# Patient Record
Sex: Female | Born: 1935 | Race: White | Hispanic: No | Marital: Married | Smoking: Never smoker
Health system: Southern US, Community
[De-identification: ages and names within clinical notes are randomized; demographics above are authoritative.]

## PROBLEM LIST (undated history)

## (undated) DIAGNOSIS — G459 Transient cerebral ischemic attack, unspecified: Secondary | ICD-10-CM

## (undated) DIAGNOSIS — J310 Chronic rhinitis: Secondary | ICD-10-CM

## (undated) DIAGNOSIS — D801 Nonfamilial hypogammaglobulinemia: Principal | ICD-10-CM

## (undated) DIAGNOSIS — K589 Irritable bowel syndrome without diarrhea: Secondary | ICD-10-CM

## (undated) DIAGNOSIS — D696 Thrombocytopenia, unspecified: Secondary | ICD-10-CM

## (undated) DIAGNOSIS — E785 Hyperlipidemia, unspecified: Secondary | ICD-10-CM

## (undated) DIAGNOSIS — K219 Gastro-esophageal reflux disease without esophagitis: Secondary | ICD-10-CM

## (undated) DIAGNOSIS — R410 Disorientation, unspecified: Principal | ICD-10-CM

## (undated) DIAGNOSIS — C9590 Leukemia, unspecified not having achieved remission: Secondary | ICD-10-CM

## (undated) DIAGNOSIS — D72819 Decreased white blood cell count, unspecified: Secondary | ICD-10-CM

## (undated) DIAGNOSIS — C9110 Chronic lymphocytic leukemia of B-cell type not having achieved remission: Principal | ICD-10-CM

## (undated) DIAGNOSIS — I10 Essential (primary) hypertension: Secondary | ICD-10-CM

## (undated) HISTORY — DX: Nonfamilial hypogammaglobulinemia: D80.1

## (undated) HISTORY — DX: Transient cerebral ischemic attack, unspecified: G45.9

## (undated) HISTORY — PX: VAGINAL HYSTERECTOMY: SUR661

## (undated) HISTORY — DX: Chronic lymphocytic leukemia of B-cell type not having achieved remission: C91.10

## (undated) HISTORY — DX: Thrombocytopenia, unspecified: D69.6

## (undated) HISTORY — PX: OTHER SURGICAL HISTORY: SHX169

## (undated) HISTORY — DX: Gastro-esophageal reflux disease without esophagitis: K21.9

## (undated) HISTORY — PX: ANTERIOR AND POSTERIOR VAGINAL REPAIR: SUR5

## (undated) HISTORY — PX: TONSILLECTOMY AND ADENOIDECTOMY: SUR1326

## (undated) HISTORY — DX: Hyperlipidemia, unspecified: E78.5

## (undated) HISTORY — PX: HEMORRHOID SURGERY: SHX153

## (undated) HISTORY — DX: Essential (primary) hypertension: I10

## (undated) HISTORY — PX: LAPAROSCOPIC CHOLECYSTECTOMY: SUR755

## (undated) HISTORY — DX: Leukemia, unspecified not having achieved remission: C95.90

## (undated) HISTORY — DX: Chronic rhinitis: J31.0

## (undated) HISTORY — DX: Disorientation, unspecified: R41.0

## (undated) HISTORY — DX: Irritable bowel syndrome, unspecified: K58.9

## (undated) HISTORY — DX: Decreased white blood cell count, unspecified: D72.819

---

## 2003-04-11 ENCOUNTER — Encounter: Payer: Self-pay | Admitting: Internal Medicine

## 2003-04-11 ENCOUNTER — Encounter: Payer: Self-pay | Admitting: Gastroenterology

## 2004-06-18 ENCOUNTER — Encounter: Payer: Self-pay | Admitting: Internal Medicine

## 2004-06-18 ENCOUNTER — Encounter: Payer: Self-pay | Admitting: Gastroenterology

## 2006-01-02 ENCOUNTER — Ambulatory Visit (HOSPITAL_COMMUNITY): Admission: RE | Admit: 2006-01-02 | Discharge: 2006-01-02 | Payer: Self-pay | Admitting: Internal Medicine

## 2007-01-05 ENCOUNTER — Ambulatory Visit (HOSPITAL_COMMUNITY): Admission: RE | Admit: 2007-01-05 | Discharge: 2007-01-05 | Payer: Self-pay | Admitting: Internal Medicine

## 2008-01-07 ENCOUNTER — Ambulatory Visit (HOSPITAL_COMMUNITY): Admission: RE | Admit: 2008-01-07 | Discharge: 2008-01-07 | Payer: Self-pay | Admitting: Internal Medicine

## 2009-01-10 ENCOUNTER — Ambulatory Visit (HOSPITAL_COMMUNITY): Admission: RE | Admit: 2009-01-10 | Discharge: 2009-01-10 | Payer: Self-pay | Admitting: Internal Medicine

## 2009-08-01 ENCOUNTER — Ambulatory Visit: Payer: Self-pay | Admitting: Pulmonary Disease

## 2009-08-01 ENCOUNTER — Inpatient Hospital Stay (HOSPITAL_COMMUNITY): Admission: AD | Admit: 2009-08-01 | Discharge: 2009-08-07 | Payer: Self-pay | Admitting: Internal Medicine

## 2009-08-03 ENCOUNTER — Encounter: Payer: Self-pay | Admitting: Pulmonary Disease

## 2009-08-06 ENCOUNTER — Encounter: Payer: Self-pay | Admitting: Pulmonary Disease

## 2009-08-06 ENCOUNTER — Ambulatory Visit: Payer: Self-pay | Admitting: Oncology

## 2009-08-06 ENCOUNTER — Encounter (HOSPITAL_BASED_OUTPATIENT_CLINIC_OR_DEPARTMENT_OTHER): Payer: Self-pay | Admitting: Internal Medicine

## 2009-08-06 DIAGNOSIS — C9590 Leukemia, unspecified not having achieved remission: Secondary | ICD-10-CM

## 2009-08-06 HISTORY — DX: Leukemia, unspecified not having achieved remission: C95.90

## 2009-08-08 ENCOUNTER — Ambulatory Visit: Payer: Self-pay | Admitting: Oncology

## 2009-08-13 ENCOUNTER — Encounter: Payer: Self-pay | Admitting: Pulmonary Disease

## 2009-08-13 LAB — COMPREHENSIVE METABOLIC PANEL
Albumin: 4.2 g/dL (ref 3.5–5.2)
BUN: 18 mg/dL (ref 6–23)
CO2: 26 mEq/L (ref 19–32)
Calcium: 10.4 mg/dL (ref 8.4–10.5)
Chloride: 98 mEq/L (ref 96–112)
Creatinine, Ser: 1.06 mg/dL (ref 0.40–1.20)
Potassium: 4 mEq/L (ref 3.5–5.3)

## 2009-08-13 LAB — LACTATE DEHYDROGENASE: LDH: 170 U/L (ref 94–250)

## 2009-08-13 LAB — CBC WITH DIFFERENTIAL/PLATELET
Basophils Absolute: 1 10*3/uL — ABNORMAL HIGH (ref 0.0–0.1)
Eosinophils Absolute: 0.1 10*3/uL (ref 0.0–0.5)
HCT: 39 % (ref 34.8–46.6)
HGB: 12.9 g/dL (ref 11.6–15.9)
LYMPH%: 68.3 % — ABNORMAL HIGH (ref 14.0–49.7)
MCHC: 32.9 g/dL (ref 31.5–36.0)
MONO#: 0.8 10*3/uL (ref 0.1–0.9)
NEUT#: 25.2 10*3/uL — ABNORMAL HIGH (ref 1.5–6.5)
NEUT%: 29.4 % — ABNORMAL LOW (ref 38.4–76.8)
Platelets: 329 10*3/uL (ref 145–400)
WBC: 85.6 10*3/uL (ref 3.9–10.3)
lymph#: 58.5 10*3/uL — ABNORMAL HIGH (ref 0.9–3.3)

## 2009-08-20 LAB — CBC WITH DIFFERENTIAL/PLATELET
Basophils Absolute: 0.1 10*3/uL (ref 0.0–0.1)
EOS%: 1.3 % (ref 0.0–7.0)
Eosinophils Absolute: 0.5 10*3/uL (ref 0.0–0.5)
HGB: 11.9 g/dL (ref 11.6–15.9)
MCH: 27.1 pg (ref 25.1–34.0)
MONO%: 4.1 % (ref 0.0–14.0)
NEUT#: 9 10*3/uL — ABNORMAL HIGH (ref 1.5–6.5)
RBC: 4.38 10*6/uL (ref 3.70–5.45)
RDW: 17 % — ABNORMAL HIGH (ref 11.2–14.5)
lymph#: 26.1 10*3/uL — ABNORMAL HIGH (ref 0.9–3.3)

## 2009-08-27 LAB — CBC WITH DIFFERENTIAL/PLATELET
Basophils Absolute: 0.1 10*3/uL (ref 0.0–0.1)
Eosinophils Absolute: 0.7 10*3/uL — ABNORMAL HIGH (ref 0.0–0.5)
HGB: 12.4 g/dL (ref 11.6–15.9)
MCV: 83.6 fL (ref 79.5–101.0)
MONO#: 1.2 10*3/uL — ABNORMAL HIGH (ref 0.1–0.9)
MONO%: 4.1 % (ref 0.0–14.0)
NEUT#: 7.2 10*3/uL — ABNORMAL HIGH (ref 1.5–6.5)
RBC: 4.53 10*6/uL (ref 3.70–5.45)
RDW: 18.7 % — ABNORMAL HIGH (ref 11.2–14.5)
WBC: 28.3 10*3/uL — ABNORMAL HIGH (ref 3.9–10.3)

## 2009-08-27 LAB — TECHNOLOGIST REVIEW

## 2009-09-03 ENCOUNTER — Encounter: Payer: Self-pay | Admitting: Pulmonary Disease

## 2009-09-03 LAB — COMPREHENSIVE METABOLIC PANEL
ALT: 21 U/L (ref 0–35)
AST: 23 U/L (ref 0–37)
CO2: 27 mEq/L (ref 19–32)
Creatinine, Ser: 1.07 mg/dL (ref 0.40–1.20)
Total Bilirubin: 0.6 mg/dL (ref 0.3–1.2)

## 2009-09-03 LAB — IGG, IGA, IGM
IgG (Immunoglobin G), Serum: 364 mg/dL — ABNORMAL LOW (ref 694–1618)
IgM, Serum: 20 mg/dL — ABNORMAL LOW (ref 60–263)

## 2009-09-03 LAB — CBC WITH DIFFERENTIAL/PLATELET
BASO%: 0.4 % (ref 0.0–2.0)
LYMPH%: 66.8 % — ABNORMAL HIGH (ref 14.0–49.7)
MCHC: 32.8 g/dL (ref 31.5–36.0)
MCV: 83.9 fL (ref 79.5–101.0)
MONO%: 5.8 % (ref 0.0–14.0)
Platelets: 186 10*3/uL (ref 145–400)
RBC: 4.33 10*6/uL (ref 3.70–5.45)

## 2009-09-03 LAB — LACTATE DEHYDROGENASE: LDH: 146 U/L (ref 94–250)

## 2009-09-19 ENCOUNTER — Encounter: Admission: RE | Admit: 2009-09-19 | Discharge: 2009-09-19 | Payer: Self-pay | Admitting: Internal Medicine

## 2009-09-25 ENCOUNTER — Ambulatory Visit: Payer: Self-pay | Admitting: Oncology

## 2009-10-01 LAB — CBC WITH DIFFERENTIAL/PLATELET
Eosinophils Absolute: 0.5 10*3/uL (ref 0.0–0.5)
LYMPH%: 55.2 % — ABNORMAL HIGH (ref 14.0–49.7)
MONO#: 1.1 10*3/uL — ABNORMAL HIGH (ref 0.1–0.9)
NEUT#: 6.5 10*3/uL (ref 1.5–6.5)
Platelets: 213 10*3/uL (ref 145–400)
RBC: 4.46 10*6/uL (ref 3.70–5.45)
WBC: 18.2 10*3/uL — ABNORMAL HIGH (ref 3.9–10.3)
lymph#: 10.1 10*3/uL — ABNORMAL HIGH (ref 0.9–3.3)

## 2009-10-10 ENCOUNTER — Emergency Department (HOSPITAL_COMMUNITY): Admission: EM | Admit: 2009-10-10 | Discharge: 2009-10-10 | Payer: Self-pay | Admitting: Emergency Medicine

## 2009-11-05 ENCOUNTER — Ambulatory Visit: Payer: Self-pay | Admitting: Oncology

## 2009-11-08 ENCOUNTER — Encounter: Payer: Self-pay | Admitting: Pulmonary Disease

## 2009-11-08 ENCOUNTER — Ambulatory Visit: Payer: Self-pay | Admitting: Oncology

## 2009-11-08 LAB — COMPREHENSIVE METABOLIC PANEL
Alkaline Phosphatase: 67 U/L (ref 39–117)
BUN: 15 mg/dL (ref 6–23)
Calcium: 9.4 mg/dL (ref 8.4–10.5)
Chloride: 104 mEq/L (ref 96–112)
Glucose, Bld: 92 mg/dL (ref 70–99)
Potassium: 4.4 mEq/L (ref 3.5–5.3)
Sodium: 141 mEq/L (ref 135–145)
Total Bilirubin: 0.4 mg/dL (ref 0.3–1.2)

## 2009-11-08 LAB — LACTATE DEHYDROGENASE: LDH: 159 U/L (ref 94–250)

## 2009-11-08 LAB — CBC WITH DIFFERENTIAL/PLATELET
EOS%: 2.6 % (ref 0.0–7.0)
Eosinophils Absolute: 0.4 10*3/uL (ref 0.0–0.5)
LYMPH%: 52.8 % — ABNORMAL HIGH (ref 14.0–49.7)
MCHC: 33.2 g/dL (ref 31.5–36.0)
MONO#: 1.1 10*3/uL — ABNORMAL HIGH (ref 0.1–0.9)
MONO%: 7.1 % (ref 0.0–14.0)
NEUT#: 6 10*3/uL (ref 1.5–6.5)
WBC: 16.1 10*3/uL — ABNORMAL HIGH (ref 3.9–10.3)
lymph#: 8.5 10*3/uL — ABNORMAL HIGH (ref 0.9–3.3)

## 2009-12-17 ENCOUNTER — Ambulatory Visit: Payer: Self-pay | Admitting: Oncology

## 2009-12-20 LAB — CBC WITH DIFFERENTIAL/PLATELET
Basophils Absolute: 0.1 10*3/uL (ref 0.0–0.1)
Eosinophils Absolute: 0.3 10*3/uL (ref 0.0–0.5)
HCT: 40 % (ref 34.8–46.6)
HGB: 13.4 g/dL (ref 11.6–15.9)
LYMPH%: 58.8 % — ABNORMAL HIGH (ref 14.0–49.7)
MCHC: 33.4 g/dL (ref 31.5–36.0)
MONO#: 1.1 10*3/uL — ABNORMAL HIGH (ref 0.1–0.9)
NEUT#: 6.7 10*3/uL — ABNORMAL HIGH (ref 1.5–6.5)
NEUT%: 33.8 % — ABNORMAL LOW (ref 38.4–76.8)
Platelets: 146 10*3/uL (ref 145–400)
WBC: 19.9 10*3/uL — ABNORMAL HIGH (ref 3.9–10.3)

## 2010-01-11 ENCOUNTER — Ambulatory Visit (HOSPITAL_COMMUNITY): Admission: RE | Admit: 2010-01-11 | Discharge: 2010-01-11 | Payer: Self-pay | Admitting: Internal Medicine

## 2010-02-05 ENCOUNTER — Ambulatory Visit: Payer: Self-pay | Admitting: Oncology

## 2010-02-07 ENCOUNTER — Encounter (INDEPENDENT_AMBULATORY_CARE_PROVIDER_SITE_OTHER): Payer: Self-pay | Admitting: *Deleted

## 2010-02-07 ENCOUNTER — Encounter: Payer: Self-pay | Admitting: Pulmonary Disease

## 2010-02-07 LAB — CBC WITH DIFFERENTIAL/PLATELET
Basophils Absolute: 0.2 10*3/uL — ABNORMAL HIGH (ref 0.0–0.1)
Eosinophils Absolute: 0.2 10*3/uL (ref 0.0–0.5)
HCT: 37.9 % (ref 34.8–46.6)
HGB: 12.7 g/dL (ref 11.6–15.9)
MCH: 28.2 pg (ref 25.1–34.0)
MCV: 84.1 fL (ref 79.5–101.0)
MONO#: 1.1 10*3/uL — ABNORMAL HIGH (ref 0.1–0.9)
Platelets: 131 10*3/uL — ABNORMAL LOW (ref 145–400)
RBC: 4.51 10*6/uL (ref 3.70–5.45)
RDW: 17.1 % — ABNORMAL HIGH (ref 11.2–14.5)

## 2010-02-07 LAB — COMPREHENSIVE METABOLIC PANEL
Albumin: 4.5 g/dL (ref 3.5–5.2)
Alkaline Phosphatase: 45 U/L (ref 39–117)
CO2: 25 mEq/L (ref 19–32)
Calcium: 9.2 mg/dL (ref 8.4–10.5)
Chloride: 96 mEq/L (ref 96–112)
Sodium: 131 mEq/L — ABNORMAL LOW (ref 135–145)

## 2010-02-11 ENCOUNTER — Ambulatory Visit: Payer: Self-pay | Admitting: Internal Medicine

## 2010-02-12 ENCOUNTER — Ambulatory Visit: Payer: Self-pay | Admitting: Internal Medicine

## 2010-02-12 DIAGNOSIS — R195 Other fecal abnormalities: Secondary | ICD-10-CM | POA: Insufficient documentation

## 2010-02-12 DIAGNOSIS — K219 Gastro-esophageal reflux disease without esophagitis: Secondary | ICD-10-CM

## 2010-02-12 DIAGNOSIS — K589 Irritable bowel syndrome without diarrhea: Secondary | ICD-10-CM | POA: Insufficient documentation

## 2010-03-01 ENCOUNTER — Ambulatory Visit: Payer: Self-pay | Admitting: Internal Medicine

## 2010-03-20 ENCOUNTER — Ambulatory Visit: Payer: Self-pay | Admitting: Oncology

## 2010-03-21 ENCOUNTER — Encounter (INDEPENDENT_AMBULATORY_CARE_PROVIDER_SITE_OTHER): Payer: Self-pay | Admitting: *Deleted

## 2010-03-21 LAB — CBC WITH DIFFERENTIAL/PLATELET
BASO%: 0.9 % (ref 0.0–2.0)
Basophils Absolute: 0.2 10*3/uL — ABNORMAL HIGH (ref 0.0–0.1)
Eosinophils Absolute: 0.2 10*3/uL (ref 0.0–0.5)
HGB: 13.4 g/dL (ref 11.6–15.9)
LYMPH%: 72.6 % — ABNORMAL HIGH (ref 14.0–49.7)
MCHC: 34.2 g/dL (ref 31.5–36.0)
MONO#: 0.7 10*3/uL (ref 0.1–0.9)
NEUT#: 5.4 10*3/uL (ref 1.5–6.5)
NEUT%: 22.7 % — ABNORMAL LOW (ref 38.4–76.8)
Platelets: 137 10*3/uL — ABNORMAL LOW (ref 145–400)
RDW: 15.9 % — ABNORMAL HIGH (ref 11.2–14.5)
WBC: 23.6 10*3/uL — ABNORMAL HIGH (ref 3.9–10.3)
lymph#: 17.1 10*3/uL — ABNORMAL HIGH (ref 0.9–3.3)

## 2010-03-25 ENCOUNTER — Ambulatory Visit: Payer: Self-pay | Admitting: Internal Medicine

## 2010-04-04 ENCOUNTER — Ambulatory Visit: Payer: Self-pay | Admitting: Internal Medicine

## 2010-04-06 ENCOUNTER — Encounter: Payer: Self-pay | Admitting: Internal Medicine

## 2010-04-09 ENCOUNTER — Telehealth (INDEPENDENT_AMBULATORY_CARE_PROVIDER_SITE_OTHER): Payer: Self-pay | Admitting: *Deleted

## 2010-04-09 ENCOUNTER — Encounter (INDEPENDENT_AMBULATORY_CARE_PROVIDER_SITE_OTHER): Payer: Self-pay | Admitting: *Deleted

## 2010-04-09 DIAGNOSIS — R933 Abnormal findings on diagnostic imaging of other parts of digestive tract: Secondary | ICD-10-CM

## 2010-04-29 ENCOUNTER — Ambulatory Visit: Payer: Self-pay | Admitting: Oncology

## 2010-04-30 ENCOUNTER — Encounter: Payer: Self-pay | Admitting: Pulmonary Disease

## 2010-04-30 LAB — TECHNOLOGIST REVIEW

## 2010-04-30 LAB — COMPREHENSIVE METABOLIC PANEL
Albumin: 4.5 g/dL (ref 3.5–5.2)
Alkaline Phosphatase: 47 U/L (ref 39–117)
BUN: 16 mg/dL (ref 6–23)
CO2: 26 mEq/L (ref 19–32)
Calcium: 10 mg/dL (ref 8.4–10.5)
Potassium: 4.5 mEq/L (ref 3.5–5.3)
Total Bilirubin: 0.7 mg/dL (ref 0.3–1.2)

## 2010-04-30 LAB — CBC WITH DIFFERENTIAL/PLATELET
HGB: 13.8 g/dL (ref 11.6–15.9)
LYMPH%: 77 % — ABNORMAL HIGH (ref 14.0–49.7)
MCHC: 34.3 g/dL (ref 31.5–36.0)
MCV: 86.1 fL (ref 79.5–101.0)
MONO%: 4.3 % (ref 0.0–14.0)
NEUT%: 17.4 % — ABNORMAL LOW (ref 38.4–76.8)
Platelets: 123 10*3/uL — ABNORMAL LOW (ref 145–400)
RDW: 14.3 % (ref 11.2–14.5)

## 2010-04-30 LAB — LACTATE DEHYDROGENASE: LDH: 143 U/L (ref 94–250)

## 2010-06-07 ENCOUNTER — Ambulatory Visit: Payer: Self-pay | Admitting: Oncology

## 2010-06-11 LAB — CBC WITH DIFFERENTIAL/PLATELET
BASO%: 0.3 % (ref 0.0–2.0)
Basophils Absolute: 0.1 10*3/uL (ref 0.0–0.1)
EOS%: 0.8 % (ref 0.0–7.0)
HCT: 39.3 % (ref 34.8–46.6)
LYMPH%: 76.7 % — ABNORMAL HIGH (ref 14.0–49.7)
MCH: 30.1 pg (ref 25.1–34.0)
MCHC: 34.7 g/dL (ref 31.5–36.0)
MCV: 86.9 fL (ref 79.5–101.0)
MONO#: 1.4 10*3/uL — ABNORMAL HIGH (ref 0.1–0.9)
MONO%: 4.3 % (ref 0.0–14.0)
NEUT#: 5.7 10*3/uL (ref 1.5–6.5)
NEUT%: 17.9 % — ABNORMAL LOW (ref 38.4–76.8)
Platelets: 120 10*3/uL — ABNORMAL LOW (ref 145–400)
RBC: 4.53 10*6/uL (ref 3.70–5.45)
lymph#: 24.6 10*3/uL — ABNORMAL HIGH (ref 0.9–3.3)

## 2010-06-11 LAB — TECHNOLOGIST REVIEW

## 2010-08-06 ENCOUNTER — Ambulatory Visit: Payer: Self-pay | Admitting: Oncology

## 2010-08-08 ENCOUNTER — Encounter: Payer: Self-pay | Admitting: Pulmonary Disease

## 2010-08-08 LAB — COMPREHENSIVE METABOLIC PANEL
ALT: 18 U/L (ref 0–35)
AST: 24 U/L (ref 0–37)
Albumin: 4.3 g/dL (ref 3.5–5.2)
Alkaline Phosphatase: 43 U/L (ref 39–117)
BUN: 21 mg/dL (ref 6–23)
Calcium: 9.8 mg/dL (ref 8.4–10.5)
Total Bilirubin: 0.6 mg/dL (ref 0.3–1.2)
Total Protein: 5.5 g/dL — ABNORMAL LOW (ref 6.0–8.3)

## 2010-08-08 LAB — CBC WITH DIFFERENTIAL/PLATELET
BASO%: 0.4 % (ref 0.0–2.0)
EOS%: 0.8 % (ref 0.0–7.0)
HGB: 13.1 g/dL (ref 11.6–15.9)
MCHC: 33.8 g/dL (ref 31.5–36.0)
MONO#: 1.3 10*3/uL — ABNORMAL HIGH (ref 0.1–0.9)
lymph#: 23.3 10*3/uL — ABNORMAL HIGH (ref 0.9–3.3)

## 2010-11-05 ENCOUNTER — Encounter
Admission: RE | Admit: 2010-11-05 | Discharge: 2010-11-05 | Payer: Self-pay | Source: Home / Self Care | Attending: Obstetrics and Gynecology | Admitting: Obstetrics and Gynecology

## 2010-11-06 ENCOUNTER — Ambulatory Visit: Payer: Self-pay | Admitting: Oncology

## 2010-11-07 ENCOUNTER — Encounter: Payer: Self-pay | Admitting: Pulmonary Disease

## 2010-11-07 LAB — MANUAL DIFFERENTIAL
ALC: 32 10*3/uL — ABNORMAL HIGH (ref 0.9–3.3)
ANC (CHCC manual diff): 2.8 10*3/uL (ref 1.5–6.5)
Band Neutrophils: 0 % (ref 0–10)
Basophil: 0 % (ref 0–2)
Blasts: 0 % (ref 0–0)
EOS: 2 % (ref 0–7)
LYMPH: 90 % — ABNORMAL HIGH (ref 14–49)
MONO: 0 % (ref 0–14)
Metamyelocytes: 0 % (ref 0–0)
Myelocytes: 0 % (ref 0–0)
Other Cell: 0 % (ref 0–0)
PLT EST: DECREASED
PROMYELO: 0 % (ref 0–0)
SEG: 8 % — ABNORMAL LOW (ref 38–77)
Variant Lymph: 0 % (ref 0–0)
nRBC: 0 % (ref 0–0)

## 2010-11-07 LAB — CBC WITH DIFFERENTIAL/PLATELET
HCT: 39.3 % (ref 34.8–46.6)
HGB: 13.4 g/dL (ref 11.6–15.9)
MCH: 29.3 pg (ref 25.1–34.0)
MCHC: 34.1 g/dL (ref 31.5–36.0)
MCV: 86 fL (ref 79.5–101.0)
Platelets: 112 10*3/uL — ABNORMAL LOW (ref 145–400)
RBC: 4.57 10*6/uL (ref 3.70–5.45)
RDW: 13.9 % (ref 11.2–14.5)
WBC: 35.5 10*3/uL — ABNORMAL HIGH (ref 3.9–10.3)

## 2010-11-07 LAB — COMPREHENSIVE METABOLIC PANEL
ALT: 22 U/L (ref 0–35)
AST: 24 U/L (ref 0–37)
Albumin: 4.8 g/dL (ref 3.5–5.2)
Alkaline Phosphatase: 58 U/L (ref 39–117)
BUN: 20 mg/dL (ref 6–23)
CO2: 27 mEq/L (ref 19–32)
Calcium: 10.2 mg/dL (ref 8.4–10.5)
Chloride: 100 mEq/L (ref 96–112)
Creatinine, Ser: 1.01 mg/dL (ref 0.40–1.20)
Glucose, Bld: 84 mg/dL (ref 70–99)
Potassium: 4.1 mEq/L (ref 3.5–5.3)
Sodium: 138 mEq/L (ref 135–145)
Total Bilirubin: 0.7 mg/dL (ref 0.3–1.2)
Total Protein: 6.5 g/dL (ref 6.0–8.3)

## 2010-11-07 LAB — IGG, IGA, IGM
IgA: 8 mg/dL — ABNORMAL LOW (ref 68–378)
IgG (Immunoglobin G), Serum: 344 mg/dL — ABNORMAL LOW (ref 694–1618)
IgM, Serum: 11 mg/dL — ABNORMAL LOW (ref 60–263)

## 2010-11-07 LAB — LACTATE DEHYDROGENASE: LDH: 137 U/L (ref 94–250)

## 2010-12-03 NOTE — Letter (Signed)
Summary: EGD Instructions  [facility]  [address]   [place], [place] [address]   Phone: [phone]  Fax: [phone]       Angelica Sullivan    03/25/1933    MRN: [id number]       Procedure Day Dorna Bloom: Thursday, 04-04-10     Arrival Time:  10:00 a.m.     Procedure Time: 11:00 a.m.     Location of Procedure:                    x   [facility] (4th Floor)   PREPARATION FOR ENDOSCOPY   On 04-04-10  THE DAY OF THE PROCEDURE:  1.   No solid foods, milk or milk products are allowed after midnight the night before your procedure.  2.   Do not drink anything colored red or purple.  Avoid juices with pulp.  No orange juice.  3.  You may drink clear liquids until 9:00 a.m., which is 2 hours before your procedure.                                                                                                CLEAR LIQUIDS INCLUDE: Water Jello Ice Popsicles Tea (sugar ok, no milk/cream) Powdered fruit flavored drinks Coffee (sugar ok, no milk/cream) Gatorade Juice: apple, white grape, white cranberry  Lemonade Clear bullion, consomm, broth Carbonated beverages (any kind) Strained chicken noodle soup Hard Candy   MEDICATION INSTRUCTIONS  Unless otherwise instructed, you should take regular prescription medications with a small sip of water as early as possible the morning of your procedure.   Additional medication instructions: hold maxide morning of procedure             OTHER INSTRUCTIONS  You will need a responsible adult at least 75 years of age to accompany you and drive you home.   This person must remain in the waiting room during your procedure.  Wear loose fitting clothing that is easily removed.  Leave jewelry and other valuables at home.  However, you may wish to bring a book to read or an iPod/MP3 player to listen to music as you wait for your procedure to start.  Remove all body piercing jewelry and leave at home.  Total time  from sign-in until discharge is approximately 2-3 hours.  You should go home directly after your procedure and rest.  You can resume normal activities the day after your procedure.  The day of your procedure you should not:   Drive   Make legal decisions   Operate machinery   Drink alcohol   Return to work  You will receive specific instructions about eating, activities and medications before you leave.    The above instructions have been reviewed and explained to me by   Sherren Kerns RN  Mar 25, 2010 1:49 PM     I fully understand and can verbalize these instructions _____________________________ Date _________

## 2010-12-03 NOTE — Assessment & Plan Note (Signed)
Summary: heme positive stool   History of Present Illness Visit Type: Initial Visit Primary GI MD: Yancey Flemings MD Primary Provider: Creola Corn, MD Chief Complaint: heme positive stool History of Present Illness:   77 year old female with chronic lymphocytic leukemia, fibromyalgia, hypertension, hyperlipidemia, hypogammaglobulinemia, GERD, and irritable bowel. She said today regarding Hemoccult-positive stool identified as part of a routine physical examination January 23, 2000. Review of outside blood work from March 14 reveals a hemoglobin of 14.0. White blood cell count 14.5. Blood counts obtained February 07, 2010 revealed a hemoglobin of 12.7, white blood cell count 18.3, and platelets 131,000. The patient reports having had prior colonoscopies but none more recently than 2004. These were performed elsewhere. She denies any abnormalities. No records available to review. No prior upper endoscopy. Prior to submit her stool studies she did have some transient diarrhea which has resolved. Her IBS is been under good control. She does have regurgitation and pyrosis but is on no PPI therapy. She denies aspirin or significant NSAID use. She denies melena or hematochezia. No weight loss or abdominal pain. Her multiple chronic medical problems are stable.   GI Review of Systems    Reports acid reflux, bloating, loss of appetite, and  weight loss.      Denies abdominal pain, belching, chest pain, dysphagia with liquids, dysphagia with solids, heartburn, nausea, vomiting, vomiting blood, and  weight gain.      Reports diarrhea, heme positive stool, hemorrhoids, and  irritable bowel syndrome.     Denies anal fissure, black tarry stools, change in bowel habit, constipation, diverticulosis, fecal incontinence, jaundice, light color stool, liver problems, rectal bleeding, and  rectal pain. Preventive Screening-Counseling & Management  Alcohol-Tobacco     Smoking Status: never      Drug Use:  no.      Current  Medications (verified): 1)  Zolpidem Tartrate 10 Mg Tabs (Zolpidem Tartrate) .Marland Kitchen.. 1 Tablet By Mouth Once Daily 2)  Claritin 10 Mg Tabs (Loratadine) .Marland Kitchen.. 1 By Mouth As Needed 3)  Glucosamine-Chondroitin 1500-1200 Mg/75ml Liqd (Glucosamine-Chondroitin) .Marland Kitchen.. 1 By Mouth Once Daily 4)  Lipitor 20 Mg Tabs (Atorvastatin Calcium) .Marland Kitchen.. 1 Tablet By Mouth Once Daily 5)  Maxzide-25 37.5-25 Mg Tabs (Triamterene-Hctz) .Marland Kitchen.. 1 Tablet By Mouth Once Daily 6)  Multivitamins  Tabs (Multiple Vitamin) .Marland Kitchen.. 1 Tablet By Mouth Once Daily 7)  Fish Oil 1000 Mg Caps (Omega-3 Fatty Acids) .... 2 By Mouth Once Daily 8)  Singulair 10 Mg Tabs (Montelukast Sodium) .Marland Kitchen.. 1 Tablet By Mouth Once Daily 9)  Toprol Xl 25 Mg Xr24h-Tab (Metoprolol Succinate) .... 1/2 By Mouth Once Daily 10)  Vitamin E 400 Unit Caps (Vitamin E) .Marland Kitchen.. 1 By Mouth Once Daily 11)  Zanaflex 4 Mg Tabs (Tizanidine Hcl) .Marland Kitchen.. 1 By Mouth 3 Times Per Week As Needed  Allergies (verified): No Known Drug Allergies  Past History:  Past Medical History: CLL Hypogammaglobulinemia Fibromyalgia Hyperlipidemia Hypertension Irritable Bowel Syndrome Pneumonia Urinary Tract Infection  Past Surgical History: Cholecystectomy Hysterectomy  Family History: No FH of Colon Cancer: Family History of Liver Disease/Cirrhosis:Father Pancreatits: Mother  Social History: Occupation: Retired Engineer, civil (consulting) Patient has never smoked.  Alcohol Use - no Daily Caffeine Use Illicit Drug Use - no Smoking Status:  never Drug Use:  no  Review of Systems       The patient complains of allergy/sinus, cough, headaches-new, and sleeping problems.  The patient denies anemia, anxiety-new, arthritis/joint pain, back pain, blood in urine, breast changes/lumps, change in vision, confusion, coughing up blood,  depression-new, fainting, fatigue, fever, hearing problems, heart murmur, heart rhythm changes, itching, menstrual pain, muscle pains/cramps, night sweats, nosebleeds, pregnancy  symptoms, shortness of breath, skin rash, sore throat, swelling of feet/legs, swollen lymph glands, thirst - excessive , urination - excessive , urination changes/pain, urine leakage, vision changes, and voice change.    Vital Signs:  Patient profile:   77 year old female Height:      60 inches Weight:      136.25 pounds BMI:     26.71 Pulse rate:   68 / minute Pulse rhythm:   regular BP sitting:   152 / 58  (left arm) Cuff size:   regular  Vitals Entered By: June McMurray CMA Duncan Dull) (February 12, 2010 2:34 PM)  Physical Exam  General:  Well developed, well nourished, no acute distress. Head:  Normocephalic and atraumatic. Eyes:  PERRLA, no icterus. Nose:  No deformity, discharge,  or lesions. Mouth:  No deformity or lesions Neck:  Supple; no masses or thyromegaly. Lungs:  Clear throughout to auscultation. Heart:  Regular rate and rhythm; no murmurs, rubs,  or bruits. Abdomen:  Soft, nontender and nondistended. No masses, hepatosplenomegaly or hernias noted. Normal bowel sounds. Rectal:  deferred until colonoscopy Msk:  Symmetrical with no gross deformities. Normal posture. Pulses:  Normal pulses noted. Extremities:  No clubbing, cyanosis, edema or deformities noted. Neurologic:  Alert and  oriented x4;  grossly normal neurologically. Skin:  Intact without significant lesions or rashes. Cervical Nodes:  No significant cervical adenopathy.no supraclavicular adenopathy Psych:  Alert and cooperative. Normal mood and affect.   Impression & Recommendations:  Problem # 1:  FECAL OCCULT BLOOD (ICD-89.22) 77 year old with multiple medical problems who is sent for Hemoccult positive stool. GI review of systems remarkable for GERD and IBS. Reports negative colonoscopy around 2004.  Plan: #1. Colonoscopy to evaluate Hemoccult-positive stool. The nature of the procedure as well as the risks, benefits, and alternatives have been reviewed. She understood and agreed to proceed #2. Movi prep  prescribed. The patient instructed on its use #3. Obtain outside records from previous GI Dr. The patient has signed a release form. If these records arrive, I will review them. #4. Consider upper endoscopy if colonoscopy negative  Problem # 2:  GERD (ICD-530.81) intermittent GERD symptoms. On no regular acid suppressive therapy.  Plan: #1. Reflux precautions #2. Consider OTC H2 receptor antagonist or PPI on demand p.r.n. #3. Consider upper endoscopy if colonoscopy negative  Problem # 3:  IRRITABLE BOWEL SYNDROME (ICD-564.1) really no significant IBS-type symptoms of recent. Observation without additional recommendations at this time  Patient Instructions: 1)  Colonoscopy was already scheduled for 03/01/10 at 3:00 pm 2)  Movi prep Rx. was sent today to pt.'s pharmacy for pickup. 3)  The medication list was reviewed and reconciled.  All changed / newly prescribed medications were explained.  A complete medication list was provided to the patient / caregiver. 4)  copy: Dr. Creola Corn, Dr. Kimberlee Nearing Prescriptions: MOVIPREP 100 GM  SOLR (PEG-KCL-NACL-NASULF-NA ASC-C) As per prep instructions.  #1 x 0   Entered by:   Milford Cage NCMA   Authorized by:   Hilarie Fredrickson MD   Signed by:   Milford Cage NCMA on 02/12/2010   Method used:   Electronically to        [facility]  [address] [facility] [address] (retail)       [address]       [place], [place]  [address]  Ph: [phone] or [phone]       Fax: [phone]   RxID:   [id number]

## 2010-12-03 NOTE — Procedures (Signed)
Summary: [facility]   Imported By: Sherian Rein 04/26/2010 11:11:15  _____________________________________________________________________  External Attachment:    Type:   Image     Comment:   External Document

## 2010-12-03 NOTE — Procedures (Signed)
Summary: [facility]   Imported By: Lester Gentry 04/15/2010 07:44:58  _____________________________________________________________________  External Attachment:    Type:   Image     Comment:   External Document

## 2010-12-03 NOTE — Miscellaneous (Signed)
Summary: HEM + STOOLS/normal colon/previsit done/rm  Clinical Lists Changes  Observations: Added new observation of ALLERGY REV: Done (03/25/2010 12:58) Added new observation of NKA: T (03/25/2010 12:58)

## 2010-12-03 NOTE — Letter (Signed)
Summary: [facility]  [facility]   Imported By: Sherian Rein 09/06/2010 10:11:42  _____________________________________________________________________  External Attachment:    Type:   Image     Comment:   External Document

## 2010-12-03 NOTE — Letter (Signed)
Summary: [facility] Instructions  [facility]  [address] [place], [place] [address]   Phone: [phone]  Fax: [phone]       Angelica Sullivan    75/23/1934    MRN: [id number]        Procedure Day Dorna Bloom:  Farrell Ours  03/01/10     Arrival Time:  2:00PM     Procedure Time:  3:00PM     Location of Procedure:                    _X _  [facility] (4th Floor)                        PREPARATION FOR COLONOSCOPY WITH MOVIPREP   Starting 5 days prior to your procedure 02/24/10 do not eat nuts, seeds, popcorn, corn, beans, peas,  salads, or any raw vegetables.  Do not take any fiber supplements (e.g. Metamucil, Citrucel, and Benefiber).  THE DAY BEFORE YOUR PROCEDURE         DATE: 02/28/10  DAY: THURSDAY  1.  Drink clear liquids the entire day-NO SOLID FOOD  2.  Do not drink anything colored red or purple.  Avoid juices with pulp.  No orange juice.  3.  Drink at least 64 oz. (8 glasses) of fluid/clear liquids during the day to prevent dehydration and help the prep work efficiently.  CLEAR LIQUIDS INCLUDE: Water Jello Ice Popsicles Tea (sugar ok, no milk/cream) Powdered fruit flavored drinks Coffee (sugar ok, no milk/cream) Gatorade Juice: apple, white grape, white cranberry  Lemonade Clear bullion, consomm, broth Carbonated beverages (any kind) Strained chicken noodle soup Hard Candy                             4.  In the morning, mix first dose of MoviPrep solution:    Empty 1 Pouch A and 1 Pouch B into the disposable container    Add lukewarm drinking water to the top line of the container. Mix to dissolve    Refrigerate (mixed solution should be used within 24 hrs)  5.  Begin drinking the prep at 5:00 p.m. The MoviPrep container is divided by 4 marks.   Every 15 minutes drink the solution down to the next mark (approximately 8 oz) until the full liter is complete.   6.  Follow completed prep with 16 oz of clear liquid of your choice (Nothing  red or purple).  Continue to drink clear liquids until bedtime.  7.  Before going to bed, mix second dose of MoviPrep solution:    Empty 1 Pouch A and 1 Pouch B into the disposable container    Add lukewarm drinking water to the top line of the container. Mix to dissolve    Refrigerate  THE DAY OF YOUR PROCEDURE      DATE: 03/01/10  DAY: FRIDAY  Beginning at 10:00AM (5 hours before procedure):         1. Every 15 minutes, drink the solution down to the next mark (approx 8 oz) until the full liter is complete.  2. Follow completed prep with 16 oz. of clear liquid of your choice.    3. You may drink clear liquids until 1:00PM (2 HOURS BEFORE PROCEDURE).   MEDICATION INSTRUCTIONS  Unless otherwise instructed, you should take regular prescription medications with a small sip of water   as early as possible the morning  of your procedure.           OTHER INSTRUCTIONS  You will need a responsible adult at least 75 years of age to accompany you and drive you home.   This person must remain in the waiting room during your procedure.  Wear loose fitting clothing that is easily removed.  Leave jewelry and other valuables at home.  However, you may wish to bring a book to read or  an iPod/MP3 player to listen to music as you wait for your procedure to start.  Remove all body piercing jewelry and leave at home.  Total time from sign-in until discharge is approximately 2-3 hours.  You should go home directly after your procedure and rest.  You can resume normal activities the  day after your procedure.  The day of your procedure you should not:   Drive   Make legal decisions   Operate machinery   Drink alcohol   Return to work  You will receive specific instructions about eating, activities and medications before you leave.    The above instructions have been reviewed and explained to me by   Clide Cliff, RN______________________    I fully understand and can  verbalize these instructions _____________________________ Date _________

## 2010-12-03 NOTE — Letter (Signed)
Summary: EGD Instructions  [facility]  [address] [place], [place] [address]   Phone: [phone]  Fax: [phone]       Angelica Sullivan    [birth year]    MRN: [id number]       Procedure Day /Date:05/02/10  [name]     Arrival Time: 815 am     Procedure Time:1015 am     Location of Procedure:                     [facility] ( Outpatient Registration)    PREPARATION FOR ENDOSCOPY   On 05/02/10  THE DAY OF THE PROCEDURE:  1.   Nothing to eat or drink after midnight.                                                                                                  MEDICATION INSTRUCTIONS  Unless otherwise instructed, you should take regular prescription medications with a small sip of water as early as possible the morning of your procedure.             OTHER INSTRUCTIONS  You will need a responsible adult at least 75 years of age to accompany you and drive you home.   This person must remain in the waiting room during your procedure.  Wear loose fitting clothing that is easily removed.  Leave jewelry and other valuables at home.  However, you may wish to bring a book to read or an iPod/MP3 player to listen to music as you wait for your procedure to start.  Remove all body piercing jewelry and leave at home.  Total time from sign-in until discharge is approximately 2-3 hours.  You should go home directly after your procedure and rest.  You can resume normal activities the day after your procedure.  The day of your procedure you should not:   Drive   Make legal decisions   Operate machinery   Drink alcohol   Return to work  You will receive specific instructions about eating, activities and medications before you leave.    The above instructions have been reviewed and explained to me by   Chales Abrahams CMA Duncan Dull)  April 09, 2010 12:58 PM     I fully understand and can verbalize these instructions over the phone mailed to home Date  04/09/10

## 2010-12-03 NOTE — Procedures (Signed)
Summary: Upper Endoscopy  Patient: Angelica Sullivan Note: All result statuses are Final unless otherwise noted.  Tests: (1) Upper Endoscopy (EGD)   EGD Upper Endoscopy       DONE     [facility]     [address]     [place], [place]  [address]           ENDOSCOPY PROCEDURE REPORT           PATIENT:  Angelica Sullivan, Angelica Sullivan  MR#:  [id number]     BIRTHDATE:  1933/08/25, 77 yrs. old  GENDER:  female           ENDOSCOPIST:  Wilhemina Bonito. Eda Keys, MD     Referred by:  Creola Corn, M.D.           PROCEDURE DATE:  04/04/2010     PROCEDURE:  EGD with biopsy     ASA CLASS:  Class II     INDICATIONS:  GERD, hemeoccult positive stool (negative     colonoscopy  02-2010)           MEDICATIONS:   Fentanyl 50 mcg IV, Versed 7 mg IV     TOPICAL ANESTHETIC:  Exactacain Spray           DESCRIPTION OF PROCEDURE:   After the risks benefits and     alternatives of the procedure were thoroughly explained, informed     consent was obtained.  The LB GIF-H180 [id number] endoscope was     introduced through the mouth and advanced to the third portion of     the duodenum, without limitations.  The instrument was slowly     withdrawn as the mucosa was fully examined.     <<PROCEDUREIMAGES>>           1.5 cm submucosal nodule/mass in the proximal esophagus at 21cm     from the teeth.  Diffusely atrophic gastric mucosa, except for     erythematous patches of mucosa in the proximal stomach (biopsied).     The duodenal bulb was normal in appearance, as was the postbulbar     duodenum.    Retroflexed views revealed no abnormalities.    The     scope was then withdrawn from the patient and the procedure     completed.           COMPLICATIONS:  None           ENDOSCOPIC IMPRESSION:     1) Submucosal nodule/mass in the proximal esophagus     2) Atrophic gastric mucosa     3) Normal duodenum     4) Gerd           RECOMMENDATIONS:     1) Follow up biopsy results     2) Anti-reflux regimen to be followed     3)  EUS with Dr Christella Hartigan to evaluate submucosal esophageal lesion           ______________________________     Wilhemina Bonito. Eda Keys, MD           CC:  Creola Corn, MD, The Patient           n.     eSIGNED:   Wilhemina Bonito. Eda Keys at 04/04/2010 12:50 PM           Angelica Sullivan, [id number]  Note: An exclamation mark (!) indicates a result that was not dispersed into the flowsheet. Document Creation Date: 04/04/2010 12:50 PM _______________________________________________________________________  (1)  Order result status: Final Collection or observation date-time: 04/04/2010 12:41 Requested date-time:  Receipt date-time:  Reported date-time:  Referring Physician:   Ordering Physician: Fransico Setters [phone]) Specimen Source:  Source: Launa Grill Order Number: [id number] Lab site:   Appended Document: Upper Endoscopy patty, can you schedule him for upper EUS 60 min, radial +/- linear, next avail EUS thursday for proximal esophagus lesion.  Would like this with propofol (proximal esophagus is a technically difficult spot to FNA if that this needed).  Appended Document: Upper Endoscopy eus scheduled

## 2010-12-03 NOTE — Procedures (Signed)
Summary: Colonoscopy  Patient: [name] Note: All result statuses are Final unless otherwise noted.  Tests: (1) Colonoscopy (COL)   COL Colonoscopy           DONE     [facility]     [address]     [place], [place]  [address]           COLONOSCOPY PROCEDURE REPORT           PATIENT:  Angelica Sullivan, Angelica Sullivan  MR#:  [id number]     BIRTHDATE:  [birth year], 77 yrs. old  GENDER:  female     ENDOSCOPIST:  Wilhemina Bonito. Eda Keys, MD     REF. BY:  Creola Corn, M.D.     PROCEDURE DATE:  03/01/2010     PROCEDURE:  Diagnostic Colonoscopy     ASA CLASS:  Class II     INDICATIONS:  heme positive stool     MEDICATIONS:   Fentanyl 100 mcg IV, Versed 10 mg IV, Benadryl 25     mg IV           DESCRIPTION OF PROCEDURE:   After the risks benefits and     alternatives of the procedure were thoroughly explained, informed     consent was obtained.  Digital rectal exam was performed and     revealed no abnormalities.   The LB CF-H180AL [id number] endoscope     was introduced through the anus and advanced to the cecum, which     was identified by both the appendix and ileocecal valve, without     limitations.TIME TO CECUM = 3:18 MIN.The quality of the prep was     excellent, using MoviPrep.  The instrument was then slowly     withdrawn (T=5:58 MIN) as the colon was fully examined.     <<PROCEDUREIMAGES>>           FINDINGS:  A normal appearing cecum, ileocecal valve, and     appendiceal orifice were identified. The ascending, hepatic     flexure, transverse, splenic flexure, descending, sigmoid colon,     and rectum appeared unremarkable.  The terminal ileum appeared     normal.   Retroflexed views in the rectum revealed internal     hemorrhoids.    The scope was then withdrawn from the patient and     the procedure completed.           COMPLICATIONS:  None     ENDOSCOPIC IMPRESSION:     1) Normal colon     2) Normal terminal ileum     3) Internal hemorrhoids           RECOMMENDATIONS:     1)  [facility] "heme + stool and     GERD"           ______________________________     Wilhemina Bonito. Eda Keys, MD           CC:  Creola Corn, MD; The Patient           n.     eSIGNED:   Wilhemina Bonito. Eda Keys at 03/01/2010 03:47 PM           [name], [id number]  Note: An exclamation mark (!) indicates a result that was not dispersed into the flowsheet. Document Creation Date: 03/01/2010 3:48 PM _______________________________________________________________________  (1) Order result status: Final Collection or observation date-time: 03/01/2010 15:35 Requested date-time:  Receipt date-time:  Reported date-time:  Referring Physician:   Ordering Physician: Fransico Setters [phone]) Specimen Source:  Source: Launa Grill Order Number: [id number] Lab site:

## 2010-12-03 NOTE — Letter (Signed)
[facility]   Imported By: Sherian Rein 02/25/2010 10:50:20  _____________________________________________________________________  External Attachment:    Type:   Image     Comment:   External Document

## 2010-12-03 NOTE — Letter (Signed)
[facility]   Imported By: Sherian Rein 12/14/2009 15:34:27  _____________________________________________________________________  External Attachment:    Type:   Image     Comment:   External Document

## 2010-12-03 NOTE — Miscellaneous (Signed)
Summary: + hem stool..em  Clinical Lists Changes  Medications: Added new medication of MOVIPREP 100 GM  SOLR (PEG-KCL-NACL-NASULF-NA ASC-C) As directed - Signed Rx of MOVIPREP 100 GM  SOLR (PEG-KCL-NACL-NASULF-NA ASC-C) As directed;  #1 x 0;  Signed;  Entered by: Clide Cliff RN;  Authorized by: Hilarie Fredrickson MD;  Method used: [facility]  [place] [address], [place], [place]  [address], Ph: [phone] or [phone], Fax: [phone] Observations: Added new observation of ALLERGY REV: Done (02/11/2010 10:42)    Prescriptions: MOVIPREP 100 GM  SOLR (PEG-KCL-NACL-NASULF-NA ASC-C) As directed  #1 x 0   Entered by:   Clide Cliff RN   Authorized by:   Hilarie Fredrickson MD   Signed by:   Clide Cliff RN on 02/11/2010   Method used:   [facility]        [facility]  [place] [address] (retail)       [address]       [place], [place]  [address]       Ph: [phone] or [phone]       Fax: [phone]   RxID:   [id number]

## 2010-12-03 NOTE — Letter (Signed)
[facility]   Imported By: Lester Cearfoss 05/17/2010 08:41:12  _____________________________________________________________________  External Attachment:    Type:   Image     Comment:   External Document

## 2010-12-03 NOTE — Procedures (Signed)
Summary: EUS/[facility]   Imported By: Sherian Rein 04/26/2010 11:12:08  _____________________________________________________________________  External Attachment:    Type:   Image     Comment:   External Document

## 2010-12-03 NOTE — Letter (Signed)
Summary: Patient [facility] Biopsy Results  [facility]  [address] [place], [place] [address]   Phone: [phone]  Fax: [phone]        April 06, 2010 MRN: [id number]    [facility] [address] [place], [place]  [address]    Dear Ms. Jordahl,  I am pleased to inform you that the biopsies taken during your recent endoscopic examination did not show any significant abnormality, only mild gastritis as expected.  Additional information/recommendations:   __ Continue with the treatment plan as outlined on the day of your      exam. We will contact you regarding the scheduling of an endoscopic ultrasound with my partner, Dr. Rob Bunting.    Please call us if you are having persistent problems or have questions about your condition that have not been fully answered at this time.  Sincerely,  Hilarie Fredrickson MD  This letter has been electronically signed by your physician.  Appended Document: Patient Notice-Endo Biopsy Results letter mailed.

## 2010-12-03 NOTE — Procedures (Signed)
Summary: [facility]   Imported By: Lester [place] 04/15/2010 07:43:41  _____________________________________________________________________  External Attachment:    Type:   Image     Comment:   External Document

## 2010-12-03 NOTE — Progress Notes (Signed)
Summary: EUS  Phone Note Outgoing Call Call back at Home Phone [phone]   Call placed by: Chales Abrahams CMA Duncan Dull),  April 09, 2010 12:53 PM Summary of Call: pt scheduled for EUS review meds and insruct pt left message on machine to call back  Initial call taken by: Chales Abrahams CMA Duncan Dull),  April 09, 2010 12:55 PM  Follow-up for Phone Call        pt returned call and was given the appt date and time and she says she is unable to keep the appt.  She also says that she had an EUS in 2004 and 2005 at [facility] and does not think that she needs another one.  I asked her to bring those records for Dr Marina Goodell and Christella Hartigan to review and then a desicion would be made regarding a repeat EUS.  She agreed to to this, the appt has been left as is for now. Follow-up by: Chales Abrahams CMA Duncan Dull),  April 09, 2010 4:19 PM  Additional Follow-up for Phone Call Additional follow up Details #1::        if that's true, ok Additional Follow-up by: Hilarie Fredrickson MD,  April 10, 2010 8:52 AM  New Problems: NONSPECIFIC ABN FINDING RAD & OTH EXAM GI TRACT (ICD-793.4)   New Problems: NONSPECIFIC ABN FINDING RAD & OTH EXAM GI TRACT (ICD-793.4)  Appended Document: EUS The reports are on your desk, she had pathology but when she called [facility] they told her an employee removed alot of charts from the office and they are missing.  They are suppose to let her know in two weeks if hers was one of the charts stolen.  She is continuing to try and get those reports.  Also, if she dose need to have the repeat EUS on 05/02/10 will it be ok to travel immediately after the procedure?  Her husband would be driving.  Appended Document: EUS i think we should wait and see what happens with [facility] records.  If this lesion has been fully evaluated, deemed not to be a problem then we don't have to repeat.  Appended Document: EUS Dr Marina Goodell reviewed the records and does not feel it necessary to repeat the EUS.  I have cx her off the schedule  for 05/02/10 and called and made pt aware.  Appended Document: EUS ok

## 2010-12-05 NOTE — Letter (Signed)
Summary: [facility]  [facility]   Imported By: Sherian Rein 11/29/2010 07:54:30  _____________________________________________________________________  External Attachment:    Type:   Image     Comment:   External Document

## 2011-02-06 ENCOUNTER — Other Ambulatory Visit (HOSPITAL_COMMUNITY): Payer: Self-pay | Admitting: Oncology

## 2011-02-06 ENCOUNTER — Encounter (HOSPITAL_BASED_OUTPATIENT_CLINIC_OR_DEPARTMENT_OTHER): Payer: Medicare Other | Admitting: Oncology

## 2011-02-06 DIAGNOSIS — D696 Thrombocytopenia, unspecified: Secondary | ICD-10-CM

## 2011-02-06 DIAGNOSIS — C9110 Chronic lymphocytic leukemia of B-cell type not having achieved remission: Secondary | ICD-10-CM

## 2011-02-06 LAB — CBC WITH DIFFERENTIAL/PLATELET
BASO%: 0.4 % (ref 0.0–2.0)
Basophils Absolute: 0.1 10*3/uL (ref 0.0–0.1)
EOS%: 0.7 % (ref 0.0–7.0)
HCT: 38.9 % (ref 34.8–46.6)
LYMPH%: 84.2 % — ABNORMAL HIGH (ref 14.0–49.7)
MCH: 29.9 pg (ref 25.1–34.0)
MCHC: 33.5 g/dL (ref 31.5–36.0)
MCV: 89.3 fL (ref 79.5–101.0)
NEUT%: 12.8 % — ABNORMAL LOW (ref 38.4–76.8)
Platelets: 110 10*3/uL — ABNORMAL LOW (ref 145–400)

## 2011-02-06 LAB — IGG, IGA, IGM
IgA: <7 mg/dL — ABNORMAL LOW (ref 68–378)
IgA: 15 mg/dL — ABNORMAL LOW (ref 68–378)
IgG (Immunoglobin G), Serum: 260 mg/dL — ABNORMAL LOW (ref 694–1618)
IgG (Immunoglobin G), Serum: 371 mg/dL — ABNORMAL LOW (ref 694–1618)
IgM, Serum: 7 mg/dL — ABNORMAL LOW (ref 60–263)

## 2011-02-06 LAB — DIFFERENTIAL
Band Neutrophils: 0 % (ref 0–10)
Basophils Absolute: 0 10*3/uL (ref 0.0–0.1)
Basophils Relative: 0 % (ref 0–1)
Basophils Relative: 0 % (ref 0–1)
Basophils Relative: 1 % (ref 0–1)
Eosinophils Absolute: 0 10*3/uL (ref 0.0–0.7)
Eosinophils Absolute: 0.4 10*3/uL (ref 0.0–0.7)
Eosinophils Relative: 0 % (ref 0–5)
Eosinophils Relative: 1 % (ref 0–5)
Eosinophils Relative: 1 % (ref 0–5)
Lymphocytes Relative: 62 % — ABNORMAL HIGH (ref 12–46)
Lymphocytes Relative: 65 % — ABNORMAL HIGH (ref 12–46)
Lymphocytes Relative: 71 % — ABNORMAL HIGH (ref 12–46)
Lymphs Abs: 27 10*3/uL — ABNORMAL HIGH (ref 0.7–4.0)
Lymphs Abs: 32.1 10*3/uL — ABNORMAL HIGH (ref 0.7–4.0)
Monocytes Absolute: 0.6 10*3/uL (ref 0.1–1.0)
Monocytes Relative: 2 % — ABNORMAL LOW (ref 3–12)
Myelocytes: 0 %
Neutro Abs: 15.3 10*3/uL — ABNORMAL HIGH (ref 1.7–7.7)
Neutro Abs: 8.7 10*3/uL — ABNORMAL HIGH (ref 1.7–7.7)
Neutrophils Relative %: 23 % — ABNORMAL LOW (ref 43–77)
Neutrophils Relative %: 36 % — ABNORMAL LOW (ref 43–77)

## 2011-02-06 LAB — CBC
HCT: 30.6 % — ABNORMAL LOW (ref 36.0–46.0)
HCT: 31 % — ABNORMAL LOW (ref 36.0–46.0)
HCT: 35.1 % — ABNORMAL LOW (ref 36.0–46.0)
Hemoglobin: 10.1 g/dL — ABNORMAL LOW (ref 12.0–15.0)
Hemoglobin: 10.3 g/dL — ABNORMAL LOW (ref 12.0–15.0)
Hemoglobin: 10.3 g/dL — ABNORMAL LOW (ref 12.0–15.0)
Hemoglobin: 11.6 g/dL — ABNORMAL LOW (ref 12.0–15.0)
MCHC: 32.9 g/dL (ref 30.0–36.0)
MCHC: 33.9 g/dL (ref 30.0–36.0)
MCV: 82.4 fL (ref 78.0–100.0)
MCV: 82.9 fL (ref 78.0–100.0)
MCV: 83.1 fL (ref 78.0–100.0)
MCV: 83.3 fL (ref 78.0–100.0)
Platelets: 377 10*3/uL (ref 150–400)
RBC: 3.83 MIL/uL — ABNORMAL LOW (ref 3.87–5.11)
RDW: 15.1 % (ref 11.5–15.5)
RDW: 15.2 % (ref 11.5–15.5)
RDW: 15.5 % (ref 11.5–15.5)
WBC: 36.9 10*3/uL — ABNORMAL HIGH (ref 4.0–10.5)
WBC: 49.4 10*3/uL — ABNORMAL HIGH (ref 4.0–10.5)

## 2011-02-06 LAB — AFB CULTURE WITH SMEAR (NOT AT ARMC): Acid Fast Smear: NONE SEEN

## 2011-02-06 LAB — COMPREHENSIVE METABOLIC PANEL
ALT: 60 U/L — ABNORMAL HIGH (ref 0–35)
ALT: 64 U/L — ABNORMAL HIGH (ref 0–35)
AST: 24 U/L (ref 0–37)
AST: 30 U/L (ref 0–37)
AST: 57 U/L — ABNORMAL HIGH (ref 0–37)
Albumin: 2.2 g/dL — ABNORMAL LOW (ref 3.5–5.2)
Albumin: 2.3 g/dL — ABNORMAL LOW (ref 3.5–5.2)
Alkaline Phosphatase: 43 U/L (ref 39–117)
Alkaline Phosphatase: 87 U/L (ref 39–117)
Alkaline Phosphatase: 96 U/L (ref 39–117)
BUN: 26 mg/dL — ABNORMAL HIGH (ref 6–23)
BUN: 14 mg/dL (ref 6–23)
CO2: 24 mEq/L (ref 19–32)
Calcium: 9.7 mg/dL (ref 8.4–10.5)
Calcium: 9.3 mg/dL (ref 8.4–10.5)
Chloride: 102 mEq/L (ref 96–112)
Chloride: 108 mEq/L (ref 96–112)
Creatinine, Ser: 1.26 mg/dL — ABNORMAL HIGH (ref 0.40–1.20)
Creatinine, Ser: 0.8 mg/dL (ref 0.4–1.2)
Creatinine, Ser: 0.87 mg/dL (ref 0.4–1.2)
GFR calc Af Amer: >60 mL/min (ref >60)
GFR calc Af Amer: >60 mL/min (ref >60)
GFR calc non Af Amer: >60 mL/min (ref >60)
Glucose, Bld: 88 mg/dL (ref 70–99)
Glucose, Bld: 157 mg/dL — ABNORMAL HIGH (ref 70–99)
Potassium: 4.2 mEq/L (ref 3.5–5.1)
Sodium: 139 mEq/L (ref 135–145)
Sodium: 141 mEq/L (ref 135–145)
Total Bilirubin: 0.4 mg/dL (ref 0.3–1.2)
Total Bilirubin: 0.5 mg/dL (ref 0.3–1.2)
Total Protein: 4.8 g/dL — ABNORMAL LOW (ref 6.0–8.3)
Total Protein: 5.3 g/dL — ABNORMAL LOW (ref 6.0–8.3)

## 2011-02-06 LAB — VANCOMYCIN, TROUGH: Vancomycin Tr: 15.4 ug/mL (ref 10.0–20.0)

## 2011-02-06 LAB — FUNGUS CULTURE W SMEAR

## 2011-02-06 LAB — GLUCOSE, CAPILLARY
Glucose-Capillary: 115 mg/dL — ABNORMAL HIGH (ref 70–99)
Glucose-Capillary: 120 mg/dL — ABNORMAL HIGH (ref 70–99)
Glucose-Capillary: 125 mg/dL — ABNORMAL HIGH (ref 70–99)
Glucose-Capillary: 185 mg/dL — ABNORMAL HIGH (ref 70–99)
Glucose-Capillary: 82 mg/dL (ref 70–99)
Glucose-Capillary: 87 mg/dL (ref 70–99)

## 2011-02-06 LAB — FOLATE: Folate: >20 ng/mL

## 2011-02-06 LAB — CULTURE, RESPIRATORY W GRAM STAIN

## 2011-02-06 LAB — LEGIONELLA PROFILE(CULTURE+DFA/SMEAR)

## 2011-02-06 LAB — IRON AND TIBC
Saturation Ratios: 22 % (ref 20–55)
TIBC: 230 ug/dL — ABNORMAL LOW (ref 250–470)
UIBC: 180 ug/dL

## 2011-02-06 LAB — FERRITIN: Ferritin: 175 ng/mL (ref 10–291)

## 2011-02-06 LAB — LACTATE DEHYDROGENASE: LDH: 150 U/L (ref 94–250)

## 2011-02-06 LAB — RETICULOCYTES: Retic Ct Pct: 1.9 % (ref 0.4–3.1)

## 2011-02-07 LAB — CULTURE, BLOOD (ROUTINE X 2): Culture: NO GROWTH

## 2011-02-07 LAB — CBC
HCT: 30.9 % — ABNORMAL LOW (ref 36.0–46.0)
HCT: 31 % — ABNORMAL LOW (ref 36.0–46.0)
HCT: 32 % — ABNORMAL LOW (ref 36.0–46.0)
HCT: 33.3 % — ABNORMAL LOW (ref 36.0–46.0)
Hemoglobin: 10.2 g/dL — ABNORMAL LOW (ref 12.0–15.0)
MCHC: 32.9 g/dL (ref 30.0–36.0)
MCV: 82.2 fL (ref 78.0–100.0)
MCV: 82.6 fL (ref 78.0–100.0)
MCV: 82.6 fL (ref 78.0–100.0)
Platelets: 349 10*3/uL (ref 150–400)
Platelets: 399 10*3/uL (ref 150–400)
Platelets: 406 10*3/uL — ABNORMAL HIGH (ref 150–400)
RBC: 3.74 MIL/uL — ABNORMAL LOW (ref 3.87–5.11)
RDW: 14.5 % (ref 11.5–15.5)
RDW: 14.6 % (ref 11.5–15.5)
WBC: 31.4 10*3/uL — ABNORMAL HIGH (ref 4.0–10.5)

## 2011-02-07 LAB — COMPREHENSIVE METABOLIC PANEL
ALT: 40 U/L — ABNORMAL HIGH (ref 0–35)
ALT: 48 U/L — ABNORMAL HIGH (ref 0–35)
AST: 30 U/L (ref 0–37)
Albumin: 2.3 g/dL — ABNORMAL LOW (ref 3.5–5.2)
Albumin: 2.4 g/dL — ABNORMAL LOW (ref 3.5–5.2)
Alkaline Phosphatase: 118 U/L — ABNORMAL HIGH (ref 39–117)
BUN: 12 mg/dL (ref 6–23)
BUN: 13 mg/dL (ref 6–23)
CO2: 26 mEq/L (ref 19–32)
Calcium: 9.3 mg/dL (ref 8.4–10.5)
Calcium: 9.5 mg/dL (ref 8.4–10.5)
Chloride: 101 mEq/L (ref 96–112)
Creatinine, Ser: 0.82 mg/dL (ref 0.4–1.2)
Creatinine, Ser: 0.95 mg/dL (ref 0.4–1.2)
GFR calc non Af Amer: >60 mL/min (ref >60)
Glucose, Bld: 201 mg/dL — ABNORMAL HIGH (ref 70–99)
Potassium: 3.5 mEq/L (ref 3.5–5.1)
Sodium: 135 mEq/L (ref 135–145)
Total Bilirubin: 0.4 mg/dL (ref 0.3–1.2)
Total Protein: 5.7 g/dL — ABNORMAL LOW (ref 6.0–8.3)

## 2011-02-07 LAB — PATHOLOGIST SMEAR REVIEW

## 2011-02-07 LAB — DIFFERENTIAL
Basophils Absolute: 0 10*3/uL (ref 0.0–0.1)
Basophils Relative: 0 % (ref 0–1)
Eosinophils Absolute: 0 10*3/uL (ref 0.0–0.7)
Lymphs Abs: 16 10*3/uL — ABNORMAL HIGH (ref 0.7–4.0)
Neutro Abs: 16.6 10*3/uL — ABNORMAL HIGH (ref 1.7–7.7)

## 2011-02-07 LAB — LACTATE DEHYDROGENASE: LDH: 171 U/L (ref 94–250)

## 2011-02-07 LAB — EXPECTORATED SPUTUM ASSESSMENT W GRAM STAIN, RFLX TO RESP C

## 2011-02-07 LAB — RHEUMATOID FACTOR: Rhuematoid fact SerPl-aCnc: 20 IU/mL (ref 0–20)

## 2011-05-12 ENCOUNTER — Other Ambulatory Visit (HOSPITAL_COMMUNITY): Payer: Self-pay | Admitting: Oncology

## 2011-05-12 ENCOUNTER — Encounter (HOSPITAL_BASED_OUTPATIENT_CLINIC_OR_DEPARTMENT_OTHER): Payer: Medicare Other | Admitting: Oncology

## 2011-05-12 DIAGNOSIS — C9110 Chronic lymphocytic leukemia of B-cell type not having achieved remission: Secondary | ICD-10-CM

## 2011-05-12 DIAGNOSIS — D801 Nonfamilial hypogammaglobulinemia: Secondary | ICD-10-CM

## 2011-05-12 DIAGNOSIS — D696 Thrombocytopenia, unspecified: Secondary | ICD-10-CM

## 2011-05-12 DIAGNOSIS — J329 Chronic sinusitis, unspecified: Secondary | ICD-10-CM

## 2011-05-12 LAB — CBC WITH DIFFERENTIAL/PLATELET
EOS%: 0.4 % (ref 0.0–7.0)
Eosinophils Absolute: 0.2 10*3/uL (ref 0.0–0.5)
MCV: 90.7 fL (ref 79.5–101.0)
MONO%: 4.7 % (ref 0.0–14.0)
NEUT#: 6 10*3/uL (ref 1.5–6.5)
RBC: 4.26 10*6/uL (ref 3.70–5.45)
RDW: 13.5 % (ref 11.2–14.5)

## 2011-05-12 LAB — LACTATE DEHYDROGENASE: LDH: 147 U/L (ref 94–250)

## 2011-05-12 LAB — COMPREHENSIVE METABOLIC PANEL
ALT: 18 U/L (ref 0–35)
Alkaline Phosphatase: 59 U/L (ref 39–117)
Sodium: 132 mEq/L — ABNORMAL LOW (ref 135–145)
Total Bilirubin: 0.6 mg/dL (ref 0.3–1.2)
Total Protein: 6 g/dL (ref 6.0–8.3)

## 2011-06-05 ENCOUNTER — Other Ambulatory Visit: Payer: Self-pay | Admitting: Internal Medicine

## 2011-06-05 DIAGNOSIS — J329 Chronic sinusitis, unspecified: Secondary | ICD-10-CM

## 2011-06-06 ENCOUNTER — Ambulatory Visit
Admission: RE | Admit: 2011-06-06 | Discharge: 2011-06-06 | Disposition: A | Discharge status: Home / Self Care | Payer: Medicare Other | Source: Ambulatory Visit | Attending: Internal Medicine | Admitting: Internal Medicine

## 2011-06-06 DIAGNOSIS — J329 Chronic sinusitis, unspecified: Secondary | ICD-10-CM

## 2011-08-12 ENCOUNTER — Encounter (HOSPITAL_BASED_OUTPATIENT_CLINIC_OR_DEPARTMENT_OTHER): Payer: Medicare Other | Admitting: Oncology

## 2011-08-12 ENCOUNTER — Other Ambulatory Visit (HOSPITAL_COMMUNITY): Payer: Self-pay | Admitting: Oncology

## 2011-08-12 ENCOUNTER — Ambulatory Visit (HOSPITAL_COMMUNITY)
Admission: RE | Admit: 2011-08-12 | Discharge: 2011-08-12 | Disposition: A | Discharge status: Home / Self Care | Payer: Medicare Other | Source: Ambulatory Visit | Attending: Oncology | Admitting: Oncology

## 2011-08-12 DIAGNOSIS — Z859 Personal history of malignant neoplasm, unspecified: Secondary | ICD-10-CM | POA: Insufficient documentation

## 2011-08-12 DIAGNOSIS — D696 Thrombocytopenia, unspecified: Secondary | ICD-10-CM

## 2011-08-12 DIAGNOSIS — J329 Chronic sinusitis, unspecified: Secondary | ICD-10-CM

## 2011-08-12 DIAGNOSIS — Z8701 Personal history of pneumonia (recurrent): Secondary | ICD-10-CM | POA: Insufficient documentation

## 2011-08-12 DIAGNOSIS — C801 Malignant (primary) neoplasm, unspecified: Secondary | ICD-10-CM

## 2011-08-12 DIAGNOSIS — C9110 Chronic lymphocytic leukemia of B-cell type not having achieved remission: Secondary | ICD-10-CM

## 2011-08-12 DIAGNOSIS — D801 Nonfamilial hypogammaglobulinemia: Secondary | ICD-10-CM

## 2011-08-12 LAB — CBC WITH DIFFERENTIAL/PLATELET
BASO%: 0.3 % (ref 0.0–2.0)
Basophils Absolute: 0.2 10*3/uL — ABNORMAL HIGH (ref 0.0–0.1)
Eosinophils Absolute: 0.4 10*3/uL (ref 0.0–0.5)
HCT: 38.4 % (ref 34.8–46.6)
HGB: 12.8 g/dL (ref 11.6–15.9)
LYMPH%: 83.5 % — ABNORMAL HIGH (ref 14.0–49.7)
MONO#: 2.1 10*3/uL — ABNORMAL HIGH (ref 0.1–0.9)
NEUT#: 6.6 10*3/uL — ABNORMAL HIGH (ref 1.5–6.5)
NEUT%: 11.8 % — ABNORMAL LOW (ref 38.4–76.8)
Platelets: 143 10*3/uL — ABNORMAL LOW (ref 145–400)
WBC: 56.2 10*3/uL (ref 3.9–10.3)
lymph#: 46.9 10*3/uL — ABNORMAL HIGH (ref 0.9–3.3)

## 2011-08-13 LAB — IGG, IGA, IGM
IgA: 7 mg/dL — ABNORMAL LOW (ref 69–380)
IgM, Serum: 5 mg/dL — ABNORMAL LOW (ref 52–322)

## 2011-08-13 LAB — COMPREHENSIVE METABOLIC PANEL
ALT: 20 U/L (ref 0–35)
AST: 24 U/L (ref 0–37)
Alkaline Phosphatase: 56 U/L (ref 39–117)
BUN: 20 mg/dL (ref 6–23)
Creatinine, Ser: 1.02 mg/dL (ref 0.50–1.10)
Total Bilirubin: 0.5 mg/dL (ref 0.3–1.2)

## 2011-09-11 ENCOUNTER — Encounter: Payer: Self-pay | Admitting: Medical Oncology

## 2011-09-15 ENCOUNTER — Other Ambulatory Visit (HOSPITAL_BASED_OUTPATIENT_CLINIC_OR_DEPARTMENT_OTHER): Payer: Medicare Other | Admitting: Lab

## 2011-09-15 ENCOUNTER — Ambulatory Visit (HOSPITAL_BASED_OUTPATIENT_CLINIC_OR_DEPARTMENT_OTHER): Payer: Medicare Other | Admitting: Oncology

## 2011-09-15 ENCOUNTER — Encounter: Payer: Self-pay | Admitting: Oncology

## 2011-09-15 ENCOUNTER — Telehealth: Payer: Self-pay | Admitting: Oncology

## 2011-09-15 ENCOUNTER — Other Ambulatory Visit (HOSPITAL_COMMUNITY): Payer: Self-pay | Admitting: Oncology

## 2011-09-15 VITALS — BP 137/63 | HR 68 | Temp 97.8°F | Ht 60.0 in | Wt 133.6 lb

## 2011-09-15 DIAGNOSIS — D696 Thrombocytopenia, unspecified: Secondary | ICD-10-CM

## 2011-09-15 DIAGNOSIS — D801 Nonfamilial hypogammaglobulinemia: Secondary | ICD-10-CM

## 2011-09-15 DIAGNOSIS — J310 Chronic rhinitis: Secondary | ICD-10-CM

## 2011-09-15 DIAGNOSIS — C9110 Chronic lymphocytic leukemia of B-cell type not having achieved remission: Secondary | ICD-10-CM

## 2011-09-15 HISTORY — DX: Thrombocytopenia, unspecified: D69.6

## 2011-09-15 HISTORY — DX: Chronic lymphocytic leukemia of B-cell type not having achieved remission: C91.10

## 2011-09-15 HISTORY — DX: Chronic rhinitis: J31.0

## 2011-09-15 LAB — COMPREHENSIVE METABOLIC PANEL
ALT: 22 U/L (ref 0–35)
Albumin: 4.3 g/dL (ref 3.5–5.2)
CO2: 26 mEq/L (ref 19–32)
Calcium: 10 mg/dL (ref 8.4–10.5)
Chloride: 101 mEq/L (ref 96–112)
Glucose, Bld: 108 mg/dL — ABNORMAL HIGH (ref 70–99)
Sodium: 136 mEq/L (ref 135–145)
Total Bilirubin: 0.5 mg/dL (ref 0.3–1.2)
Total Protein: 5.6 g/dL — ABNORMAL LOW (ref 6.0–8.3)

## 2011-09-15 LAB — MANUAL DIFFERENTIAL
ALC: 52.5 10*3/uL — ABNORMAL HIGH (ref 0.9–3.3)
ANC (CHCC manual diff): 1.7 10*3/uL (ref 1.5–6.5)
Blasts: 0 % (ref 0–0)
LYMPH: 94 % — ABNORMAL HIGH (ref 14–49)
MONO: 1 % (ref 0–14)
Metamyelocytes: 0 % (ref 0–0)
Myelocytes: 0 % (ref 0–0)
Other Cell: 0 % (ref 0–0)
SEG: 3 % — ABNORMAL LOW (ref 38–77)
Variant Lymph: 0 % (ref 0–0)

## 2011-09-15 LAB — CBC WITH DIFFERENTIAL/PLATELET
HCT: 37.6 % (ref 34.8–46.6)
Platelets: 91 10*3/uL — ABNORMAL LOW (ref 145–400)
WBC: 55.8 10*3/uL (ref 3.9–10.3)

## 2011-09-15 LAB — LACTATE DEHYDROGENASE: LDH: 156 U/L (ref 94–250)

## 2011-09-15 NOTE — Progress Notes (Signed)
This office note has been dictated.  [id number]

## 2011-09-15 NOTE — Progress Notes (Signed)
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  Angelica Sullivan was accompanied today by her husband, [name].  She was last seen by me on 08/12/2011.  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  The patient had bilateral patchy infiltrates.  I believe the patient had an elevated white count, suspected CLL that predated the onset even extending 2 or 3 years prior to that, i.e., 4-5 years ago.  Aside from the hypergammaglobulinemia, the patient started to develop mild thrombocytopenia as of April 2011.  She bruises easily with trauma.  The patient's main problem has been apparently chronic rhinitis under the care of Dr. Jenne Pane.  The patient was having lots of problems as listed in my note of 08/12/2011.  I am happy to say that over the past few weeks the patient's condition has improved dramatically, approximately 90% by her estimate.  She tells me that she has been using copious nasal rinses, which she thinks have helped enormously.  She also uses Bactroban.  She used to have very thick nasal drainage, but this has improved, although she still describes it as thick,  but much, much improved.  She is able to breathe through her nose for the first time in about 6 or 7 months.  She denies any fever or night sweats.  She is up- to-date on her flu shot, which she had in September.  She apparently had Pneumovax in the spring of 2011.  Angelica Sullivan asked me about whooping cough vaccine.  I checked this with our pharmacy and although this can be given to individuals even over the age of 20, apparently this is a live vaccine.  I am a little concerned about possible risks of this vaccine and possible lack of benefit given the  patient's fairly severe hypogammaglobulinemia.  The patient has no changes in her condition.  I am not aware of any significant changes in medicines, which we have reviewed here.  PHYSICAL EXAMINATION:  General Appearance:  The patient looks generally well.  The patient denies any fever or night sweats.  She does bruise easily.  Vital Signs:  Her weight is stable at 133 pounds and 10 ounces. Height is 5 feet even.  Body surface area is 1.6 m2.  Blood pressure today 137/63.  Other vital signs are normal.  She is afebrile.  HEENT: There is no scleral icterus.  Mouth and pharynx are benign.  The patient sounds much clearer in terms of her voice tone and breathing.  She does not sound congested at this time.  There is a very flat, barely palpable lymph node in the right mid neck.  I did not appreciate any other significant adenopathy.  Lungs:  Initial squeak in the left lung on inspiration, otherwise clear.  Cardiac Exam:  Regular rhythm without murmur or rub.  In the past, I have heard a systolic ejection murmur. Abdomen:  Benign with no organomegaly or masses palpable.  Extremities: No peripheral edema.  As I had noted before, she has purpura over her hands, which she associates with trauma.  LABORATORY DATA TODAY:  White count 55.8, ANC is 1.7 (down from 2.8 previously).  White count is stable.  Hemoglobin 12.3, hematocrit 37.6, platelets are 91,000 (down from 143,000 on 08/12/2011  and 107,000 on 05/12/2011).  Absolute lymphocyte count today was 52.5, as compared with 32 back on 11/07/2010.  There are 3% polys, 94% lymphs.  Chemistries are still pending.  Chemistries from 08/12/2011 were normal.  Chest x-ray from 08/12/2011 showed no active cardiopulmonary disease.  Quantitative immunoglobulins continue to show marked decrease in the immunoglobulin, specifically IgG level was 210, IgA 7, and IgM 5.  The IgG level continues to decrease.  On 02/06/2011, the IgG level had been  260.  IMPRESSION AND PLAN:  Clinically, Angelica Sullivan is much improved.  We discussed the whooping cough vaccine, which I do not favor nor do I see a good indication for it.  The risks may outweigh the benefits.  The patient may have an inadequate response.  The patient has thrombocytopenia, which has been fluctuating, but the trend has been somewhat downward.  I do not think this needs to be treated at the present time.  The patient does have large platelets on peripheral smear, which suggests peripheral destruction, i.e., immune mediated thrombocytopenia.  That is fairly common in patients with chronic lymphocytic leukemia.  The patient is having some bruising.  I have talked to Angelica Sullivan about continuing her nasal rinses.  I think she is about to run out of that solution.  She tells me that she has an appointment to see Dr. Jenne Pane on or about 09/30/2011, but she will be running out of her nasal rinses by then.  I suggested that she check with him to be sure that is his intent to have her off the nasal rinses, which she is using twice a day.  As stated in my last note, if the patient continues to have ongoing problems with her rhinitis and sinusitis, especially if she seems to be having remissions and then exacerbations, we can consider a trial of intravenous immunoglobulin.  We are going to check CBC in 3 weeks, which should be around 09/29/2011 and I will plan to see Angelica Sullivan again around 10/23/2011 at which time we will check CBC and chemistries.    ______________________________ Samul Dada, M.D. DSM/MEDQ  D:  09/15/2011  T:  09/15/2011  Job:  [id number]

## 2011-09-15 NOTE — Telephone Encounter (Signed)
gv pt appts for 12/6 and 12/21

## 2011-10-09 ENCOUNTER — Other Ambulatory Visit (HOSPITAL_COMMUNITY): Payer: Self-pay | Admitting: Oncology

## 2011-10-09 ENCOUNTER — Other Ambulatory Visit (HOSPITAL_BASED_OUTPATIENT_CLINIC_OR_DEPARTMENT_OTHER): Payer: Medicare Other | Admitting: Lab

## 2011-10-09 DIAGNOSIS — C9110 Chronic lymphocytic leukemia of B-cell type not having achieved remission: Secondary | ICD-10-CM

## 2011-10-09 LAB — CBC WITH DIFFERENTIAL/PLATELET
HCT: 39.5 % (ref 34.8–46.6)
HGB: 12.8 g/dL (ref 11.6–15.9)
MCH: 29.3 pg (ref 25.1–34.0)
MCHC: 32.3 g/dL (ref 31.5–36.0)
MCV: 90.7 fL (ref 79.5–101.0)
Platelets: 88 10*3/uL — ABNORMAL LOW (ref 145–400)
RBC: 4.36 10*6/uL (ref 3.70–5.45)
RDW: 14.5 % (ref 11.2–14.5)
WBC: 66 10*3/uL (ref 3.9–10.3)

## 2011-10-09 LAB — MANUAL DIFFERENTIAL
ALC: 60.1 10*3/uL — ABNORMAL HIGH (ref 0.9–3.3)
ANC (CHCC manual diff): 5.3 10*3/uL (ref 1.5–6.5)
Band Neutrophils: 0 % (ref 0–10)
Basophil: 1 % (ref 0–2)
Blasts: 0 % (ref 0–0)
EOS: 0 % (ref 0–7)
LYMPH: 91 % — ABNORMAL HIGH (ref 14–49)
MONO: 0 % (ref 0–14)
Metamyelocytes: 0 % (ref 0–0)
Myelocytes: 0 % (ref 0–0)
Other Cell: 0 % (ref 0–0)
PLT EST: DECREASED
PROMYELO: 0 % (ref 0–0)
SEG: 8 % — ABNORMAL LOW (ref 38–77)
Variant Lymph: 0 % (ref 0–0)
nRBC: 0 % (ref 0–0)

## 2011-10-23 ENCOUNTER — Telehealth: Payer: Self-pay | Admitting: Oncology

## 2011-10-23 ENCOUNTER — Other Ambulatory Visit (HOSPITAL_BASED_OUTPATIENT_CLINIC_OR_DEPARTMENT_OTHER): Payer: Medicare Other | Admitting: Lab

## 2011-10-23 ENCOUNTER — Other Ambulatory Visit (HOSPITAL_COMMUNITY): Payer: Self-pay | Admitting: Oncology

## 2011-10-23 ENCOUNTER — Ambulatory Visit (HOSPITAL_BASED_OUTPATIENT_CLINIC_OR_DEPARTMENT_OTHER): Payer: Medicare Other | Admitting: Oncology

## 2011-10-23 VITALS — BP 129/64 | HR 62 | Temp 96.8°F | Ht 60.0 in | Wt 132.6 lb

## 2011-10-23 DIAGNOSIS — C9110 Chronic lymphocytic leukemia of B-cell type not having achieved remission: Secondary | ICD-10-CM

## 2011-10-23 DIAGNOSIS — D696 Thrombocytopenia, unspecified: Secondary | ICD-10-CM

## 2011-10-23 LAB — CBC WITH DIFFERENTIAL/PLATELET
Basophils Absolute: 1.5 10*3/uL — ABNORMAL HIGH (ref 0.0–0.1)
Eosinophils Absolute: 0.4 10*3/uL (ref 0.0–0.5)
HGB: 12.3 g/dL (ref 11.6–15.9)
LYMPH%: 89.5 % — ABNORMAL HIGH (ref 14.0–49.7)
MCV: 89.9 fL (ref 79.5–101.0)
MONO#: 1.1 10*3/uL — ABNORMAL HIGH (ref 0.1–0.9)
NEUT#: 4.6 10*3/uL (ref 1.5–6.5)
Platelets: 89 10*3/uL — ABNORMAL LOW (ref 145–400)
RBC: 4.1 10*6/uL (ref 3.70–5.45)
WBC: 71.5 10*3/uL (ref 3.9–10.3)

## 2011-10-23 LAB — COMPREHENSIVE METABOLIC PANEL
ALT: 22 U/L (ref 0–35)
CO2: 25 mEq/L (ref 19–32)
Calcium: 10.1 mg/dL (ref 8.4–10.5)
Chloride: 100 mEq/L (ref 96–112)
Creatinine, Ser: 1.05 mg/dL (ref 0.50–1.10)
Glucose, Bld: 79 mg/dL (ref 70–99)
Sodium: 134 mEq/L — ABNORMAL LOW (ref 135–145)
Total Protein: 5.8 g/dL — ABNORMAL LOW (ref 6.0–8.3)

## 2011-10-23 LAB — LACTATE DEHYDROGENASE: LDH: 175 U/L (ref 94–250)

## 2011-10-23 LAB — TECHNOLOGIST REVIEW

## 2011-10-23 NOTE — Progress Notes (Signed)
This office note has been dictated.  #[id number]

## 2011-10-23 NOTE — Telephone Encounter (Signed)
appts made and printed for 1/13 and 2/13     aom

## 2011-10-23 NOTE — Progress Notes (Signed)
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  Angelica Sullivan was accompanied by her husband, [name].  She was last seen by [place] on 09/15/2011.  At the present time, Angelica Sullivan seems to be doing well.  Her rhinitis seems to have improved dramatically.  The patient is without any complaints.  She no longer feels congested with nasal stuffiness.  She is using saline rinses twice a day after a course of antibiotics.  She has been without fever, chills, night sweats, or any signs of an active infection.  She denies any shortness of breath.  She does have bruising over her hands.  No abnormal bleeding.  PROBLEM LIST: 1. 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     bronchiolitis obliterans with organizing pneumonia.  There was     suggestion that the patient had an elevated white count extending     back even 2 or 3 years before that so the patient's diagnosis of     CLL may actually go back to 2007 or 2008.  She has not received any     treatments to date for her CLL. 2. Hypogammaglobulinemia. 3. Thrombocytopenia. 4. Chronic rhinitis. 5. Hypertension. 6. Dyslipidemia. 7. PPD positivity. 8. History of migraine headaches. 9. Osteoarthritis.  MEDICINES: 1. Lipitor. 2. Echinacea. 3. Fish oil, omega-3 fatty acids. 4. Flonase nasal spray. 5. Glucosamine chondroitin sulfate. 6. Claritin. 7. Toprol-XL. 8. Singulair. 9. Multivitamins. 10.Maxzide. 11.Ambien. 12.Saline nasal rinses.  The patient received Pneumovax in the spring of 2011.  She received a flu shot in September 2012.  PHYSICAL EXAM:  [name] looks well.  Weight is stable at 232 pounds 9.6 ounces, height 5 feet, body surface area 1.6 meters squared.  Blood pressure 129/64.  Other vital  signs are normal.  She is afebrile.  What is immediately noticeable is the absence of nasal congestion or hoarseness.  She has no scleral icterus.  Mouth and pharynx are benign. No obvious adenopathy in the neck although I did feel a flat lymph node in the right neck previously.  No axillary or inguinal adenopathy. Lungs:  Essentially clear to percussion and auscultation.  Cardiac: Regular rhythm without murmur, rub.  In the past I have heard a systolic ejection murmur.  Abdomen:  Benign with no organomegaly, masses palpable.  Extremities:  No peripheral edema.  She has purpura over the dorsum of her hands which she associates with trauma.  Neurologic:  Exam is grossly normal.  LABORATORY DATA:  Today, white count 71.5 with an ANC of 4.6.  The differential shows 6.4 neutrophils, 89.5% lymphocytes, hemoglobin 12.3, hematocrit 36.9 and platelets 89,000.  On 12/06, the white count was 66.0 and on 11/12 at the time of the patient's last visit, her white count was 55.8.  Chemistries today are pending.  Chemistries from 09/15/2011 were normal except for a glucose of 108 and a total protein that was low at 5.6 with an albumin of 4.3 and globulins of 1.3.  IMPRESSION AND PLAN:  Unfortunately the patient's rhinitis which in the past was given a diagnosis of allergic rhinitis, seems to be much improved with the current treatment program.  The patient remains with hypogammaglobulinemia and thrombocytopenia which seem to be stable.  Her white count has been increasing over  the past several months although back in October 2010, her white count was 85.6.  In looking over the patient's chart, I do not see where she has had CT scans of the abdomen and pelvis.  Her last chest x-ray was on 08/12/2011.  She did have a CT scan of the chest back on 08/01/2009.  If the patient's white count continues to rise, then we may want to consider CT scans of abdomen and pelvis.  For the moment she is asymptomatic.   We talked about possible interventions for her thrombocytopenia if it should worsen and be associated with more bruising and bleeding, although she is not having bleeding at the present time.  We will check CBC in approximately 1 month and plan to see Mrs. Sheeran again in 2 months at which time we will check CBC and chemistries.    ______________________________ Samul Dada, M.D. DSM/MEDQ  D:  10/23/2011  T:  10/23/2011  Job:  [id number]

## 2011-11-03 ENCOUNTER — Other Ambulatory Visit: Payer: Self-pay | Admitting: Internal Medicine

## 2011-11-03 DIAGNOSIS — Z1231 Encounter for screening mammogram for malignant neoplasm of breast: Secondary | ICD-10-CM

## 2011-11-18 ENCOUNTER — Ambulatory Visit
Admission: RE | Admit: 2011-11-18 | Discharge: 2011-11-18 | Disposition: A | Discharge status: Home / Self Care | Payer: Medicare Other | Source: Ambulatory Visit | Attending: Internal Medicine | Admitting: Internal Medicine

## 2011-11-18 DIAGNOSIS — Z1231 Encounter for screening mammogram for malignant neoplasm of breast: Secondary | ICD-10-CM

## 2011-11-20 ENCOUNTER — Other Ambulatory Visit (HOSPITAL_BASED_OUTPATIENT_CLINIC_OR_DEPARTMENT_OTHER): Payer: Medicare Other | Admitting: Lab

## 2011-11-20 DIAGNOSIS — C9110 Chronic lymphocytic leukemia of B-cell type not having achieved remission: Secondary | ICD-10-CM

## 2011-11-20 LAB — CBC WITH DIFFERENTIAL/PLATELET
BASO%: 0 % (ref 0.0–2.0)
HCT: 37.3 % (ref 34.8–46.6)
LYMPH%: 90 % — ABNORMAL HIGH (ref 14.0–49.7)
MCHC: 33.5 g/dL (ref 31.5–36.0)
MONO#: 0.3 10*3/uL (ref 0.1–0.9)
NEUT%: 9.1 % — ABNORMAL LOW (ref 38.4–76.8)
Platelets: 79 10*3/uL — ABNORMAL LOW (ref 145–400)
WBC: 62.4 10*3/uL (ref 3.9–10.3)

## 2011-12-24 ENCOUNTER — Other Ambulatory Visit: Payer: Medicare Other | Admitting: Lab

## 2011-12-24 ENCOUNTER — Ambulatory Visit: Payer: Medicare Other | Admitting: Oncology

## 2012-01-01 ENCOUNTER — Ambulatory Visit: Payer: Medicare Other | Admitting: Oncology

## 2012-01-01 ENCOUNTER — Other Ambulatory Visit: Payer: Medicare Other

## 2012-01-05 ENCOUNTER — Ambulatory Visit (HOSPITAL_BASED_OUTPATIENT_CLINIC_OR_DEPARTMENT_OTHER): Payer: Medicare Other | Admitting: Oncology

## 2012-01-05 ENCOUNTER — Encounter: Payer: Self-pay | Admitting: Oncology

## 2012-01-05 ENCOUNTER — Other Ambulatory Visit (HOSPITAL_BASED_OUTPATIENT_CLINIC_OR_DEPARTMENT_OTHER): Payer: Medicare Other

## 2012-01-05 VITALS — BP 145/59 | HR 66 | Temp 96.9°F | Ht 60.0 in | Wt 133.1 lb

## 2012-01-05 DIAGNOSIS — C9110 Chronic lymphocytic leukemia of B-cell type not having achieved remission: Secondary | ICD-10-CM

## 2012-01-05 LAB — CBC WITH DIFFERENTIAL/PLATELET
Basophils Absolute: 0.7 10*3/uL — ABNORMAL HIGH (ref 0.0–0.1)
HCT: 36.7 % (ref 34.8–46.6)
HGB: 11.8 g/dL (ref 11.6–15.9)
MONO#: 2.1 10*3/uL — ABNORMAL HIGH (ref 0.1–0.9)
NEUT#: 7 10*3/uL — ABNORMAL HIGH (ref 1.5–6.5)
NEUT%: 10.4 % — ABNORMAL LOW (ref 38.4–76.8)
WBC: 67.3 10*3/uL (ref 3.9–10.3)
lymph#: 57 10*3/uL — ABNORMAL HIGH (ref 0.9–3.3)

## 2012-01-05 LAB — COMPREHENSIVE METABOLIC PANEL
AST: 30 U/L (ref 0–37)
BUN: 17 mg/dL (ref 6–23)
CO2: 28 mEq/L (ref 19–32)
Calcium: 9.8 mg/dL (ref 8.4–10.5)
Chloride: 100 mEq/L (ref 96–112)
Creatinine, Ser: 1.18 mg/dL — ABNORMAL HIGH (ref 0.50–1.10)

## 2012-01-05 NOTE — Progress Notes (Signed)
This office note has been dictated.  #[id number]

## 2012-01-06 ENCOUNTER — Telehealth: Payer: Self-pay | Admitting: Oncology

## 2012-01-06 NOTE — Progress Notes (Signed)
CC:   Angelica Pounds, MD Angelica Milch, MD Angelica Contras, MD  PROBLEM LIST:  1. 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  bronchiolitis obliterans with organizing pneumonia. There was  suggestion that the patient had an elevated white count extending  back even 2 or 3 years before that so the patient's diagnosis of  CLL may actually go back to 2007 or 2008. She has not received any  treatments to date for her CLL.  2. Hypogammaglobulinemia.  3. Thrombocytopenia.  4. Chronic rhinitis.  5. Hypertension.  6. Dyslipidemia.  7. PPD positivity.  8. History of migraine headaches.  9. Osteoarthritis.   MEDICATIONS: 1. Lipitor 20 mg daily. 2. Echinacea 400 mg twice daily. 3. Fish oil omega-3 fatty acids 2 g daily. 4. Flonase nasal spray 2 sprays daily. 5. Glucosamine-chondroitin 500-400 one tablet daily. 6. Claritin 10 mg daily. 7. Toprol-XL 12.5 mg daily. 8. Singulair 10 mg daily. 9. Multivitamins 1 daily. 10.Maxzide 25 one daily. 11.Ambien 5 mg at bedtime.  HISTORY:  Angelica Sullivan, who recently turned 78 years old, is seen today for followup of her chronic lymphocytic leukemia in association with hypogammaglobulinemia and thrombocytopenia.  Angelica Sullivan was accompanied by her husband, Angelica Sullivan.  She was last seen by [place] on 10/23/2011.  Her condition has not changed, remains good.  She is using saline irrigations of her nose twice daily.  She does irrigate some thick material but continues to control her problem with these rinses.  She is really without any complaints today.  No fever, chills, night sweats,. She feels generally well.  PHYSICAL EXAMINATION:  She looks well, certainly younger than her stated age.  Weight is 133.1.  Weight is stable.  Height 5 feet even.  Body surface area 1.6 sq. m.  Blood pressure 145/59.  Other vital signs are normal.  No scleral icterus.  Mouth and pharynx are benign.  No  obvious adenopathy in the neck.  There may be a very subtle flat lymph node in the right mid neck.  No axillary or inguinal adenopathy.  Lungs:  Clear to percussion and auscultation.  Cardiac:  Regular rhythm with a possible soft systolic ejection murmur.  Breasts:  Not examined. Abdomen:  Notable for some resistance in the left upper quadrant raising the suggestion of splenomegaly.  No hepatomegaly, masses or tenderness. Extremities:  No peripheral edema, calf tenderness or clubbing.  The patient has a petechial-type rash over her anterior lower chest, upper abdomen.  Neurologic:  Grossly normal.  LABORATORY DATA:  Today, white count 67.3, ANC 7.0, hemoglobin 11.8, hematocrit 36.7, platelets 79,000.  Blood counts are stable.  Absolute lymphocyte count is 57.0.  Neutrophils at 10% lymphocytes 85%. Chemistries today were notable for a sodium of 134, BUN 17, creatinine 1.18.  Albumin was 3.9, total protein was 5.9.  Thus, globulins were 2.0.  LDH 221.  IMAGING STUDIES: 1. CT scan of the chest with IV contrast on 08/01/2009 showed a     calcified superior mesenteric mass closely related to the     esophagus, possibly related to calcified lymph nodes or a     submucosal esophageal mass.  There were patchy airspace filling     opacities.  Differential diagnosis includes a community-acquired     infectious process, atypical infectious process, bronchiolitis     obliterans with organizing pneumonia, pulmonary hemorrhage,     bronchoalveolar cell carcinoma.  CT scan of the  chest with IV     contrast on 09/19/2009 shows interval improvement in multifocal     airspace consolidation, most likely result of interval steroid     therapy.  There were borderline enlarged low internal jugular,     mediastinal, axillary and upper abdominal lymph nodes. 2. CT maxillofacial without contrast carried out on 06/06/2011 showed     the paranasal sinuses were clear with no radiographic evidence for      sinusitis.  There was stable mild generalized atrophy and white     matter disease. 3. Chest 2-view from 08/12/2011 showed no active cardiopulmonary     disease. 4. Screening bilateral mammogram from 11/18/2011 showed no     mammographic evidence for malignancy.  Next screening mammogram was     recommended to be in 1 year.  IMPRESSION/PLAN:  Angelica Sullivan seems to be doing well without any clinical or laboratory change from her last visit on 10/23/2011.  On today's exam I thought I could appreciate possible splenomegaly.  We will go ahead with an ultrasound of the abdomen in the next week or 2.  The patient has never had a CT scan of the abdomen, and we may want to consider this if her CLL seems to be progressing.  We will check a CBC in about 6 weeks and plan to see Angelica Sullivan again in approximately 3 months, at which time we will check CBC and chemistries.    ______________________________ Samul Dada, M.D. DSM/MEDQ  D:  01/05/2012  T:  01/06/2012  Job:  [id number]

## 2012-01-06 NOTE — Telephone Encounter (Signed)
l/m and mailed appts for 3/14,4/16 and6/4/13 aom

## 2012-01-15 ENCOUNTER — Other Ambulatory Visit: Payer: Self-pay | Admitting: Oncology

## 2012-01-15 ENCOUNTER — Ambulatory Visit (HOSPITAL_COMMUNITY)
Admission: RE | Admit: 2012-01-15 | Discharge: 2012-01-15 | Disposition: A | Discharge status: Home / Self Care | Payer: Medicare Other | Source: Ambulatory Visit | Attending: Oncology | Admitting: Oncology

## 2012-01-15 DIAGNOSIS — R161 Splenomegaly, not elsewhere classified: Secondary | ICD-10-CM | POA: Insufficient documentation

## 2012-01-15 DIAGNOSIS — C9110 Chronic lymphocytic leukemia of B-cell type not having achieved remission: Secondary | ICD-10-CM

## 2012-02-06 ENCOUNTER — Telehealth: Payer: Self-pay | Admitting: Oncology

## 2012-02-06 NOTE — Telephone Encounter (Signed)
l/m with appt change from 6/4 to 6/10   aom

## 2012-02-17 ENCOUNTER — Other Ambulatory Visit (HOSPITAL_BASED_OUTPATIENT_CLINIC_OR_DEPARTMENT_OTHER): Payer: Medicare Other | Admitting: Lab

## 2012-02-17 DIAGNOSIS — C9110 Chronic lymphocytic leukemia of B-cell type not having achieved remission: Secondary | ICD-10-CM

## 2012-02-17 LAB — MANUAL DIFFERENTIAL
ANC (CHCC manual diff): 2.5 10*3/uL (ref 1.5–6.5)
Band Neutrophils: 0 % (ref 0–10)
Basophil: 0 % (ref 0–2)
Blasts: 0 % (ref 0–0)
EOS: 2 % (ref 0–7)
LYMPH: 94 % — ABNORMAL HIGH (ref 14–49)
Other Cell: 0 % (ref 0–0)
PLT EST: DECREASED
SEG: 4 % — ABNORMAL LOW (ref 38–77)
nRBC: 0 % (ref 0–0)

## 2012-02-17 LAB — CBC WITH DIFFERENTIAL/PLATELET
HCT: 36.6 % (ref 34.8–46.6)
HGB: 11.9 g/dL (ref 11.6–15.9)
MCH: 29.7 pg (ref 25.1–34.0)
MCV: 91.3 fL (ref 79.5–101.0)

## 2012-04-06 ENCOUNTER — Ambulatory Visit: Payer: Medicare Other | Admitting: Oncology

## 2012-04-06 ENCOUNTER — Other Ambulatory Visit: Payer: Medicare Other

## 2012-04-12 ENCOUNTER — Other Ambulatory Visit (HOSPITAL_BASED_OUTPATIENT_CLINIC_OR_DEPARTMENT_OTHER): Payer: Medicare Other | Admitting: Lab

## 2012-04-12 ENCOUNTER — Telehealth: Payer: Self-pay | Admitting: Oncology

## 2012-04-12 ENCOUNTER — Encounter: Payer: Self-pay | Admitting: Oncology

## 2012-04-12 ENCOUNTER — Ambulatory Visit (HOSPITAL_BASED_OUTPATIENT_CLINIC_OR_DEPARTMENT_OTHER): Payer: Medicare Other | Admitting: Oncology

## 2012-04-12 VITALS — BP 122/59 | HR 66 | Temp 97.0°F | Ht 60.0 in | Wt 126.0 lb

## 2012-04-12 DIAGNOSIS — R110 Nausea: Secondary | ICD-10-CM

## 2012-04-12 DIAGNOSIS — C9110 Chronic lymphocytic leukemia of B-cell type not having achieved remission: Secondary | ICD-10-CM

## 2012-04-12 DIAGNOSIS — R634 Abnormal weight loss: Secondary | ICD-10-CM

## 2012-04-12 DIAGNOSIS — G4700 Insomnia, unspecified: Secondary | ICD-10-CM

## 2012-04-12 LAB — COMPREHENSIVE METABOLIC PANEL
ALT: 22 U/L (ref 0–35)
AST: 27 U/L (ref 0–37)
Alkaline Phosphatase: 43 U/L (ref 39–117)
BUN: 25 mg/dL — ABNORMAL HIGH (ref 6–23)
Chloride: 97 mEq/L (ref 96–112)
Creatinine, Ser: 1.08 mg/dL (ref 0.50–1.10)
Total Bilirubin: 0.5 mg/dL (ref 0.3–1.2)

## 2012-04-12 LAB — CBC WITH DIFFERENTIAL/PLATELET
BASO%: 0.1 % (ref 0.0–2.0)
Basophils Absolute: 0.1 10*3/uL (ref 0.0–0.1)
EOS%: 0.4 % (ref 0.0–7.0)
HCT: 34.9 % (ref 34.8–46.6)
LYMPH%: 89.9 % — ABNORMAL HIGH (ref 14.0–49.7)
MCH: 28.8 pg (ref 25.1–34.0)
MCHC: 31.6 g/dL (ref 31.5–36.0)
MCV: 91.2 fL (ref 79.5–101.0)
MONO%: 2 % (ref 0.0–14.0)
NEUT%: 7.6 % — ABNORMAL LOW (ref 38.4–76.8)
lymph#: 65 10*3/uL — ABNORMAL HIGH (ref 0.9–3.3)

## 2012-04-12 NOTE — Telephone Encounter (Signed)
Gave pt appt calendar for August 2013 lab and MD 

## 2012-04-12 NOTE — Progress Notes (Signed)
This office note has been dictated.  #[id number]

## 2012-04-12 NOTE — Progress Notes (Signed)
CC:   Angelica Pounds, MD Angelica Milch, MD Angelica Contras, MD  PROBLEM LIST:  1. 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  bronchiolitis obliterans with organizing pneumonia. There was  suggestion that the patient had an elevated white count extending  back even 2 or 3 years before that so the patient's diagnosis of  CLL may actually go back to 2007 or 2008. She has not received any  treatments to date for her CLL. Splenomegaly noted on ultrasound of  the abdomen on 01/15/2012. Spleen measured 664 mL. 2. Hypogammaglobulinemia.  3. Thrombocytopenia.  4. Chronic rhinitis.  5. Hypertension.  6. Dyslipidemia.  7. PPD positivity.  8. History of migraine headaches.  9. Osteoarthritis. 10. Soft systolic ejection murmur. 11. Weight loss. 12. Fibromyalgia. 13. Irritable bowel syndrome.  MEDICATIONS:  1. Lipitor 20 mg daily.  2. Echinacea 400 mg twice daily.  3. Fish oil omega-3 fatty acids 2 g daily.  4. Flonase nasal spray 4 times weekly.  5. Glucosamine-chondroitin 500-400 one tablet daily.  6. Claritin 10 mg daily.  7. Toprol-XL 12.5 mg daily.  8. Singulair 10 mg daily.  9. Multivitamins 1 daily.  10.Maxzide 25 one daily.  11.Ambien 5 mg at bedtime. 12.Saline nasal irrigations twice daily.  Pneumovax given in 2004 and again in spring of 2011. Influenza vaccine given every fall.  HISTORY:  I saw Angelica Sullivan today for followup of her chronic lymphocytic leukemia in association with hypogammaglobulinemia, thrombocytopenia and splenomegaly.  Angelica Sullivan was accompanied by her husband, Angelica Sullivan.  She was last seen by [place] on 01/05/2012.  Approximately a month ago she and her husband had what sounds like a viral gastroenteritis.  The patient has lost some weight, which she attributes to that illness.  She does not have much of an appetite.  She occasionally has some nausea which has been ongoing for some  time.  She denies any vomiting, abdominal pain, change of bowel habit, which is intermittent diarrhea.  She denies any obvious blood in her stools.  She denies any fever, chills, night sweats.  Energy is fairly good and she is active.  She does have some difficulty with sleeping and we talked about various sleeping remedies.  The patient does have Ambien which she uses sparingly.  The patient has not had any recurrence of the rhinitis and sinusitis that was bothering her several months ago.  PHYSICAL EXAMINATION:  The patient looks fairly well.  Weight today is 126 Sullivan as compared with 133.1 Sullivan on 01/05/2012.  Height 5 feet even, body surface area 1.56 meters squared.  Blood pressure 122/59. Other vital signs are normal.  She is afebrile.  There is no scleral icterus.  Mouth and pharynx are benign.  No peripheral adenopathy palpable.  Lungs are clear to percussion and auscultation.  Cardiac: Soft systolic ejection murmur was heard today.  Breast exam was not carried out.  Abdomen is notable for spleen edge which can be felt with inspiration.  No hepatomegaly, abdominal masses or tenderness.  Abdomen is generally soft.  No axillary or inguinal adenopathy.  Extremities: No peripheral edema, clubbing.  Neurologic exam was grossly normal.  I did not take note of any rashes today.   LABORATORY DATA:  Today, white count 72.4, ANC 5.5, hemoglobin 11.0, hematocrit 34.9, platelets 80,000.  Absolute lymphocyte count was 65.0. Neutrophils were 7.6%, lymphocytes 90%.  Variant lymphs and smudge cells were seen.  Chemistries  today are pending.  Chemistries from 01/05/2012 notable for sodium of 134, BUN 17, creatinine 1.18.  IMAGING STUDIES:  1. CT scan of the chest with IV contrast on 08/01/2009 showed a  calcified superior mesenteric mass closely related to the  esophagus, possibly related to calcified lymph nodes or a  submucosal esophageal mass. There were patchy airspace filling    opacities. Differential diagnosis includes a community-acquired  infectious process, atypical infectious process, bronchiolitis  obliterans with organizing pneumonia, pulmonary hemorrhage,  bronchoalveolar cell carcinoma. CT scan of the chest with IV  contrast on 09/19/2009 shows interval improvement in multifocal  airspace consolidation, most likely result of interval steroid  therapy. There were borderline enlarged low internal jugular,  mediastinal, axillary and upper abdominal lymph nodes.  2. CT maxillofacial without contrast carried out on 06/06/2011 showed  the paranasal sinuses were clear with no radiographic evidence for  sinusitis. There was stable mild generalized atrophy and white  matter disease.  3. Chest 2-view from 08/12/2011 showed no active cardiopulmonary  disease.  4. Screening bilateral mammogram from 11/18/2011 showed no  mammographic evidence for malignancy. Next screening mammogram was  recommended to be in 1 year. 5. Limited abdominal ultrasound on 01/15/2012 showed mild splenomegaly.  The spleen measured 14.6 x 15.3 x 5.6 cm.  Splenic volume was calculated to be 664 mL.  There were no focal splenic abnormalities.  There was no hydronephrosis.   IMPRESSION AND PLAN:  Aside from some weight loss of approximately 7 Sullivan Angelica Sullivan seems to be doing fairly well.  I do not detect any major changes in her blood counts or physical exam.  We did talk today about nutritional supplements.  I encouraged Angelica Sullivan to eat frequent small meals which may help with her nausea.  I also encouraged her not to lose any more weight.  We talked about nutritional supplements.  We talked about management of her insomnia.  I suggested if she continues to have nausea or weight loss she may want to see Dr. Yancey Flemings whom she has seen in the past.  The patient stated that she does have irritable bowel syndrome and has had this for some time.  We talked about her CLL.  I do not see  any indications for treatment at this time, but I do feel that we need to monitor Angelica Sullivan fairly closely.  I would like to see her again in 2 months at which time we will check CBC, chemistries and an LDH.    ______________________________ Samul Dada, M.D. DSM/MEDQ  D:  04/12/2012  T:  04/12/2012  Job:  [id number]

## 2012-05-04 ENCOUNTER — Telehealth: Payer: Self-pay | Admitting: Nutrition

## 2012-05-04 NOTE — Telephone Encounter (Signed)
Patient was identified on the nutrition risk screen secondary to 7 # wt loss and poor oral intake with poor appetite.  Patient has just recovered from a GI illness and reports she is eating a bit better.  She has not regained any weight.  I offered nutrition support/education if needed.  Patient agrees to contact me with questions or concerns.

## 2012-06-08 ENCOUNTER — Telehealth: Payer: Self-pay | Admitting: Oncology

## 2012-06-08 NOTE — Telephone Encounter (Signed)
aware of 8/8 appt being moved to 8/15

## 2012-06-10 ENCOUNTER — Other Ambulatory Visit: Payer: Medicare Other | Admitting: Lab

## 2012-06-10 ENCOUNTER — Ambulatory Visit: Payer: Medicare Other | Admitting: Family

## 2012-06-10 ENCOUNTER — Ambulatory Visit: Payer: Medicare Other | Admitting: Oncology

## 2012-06-14 ENCOUNTER — Ambulatory Visit (HOSPITAL_BASED_OUTPATIENT_CLINIC_OR_DEPARTMENT_OTHER): Payer: Medicare Other | Admitting: Family

## 2012-06-14 ENCOUNTER — Telehealth: Payer: Self-pay | Admitting: Oncology

## 2012-06-14 ENCOUNTER — Encounter: Payer: Self-pay | Admitting: Family

## 2012-06-14 ENCOUNTER — Other Ambulatory Visit (HOSPITAL_BASED_OUTPATIENT_CLINIC_OR_DEPARTMENT_OTHER): Payer: Medicare Other | Admitting: Lab

## 2012-06-14 VITALS — BP 137/60 | HR 69 | Temp 97.0°F | Resp 20 | Ht 60.0 in | Wt 125.1 lb

## 2012-06-14 DIAGNOSIS — D696 Thrombocytopenia, unspecified: Secondary | ICD-10-CM

## 2012-06-14 DIAGNOSIS — C9110 Chronic lymphocytic leukemia of B-cell type not having achieved remission: Secondary | ICD-10-CM

## 2012-06-14 DIAGNOSIS — R161 Splenomegaly, not elsewhere classified: Secondary | ICD-10-CM

## 2012-06-14 DIAGNOSIS — D801 Nonfamilial hypogammaglobulinemia: Secondary | ICD-10-CM

## 2012-06-14 LAB — TECHNOLOGIST REVIEW

## 2012-06-14 LAB — CBC WITH DIFFERENTIAL/PLATELET
Basophils Absolute: 0.3 10*3/uL — ABNORMAL HIGH (ref 0.0–0.1)
Eosinophils Absolute: 0.5 10*3/uL (ref 0.0–0.5)
HCT: 37.4 % (ref 34.8–46.6)
HGB: 11.7 g/dL (ref 11.6–15.9)
LYMPH%: 93.6 % — ABNORMAL HIGH (ref 14.0–49.7)
MCV: 92.5 fL (ref 79.5–101.0)
MONO#: 0.8 10*3/uL (ref 0.1–0.9)
MONO%: 0.8 % (ref 0.0–14.0)
NEUT#: 4.6 10*3/uL (ref 1.5–6.5)
Platelets: 76 10*3/uL — ABNORMAL LOW (ref 145–400)
WBC: 95.3 10*3/uL (ref 3.9–10.3)

## 2012-06-14 LAB — COMPREHENSIVE METABOLIC PANEL
Albumin: 4.5 g/dL (ref 3.5–5.2)
Alkaline Phosphatase: 46 U/L (ref 39–117)
BUN: 20 mg/dL (ref 6–23)
CO2: 27 mEq/L (ref 19–32)
Glucose, Bld: 94 mg/dL (ref 70–99)
Potassium: 4.5 mEq/L (ref 3.5–5.3)
Sodium: 135 mEq/L (ref 135–145)
Total Bilirubin: 0.6 mg/dL (ref 0.3–1.2)
Total Protein: 5.8 g/dL — ABNORMAL LOW (ref 6.0–8.3)

## 2012-06-14 LAB — LACTATE DEHYDROGENASE: LDH: 152 U/L (ref 94–250)

## 2012-06-14 NOTE — Patient Instructions (Addendum)
Patient ID: Angelica Sullivan,   DOB: May 07, 1933,  MRN: [id number]   [facility] Discharge Instructions  RECOMMENDATIONS MAD BY THE CONSULTANT AND ANY TEST RESULT(S) WILL BE FORWARDED TO YOU REFERRING DOCTOR   EXAM FINDINGS BY NURSE PRACTITIONER TODAY TO REPORT TO THE CLINIC OR PRIMARY PROVIDER: N/A   Your Current Medications Are: Current Outpatient Prescriptions  Medication Sig Dispense Refill  . atorvastatin (LIPITOR) 20 MG tablet Take 20 mg by mouth daily.        . calcium citrate-vitamin D (CITRACAL+D) 315-200 MG-UNIT per tablet Take 1 tablet by mouth 2 (two) times daily.      . Echinacea 400 MG CAPS Take 1 capsule by mouth 2 (two) times daily.        . fish oil-omega-3 fatty acids 1000 MG capsule Take 2 g by mouth daily.       . fluticasone (FLONASE) 50 MCG/ACT nasal spray Place 2 sprays into the nose daily. prn      . glucosamine-chondroitin 500-400 MG tablet Take 1 tablet by mouth daily.        Marland Kitchen loratadine (CLARITIN) 10 MG tablet Take 10 mg by mouth daily.        . metoprolol succinate (TOPROL-XL) 25 MG 24 hr tablet Take 25 mg by mouth daily. Take 1/2 tab po daily       . montelukast (SINGULAIR) 10 MG tablet Take 10 mg by mouth daily.       . Multiple Vitamin (MULTIVITAMIN) tablet Take 1 tablet by mouth daily.        Marland Kitchen tiZANidine (ZANAFLEX) 4 MG tablet Take 4 mg by mouth 3 (three) times a week.      . triamterene-hydrochlorothiazide (MAXZIDE-25) 37.5-25 MG per tablet Take 1 tablet by mouth daily.        Marland Kitchen zolpidem (AMBIEN) 5 MG tablet Take 5 mg by mouth at bedtime as needed.           INSTRUCTIONS GIVEN, DISCUSSED AND FOLLOW-UP: Laboratories in 1 month Laboratories and follow up visit in 2 months  I acknowledge that I have been informed and understand all the instructions given to me and have received a copy.  I do not have any further questions at this time, but I understand that I may call the [facility] at [phone] during business hours  should I have any further questions or need assistance in obtaining follow-up care.   06/14/2012, 10:05 AM

## 2012-06-14 NOTE — Progress Notes (Signed)
Patient ID: Angelica Sullivan, female   DOB: 1933/04/27, 78 y.o.   MRN: [id number] CSN: [id number]  CC: [name], MD  Oretha Milch, MD  Antony Contras, MD  Problem List: Angelica Sullivan is a 78 y.o. Caucasian female with a problem list consisting of:  1. 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  bronchiolitis obliterans with organizing pneumonia. There was suggestion that the patient had an elevated white count extending  back even 2 or 3 years before that so the patient's diagnosis of CLL may actually go back to 2007 or 2008. She has not received any  treatments to date for her CLL. Splenomegaly noted on ultrasound of the abdomen on 01/15/2012. Spleen measured 664 mL.  2. Hypogammaglobulinemia.  3. Thrombocytopenia.  4. Chronic rhinitis.  5. Hypertension.  6. Dyslipidemia.  7. PPD positivity.  8. History of migraine headaches.  9. Osteoarthritis.  10. Soft systolic ejection murmur.  11. Weight loss.  12. Fibromyalgia.  13. Irritable bowel syndrome.  Mrs. Angelica Sullivan was seen today for follow up of her chronic lymphocytic leukemia in association with hypogammaglobulinemia,  thrombocytopenia and splenomegaly. Angelica Sullivan was accompanied by her husband, Angelica Sullivan. She was last seen by [place] on 04/12/2012. The patient has lost some weight since her last visit, but it was only about 1 lb. The patient states that she is eating a healthy diet with supplemental nutritional drinks and occasional milkshakes.  The patient also consumes a protein shake weekly.  She is still stating that she does not have much of an appetite.  She denies any obvious blood in her stools or any unusual bleeding.  The patient has large hematomas on her bilateral UEs which she attributes to bumping her arms while cleaning out the garage recently and taking Aleve.  The patient was instructed by Dr. Arline Asp to take Tylenol instead of Aleve.  The patient  denies any fever, chills, night sweats. Her energy is fairly good and she is active.  The patient recently had conjunctivitis and has taken a 5-day course of Cipro opthalmic solution.  The conjunctivitis has resolved.  The patient is without complaint today and denies any other symptomatology.  Past Medical History: Past Medical History  Diagnosis Date  . Leukemia 08/06/09    CLL  . Hypertension   . Hyperlipidemia   . Irritable bowel   . Osteoporosis   . GERD (gastroesophageal reflux disease)   . 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 Rhinitis, chronic 09/15/2011  . Thrombocytopenia 09/15/2011    Surgical History: History reviewed. No pertinent past surgical history.  Current Medications: Current Outpatient Prescriptions  Medication Sig Dispense Refill  . atorvastatin (LIPITOR) 20 MG tablet Take 20 mg by mouth daily.        . calcium citrate-vitamin D (CITRACAL+D) 315-200 MG-UNIT per tablet Take 1 tablet by mouth 2 (two) times daily.      . Echinacea 400 MG CAPS Take 1 capsule by mouth 2 (two) times daily.        . fish oil-omega-3 fatty acids 1000 MG capsule Take 2 g by mouth daily.       . fluticasone (FLONASE) 50 MCG/ACT nasal spray Place 2 sprays into the nose daily. prn      . glucosamine-chondroitin 500-400 MG tablet Take 1 tablet by mouth daily.        Marland Kitchen loratadine (CLARITIN) 10 MG tablet Take 10 mg by mouth daily.        Marland Kitchen  metoprolol succinate (TOPROL-XL) 25 MG 24 hr tablet Take 25 mg by mouth daily. Take 1/2 tab po daily       . montelukast (SINGULAIR) 10 MG tablet Take 10 mg by mouth daily.       . Multiple Vitamin (MULTIVITAMIN) tablet Take 1 tablet by mouth daily.        Marland Kitchen tiZANidine (ZANAFLEX) 4 MG tablet Take 4 mg by mouth 3 (three) times a week.      . triamterene-hydrochlorothiazide (MAXZIDE-25) 37.5-25 MG per tablet Take 1 tablet by mouth daily.        Marland Kitchen zolpidem (AMBIEN) 5 MG tablet Take 5 mg by mouth at bedtime as needed.          Allergies: Allergies    Allergen Reactions  . Bactrim (Sulfamethoxazole W/Trimethoprim (Co-Trimoxazole)) Diarrhea and Other (See Comments)    Abdominal cramping     Family History: Family History  Problem Relation Age of Onset  . Pneumonia Father     Social History: History  Substance Use Topics  . Smoking status: Never Smoker   . Smokeless tobacco: Not on file  . Alcohol Use: No    Review of Systems: 10 Point review of systems was completed and is negative except as noted above.   Physical Exam:   Blood pressure 137/60, pulse 69, temperature 97 F (36.1 C), temperature source Oral, resp. rate 20, height 5' (1.524 m), weight 125 lb 1.6 oz (56.745 kg).  General appearance: Alert, cooperative, well developed, no distress Head: Normocephalic, without obvious abnormality, atraumatic Eyes: Conjunctivae/corneas clear, PERRLA, EOMI Nose: Nares, septum and mucosa are normal, no drainage or sinus tenderness Neck: No adenopathy, supple, symmetrical, trachea midline and thyroid not enlarged, no tenderness Back: Symmetric,  ROM normal, no CVA tenderness Resp: Clear to auscultation bilaterally Cardio: Regular rate and rhythm, S1, S2 normal, 1/6 murmur,  no click/rub/gallop GI: Soft, non-tender, hypoactive bowel sounds, no masses, slight splenomegaly Extremities: Extremities normal, atraumatic, no cyanosis or edema Lymph nodes: Cervical, supraclavicular, and axillary nodes normal Neurologic: Grossly normal   Laboratory Data: Results for orders placed in visit on 06/14/12 (from the past 48 hour(s))  CBC WITH DIFFERENTIAL     Status: Abnormal   Collection Time   06/14/12  8:51 AM      Component Value Range Comment   WBC 95.3 (*) 3.9 - 10.3 10e3/uL    NEUT# 4.6  1.5 - 6.5 10e3/uL    HGB 11.7  11.6 - 15.9 g/dL    HCT 30.8  65.7 - 84.6 %    Platelets 76 (*) 145 - 400 10e3/uL    MCV 92.5  79.5 - 101.0 fL    MCH 29.0  25.1 - 34.0 pg    MCHC 31.3 (*) 31.5 - 36.0 g/dL    RBC 9.62  9.52 - 8.41 10e6/uL     RDW 14.6 (*) 11.2 - 14.5 %    lymph# 89.2 (*) 0.9 - 3.3 10e3/uL    MONO# 0.8  0.1 - 0.9 10e3/uL    Eosinophils Absolute 0.5  0.0 - 0.5 10e3/uL    Basophils Absolute 0.3 (*) 0.0 - 0.1 10e3/uL    NEUT% 4.8 (*) 38.4 - 76.8 %    LYMPH% 93.6 (*) 14.0 - 49.7 %    MONO% 0.8  0.0 - 14.0 %    EOS% 0.5  0.0 - 7.0 %    BASO% 0.3  0.0 - 2.0 %   TECHNOLOGIST REVIEW     Status: Normal  Collection Time   06/14/12  8:51 AM      Component Value Range Comment   Technologist Review Variant lymphs present, moderate smudge cells        Imaging Studies: 1. CT scan of the chest with IV contrast on 08/01/2009 showed a calcified superior mesenteric mass closely related to the esophagus, possibly related to calcified lymph nodes or a submucosal esophageal mass. There were patchy airspace filling opacities. Differential diagnosis includes a community-acquired infectious process, atypical infectious process, bronchiolitis obliterans with organizing pneumonia, pulmonary hemorrhage, bronchoalveolar cell carcinoma. CT scan of the chest with IV contrast on 09/19/2009 shows interval improvement in multifocal airspace consolidation, most likely result of interval steroid therapy. There were borderline enlarged low internal jugular, mediastinal, axillary and upper abdominal lymph nodes.  2. CT maxillofacial without contrast carried out on 06/06/2011 showed the paranasal sinuses were clear with no radiographic evidence for  sinusitis. There was stable mild generalized atrophy and white matter disease.  3. Chest 2-view from 08/12/2011 showed no active cardiopulmonary disease.  4. Screening bilateral mammogram from 11/18/2011 showed no mammographic evidence for malignancy. Next screening mammogram was recommended to be in 1 year.  5. Limited abdominal ultrasound on 01/15/2012 showed mild splenomegaly. The spleen measured 14.6 x 15.3 x 5.6 cm. Splenic volume was calculated to be 664 mL. There were no focal splenic abnormalities.  There was no hydronephrosis.  Impression/Plan: Mrs. Sullivan seems to be doing fairly well. WBC's are elevated and platelets have decreased, but nothing focal on exam.  Angelica Sullivan was encouraged her not to lose any more weight.  Dr. Arline Asp discussed with her about the CLL and he does not see any  indication for treatment at this time.   Angelica Sullivan will be monitored fairly closely.   The patient will return in 1 month for a CBC and then follow up with an office visit with Dr. Arline Asp in 2 months' time.  In 2 months we will check CBC, chemistries, LDH and her annual CXR.  The patient and her husband agreed to contact us sooner if she has any problems or concerns.   Zuleima Haser  NP-C 06/14/2012, 11:51 AM

## 2012-06-14 NOTE — Telephone Encounter (Signed)
appts made and printed for pt aom °

## 2012-07-12 ENCOUNTER — Other Ambulatory Visit (HOSPITAL_BASED_OUTPATIENT_CLINIC_OR_DEPARTMENT_OTHER): Payer: Medicare Other

## 2012-07-12 DIAGNOSIS — C9110 Chronic lymphocytic leukemia of B-cell type not having achieved remission: Secondary | ICD-10-CM

## 2012-07-12 LAB — CBC WITH DIFFERENTIAL/PLATELET
BASO%: 0.2 % (ref 0.0–2.0)
EOS%: 0.6 % (ref 0.0–7.0)
HCT: 35.7 % (ref 34.8–46.6)
MCH: 29 pg (ref 25.1–34.0)
MCHC: 32.1 g/dL (ref 31.5–36.0)
MONO#: 2.3 10*3/uL — ABNORMAL HIGH (ref 0.1–0.9)
RDW: 14.4 % (ref 11.2–14.5)
WBC: 90.5 10*3/uL (ref 3.9–10.3)
lymph#: 82.1 10*3/uL — ABNORMAL HIGH (ref 0.9–3.3)

## 2012-07-12 LAB — TECHNOLOGIST REVIEW

## 2012-08-16 ENCOUNTER — Other Ambulatory Visit (HOSPITAL_BASED_OUTPATIENT_CLINIC_OR_DEPARTMENT_OTHER): Payer: Medicare Other | Admitting: Lab

## 2012-08-16 ENCOUNTER — Ambulatory Visit (HOSPITAL_BASED_OUTPATIENT_CLINIC_OR_DEPARTMENT_OTHER): Payer: Medicare Other | Admitting: Oncology

## 2012-08-16 ENCOUNTER — Encounter: Payer: Self-pay | Admitting: Oncology

## 2012-08-16 ENCOUNTER — Ambulatory Visit (HOSPITAL_COMMUNITY)
Admission: RE | Admit: 2012-08-16 | Discharge: 2012-08-16 | Disposition: A | Discharge status: Home / Self Care | Payer: Medicare Other | Source: Ambulatory Visit | Attending: Family | Admitting: Family

## 2012-08-16 ENCOUNTER — Telehealth: Payer: Self-pay | Admitting: Oncology

## 2012-08-16 ENCOUNTER — Telehealth: Payer: Self-pay | Admitting: *Deleted

## 2012-08-16 VITALS — BP 138/58 | HR 64 | Temp 97.5°F | Resp 18 | Ht 60.0 in | Wt 123.5 lb

## 2012-08-16 DIAGNOSIS — D696 Thrombocytopenia, unspecified: Secondary | ICD-10-CM

## 2012-08-16 DIAGNOSIS — C9110 Chronic lymphocytic leukemia of B-cell type not having achieved remission: Secondary | ICD-10-CM

## 2012-08-16 DIAGNOSIS — D801 Nonfamilial hypogammaglobulinemia: Secondary | ICD-10-CM

## 2012-08-16 LAB — CBC WITH DIFFERENTIAL/PLATELET
Basophils Absolute: 0.3 10*3/uL — ABNORMAL HIGH (ref 0.0–0.1)
Eosinophils Absolute: 0.7 10*3/uL — ABNORMAL HIGH (ref 0.0–0.5)
HGB: 12.1 g/dL (ref 11.6–15.9)
MONO#: 2.8 10*3/uL — ABNORMAL HIGH (ref 0.1–0.9)
MONO%: 2.6 % (ref 0.0–14.0)
NEUT#: 4.6 10*3/uL (ref 1.5–6.5)
RBC: 4.16 10*6/uL (ref 3.70–5.45)
RDW: 15.1 % — ABNORMAL HIGH (ref 11.2–14.5)
WBC: 108.7 10*3/uL (ref 3.9–10.3)
lymph#: 100.3 10*3/uL — ABNORMAL HIGH (ref 0.9–3.3)

## 2012-08-16 LAB — COMPREHENSIVE METABOLIC PANEL (CC13)
Albumin: 4.2 g/dL (ref 3.5–5.0)
Alkaline Phosphatase: 56 U/L (ref 40–150)
CO2: 24 mEq/L (ref 22–29)
Calcium: 10.3 mg/dL (ref 8.4–10.4)
Chloride: 103 mEq/L (ref 98–107)
Glucose: 71 mg/dl (ref 70–99)
Potassium: 4.4 mEq/L (ref 3.5–5.1)
Sodium: 136 mEq/L (ref 136–145)
Total Protein: 6 g/dL — ABNORMAL LOW (ref 6.4–8.3)

## 2012-08-16 LAB — TECHNOLOGIST REVIEW

## 2012-08-16 NOTE — Telephone Encounter (Signed)
THIS REPORT WAS CALLED AND FAXED. GIVEN TO DR.MURINSON'S NP JACKIE HUNTER.

## 2012-08-16 NOTE — Telephone Encounter (Signed)
gv pt appt for 12/17.

## 2012-08-16 NOTE — Progress Notes (Signed)
This office note has been dictated.  #[id number]

## 2012-08-16 NOTE — Patient Instructions (Signed)
I will review chest x-ray with a radiologist.  We will let you know whether you need to have a chest CT scan.

## 2012-08-17 ENCOUNTER — Other Ambulatory Visit: Payer: Self-pay | Admitting: Oncology

## 2012-08-17 DIAGNOSIS — R9389 Abnormal findings on diagnostic imaging of other specified body structures: Secondary | ICD-10-CM

## 2012-08-17 NOTE — Progress Notes (Signed)
CC:   [name], MD Oretha Milch, MD Antony Contras, MD  PROBLEM LIST:  1. 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  bronchiolitis obliterans with organizing pneumonia. There was  suggestion that the patient had an elevated white count extending  back even 2 or 3 years before that so the patient's diagnosis of  CLL may actually go back to 2007 or 2008. She has not received any  treatments to date for her CLL. Splenomegaly noted on ultrasound of  the abdomen on 01/15/2012. Spleen measured 664 mL.  2. Hypogammaglobulinemia.  3. Thrombocytopenia.  4. Chronic rhinitis.  5. Hypertension.  6. Dyslipidemia.  7. PPD positivity.  8. History of migraine headaches.  9. Osteoarthritis.  10. Soft systolic ejection murmur.  11. Weight loss.  12. Fibromyalgia.  13. Irritable bowel syndrome.    MEDICATIONS:  1. Lipitor 20 mg daily.  2. Echinacea 400 mg twice daily.  3. Fish oil omega-3 fatty acids 2 g daily.  4. Saline nasal irrigations twice daily.   5. Glucosamine-chondroitin 500-400 one tablet daily.  6. Claritin 10 mg daily.  7. Toprol-XL 12.5 mg daily.  8. Singulair 10 mg daily.  9. Multivitamins 1 daily.  10.Maxzide 25 one daily.  11.Ambien 5 mg at bedtime.    Pneumovax given in 2004 and again in spring of 2011.  Influenza vaccine was given most recently in September 2013.  The patient receives this every fall.  SMOKING HISTORY:  The patient has never smoked cigarettes.    HISTORY:  Angelica Sullivan was seen today for follow-up of her chronic lymphocytic leukemia in association with hypogammaglobulinemia, thrombocytopenia, and splenomegaly.  Angelica Sullivan was accompanied by her husband [name].  She was last seen by [place] on 06/14/2012 and prior to that on 04/12/2012.  She tells me that in late September she once again had nasal congestion and saw Dr. Jenne Pane.  She was treated with nasal irrigation and  symptoms have improved.  She denies any night sweats.  Energy is okay.  Her appetite remains fair to poor and, unfortunately, she continues to lose weight.  I do not think she is taking any nutritional supplements.  She denies any other major symptoms, seems to be getting along fairly well without any changes over the past couple of months.  PHYSICAL EXAMINATION:  General:  She looks well.  Weight is 123.5 pounds as compared with 125.1 pounds on 06/14/2012 and 126 pounds on 04/12/2012.  Height 5 feet even, body surface area 1.54 m sq.  Vital Signs:  Blood pressure 138/58.  Other vital signs are normal.  HEENT: There is no scleral icterus.  Mouth and pharynx are benign.  No peripheral adenopathy palpable.  Lungs:  Clear to percussion and auscultation.  Cardiac:  Very soft systolic ejection murmur.  Breasts: Not examined.  Abdomen:  Notable for spleen tip which descends with inspiration.  I do not think the spleen has gotten any larger.  No hepatomegaly, abdominal masses, ascites, or tenderness.  Abdomen is generally soft and doughy.  Lymph Nodes:  No axillary or inguinal adenopathy.  Extremities:  No peripheral edema or clubbing.  No petechiae or purpura.  Neurologic:  Normal.  LABORATORY DATA:  Today white count 108.7 compared with 90.5 on 07/12/2012 and 95.3 on 06/14/2012.  Absolute lymphocyte count was 100.3 as compared with 89.2 on 06/14/2012.  The patient has atypical lymphs and smudge cells on peripheral smear.  Hemoglobin  12.1, hematocrit 38.2, and platelets 79,000.  Platelet count has been generally stable.  There are 4% neutrophils, 92% lymphs.  Chemistries at today are pending. Chemistries from 06/14/2012 notable for a total protein of 5.8 and albumin of 4.5, the globulins were 1.3.  The patient does have hypogammaglobulinemia.  On 08/12/2011 her IgG level was 210 with normal being [phone].  IgA level was 7 with normal being 69-380.  The IgM level was 5 with normal being  52-322.  Quantitative immunoglobulins were last checked on 08/12/2011.  IMAGING STUDIES:  1. CT scan of the chest with IV contrast on 08/01/2009 showed a  calcified superior mesenteric mass closely related to the  esophagus, possibly related to calcified lymph nodes or a  submucosal esophageal mass. There were patchy airspace filling  opacities. Differential diagnosis includes a community-acquired  infectious process, atypical infectious process, bronchiolitis  obliterans with organizing pneumonia, pulmonary hemorrhage,  bronchoalveolar cell carcinoma. CT scan of the chest with IV  contrast on 09/19/2009 shows interval improvement in multifocal  airspace consolidation, most likely result of interval steroid  therapy. There were borderline enlarged low internal jugular,  mediastinal, axillary and upper abdominal lymph nodes.  2. CT maxillofacial without contrast carried out on 06/06/2011 showed  the paranasal sinuses were clear with no radiographic evidence for  sinusitis. There was stable mild generalized atrophy and white  matter disease.  3. Chest 2-view from 08/12/2011 showed no active cardiopulmonary  disease.  4. Screening bilateral mammogram from 11/18/2011 showed no  mammographic evidence for malignancy. Next screening mammogram was  recommended to be in 1 year.  5. Limited abdominal ultrasound on  01/15/2012 showed mild splenomegaly. The spleen measured 14.6 x 15.3 x  5.6 cm. Splenic volume was calculated to be 664 mL. There were no  focal splenic abnormalities. There was no hydronephrosis. 6. Chest x-ray, 2-view, from 08/16/2012 showed a new ill-defined left upper lobe nodular opacity overlying the anterior left 3rd rib compared with the chest x-ray of 08/12/2011.  A chest CT scan was recommended for further evaluation.   IMPRESSION AND PLAN:  In general, the patient seems to be doing well. She denied nausea today.  She has lost a couple more pounds.  White count is a  little higher; otherwise, no major changes.  Chest x-ray today will need to be reviewed.  I will need to decide whether we need to go ahead with a chest CT scan as recommended.  Once again, I have tried to recommend nutritional supplements to the patient.  She is really without any major complaints today.  We will plan to see her again in 2 months, at which time we will check CBC, chemistries, LDH, and uric acid.  At the present time we are holding off on any treatment.  In fact, the patient has never received any treatment for her chronic lymphocytic leukemia.    ______________________________ Samul Dada, M.D. DSM/MEDQ  D:  08/16/2012  T:  08/17/2012  Job:  [id number]

## 2012-08-18 ENCOUNTER — Telehealth: Payer: Self-pay | Admitting: Medical Oncology

## 2012-08-18 ENCOUNTER — Telehealth: Payer: Self-pay | Admitting: Oncology

## 2012-08-18 NOTE — Telephone Encounter (Signed)
S/w pt re ct for 10/21.

## 2012-08-18 NOTE — Telephone Encounter (Signed)
I called pt per Dr. Arline Asp to let her know that he review the chest x-ray and he feels we need to further evaluate the nodular opacity further. She agrees and is aware the schedulers will call her with an appointment.

## 2012-08-23 ENCOUNTER — Encounter (HOSPITAL_COMMUNITY): Payer: Self-pay

## 2012-08-23 ENCOUNTER — Ambulatory Visit (HOSPITAL_COMMUNITY)
Admission: RE | Admit: 2012-08-23 | Discharge: 2012-08-23 | Disposition: A | Discharge status: Home / Self Care | Payer: Medicare Other | Source: Ambulatory Visit | Attending: Oncology | Admitting: Oncology

## 2012-08-23 DIAGNOSIS — R599 Enlarged lymph nodes, unspecified: Secondary | ICD-10-CM | POA: Insufficient documentation

## 2012-08-23 DIAGNOSIS — R9389 Abnormal findings on diagnostic imaging of other specified body structures: Secondary | ICD-10-CM

## 2012-08-23 DIAGNOSIS — R918 Other nonspecific abnormal finding of lung field: Secondary | ICD-10-CM | POA: Insufficient documentation

## 2012-08-23 MED ORDER — IOHEXOL 300 MG/ML  SOLN
80.0000 mL | Freq: Once | INTRAMUSCULAR | Status: AC | PRN
  Administered 2012-08-23: 80 mL via INTRAVENOUS

## 2012-08-26 ENCOUNTER — Encounter: Payer: Self-pay | Admitting: Oncology

## 2012-08-26 ENCOUNTER — Telehealth: Payer: Self-pay | Admitting: Medical Oncology

## 2012-08-26 ENCOUNTER — Other Ambulatory Visit: Payer: Self-pay | Admitting: Medical Oncology

## 2012-08-26 NOTE — Progress Notes (Unsigned)
The CT scan of the chest with IV contrast from 08/23/2012 was reviewed. The patient has an area of pleural thickening/nodularity in the posterior left hemithorax. On the CT scan of 09/19/2009, this area was normal and no mass was present. Potential etiologies include a lymphomatous lesion, lung cancer or a benign fibrous lesion.  This lesion was noted on the chest x-ray from 08/16/2012 and thus we could follow this lesion by chest x-ray or repeat CT scan. A core needle biopsy probably could be easily obtained if the patient is uncomfortable with observation.  We will need to discuss this with her. We will arrange for an appointment in the next week or so.  In looking over her scans, it appears that the patient has never had a CT scan of abdomen and pelvis. We may wish to consider those scans as well.

## 2012-08-26 NOTE — Telephone Encounter (Signed)
I called pt and left her a message that Dr. Arline Asp would like for her to come in so he can discuss her CT results. I asked her to call me back.

## 2012-08-27 ENCOUNTER — Encounter: Payer: Self-pay | Admitting: Oncology

## 2012-08-27 ENCOUNTER — Telehealth: Payer: Self-pay | Admitting: Oncology

## 2012-08-27 ENCOUNTER — Ambulatory Visit (HOSPITAL_BASED_OUTPATIENT_CLINIC_OR_DEPARTMENT_OTHER): Payer: Medicare Other | Admitting: Oncology

## 2012-08-27 VITALS — BP 149/63 | HR 65 | Temp 96.9°F | Resp 20 | Ht 60.0 in | Wt 123.3 lb

## 2012-08-27 DIAGNOSIS — C9110 Chronic lymphocytic leukemia of B-cell type not having achieved remission: Secondary | ICD-10-CM

## 2012-08-27 DIAGNOSIS — D801 Nonfamilial hypogammaglobulinemia: Secondary | ICD-10-CM

## 2012-08-27 DIAGNOSIS — D696 Thrombocytopenia, unspecified: Secondary | ICD-10-CM

## 2012-08-27 DIAGNOSIS — R911 Solitary pulmonary nodule: Secondary | ICD-10-CM

## 2012-08-27 NOTE — Progress Notes (Signed)
CC:   Angelica Pounds, MD Oretha Milch, MD Antony Contras, MD  PROBLEM LIST:  1. 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  bronchiolitis obliterans with organizing pneumonia. There was  suggestion that the patient had an elevated white count extending  back even 2 or 3 years before that so the patient's diagnosis of  CLL may actually go back to 2007 or 2008. She has not received any  treatments to date for her CLL. Splenomegaly noted on ultrasound of  the abdomen on 01/15/2012. Spleen measured 664 mL.  2. Hypogammaglobulinemia.  3. Thrombocytopenia.  4. Chronic rhinitis.  5. Hypertension.  6. Dyslipidemia.  7. PPD positivity.  8. History of migraine headaches.  9. Osteoarthritis.  10. Soft systolic ejection murmur.  11. Weight loss.  12. Fibromyalgia.  13. Irritable bowel syndrome.  14. Pleural mass/thickening, measuring 1.2 x 1.1 cm, within the posterior left hemithorax on image 22 of chest CT scan from 08/23/2012 and chest x- ray from 08/16/2012.  MEDICATIONS:  1. Lipitor 20 mg daily.  2. Echinacea 400 mg twice daily.  3. Fish oil omega-3 fatty acids 2 g daily.  4. Saline nasal irrigations twice daily.  5. Glucosamine-chondroitin 500-400 one tablet daily.  6. Claritin 10 mg daily.  7. Toprol-XL 12.5 mg daily.  8. Singulair 10 mg daily.  9. Multivitamins 1 daily.  10.Maxzide 25 one daily.  11.Ambien 5 mg at bedtime.    Pneumovax given in 2004 and again in spring of 2011.  Influenza vaccine was given most recently in September 2013. The patient  receives this every fall.    SMOKING HISTORY: The patient has never smoked cigarettes.     HISTORY:  I am seeing Angelica Sullivan and her husband, [name], today for a work-in appointment following a CT scan of the chest carried out with IV contrast on 08/23/2012 that followed up on the abnormal chest x-ray from 08/16/2012.  The CT scan shows a  pleural-based mass, measuring 1.2 x 1.1 cm, within the posterior left hemithorax on image 22.  The patient was last seen by [place] on 08/16/2012.  She denies any changes in her condition. She denies any fever, pleuritic chest pain, or other respiratory symptoms.  Today's session was spent mostly in discussion and reviewing the images of her CT scan.  I had already reviewed the CT scan with the radiologist.  Today's visit lasted approximately 30 minutes.  PHYSICAL EXAMINATION:  There is no significant change.  Weight today is 123.3 Sullivan, height 5 feet even, body surface area 1.54 meters squared. Blood pressure 149/63.  Other vital signs are normal.  LABORATORY DATA:  None was carried out today.  IMAGING STUDIES:  1. CT scan of the chest with IV contrast on 08/01/2009 showed a  calcified superior mesenteric mass closely related to the  esophagus, possibly related to calcified lymph nodes or a  submucosal esophageal mass. There were patchy airspace filling  opacities. Differential diagnosis includes a community-acquired  infectious process, atypical infectious process, bronchiolitis  obliterans with organizing pneumonia, pulmonary hemorrhage,  bronchoalveolar cell carcinoma. CT scan of the chest with IV  contrast on 09/19/2009 shows interval improvement in multifocal  airspace consolidation, most likely result of interval steroid  therapy. There were borderline enlarged low internal jugular,  mediastinal, axillary and upper abdominal lymph nodes.  2. CT maxillofacial without contrast carried out on 06/06/2011 showed  the paranasal sinuses were clear with no  radiographic evidence for  sinusitis. There was stable mild generalized atrophy and white  matter disease.  3. Chest 2-view from 08/12/2011 showed no active cardiopulmonary  disease.  4. Screening bilateral mammogram from 11/18/2011 showed no  mammographic evidence for malignancy. Next screening mammogram was  recommended to be in 1  year.  5. Limited abdominal ultrasound on  01/15/2012 showed mild splenomegaly. The spleen measured 14.6 x 15.3 x  5.6 cm. Splenic volume was calculated to be 664 mL. There were no  focal splenic abnormalities. There was no hydronephrosis.  6. Chest x-ray, 2-view, from 08/16/2012 showed a new ill-defined left upper  lobe nodular opacity overlying the anterior left 3rd rib compared with  the chest x-ray of 08/12/2011. A chest CT scan was recommended for  further evaluation. 7. CT scan of the chest with IV contrast on 08/23/2012 shows pleural thickening or mass, measuring 1.2 x 1.1 cm, within the posterior left hemithorax on image 22.  There was no significant underlying rib abnormality.  There were calcified nodes within the middle mediastinum without hilar adenopathy.  There were bilateral lower jugular and supraclavicular lymph nodes.  A left supraclavicular lymph node appears slightly increased since the prior study of 09/19/2009.  Lung windows demonstrated minimal ground-glass opacity within the right apex, new since the prior CT scan.  There is axillary adenopathy which seems similar to the CT scan carried out on 09/19/2009.   IMPRESSION AND PLAN:  Today's session was spent in discussion and going over the CT scan with the patient and her husband.  The patient is asymptomatic.  Etiologies for this mass would include something benign like fibrosis, perhaps some inflammation, CLL or some lymphoid deposit, or something unexpected such as lung cancer or mesothelioma.  I am most suspicious that this may be part and parcel of the patient's lymphoproliferative disorder.  I discussed with the patient and her husband the plan for proceeding, whether the patient fell comfortable following this or whether she desired to have a core needle biopsy carried out sometime soon.  I think she is comfortable with careful observation.  In looking through the patient's records, I do not see where we  have carried out a CT scan of the abdomen and pelvis.  At this point we probably ought to obtain a baseline view of the abdomen and pelvis given my suspicion that the pleural-based mass in the left hemithorax could be related to the patient's lymphoproliferative disorder.  We will go ahead with CT scans of abdomen and pelvis.  The patient has an appointment to see me again on 10/19/2012 for a more thorough evaluation.  Our plan is to probably repeat the CT scan of the chest without IV contrast sometime in January, which would be approximately 3 months.    ______________________________ Samul Dada, M.D. DSM/MEDQ  D:  08/27/2012  T:  08/27/2012  Job:  [id number]

## 2012-08-27 NOTE — Telephone Encounter (Signed)
Printed a and [facility]v pt appt schedule for OCT

## 2012-08-27 NOTE — Progress Notes (Signed)
This office note has been dictated.  #[id number]

## 2012-08-30 ENCOUNTER — Other Ambulatory Visit: Payer: Self-pay | Admitting: Oncology

## 2012-08-30 ENCOUNTER — Ambulatory Visit (HOSPITAL_COMMUNITY)
Admission: RE | Admit: 2012-08-30 | Discharge: 2012-08-30 | Disposition: A | Discharge status: Home / Self Care | Payer: Medicare Other | Source: Ambulatory Visit | Attending: Oncology | Admitting: Oncology

## 2012-08-30 DIAGNOSIS — C9110 Chronic lymphocytic leukemia of B-cell type not having achieved remission: Secondary | ICD-10-CM | POA: Insufficient documentation

## 2012-08-30 MED ORDER — IOHEXOL 300 MG/ML  SOLN
100.0000 mL | Freq: Once | INTRAMUSCULAR | Status: AC | PRN
  Administered 2012-08-30: 100 mL via INTRAVENOUS

## 2012-10-19 ENCOUNTER — Other Ambulatory Visit (HOSPITAL_BASED_OUTPATIENT_CLINIC_OR_DEPARTMENT_OTHER): Payer: Medicare Other | Admitting: Lab

## 2012-10-19 ENCOUNTER — Ambulatory Visit (HOSPITAL_BASED_OUTPATIENT_CLINIC_OR_DEPARTMENT_OTHER): Payer: Medicare Other | Admitting: Oncology

## 2012-10-19 ENCOUNTER — Encounter: Payer: Self-pay | Admitting: Oncology

## 2012-10-19 ENCOUNTER — Telehealth: Payer: Self-pay | Admitting: Oncology

## 2012-10-19 VITALS — BP 142/53 | HR 70 | Temp 97.0°F | Resp 18 | Ht 60.0 in | Wt 120.6 lb

## 2012-10-19 DIAGNOSIS — C9110 Chronic lymphocytic leukemia of B-cell type not having achieved remission: Secondary | ICD-10-CM

## 2012-10-19 DIAGNOSIS — R9389 Abnormal findings on diagnostic imaging of other specified body structures: Secondary | ICD-10-CM

## 2012-10-19 LAB — CBC WITH DIFFERENTIAL/PLATELET
BASO%: 0.3 % (ref 0.0–2.0)
EOS%: 0.4 % (ref 0.0–7.0)
HCT: 36.1 % (ref 34.8–46.6)
LYMPH%: 94.5 % — ABNORMAL HIGH (ref 14.0–49.7)
MCH: 29.4 pg (ref 25.1–34.0)
MCHC: 32.1 g/dL (ref 31.5–36.0)
MONO#: 1.7 10*3/uL — ABNORMAL HIGH (ref 0.1–0.9)
NEUT%: 3.1 % — ABNORMAL LOW (ref 38.4–76.8)
RBC: 3.94 10*6/uL (ref 3.70–5.45)
WBC: 97.7 10*3/uL (ref 3.9–10.3)
lymph#: 92.3 10*3/uL — ABNORMAL HIGH (ref 0.9–3.3)

## 2012-10-19 LAB — COMPREHENSIVE METABOLIC PANEL (CC13)
ALT: 20 U/L (ref 0–55)
AST: 23 U/L (ref 5–34)
Chloride: 104 mEq/L (ref 98–107)
Creatinine: 1.1 mg/dL (ref 0.6–1.1)
Sodium: 138 mEq/L (ref 136–145)
Total Bilirubin: 0.51 mg/dL (ref 0.20–1.20)
Total Protein: 5.8 g/dL — ABNORMAL LOW (ref 6.4–8.3)

## 2012-10-19 NOTE — Progress Notes (Signed)
This office note has been dictated.  #[id number]

## 2012-10-19 NOTE — Telephone Encounter (Signed)
[facility] appt schedule to pt for Feb 2014...advised pt central scheduling will call with d.t of ct...the patient aware

## 2012-10-20 ENCOUNTER — Other Ambulatory Visit: Payer: Self-pay | Admitting: Internal Medicine

## 2012-10-20 DIAGNOSIS — Z1231 Encounter for screening mammogram for malignant neoplasm of breast: Secondary | ICD-10-CM

## 2012-10-20 NOTE — Progress Notes (Signed)
CC:   [name], MD Oretha Milch, MD Antony Contras, MD    PROBLEM LIST:  1. 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  bronchiolitis obliterans with organizing pneumonia. There was  suggestion that the patient had an elevated white count extending  back even 2 or 3 years before that so the patient's diagnosis of  CLL may actually go back to 2007 or 2008. She has not received any  treatments to date for her CLL. Splenomegaly noted on ultrasound of  the abdomen on 01/15/2012. Spleen measured 664 mL.  2. Hypogammaglobulinemia.  3. Thrombocytopenia.  4. Chronic rhinitis.  5. Hypertension.  6. Dyslipidemia.  7. PPD positivity.  8. History of migraine headaches.  9. Osteoarthritis.  10. Soft systolic ejection murmur.  11. Weight loss.  12. Fibromyalgia.  13. Irritable bowel syndrome.  14. Pleural mass/thickening, measuring 1.2 x 1.1 cm, within the posterior  left hemithorax on image 22 of chest CT scan from 08/23/2012 and chest x-  ray from 08/16/2012.     MEDICATIONS: were reviewed and recorded.  Current Outpatient Prescriptions  Medication Sig Dispense Refill  . atorvastatin (LIPITOR) 20 MG tablet Take 20 mg by mouth daily.        . calcium citrate-vitamin D (CITRACAL+D) 315-200 MG-UNIT per tablet Take 1 tablet by mouth 2 (two) times daily.      . Echinacea 400 MG CAPS Take 1 capsule by mouth 2 (two) times daily.        . fish oil-omega-3 fatty acids 1000 MG capsule Take 2 g by mouth daily.       Marland Kitchen glucosamine-chondroitin 500-400 MG tablet Take 1 tablet by mouth daily.        Marland Kitchen loratadine (CLARITIN) 10 MG tablet Take 10 mg by mouth daily.        . metoprolol succinate (TOPROL-XL) 25 MG 24 hr tablet Take 25 mg by mouth daily. Take 1/2 tab po daily       . montelukast (SINGULAIR) 10 MG tablet Take 10 mg by mouth daily.       . Multiple Vitamin (MULTIVITAMIN) tablet Take 1 tablet by mouth daily.         Marland Kitchen tiZANidine (ZANAFLEX) 4 MG tablet Take 4 mg by mouth 3 (three) times a week.      . triamterene-hydrochlorothiazide (MAXZIDE-25) 37.5-25 MG per tablet Take 1 tablet by mouth daily.        . vitamin C (ASCORBIC ACID) 500 MG tablet Take 500 mg by mouth daily.      Marland Kitchen zolpidem (AMBIEN) 5 MG tablet Take 5 mg by mouth at bedtime as needed.          Pneumovax given in 2004 and again in spring of 2011.  Influenza vaccine was given most recently in September 2013. The patient  receives this every fall.    SMOKING HISTORY: The patient has never smoked cigarettes.   HISTORY:  Angelica Sullivan was seen today for followup of her chronic lymphocytic leukemia which was diagnosed in October 2010.  Angelica Sullivan was last seen by [place] on 08/27/2012.  She is accompanied by her husband Angelica Sullivan. There have been no major changes in her condition.  She continues to use nasal rinses to prevent a recurrence of the chronic rhinitis and sinusitis that affected her about a year ago.  In general, the patient's condition is stable.  She underwent a CT scan of the  abdomen and pelvis with IV contrast on 08/30/2012.  There were no unexpected findings.  The patient does have enlarged lymph nodes that are scatted throughout the abdomen and pelvis.  There were no large masses.  The spleen was enlarged without splenic lesions.  PHYSICAL EXAMINATION:  General: There is little change apparent.  Vital signs:  Weight is 120.6 pounds, height 5 feet even, body surface area 1.52 m2.  Blood pressure 142/53.  Other vital signs are normal.  The patient is afebrile.  HEENT:  There is no scleral icterus.  Mouth and pharynx are benign.  There is no peripheral adenopathy palpable in the neck, supraclavicular, axillary or inguinal areas.  Heart and lungs: Normal.  I did not appreciate soft systolic ejection murmur that I have heard previously.  Breasts were not examined.  The patient does have regular yearly mammograms.  Abdomen:   Benign.  I was unable to appreciate a spleen edge or tip that I apparently felt on 04/12/2012. No hepatomegaly or abdominal masses.  Extremities:  No peripheral edema or clubbing.  The patient has what looks like some spider angiomata or at least some angiomatous lesions over her face.  Neurologic exam: Grossly normal.  LABORATORY DATA:  Today, white count 97.7, ANC 3.0, hemoglobin 11.6, hematocrit 36.1, platelets 78,000.  There are 3% neutrophils, 94% lymphocytes.  CBC:  Essentially stable.  Chemistries were notable for a total protein of 5.8 with albumin of 3.9, and thus globulins were 1.9. Liver function tests were normal including an LDH of 174.  Uric acid was 6.9 today.  IMAGING STUDIES:  1. CT scan of the chest with IV contrast on 08/01/2009 showed a  calcified superior mesenteric mass closely related to the  esophagus, possibly related to calcified lymph nodes or a  submucosal esophageal mass. There were patchy airspace filling  opacities. Differential diagnosis includes a community-acquired  infectious process, atypical infectious process, bronchiolitis  obliterans with organizing pneumonia, pulmonary hemorrhage,  bronchoalveolar cell carcinoma. CT scan of the chest with IV  contrast on 09/19/2009 shows interval improvement in multifocal  airspace consolidation, most likely result of interval steroid  therapy. There were borderline enlarged low internal jugular,  mediastinal, axillary and upper abdominal lymph nodes.  2. CT maxillofacial without contrast carried out on 06/06/2011 showed  the paranasal sinuses were clear with no radiographic evidence for  sinusitis. There was stable mild generalized atrophy and white  matter disease.  3. Chest 2-view from 08/12/2011 showed no active cardiopulmonary  disease.  4. Screening bilateral mammogram from 11/18/2011 showed no  mammographic evidence for malignancy. Next screening mammogram was  recommended to be in 1 year.  5.  Limited abdominal ultrasound on  01/15/2012 showed mild splenomegaly. The spleen measured 14.6 x 15.3 x  5.6 cm. Splenic volume was calculated to be 664 mL. There were no  focal splenic abnormalities. There was no hydronephrosis.  6. Chest x-ray, 2-view, from 08/16/2012 showed a new ill-defined left upper  lobe nodular opacity overlying the anterior left 3rd rib compared with  the chest x-ray of 08/12/2011. A chest CT scan was recommended for  further evaluation.  7. CT scan of the chest with IV contrast on 08/23/2012 shows pleural  thickening or mass, measuring 1.2 x 1.1 cm, within the posterior left  hemithorax on image 22. There was no significant underlying rib  abnormality. There were calcified nodes within the middle mediastinum  without hilar adenopathy. There were bilateral lower jugular and  supraclavicular lymph nodes. A left supraclavicular  lymph node appears  slightly increased since the prior study of 09/19/2009. Lung windows  demonstrated minimal ground-glass opacity within the right apex, new  since the prior CT scan. There is axillary adenopathy which seems  similar to the CT scan carried out on 09/19/2009. 8. CT scan of the abdomen and pelvis with IV contrast showed numerous enlarged lymph nodes seen throughout the mesentery, upper abdomen, retroperitoneum, pelvis and inguinal areas consistent with active chronic lymphocytic leukemia.  There was also splenomegaly.  The spleen measured 15.1 cm x 6.5 cm x 11.7 cm.  No splenic lesions or masses were seen.  There was a vague hypoattenuating lesion in the posterior segment of the right lobe measuring 12.5 mm. This was nonspecific.   IMPRESSION/PLAN:  The patient's condition remains stable as are her blood counts.  The patient is not under any treatment.  We will order a CT scan of the chest without IV contrast around January 31st and plan to see Angelica Sullivan again around December 16, 2012 at which time we will check CBC, CMET  and LDH.  The patient can be seen by Norina Buzzard on her next visit.    ______________________________ Samul Dada, M.D. DSM/MEDQ  D:  10/19/2012  T:  10/20/2012  Job:  [id number]

## 2012-11-29 ENCOUNTER — Ambulatory Visit: Payer: Medicare Other

## 2012-11-29 ENCOUNTER — Ambulatory Visit
Admission: RE | Admit: 2012-11-29 | Discharge: 2012-11-29 | Disposition: A | Discharge status: Home / Self Care | Payer: Medicare Other | Source: Ambulatory Visit | Attending: Internal Medicine | Admitting: Internal Medicine

## 2012-11-29 DIAGNOSIS — Z1231 Encounter for screening mammogram for malignant neoplasm of breast: Secondary | ICD-10-CM

## 2012-11-30 ENCOUNTER — Other Ambulatory Visit: Payer: Self-pay | Admitting: Internal Medicine

## 2012-11-30 DIAGNOSIS — R928 Other abnormal and inconclusive findings on diagnostic imaging of breast: Secondary | ICD-10-CM

## 2012-12-03 ENCOUNTER — Telehealth: Payer: Self-pay

## 2012-12-03 ENCOUNTER — Ambulatory Visit (HOSPITAL_COMMUNITY)
Admission: RE | Admit: 2012-12-03 | Discharge: 2012-12-03 | Disposition: A | Discharge status: Home / Self Care | Payer: Medicare Other | Source: Ambulatory Visit | Attending: Oncology | Admitting: Oncology

## 2012-12-03 DIAGNOSIS — C9190 Lymphoid leukemia, unspecified not having achieved remission: Secondary | ICD-10-CM | POA: Insufficient documentation

## 2012-12-03 DIAGNOSIS — R9389 Abnormal findings on diagnostic imaging of other specified body structures: Secondary | ICD-10-CM

## 2012-12-03 DIAGNOSIS — J984 Other disorders of lung: Secondary | ICD-10-CM | POA: Insufficient documentation

## 2012-12-03 DIAGNOSIS — R509 Fever, unspecified: Secondary | ICD-10-CM | POA: Insufficient documentation

## 2012-12-03 DIAGNOSIS — I319 Disease of pericardium, unspecified: Secondary | ICD-10-CM | POA: Insufficient documentation

## 2012-12-03 DIAGNOSIS — R0602 Shortness of breath: Secondary | ICD-10-CM | POA: Insufficient documentation

## 2012-12-03 DIAGNOSIS — R059 Cough, unspecified: Secondary | ICD-10-CM | POA: Insufficient documentation

## 2012-12-03 DIAGNOSIS — R161 Splenomegaly, not elsewhere classified: Secondary | ICD-10-CM | POA: Insufficient documentation

## 2012-12-03 DIAGNOSIS — R05 Cough: Secondary | ICD-10-CM | POA: Insufficient documentation

## 2012-12-03 DIAGNOSIS — Z8701 Personal history of pneumonia (recurrent): Secondary | ICD-10-CM | POA: Insufficient documentation

## 2012-12-03 NOTE — Telephone Encounter (Signed)
Angelica Sullivan stated that she is symptomatic of flu. She had fever last night of 101, non productive cough, sternal pain with coughing, chills/fever and body aches, she has nasal drainage. She denies vomiting and diarrhea. She saw a NP at Dr Ferd Hibbs office yesterday. She was given prescriptions for tamaflu and levoquin daily for 1 week.  Dr Arline Asp informed and no new orders received. CT of 12/03/12 faxed to Dr Timothy Lasso.

## 2012-12-03 NOTE — Telephone Encounter (Signed)
lvm on home and cell, the CT shows improvement on areas we are monitoring but there is a spot that looks like it could be pneumonia. Asked her to call us back and let us know if she is having any symptoms.

## 2012-12-03 NOTE — Telephone Encounter (Signed)
Faxed CT result from 1/31 to Dr Timothy Lasso per pt request

## 2012-12-10 ENCOUNTER — Ambulatory Visit
Admission: RE | Admit: 2012-12-10 | Discharge: 2012-12-10 | Disposition: A | Discharge status: Home / Self Care | Payer: Medicare Other | Source: Ambulatory Visit | Attending: Internal Medicine | Admitting: Internal Medicine

## 2012-12-10 DIAGNOSIS — R928 Other abnormal and inconclusive findings on diagnostic imaging of breast: Secondary | ICD-10-CM

## 2012-12-15 ENCOUNTER — Telehealth: Payer: Self-pay | Admitting: Oncology

## 2012-12-15 ENCOUNTER — Other Ambulatory Visit (HOSPITAL_BASED_OUTPATIENT_CLINIC_OR_DEPARTMENT_OTHER): Payer: Medicare Other | Admitting: Lab

## 2012-12-15 ENCOUNTER — Ambulatory Visit (HOSPITAL_BASED_OUTPATIENT_CLINIC_OR_DEPARTMENT_OTHER): Payer: Medicare Other | Admitting: Physician Assistant

## 2012-12-15 VITALS — BP 133/73 | HR 79 | Temp 97.6°F | Resp 18 | Ht 60.0 in | Wt 121.7 lb

## 2012-12-15 DIAGNOSIS — D696 Thrombocytopenia, unspecified: Secondary | ICD-10-CM

## 2012-12-15 DIAGNOSIS — C9110 Chronic lymphocytic leukemia of B-cell type not having achieved remission: Secondary | ICD-10-CM

## 2012-12-15 DIAGNOSIS — J189 Pneumonia, unspecified organism: Secondary | ICD-10-CM

## 2012-12-15 LAB — CBC WITH DIFFERENTIAL/PLATELET
BASO%: 0.4 % (ref 0.0–2.0)
EOS%: 0.2 % (ref 0.0–7.0)
HCT: 35.1 % (ref 34.8–46.6)
LYMPH%: 92.4 % — ABNORMAL HIGH (ref 14.0–49.7)
MCH: 29.3 pg (ref 25.1–34.0)
MCHC: 32.2 g/dL (ref 31.5–36.0)
NEUT%: 4.7 % — ABNORMAL LOW (ref 38.4–76.8)
Platelets: 106 10*3/uL — ABNORMAL LOW (ref 145–400)
RBC: 3.85 10*6/uL (ref 3.70–5.45)
WBC: 87.4 10*3/uL (ref 3.9–10.3)

## 2012-12-15 LAB — COMPREHENSIVE METABOLIC PANEL (CC13)
ALT: 21 U/L (ref 0–55)
AST: 23 U/L (ref 5–34)
Alkaline Phosphatase: 76 U/L (ref 40–150)
BUN: 18.7 mg/dL (ref 7.0–26.0)
Calcium: 9.9 mg/dL (ref 8.4–10.4)
Creatinine: 1 mg/dL (ref 0.6–1.1)
Total Bilirubin: 0.52 mg/dL (ref 0.20–1.20)

## 2012-12-15 LAB — LACTATE DEHYDROGENASE (CC13): LDH: 203 U/L (ref 125–245)

## 2012-12-15 NOTE — Progress Notes (Signed)
[facility]  [phone]   OFFICE PROGRESS NOTE  [name], MD Oretha Milch, MD  Antony Contras, MD   PATIENT IDENTIFICATION:  1. 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 bronchiolitis obliterans with organizing pneumonia. There was suggestion that the patient had an elevated white count extending  back even 2 or 3 years before that so the patient's diagnosis of CLL may actually go back to 2007 or 2008. She has not received any treatments to date for her CLL. Splenomegaly noted on ultrasound of the abdomen on 01/15/2012. Spleen measured 664 mL.  2. Hypogammaglobulinemia.  3. Thrombocytopenia.  4. Chronic rhinitis.  5. Hypertension.  6. Dyslipidemia.  7. PPD positivity.  8. History of migraine headaches.  9. Osteoarthritis.  10. Soft systolic ejection murmur.  11. Weight loss.  12. Fibromyalgia.  13. Irritable bowel syndrome.  14. Pleural mass/thickening, measuring 1.2 x 1.1 cm, within the posterior left hemithorax on image 22 of chest CT scan from 08/23/2012 and chest x- ray from 08/16/2012.  15.  LLL Pneumonia on 11/29/12    INTERVAL HISTORY:  Angelica Sullivan was seen today for followup of her chronic lymphocytic leukemia which was diagnosed in October 2010. Ms. Fidalgo was last seen by [place] on 10/19/12. She is accompanied by her husband [name]. There have been no major changes in her condition. She continues to use nasal rinses to prevent a recurrence of the chronic rhinitis and sinusitis that affected her about a year ago. In general, the patient's condition is stable. She underwent a CT scan of the abdomen and pelvis with IV contrast on 08/30/2012. There were no unexpected findings. The patient does have enlarged lymph nodes that are scatted throughout the abdomen and pelvis. There were no large masses. The spleen was enlarged without splenic lesions. In addition, she had a CT  of the chest with contrast  performed on 12/03/12 which demonstrated  stable lymphadenopathy attributed to chronic lymphocytic leukemia, focal pleural thickening posteriorly in the left hemithorax has slightly improved compared with the prior study on 08/23/12.  No adjacent rib abnormality or significant associated pleural effusion were seen.  A  new focal left lower lobe air space disease most consistent with pneumonia was found at the time.she was treated with Tamiflu for 5 days and Levaquin 400 mg daily for one week along with Tussionex and Mucinex with resolution of symptoms. She is due for a CXR at Dr. Dr Ferd Hibbs office on 12/22/12. She is up to date with mammograms. Her last one was done on 11/29/12. A suspicious right breast mass had been noted which required further investigation. She had a R breast ultrasound on 12/10/12, remarkable for right axillary and right upper outer quadrant lymph node cortical thickening, consistent with a history of chronic lymphocytic leukemia. On 12/13/2012, addendum written concluded that patient was BI-RADS CATEGORY 1: Negative.     MEDICATIONS: Current Outpatient Prescriptions  Medication Sig Dispense Refill  . atorvastatin (LIPITOR) 20 MG tablet Take 20 mg by mouth daily.        . calcium citrate-vitamin D (CITRACAL+D) 315-200 MG-UNIT per tablet Take 1 tablet by mouth 2 (two) times daily.      . Echinacea 400 MG CAPS Take 1 capsule by mouth 2 (two) times daily.        . fish oil-omega-3 fatty acids 1000 MG capsule Take 2 g by mouth daily.       Marland Kitchen  glucosamine-chondroitin 500-400 MG tablet Take 1 tablet by mouth daily.        Marland Kitchen loratadine (CLARITIN) 10 MG tablet Take 10 mg by mouth daily.        . metoprolol succinate (TOPROL-XL) 25 MG 24 hr tablet Take 25 mg by mouth daily. Take 1/2 tab po daily       . montelukast (SINGULAIR) 10 MG tablet Take 10 mg by mouth daily.       . Multiple Vitamin (MULTIVITAMIN) tablet Take 1 tablet by mouth daily.        Marland Kitchen tiZANidine  (ZANAFLEX) 4 MG tablet Take 4 mg by mouth 3 (three) times a week.      . triamterene-hydrochlorothiazide (MAXZIDE-25) 37.5-25 MG per tablet Take 1 tablet by mouth daily.        . vitamin C (ASCORBIC ACID) 500 MG tablet Take 500 mg by mouth daily.      Marland Kitchen zolpidem (AMBIEN) 5 MG tablet Take 5 mg by mouth at bedtime as needed.         No current facility-administered medications for this visit.    ALLERGIES:  is allergic to bactrim.  REVIEW OF SYSTEMS:  The rest of the 14-point review of system was negative.   Filed Vitals:   12/15/12 1043  BP: 133/73  Pulse: 79  Temp: 97.6 F (36.4 C)  Resp: 18   Wt Readings from Last 3 Encounters:  12/15/12 121 lb 11.2 oz (55.203 kg)  10/19/12 120 lb 9.6 oz (54.704 kg)  08/27/12 123 lb 4.8 oz (55.929 kg)      PHYSICAL EXAMINATION:   HEENT: There is no scleral icterus. Mouth and pharynx are benign. There is no peripheral adenopathy palpable in the neck, supraclavicular, axillary or inguinal areas. Heart and lungs: Normal. No murmurs.  Breasts were not examined. The patient does have regular yearly mammograms. Abdomen: Benign No splenomegaly.  No hepatomegaly or abdominal masses. Extremities: No peripheral edema or clubbing. The patient has what looks like some spider angiomata or at least some angiomatous lesions over her face. Neurologic exam: Grossly normal.      LABORATORY/RADIOLOGY DATA:   Recent Labs Lab 12/15/12 1031  WBC 87.4*  HGB 11.3*  HCT 35.1  PLT 106*  MCV 91.1  MCH 29.3  MCHC 32.2  RDW 14.9*  LYMPHSABS 80.7*  MONOABS 2.0*  EOSABS 0.2  BASOSABS 0.3*    On 10/19/2012  white count 97.7, ANC 3.0, hemoglobin 11.6, hematocrit 36.1, platelets 78,000. There are 3% neutrophils, 94%  lymphocytes. CBC: Essentially stable. Chemistries were notable for BUN and Cr of 17/1.1.     CMP    Recent Labs Lab 12/15/12 1031  NA 137  K 4.2  CL 101  CO2 30*  GLUCOSE 111*  BUN 18.7  CREATININE 1.0  CALCIUM 9.9  AST 23  ALT 21   ALKPHOS 76  BILITOT 0.52        Component Value Date/Time   BILITOT 0.52 12/15/2012 1031   BILITOT 0.6 06/14/2012 0851     Radiology Studies:  Ct Chest Wo Contrast  12/03/2012  .  Comparison: Chest CT 09/19/2009 and 08/23/2012.  Findings: Mild supraclavicular and axillary adenopathy bilaterally appears unchanged.  There are multiple calcified mediastinal lymph nodes which are unchanged.  There is no progressive adenopathy.  A small pericardial effusion appears stable.  There is no pleural effusion.  The previously demonstrated focal pleural thickening posteriorly in the left hemithorax is slightly less conspicuous, currently seen on axial images 19 -  22.  There is no adjacent rib erosion.  No other pleural densities are identified.  Ill-defined right upper lobe ground-glass opacity on image 12 is unchanged.  There is new focal air space disease with air bronchograms medially in the left lower lobe (images 39 - 46). There is mild adjacent ground-glass opacity.  The lungs are otherwise clear.  The visualized upper abdomen appears unchanged.  The spleen is mildly enlarged.  Mildly prominent retroperitoneal lymph nodes are unchanged.  IMPRESSION:  1.  Stable lymphadenopathy attributed to chronic lymphocytic leukemia. 2.  Focal pleural thickening posteriorly in the left hemithorax has slightly improved compared with the prior study.  No adjacent rib abnormality or significant associated pleural effusion. 3.  New focal left lower lobe air space disease most consistent with pneumonia.  Radiographic followup recommended.   Original Report Authenticated By: Carey Bullocks, M.D.    US Breast Right  12/13/2012  **ADDENDUM** CREATED: 12/13/2012 08:11:34  DIGITAL BREAST TOMOSYNTHESIS  Digital breast tomosynthesis images are acquired in two projections.  These images are reviewed in combination with the digital mammogram, confirming the findings below.   Addended by:  Littie Deeds. Judyann Munson, M.D. on 12/13/2012 08:11:34.   **END ADDENDUM** SIGNED BY: Littie Deeds. Judyann Munson, M.D.   12/10/2012  RIGHT BREAST ULTRASOUND  Comparison:  Prior mammograms  On physical exam, I palpate no abnormality in the right upper outer quadrant or axilla.  Findings: Ultrasound is performed, showing lymph nodes with cortical thickening in the right upper outer quadrant and right axilla, measuring normal sized short axis diameter.  IMPRESSION: Right axillary and right upper outer quadrant lymph node cortical thickening.  This is consistent with the patient provided history of chronic lymphocytic leukemia.  Follow-up is as per oncology.  RECOMMENDATION: Screening mammography is recommended in one year.  I have discussed the findings and recommendations with the patient. Results were also provided in writing at the conclusion of the visit.  BI-RADS CATEGORY 1:  Negative.   Original Report Authenticated By: Christiana Pellant, M.D.    Mm Digital Screening  11/29/2012  Comparison:  Previous exams.  FINDINGS:  ACR Breast Density Category 2: There is a scattered fibroglandular pattern.  In the right breast, a possible mass warrants further evaluation with spot compression views and possibly ultrasound.  In the left breast, no masses or malignant type calcifications are identified.  Images were processed with CAD.  IMPRESSION: Further evaluation is suggested for possible mass in the right breast.  RECOMMENDATION: Diagnostic mammogram and possibly ultrasound of the right breast. (Code:[id number])  The patient will be contacted regarding the findings, and additional imaging will be scheduled.  BI-RADS CATEGORY 0:  Incomplete.  Need additional imaging evaluation and/or prior mammograms for comparison.   Original Report Authenticated By: Baird Lyons, M.D.    PRIOR IMAGING STUDIES:  1. CT scan of the chest with IV contrast on 08/01/2009 showed a calcified superior mesenteric mass closely related to the esophagus, possibly related to calcified lymph nodes or a submucosal  esophageal mass. There were patchy airspace filling opacities. Differential diagnosis includes a community-acquired infectious process, atypical infectious process, bronchiolitis obliterans with organizing pneumonia, pulmonary hemorrhage,  bronchoalveolar cell carcinoma. CT scan of the chest with IV contrast on 09/19/2009 shows interval improvement in multifocal airspace consolidation, most likely result of interval steroid therapy. There were borderline enlarged low internal jugular, mediastinal, axillary and upper abdominal lymph nodes.  2. CT maxillofacial without contrast carried out on 06/06/2011 showed the paranasal sinuses were clear with no radiographic evidence  for  sinusitis. There was stable mild generalized atrophy and white matter disease. 3. Chest 2-view from 08/12/2011 showed no active cardiopulmonary  disease.  4. Screening bilateral mammogram from 11/18/2011 showed no mammographic evidence for malignancy. Next screening mammogram was  recommended to be in 1 year.  5. Limited abdominal ultrasound on 01/15/2012 showed mild splenomegaly. The spleen measured 14.6 x 15.3 x 5.6 cm. Splenic volume was calculated to be 664 mL. There were no focal splenic abnormalities. There was no hydronephrosis. 6. Chest x-ray, 2-view, from 08/16/2012 showed a new ill-defined left upper lobe nodular opacity overlying the anterior left 3rd rib compared with the chest x-ray of 08/12/2011. A chest CT scan was recommended for further evaluation.  7. CT scan of the chest with IV contrast on 08/23/2012 shows pleural thickening or mass, measuring 1.2 x 1.1 cm, within the posterior left  hemithorax on image 22. There was no significant underlying rib abnormality. There were calcified nodes within the middle mediastinum without hilar adenopathy. There were bilateral lower jugular and supraclavicular lymph nodes. A left supraclavicular lymph node appears slightly increased since the prior study of 09/19/2009. Lung windows  demonstrated minimal ground-glass opacity within the right apex, new since the prior CT scan. There is axillary adenopathy which seems similar to the CT scan carried out on 09/19/2009. 8. CT scan of the abdomen and pelvis with IV contrast showed numerous enlarged lymph nodes seen throughout the mesentery, upper abdomen, retroperitoneum, pelvis and inguinal areas consistent with active chronic lymphocytic leukemia. There was also splenomegaly. The spleen measured 15.1 cm x 6.5 cm x 11.7 cm. No splenic lesions or masses were seen. There was a vague hypoattenuating lesion in the posterior segment of the right lobe measuring 12.5 mm. This was nonspecific.     ASSESSMENT AND PLAN:  The patient's condition remains stable as are her blood counts. The patient is not under any treatment. Will plan to see Ms. Steffenhagen again around June of 2014  at which time we will check CBC, CMET and LDH.  Will check labs in 2 months in the interim, to monitor closely those counts. She knows to call us if she has any questions or concerns.

## 2012-12-15 NOTE — Patient Instructions (Signed)
You are doing well. We are to check blood in 2 months and you are to return to see Dr. Arline Asp in 4 months

## 2012-12-15 NOTE — Telephone Encounter (Signed)
gv and printed appt schedule for pt for April and June

## 2012-12-15 NOTE — Telephone Encounter (Signed)
Talked to patient they are coming now per ML

## 2012-12-16 ENCOUNTER — Ambulatory Visit: Payer: Medicare Other | Admitting: Physician Assistant

## 2012-12-16 ENCOUNTER — Other Ambulatory Visit: Payer: Self-pay | Admitting: Lab

## 2013-02-11 ENCOUNTER — Other Ambulatory Visit (HOSPITAL_BASED_OUTPATIENT_CLINIC_OR_DEPARTMENT_OTHER): Payer: Medicare Other | Admitting: Lab

## 2013-02-11 DIAGNOSIS — C9110 Chronic lymphocytic leukemia of B-cell type not having achieved remission: Secondary | ICD-10-CM

## 2013-02-11 DIAGNOSIS — D696 Thrombocytopenia, unspecified: Secondary | ICD-10-CM

## 2013-02-11 LAB — MANUAL DIFFERENTIAL
ALC: 110 10*3/uL — ABNORMAL HIGH (ref 0.9–3.3)
ANC (CHCC manual diff): 4.6 10*3/uL (ref 1.5–6.5)
Band Neutrophils: 0 % (ref 0–10)
Basophil: 0 % (ref 0–2)
Blasts: 0 % (ref 0–0)
Metamyelocytes: 0 % (ref 0–0)
PLT EST: DECREASED
PROMYELO: 0 % (ref 0–0)
nRBC: 0 % (ref 0–0)

## 2013-02-11 LAB — COMPREHENSIVE METABOLIC PANEL (CC13)
ALT: 21 U/L (ref 0–55)
CO2: 25 mEq/L (ref 22–29)
Calcium: 9.6 mg/dL (ref 8.4–10.4)
Chloride: 100 mEq/L (ref 98–107)
Creatinine: 1 mg/dL (ref 0.6–1.1)
Glucose: 106 mg/dl — ABNORMAL HIGH (ref 70–99)

## 2013-02-11 LAB — LACTATE DEHYDROGENASE (CC13): LDH: 170 U/L (ref 125–245)

## 2013-02-11 LAB — CBC WITH DIFFERENTIAL/PLATELET
HCT: 35.5 % (ref 34.8–46.6)
HGB: 10.9 g/dL — ABNORMAL LOW (ref 11.6–15.9)
MCH: 28.1 pg (ref 25.1–34.0)
MCV: 91.7 fL (ref 79.5–101.0)
Platelets: 72 10*3/uL — ABNORMAL LOW (ref 145–400)

## 2013-04-08 ENCOUNTER — Ambulatory Visit (HOSPITAL_BASED_OUTPATIENT_CLINIC_OR_DEPARTMENT_OTHER): Payer: Medicare Other | Admitting: Oncology

## 2013-04-08 ENCOUNTER — Telehealth: Payer: Self-pay | Admitting: Oncology

## 2013-04-08 ENCOUNTER — Encounter: Payer: Self-pay | Admitting: Oncology

## 2013-04-08 ENCOUNTER — Other Ambulatory Visit (HOSPITAL_BASED_OUTPATIENT_CLINIC_OR_DEPARTMENT_OTHER): Payer: Medicare Other | Admitting: Lab

## 2013-04-08 VITALS — BP 138/53 | HR 59 | Temp 97.0°F | Resp 18 | Ht 60.0 in | Wt 118.6 lb

## 2013-04-08 DIAGNOSIS — C9110 Chronic lymphocytic leukemia of B-cell type not having achieved remission: Secondary | ICD-10-CM

## 2013-04-08 DIAGNOSIS — D696 Thrombocytopenia, unspecified: Secondary | ICD-10-CM

## 2013-04-08 DIAGNOSIS — R918 Other nonspecific abnormal finding of lung field: Secondary | ICD-10-CM

## 2013-04-08 DIAGNOSIS — R9389 Abnormal findings on diagnostic imaging of other specified body structures: Secondary | ICD-10-CM

## 2013-04-08 LAB — CBC WITH DIFFERENTIAL/PLATELET
Basophils Absolute: 0.1 10*3/uL (ref 0.0–0.1)
HCT: 35.9 % (ref 34.8–46.6)
HGB: 11.4 g/dL — ABNORMAL LOW (ref 11.6–15.9)
MONO#: 1.2 10*3/uL — ABNORMAL HIGH (ref 0.1–0.9)
NEUT%: 2.6 % — ABNORMAL LOW (ref 38.4–76.8)
WBC: 134.2 10*3/uL (ref 3.9–10.3)
lymph#: 128.8 10*3/uL — ABNORMAL HIGH (ref 0.9–3.3)

## 2013-04-08 LAB — COMPREHENSIVE METABOLIC PANEL (CC13)
Albumin: 3.9 g/dL (ref 3.5–5.0)
BUN: 15.7 mg/dL (ref 7.0–26.0)
Calcium: 10.1 mg/dL (ref 8.4–10.4)
Chloride: 99 mEq/L (ref 98–107)
Glucose: 92 mg/dl (ref 70–99)
Potassium: 4.1 mEq/L (ref 3.5–5.1)

## 2013-04-08 NOTE — Telephone Encounter (Signed)
gv and printed appt sched and avs for pt  °

## 2013-04-08 NOTE — Progress Notes (Signed)
This office note has been dictated.  #[id number]

## 2013-04-09 NOTE — Progress Notes (Signed)
CC:   [name], MD Oretha Milch, MD Antony Contras, MD  PROBLEM LIST:  1. 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  bronchiolitis obliterans with organizing pneumonia. There was  suggestion that the patient had an elevated white count extending  back even 2 or 3 years before that so the patient's diagnosis of  CLL may actually go back to 2007 or 2008. She has not received any  treatments to date for her CLL. Splenomegaly noted on ultrasound of  the abdomen on 01/15/2012. Spleen measured 664 mL.  2. Hypogammaglobulinemia.  3. Thrombocytopenia.  4. Chronic rhinitis.  5. Hypertension.  6. Dyslipidemia.  7. PPD positivity.  8. History of migraine headaches.  9. Osteoarthritis.  10. Soft systolic ejection murmur.  11. Weight loss.  12. Fibromyalgia.  13. Irritable bowel syndrome.  14. Pleural mass/thickening, measuring 1.2 x 1.1 cm, within the posterior  left hemithorax on image 22 of chest CT scan from 08/23/2012 and chest x-  ray from 08/16/2012.    MEDICATIONS:  Reviewed and recorded. Current Outpatient Prescriptions  Medication Sig Dispense Refill  . atorvastatin (LIPITOR) 20 MG tablet Take 20 mg by mouth daily.        . calcium citrate-vitamin D (CITRACAL+D) 315-200 MG-UNIT per tablet Take 1 tablet by mouth 2 (two) times daily.      . Echinacea 400 MG CAPS Take 1 capsule by mouth 2 (two) times daily.        Marland Kitchen ESTRACE VAGINAL 0.1 MG/GM vaginal cream Place vaginally 2 (two) times a week.      . ferrous sulfate 325 (65 FE) MG tablet Take 325 mg by mouth daily.      . fish oil-omega-3 fatty acids 1000 MG capsule Take 2 g by mouth daily.       Marland Kitchen glucosamine-chondroitin 500-400 MG tablet Take 1 tablet by mouth daily.        Marland Kitchen loratadine (CLARITIN) 10 MG tablet Take 10 mg by mouth daily.        . metoprolol succinate (TOPROL-XL) 25 MG 24 hr tablet Take 25 mg by mouth daily. Take 1/2 tab po daily        . montelukast (SINGULAIR) 10 MG tablet Take 10 mg by mouth daily.       . Multiple Vitamin (MULTIVITAMIN) tablet Take 1 tablet by mouth daily.        Marland Kitchen tiZANidine (ZANAFLEX) 4 MG tablet Take 4 mg by mouth 3 (three) times a week.      . triamterene-hydrochlorothiazide (MAXZIDE-25) 37.5-25 MG per tablet Take 1 tablet by mouth daily.        Marland Kitchen zolpidem (AMBIEN) 5 MG tablet Take 5 mg by mouth at bedtime as needed.         No current facility-administered medications for this visit.     IMMUNIZATIONS:  Pneumovax given in 2004 and again in spring of 2011.  Influenza vaccine was given most recently in September 2013. The patient  receives this every fall.    SMOKING HISTORY: The patient has never smoked cigarettes.    HISTORY:  I am seeing Angelica Sullivan today for followup of her chronic lymphocytic leukemia, diagnosed in October 2010.  Ms. Skillman was last seen by [place] on 12/15/2012 and prior to that on 10/19/2012.  She is accompanied by her husband, [name].  The patient has done well over the past several months.  When we saw her  back in February, she had some pneumonia as seen by an infiltrate on a CT scan.  She was also symptomatic and apparently was treated with Levaquin.  She has recovered from that episode.  She seems to be getting along just fine over the past several months.  She actually feels stronger.  She continues to have a slight cough, which she attributes to allergies.  She has no fever or chills.  Appetite seems to be just fair.  She continues to irrigate her nose and sinuses twice a day.  She denies any congestion, but does have postnasal drip.  PHYSICAL EXAMINATION:  General:  The patient looks well.  Vital Signs: She has lost a couple of pounds since her last visit.  Her weight is 118 pounds 9.6 ounces, height 5 feet even, body surface area 1.51 meters squared.  Blood pressure 138/53.  Other vital signs are normal.  HEENT: There is no scleral icterus.  Mouth and pharynx  are benign.  No thrush. Lymphatic:  There is no peripheral adenopathy palpable in any of the lymph node groups.  Lungs:  Reveal perhaps a few soft rales at the right base.  Cardiac:  Regular rhythm, without murmur or rub, although I apparently heard a systolic ejection murmur in the past.  Breasts: Breasts are not examined.  The patient had recent mammograms.  Abdomen: Abdomen is benign except for possible spleen palpable in the left upper quadrant.  I really could not appreciate a spleen tip or edge.  This was just a sense of resistance.  No splenomegaly, abdominal masses, or ascites.  Extremities:  No peripheral edema or clubbing.  Neurologic: Exam is normal.  LABORATORY DATA TODAY:  White count 134.2, with an ANC of 3.4 and an ALC of 128.8.  Hemoglobin 11.4, hematocrit 35.9, platelets 72,000.  On 02/11/2013 white count was 114.6, and on 12/15/2012 white count was 87.4.  Chemistries today are essentially normal.  Sodium 133.  Albumin 3.9, LDH 188, and total protein 6.2.  Globulins are, therefore, 2.3. The patient does have hypogammaglobulinemia.  IMAGING STUDIES:  1. CT scan of the chest with IV contrast on 08/01/2009 showed a  calcified superior mesenteric mass closely related to the  esophagus, possibly related to calcified lymph nodes or a  submucosal esophageal mass. There were patchy airspace filling  opacities. Differential diagnosis includes a community-acquired  infectious process, atypical infectious process, bronchiolitis  obliterans with organizing pneumonia, pulmonary hemorrhage,  bronchoalveolar cell carcinoma. CT scan of the chest with IV  contrast on 09/19/2009 shows interval improvement in multifocal  airspace consolidation, most likely result of interval steroid  therapy. There were borderline enlarged low internal jugular,  mediastinal, axillary and upper abdominal lymph nodes.  2. CT maxillofacial without contrast carried out on 06/06/2011 showed  the paranasal  sinuses were clear with no radiographic evidence for  sinusitis. There was stable mild generalized atrophy and white  matter disease.  3. Chest 2-view from 08/12/2011 showed no active cardiopulmonary  disease.  4. Screening bilateral mammogram from 11/18/2011 showed no  mammographic evidence for malignancy. Next screening mammogram was  recommended to be in 1 year.  5. Limited abdominal ultrasound on  01/15/2012 showed mild splenomegaly. The spleen measured 14.6 x 15.3 x  5.6 cm. Splenic volume was calculated to be 664 mL. There were no  focal splenic abnormalities. There was no hydronephrosis.  6. Chest x-ray, 2-view, from 08/16/2012 showed a new ill-defined left upper  lobe nodular opacity overlying the anterior left 3rd rib  compared with  the chest x-ray of 08/12/2011. A chest CT scan was recommended for  further evaluation.  7. CT scan of the chest with IV contrast on 08/23/2012 shows pleural  thickening or mass, measuring 1.2 x 1.1 cm, within the posterior left  hemithorax on image 22. There was no significant underlying rib  abnormality. There were calcified nodes within the middle mediastinum  without hilar adenopathy. There were bilateral lower jugular and  supraclavicular lymph nodes. A left supraclavicular lymph node appears  slightly increased since the prior study of 09/19/2009. Lung windows  demonstrated minimal ground-glass opacity within the right apex, new  since the prior CT scan. There is axillary adenopathy which seems  similar to the CT scan carried out on 09/19/2009.  8. CT scan of the abdomen and pelvis with IV contrast on 10/30/2012 showed numerous enlarged lymph nodes seen throughout the  mesentery, upper abdomen, retroperitoneum, pelvis and inguinal areas  consistent with active chronic lymphocytic leukemia. There was also  splenomegaly. The spleen measured 15.1 cm x 6.5 cm x 11.7 cm. No  splenic lesions or masses were seen. There was a vague hypoattenuating   lesion in the posterior segment of the right lobe measuring 12.5 mm.  This was nonspecific. 9. CT scan of the chest without IV contrast on 12/03/2012 showed that     the previously noted focal pleural thickening posteriorly in the     left hemithorax was slightly less conspicuous when compared with     the prior CT scan of 08/23/2012.  There was an ill-defined right     upper lobe ground-glass opacity seen on image 12 that was felt to     be unchanged, and there was a new focal airspace disease with air     bronchograms medially in the left lower lobe, with mild adjacent     ground-glass opacity. 10. Ultrasound of the right breast was carried out on 12/10/2012.     Digital diagnostic right mammogram was also carried out.     Ultimately it was concluded that it was a right axillary and right     upper outer quadrant lymph node with cortical thickening consistent     with the patient's diagnosis of chronic lymphocytic leukemia. 11. Digital breast tomosynthesis was carried out on 01/05/2013.     Digital breast tomosynthesis images were acquired in 2 projections     and once again showed right axillary/right upper outer quadrant     lymph node cortical thickening consistent with the patient's     diagnosis of chronic lymphocytic leukemia.  Screening mammography     was recommended in 1 year.   IMPRESSION AND PLAN:  The patient's clinical condition remains stable, if not improved.  Of concern is her rising lymphocyte count. Hemoglobin, hematocrit, and platelets remain more or less stable.  The patient, fortunately, has no peripheral adenopathy and, in fact, she did not have major enlargement of abdominal lymph nodes seen on the CT scan of the abdomen and pelvis carried out on 08/30/2012.  The patient seems to be getting along well.  Once again we talked about treatment.  The patient really is not anxious to have treatment nor am I anxious to prescribe it for her, given the fact that she  only has an elevated white count.  Symptomatically she is doing well.  I, therefore, see no indications for treatment as long as the patient is doing so well and there are no impending problems.  The patient was  asking me about vaccines.  In general, we recommend against any live vaccine in patients who are immunocompromised.  We will plan to check a CBC in 2 months.  We will plan to see the patient again in 4 months, at which time we will check CBC and chemistries.  One week prior to her appointment, sometime in late September, we will obtain another CT scan of the chest without IV contrast, basically to follow up on the pleural-based mass seen in the posterior left hemithorax.    ______________________________ Samul Dada, M.D. DSM/MEDQ  D:  04/08/2013  T:  04/09/2013  Job:  [id number]

## 2013-06-03 ENCOUNTER — Other Ambulatory Visit (HOSPITAL_BASED_OUTPATIENT_CLINIC_OR_DEPARTMENT_OTHER): Payer: Medicare Other

## 2013-06-03 DIAGNOSIS — C9110 Chronic lymphocytic leukemia of B-cell type not having achieved remission: Secondary | ICD-10-CM

## 2013-06-03 LAB — CBC WITH DIFFERENTIAL/PLATELET
BASO%: 0.2 % (ref 0.0–2.0)
Eosinophils Absolute: 0.5 10*3/uL (ref 0.0–0.5)
MCV: 95.1 fL (ref 79.5–101.0)
MONO%: 2.5 % (ref 0.0–14.0)
NEUT#: 1.9 10*3/uL (ref 1.5–6.5)
RBC: 3.81 10*6/uL (ref 3.70–5.45)
RDW: 15 % — ABNORMAL HIGH (ref 11.2–14.5)
WBC: 130.9 10*3/uL (ref 3.9–10.3)

## 2013-06-03 LAB — TECHNOLOGIST REVIEW

## 2013-06-08 ENCOUNTER — Other Ambulatory Visit: Payer: Self-pay

## 2013-07-29 ENCOUNTER — Other Ambulatory Visit: Payer: Self-pay | Admitting: Internal Medicine

## 2013-07-29 DIAGNOSIS — C9110 Chronic lymphocytic leukemia of B-cell type not having achieved remission: Secondary | ICD-10-CM

## 2013-08-01 ENCOUNTER — Encounter (HOSPITAL_COMMUNITY): Payer: Self-pay

## 2013-08-01 ENCOUNTER — Ambulatory Visit (HOSPITAL_COMMUNITY)
Admission: RE | Admit: 2013-08-01 | Discharge: 2013-08-01 | Disposition: A | Discharge status: Home / Self Care | Payer: Medicare Other | Source: Ambulatory Visit | Attending: Oncology | Admitting: Oncology

## 2013-08-01 ENCOUNTER — Other Ambulatory Visit (HOSPITAL_BASED_OUTPATIENT_CLINIC_OR_DEPARTMENT_OTHER): Payer: Medicare Other | Admitting: Lab

## 2013-08-01 DIAGNOSIS — M47814 Spondylosis without myelopathy or radiculopathy, thoracic region: Secondary | ICD-10-CM | POA: Insufficient documentation

## 2013-08-01 DIAGNOSIS — R9389 Abnormal findings on diagnostic imaging of other specified body structures: Secondary | ICD-10-CM

## 2013-08-01 DIAGNOSIS — C9110 Chronic lymphocytic leukemia of B-cell type not having achieved remission: Secondary | ICD-10-CM | POA: Insufficient documentation

## 2013-08-01 DIAGNOSIS — R599 Enlarged lymph nodes, unspecified: Secondary | ICD-10-CM | POA: Insufficient documentation

## 2013-08-01 LAB — COMPREHENSIVE METABOLIC PANEL (CC13)
ALT: 18 U/L (ref 0–55)
AST: 23 U/L (ref 5–34)
Albumin: 3.7 g/dL (ref 3.5–5.0)
Alkaline Phosphatase: 53 U/L (ref 40–150)
Calcium: 9.9 mg/dL (ref 8.4–10.4)
Chloride: 103 mEq/L (ref 98–109)
Potassium: 4.2 mEq/L (ref 3.5–5.1)
Sodium: 136 mEq/L (ref 136–145)
Total Protein: 5.7 g/dL — ABNORMAL LOW (ref 6.4–8.3)

## 2013-08-01 LAB — CBC WITH DIFFERENTIAL/PLATELET
BASO%: 0.2 % (ref 0.0–2.0)
HCT: 34 % — ABNORMAL LOW (ref 34.8–46.6)
MCHC: 31.4 g/dL — ABNORMAL LOW (ref 31.5–36.0)
MONO#: 2.5 10*3/uL — ABNORMAL HIGH (ref 0.1–0.9)
NEUT%: 2.7 % — ABNORMAL LOW (ref 38.4–76.8)
RBC: 3.61 10*6/uL — ABNORMAL LOW (ref 3.70–5.45)
RDW: 16.2 % — ABNORMAL HIGH (ref 11.2–14.5)
WBC: 131.3 10*3/uL (ref 3.9–10.3)
lymph#: 124.5 10*3/uL — ABNORMAL HIGH (ref 0.9–3.3)

## 2013-08-01 LAB — LACTATE DEHYDROGENASE (CC13): LDH: 168 U/L (ref 125–245)

## 2013-08-08 ENCOUNTER — Telehealth: Payer: Self-pay | Admitting: Internal Medicine

## 2013-08-08 NOTE — Telephone Encounter (Signed)
Pt r/s from 10/20 to 10/22 per MD

## 2013-08-22 ENCOUNTER — Ambulatory Visit: Payer: Medicare Other

## 2013-08-24 ENCOUNTER — Ambulatory Visit (HOSPITAL_BASED_OUTPATIENT_CLINIC_OR_DEPARTMENT_OTHER): Payer: Medicare Other | Admitting: Internal Medicine

## 2013-08-24 ENCOUNTER — Telehealth: Payer: Self-pay | Admitting: *Deleted

## 2013-08-24 ENCOUNTER — Encounter: Payer: Self-pay | Admitting: Internal Medicine

## 2013-08-24 ENCOUNTER — Other Ambulatory Visit: Payer: Self-pay | Admitting: Internal Medicine

## 2013-08-24 VITALS — BP 146/61 | HR 69 | Temp 97.5°F | Resp 18 | Ht 60.0 in | Wt 117.6 lb

## 2013-08-24 DIAGNOSIS — C9110 Chronic lymphocytic leukemia of B-cell type not having achieved remission: Secondary | ICD-10-CM

## 2013-08-24 DIAGNOSIS — D649 Anemia, unspecified: Secondary | ICD-10-CM

## 2013-08-24 DIAGNOSIS — D696 Thrombocytopenia, unspecified: Secondary | ICD-10-CM

## 2013-08-24 NOTE — Patient Instructions (Signed)
Chronic Lymphocytic Leukemia Chronic lymphocytic leukemia (CLL) is a type of cancer of the bone marrow and blood cells. Bone marrow is the soft, spongy tissue inside your bone. In CLL, the bone marrow makes too many white blood cells that usually fight infection in the body (lymphocytes). CLL is the most common type of adult leukemia.  CAUSES  No one knows the exact cause of CLL. There is a higher risk of CLL in people who:   Are over 77 years of age.  Are white.  Are female.  Have a family history of CLL or other cancers of the lymph system.  Are of Russian Jewish or Eastern European Jewish descent.  Have been exposed to certain chemicals, such as Agent Orange (used in the Vietnam War) or other herbicides or insecticides. SYMPTOMS  At first, some people do not have symptoms of chronic lymphocytic leukemia. After a while, people may notice some symptoms, such as:   Feeling more tired than usual, even after rest.  Unplanned weight loss.  Heavy sweating at night.  Fevers.  Shortness of breath.  Decreased energy.  Paleness.  Painless, swollen lymph nodes.  A feeling of fullness in the upper left part of the abdomen.  Easy bruising and/or bleeding.  More frequent infections. DIAGNOSIS   During a physical exam, your caregiver may notice an enlarged spleen, liver and/or lymph nodes.  Blood and bone marrow tests are performed to identify the presence of cancer cells.  A CT scan may be done to look for swelling or abnormalities in your spleen, liver, and lymph nodes. TREATMENT  Treatment options for CLL depend on the stage and the presence of symptoms. There are a number of types of treatment used for this condition, including:  Targeted drugs. These are drugs that interfere with chemicals that leukemia cells need in order to grow and multiply.  Chemotherapy drugs. These medications kill cells that are multiplying quickly, such as leukemia cells.  Biological therapy. This  treatment boosts the ability of the patient's own immune system to fight the leukemia cells.  Bone marrow or peripheral blood stem cell transplant. This treatment allows the patient to receive very high doses of chemotherapy and/or radiation. These high doses kill the cancer cells, but also destroy the bone marrow. After treatment is complete, the patient is given donor bone marrow or stem cells, which will replace the bone marrow. HOME CARE INSTRUCTIONS   Because you have an increased risk of infection, practice good hand washing and avoid being around people who are ill or crowded places.  Because you have an increased risk of bleeding and bruising, avoid contact sports or other rough activities.  Only take over-the-counter or prescription medicines for pain, discomfort or fever as directed by your caregiver.  Although some of your treatments might affect your appetite, try to eat regular, healthy meals.  If you develop any side effects, such as nausea, diarrhea, rash, white patches in your mouth, a sore throat, difficulty swallowing, or severe fatigue, tell your caregiver. He or she may have recommendations of things you can do to improve symptoms.  Consider learning some ways to cope with the stress of having a chronic illness, such as yoga, meditation, or participating in a support group. SEEK IMMEDIATE MEDICAL CARE IF:  You develop an unexplained oral temperature of 102 F (38.9 C) or more.  You develop chest pains.  You develop a severe stiff neck or headache.  You have trouble breathing or feel short of breath.    You feel very lightheaded or pass out.  You notice pain, swelling or redness anywhere in your legs.  You have pain in your belly (abdomen).  You develop new bruises that are getting bigger.  You have painful or more swollen lymph nodes.  You develop bleeding from your gums, nose, or in your urine or stools.  You are unable to stop throwing up (vomiting).  You  cannot keep liquids down.  You feel depressed. Document Released: 03/08/2009 Document Revised: 01/12/2012 Document Reviewed: 03/08/2009 Exit[employer] Patient Information 2014 [employer].  

## 2013-08-24 NOTE — Telephone Encounter (Signed)
appts made and printed...td 

## 2013-08-26 DIAGNOSIS — D649 Anemia, unspecified: Secondary | ICD-10-CM | POA: Insufficient documentation

## 2013-08-26 NOTE — Progress Notes (Signed)
[facility] OFFICE PROGRESS NOTE  [name], MD [address] [facility], [place]. [place] [address]  DIAGNOSIS: Leukemia, chronic lymphoid - Plan: CBC with Differential in 2 months, Ferritin, Iron and TIBC  Thrombocytopenia - Plan: CBC with Differential in 2 months, Ferritin, Iron and TIBC  Anemia, unspecified  Chief Complaint  Patient presents with  . Leukemia, chronic lymphoid    CURRENT THERAPY:  Observation.  INTERVAL HISTORY: Angelica Sullivan 79 y.o. female with a history of  chronic lymphocytic leukemia, diagnosed in October 2010 is here for follow-up.  She was last seen by Dr. Arline Asp on 04/08/13.  She is  accompanied by her husband, [name]. The patient has done well over the past several months. She denies any recent admissions to the hospital or emergency room.  She denies fevers or chills or weight changes or night sweats.  She continues to irrigate her nose and sinuses twice a day. She denies any congestion, but does have postnasal drip.   MEDICAL HISTORY: Past Medical History  Diagnosis Date  . Hypertension   . Hyperlipidemia   . Irritable bowel   . Osteoporosis   . GERD (gastroesophageal reflux disease)   . Rhinitis, chronic 09/15/2011  . Thrombocytopenia 09/15/2011  . Leukemia 08/06/09    CLL  . 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   PROBLEM LIST:  1. 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  bronchiolitis obliterans with organizing pneumonia. There was  suggestion that the patient had an elevated white count extending  back even 2 or 3 years before that so the patient's diagnosis of  CLL may actually go back to 2007 or 2008. She has not received any treatments to date for her CLL. Splenomegaly noted on ultrasound of the abdomen on 01/15/2012. Spleen measured 664 mL.  2. Hypogammaglobulinemia.  3. Thrombocytopenia.  4. Chronic rhinitis.  5.  Hypertension.  6. Dyslipidemia.  7. PPD positivity.  8. History of migraine headaches.  9. Osteoarthritis.  10. Soft systolic ejection murmur.  11. Weight loss.  12. Fibromyalgia.  13. Irritable bowel syndrome.  14. Pleural mass/thickening, measuring 1.2 x 1.1 cm, within the posterior left hemithorax on image 22 of chest CT scan from 08/23/2012 and chest x-ray from 08/16/2012.   INTERIM HISTORY: has GERD; IRRITABLE BOWEL SYNDROME; FECAL OCCULT BLOOD; NONSPECIFIC ABN FINDING RAD & OTH EXAM GI TRACT; Leukemia, chronic lymphoid; Rhinitis, chronic; Thrombocytopenia; Abnormal chest x-ray; and Anemia, unspecified on her problem list.    ALLERGIES:  is allergic to bactrim.  MEDICATIONS: has a current medication list which includes the following prescription(s): atorvastatin, calcium citrate-vitamin d, echinacea, estrace vaginal, ferrous sulfate, fish oil-omega-3 fatty acids, glucosamine-chondroitin, loratadine, metoprolol succinate, montelukast, multivitamin, tizanidine, triamterene-hydrochlorothiazide, and zolpidem.  SURGICAL HISTORY: No past surgical history on file.  REVIEW OF SYSTEMS:   Constitutional: Denies fevers, chills or abnormal weight loss Eyes: Denies blurriness of vision Ears, nose, mouth, throat, and face: Denies mucositis or sore throat Respiratory: Denies cough, dyspnea or wheezes Cardiovascular: Denies palpitation, chest discomfort or lower extremity swelling Gastrointestinal:  Denies nausea, heartburn or change in bowel habits Skin: Denies abnormal skin rashes Lymphatics: Denies new lymphadenopathy or easy bruising Neurological:Denies numbness, tingling or new weaknesses Behavioral/Psych: Mood is stable, no new changes  All other systems were reviewed with the patient and are negative.  PHYSICAL EXAMINATION: ECOG PERFORMANCE STATUS: 0 - Asymptomatic  Blood pressure 146/61, pulse 69, temperature 97.5 F (36.4 C), temperature source Oral,  resp. rate 18, height 5' (1.524  m), weight 117 lb 9.6 oz (53.343 kg).  GENERAL:alert, no distress and comfortable; pleasant elderly female .  SKIN: skin color, texture, turgor are normal, no rashes or significant lesions EYES: normal, Conjunctiva are pink and non-injected, sclera clear OROPHARYNX:no exudate, no erythema and lips, buccal mucosa, and tongue normal  NECK: supple, thyroid normal size, non-tender, without nodularity LYMPH:  no palpable lymphadenopathy in the cervical, axillary or supraclavicular LUNGS: clear to auscultation and percussion with normal breathing effort HEART: regular rate & rhythm and no murmurs and no lower extremity edema ABDOMEN:abdomen soft, non-tender and normal bowel sounds; No organomegaly palpated. Musculoskeletal:no cyanosis of digits and no clubbing  NEURO: alert & oriented x 3 with fluent speech, no focal motor/sensory deficits  Labs:  Lab Results  Component Value Date   WBC 131.3* 08/01/2013   HGB 10.7* 08/01/2013   HCT 34.0* 08/01/2013   MCV 94.3 08/01/2013   PLT 66* 08/01/2013   NEUTROABS 3.5 08/01/2013      Chemistry      Component Value Date/Time   NA 136 08/01/2013 0758   NA 135 06/14/2012 0851   K 4.2 08/01/2013 0758   K 4.5 06/14/2012 0851   CL 99 04/08/2013 0922   CL 101 06/14/2012 0851   CO2 24 08/01/2013 0758   CO2 27 06/14/2012 0851   BUN 14.6 08/01/2013 0758   BUN 20 06/14/2012 0851   CREATININE 0.9 08/01/2013 0758   CREATININE 1.01 06/14/2012 0851      Component Value Date/Time   CALCIUM 9.9 08/01/2013 0758   CALCIUM 10.0 06/14/2012 0851   ALKPHOS 53 08/01/2013 0758   ALKPHOS 46 06/14/2012 0851   AST 23 08/01/2013 0758   AST 24 06/14/2012 0851   ALT 18 08/01/2013 0758   ALT 21 06/14/2012 0851   BILITOT 0.61 08/01/2013 0758   BILITOT 0.6 06/14/2012 0851     Studies:  No results found.  RADIOGRAPHIC STUDIES: 1. CT scan of the chest with IV contrast on 08/01/2009 showed a  calcified superior mesenteric mass closely related to the  esophagus, possibly related to  calcified lymph nodes or a  submucosal esophageal mass. There were patchy airspace filling  opacities. Differential diagnosis includes a community-acquired  infectious process, atypical infectious process, bronchiolitis  obliterans with organizing pneumonia, pulmonary hemorrhage,  bronchoalveolar cell carcinoma. CT scan of the chest with IV  contrast on 09/19/2009 shows interval improvement in multifocal  airspace consolidation, most likely result of interval steroid  therapy. There were borderline enlarged low internal jugular,  mediastinal, axillary and upper abdominal lymph nodes.  2. CT maxillofacial without contrast carried out on 06/06/2011 showed the paranasal sinuses were clear with no radiographic evidence for sinusitis. There was stable mild generalized atrophy and white matter disease.  3. Chest 2-view from 08/12/2011 showed no active cardiopulmonary disease.  4. Screening bilateral mammogram from 11/18/2011 showed no  mammographic evidence for malignancy. Next screening mammogram was recommended to be in 1 year.  5. Limited abdominal ultrasound on 01/15/2012 showed mild splenomegaly. The spleen measured 14.6 x 15.3 x  5.6 cm. Splenic volume was calculated to be 664 mL. There were no focal splenic abnormalities. There was no hydronephrosis.  6. Chest x-ray, 2-view, from 08/16/2012 showed a new ill-defined left upper lobe nodular opacity overlying the anterior left 3rd rib compared with the chest x-ray of 08/12/2011. A chest CT scan was recommended for further evaluation.  7. CT scan of the chest with IV contrast on  08/23/2012 shows pleural thickening or mass, measuring 1.2 x 1.1 cm, within the posterior left hemithorax on image 22. There was no significant underlying rib abnormality. There were calcified nodes within the middle mediastinum without hilar adenopathy. There were bilateral lower jugular and supraclavicular lymph nodes. A left supraclavicular lymph node appears slightly  increased since the prior study of 09/19/2009. Lung windows demonstrated minimal ground-glass opacity within the right apex, new since the prior CT scan. There is axillary adenopathy which seems similar to the CT scan carried out on 09/19/2009.  8. CT scan of the abdomen and pelvis with IV contrast on 10/30/2012 showed numerous enlarged lymph nodes seen throughout the mesentery, upper abdomen, retroperitoneum, pelvis and inguinal areas consistent with active chronic lymphocytic leukemia. There was also splenomegaly. The spleen measured 15.1 cm x 6.5 cm x 11.7 cm. No splenic lesions or masses were seen. There was a vague hypoattenuating lesion in the posterior segment of the right lobe measuring 12.5 mm. This was nonspecific.  9. CT scan of the chest without IV contrast on 12/03/2012 showed that the previously noted focal pleural thickening posteriorly in the  left hemithorax was slightly less conspicuous when compared with  the prior CT scan of 08/23/2012. There was an ill-defined right  upper lobe ground-glass opacity seen on image 12 that was felt to  be unchanged, and there was a new focal airspace disease with air  bronchograms medially in the left lower lobe, with mild adjacent  ground-glass opacity.  10. Ultrasound of the right breast was carried out on 12/10/2012.  Digital diagnostic right mammogram was also carried out.  Ultimately it was concluded that it was a right axillary and right  upper outer quadrant lymph node with cortical thickening consistent  with the patient's diagnosis of chronic lymphocytic leukemia.  11. Digital breast tomosynthesis was carried out on 01/05/2013.  Digital breast tomosynthesis images were acquired in 2 projections  and once again showed right axillary/right upper outer quadrant  lymph node cortical thickening consistent with the patient's  diagnosis of chronic lymphocytic leukemia. Screening mammography was recommended in 1 year. 12.Ct Chest Wo Contrast  08/01/2013   1.  Stable adenopathy attributed to chronic lymphocytic leukemia.  2. Resolution of focal pleural thickening posteriorly in the left hemi thorax.  3. Resolution of left lower lobe airspace disease.    ASSESSMENT: Angelica Sullivan 79 y.o. female with a history of Leukemia, chronic lymphoid - Plan: CBC with Differential in 2 months, Ferritin, Iron and TIBC  Thrombocytopenia - Plan: CBC with Differential in 2 months, Ferritin, Iron and TIBC  Anemia, unspecified   PLAN:  1. CLL. --The patient's clinical condition remains stable. Of concern is her rising lymphocyte count. Hemoglobin, hematocrit, and platelets remain more or less stable. The patient, fortunately, has no peripheral adenopathy and, in fact, she did not have major enlargement of abdominal lymph nodes seen on the CT scan  of the abdomen and pelvis carried out on 08/01/13. The patient seems to be getting along well.   --We discussed treatment options for CLL. The patient really is not anxious to have treatment.   Symptomatically she is doing well. I, therefore, see no indications for treatment as long as the patient is doing so well and there are no impending problems.   2. Follow-up.  We will plan to check a CBC in 2 months. We will plan to see the  patient again in 4 months, at which time we will check CBC and  chemistries.  All questions were answered. The patient knows to call the clinic with any problems, questions or concerns. We can certainly see the patient much sooner if necessary.  I spent 10 minutes counseling the patient face to face. The total time spent in the appointment was 15 minutes.    Parris Signer, MD 08/26/2013 5:52 AM

## 2013-09-08 ENCOUNTER — Other Ambulatory Visit: Payer: Self-pay

## 2013-09-14 ENCOUNTER — Telehealth: Payer: Self-pay | Admitting: Hematology and Oncology

## 2013-09-14 NOTE — Telephone Encounter (Signed)
Per message from [place] [facility] pt requesting transfer from DC to NG as she would like f/u w/a long term provider. Moved 12/26/13 f/u to NG @ 9am. Lb/fu for 12/26/13 same date/time. lmonvm for pt re change and confirmed 12/26/13 appts and 10/25/13 appt. Schedule mailed.

## 2013-10-25 ENCOUNTER — Other Ambulatory Visit (HOSPITAL_BASED_OUTPATIENT_CLINIC_OR_DEPARTMENT_OTHER): Payer: Medicare Other

## 2013-10-25 DIAGNOSIS — C9110 Chronic lymphocytic leukemia of B-cell type not having achieved remission: Secondary | ICD-10-CM

## 2013-10-25 DIAGNOSIS — D696 Thrombocytopenia, unspecified: Secondary | ICD-10-CM

## 2013-10-25 LAB — CBC WITH DIFFERENTIAL/PLATELET
BASO%: 0.1 % (ref 0.0–2.0)
Eosinophils Absolute: 0.4 10*3/uL (ref 0.0–0.5)
HCT: 28.8 % — ABNORMAL LOW (ref 34.8–46.6)
HGB: 9.6 g/dL — ABNORMAL LOW (ref 11.6–15.9)
LYMPH%: 94.4 % — ABNORMAL HIGH (ref 14.0–49.7)
MCHC: 33.4 g/dL (ref 31.5–36.0)
MCV: 95.7 fL (ref 79.5–101.0)
MONO#: 4 10*3/uL — ABNORMAL HIGH (ref 0.1–0.9)
MONO%: 2.6 % (ref 0.0–14.0)
NEUT#: 3.9 10*3/uL (ref 1.5–6.5)
NEUT%: 2.6 % — ABNORMAL LOW (ref 38.4–76.8)
Platelets: 54 10*3/uL — ABNORMAL LOW (ref 145–400)
RBC: 3.01 10*6/uL — ABNORMAL LOW (ref 3.70–5.45)
RDW: 17.9 % — ABNORMAL HIGH (ref 11.2–14.5)
WBC: 151.2 10*3/uL (ref 3.9–10.3)
lymph#: 142.7 10*3/uL — ABNORMAL HIGH (ref 0.9–3.3)

## 2013-10-25 LAB — TECHNOLOGIST REVIEW

## 2013-10-25 LAB — FERRITIN CHCC: Ferritin: 124 ng/ml (ref 9–269)

## 2013-10-25 LAB — IRON AND TIBC CHCC
Iron: 57 ug/dL (ref 41–142)
TIBC: 283 ug/dL (ref 236–444)

## 2013-10-28 ENCOUNTER — Encounter (HOSPITAL_COMMUNITY): Payer: Self-pay | Admitting: Emergency Medicine

## 2013-10-28 ENCOUNTER — Emergency Department (HOSPITAL_COMMUNITY): Payer: Medicare Other

## 2013-10-28 ENCOUNTER — Inpatient Hospital Stay (HOSPITAL_COMMUNITY)
Admission: EM | Admit: 2013-10-28 | Discharge: 2013-10-30 | DRG: 153 | Disposition: A | Discharge status: Home / Self Care | Payer: Medicare Other | Attending: Internal Medicine | Admitting: Internal Medicine

## 2013-10-28 DIAGNOSIS — Z79899 Other long term (current) drug therapy: Secondary | ICD-10-CM

## 2013-10-28 DIAGNOSIS — I10 Essential (primary) hypertension: Secondary | ICD-10-CM | POA: Diagnosis present

## 2013-10-28 DIAGNOSIS — R509 Fever, unspecified: Secondary | ICD-10-CM

## 2013-10-28 DIAGNOSIS — M810 Age-related osteoporosis without current pathological fracture: Secondary | ICD-10-CM | POA: Diagnosis present

## 2013-10-28 DIAGNOSIS — K589 Irritable bowel syndrome without diarrhea: Secondary | ICD-10-CM | POA: Diagnosis present

## 2013-10-28 DIAGNOSIS — K219 Gastro-esophageal reflux disease without esophagitis: Secondary | ICD-10-CM | POA: Diagnosis present

## 2013-10-28 DIAGNOSIS — C9110 Chronic lymphocytic leukemia of B-cell type not having achieved remission: Secondary | ICD-10-CM | POA: Diagnosis present

## 2013-10-28 DIAGNOSIS — E785 Hyperlipidemia, unspecified: Secondary | ICD-10-CM | POA: Diagnosis present

## 2013-10-28 DIAGNOSIS — J310 Chronic rhinitis: Secondary | ICD-10-CM | POA: Diagnosis present

## 2013-10-28 DIAGNOSIS — D630 Anemia in neoplastic disease: Secondary | ICD-10-CM | POA: Diagnosis present

## 2013-10-28 DIAGNOSIS — J111 Influenza due to unidentified influenza virus with other respiratory manifestations: Principal | ICD-10-CM | POA: Diagnosis present

## 2013-10-28 LAB — CBC WITH DIFFERENTIAL/PLATELET
Basophils Absolute: 0 10*3/uL (ref 0.0–0.1)
Eosinophils Absolute: 0 10*3/uL (ref 0.0–0.7)
Eosinophils Relative: 0 % (ref 0–5)
HCT: 28.7 % — ABNORMAL LOW (ref 36.0–46.0)
Lymphocytes Relative: 92 % — ABNORMAL HIGH (ref 12–46)
MCHC: 32.4 g/dL (ref 30.0–36.0)
MCV: 95.7 fL (ref 78.0–100.0)
Monocytes Relative: 4 % (ref 3–12)
Neutro Abs: 6.2 10*3/uL (ref 1.7–7.7)
Neutrophils Relative %: 4 % — ABNORMAL LOW (ref 43–77)
Platelets: 62 10*3/uL — ABNORMAL LOW (ref 150–400)
RDW: 17.1 % — ABNORMAL HIGH (ref 11.5–15.5)
WBC: 153.8 10*3/uL (ref 4.0–10.5)

## 2013-10-28 LAB — COMPREHENSIVE METABOLIC PANEL
ALT: 40 U/L — ABNORMAL HIGH (ref 0–35)
AST: 37 U/L (ref 0–37)
Albumin: 3.8 g/dL (ref 3.5–5.2)
CO2: 21 mEq/L (ref 19–32)
Calcium: 9.4 mg/dL (ref 8.4–10.5)
Creatinine, Ser: 1.04 mg/dL (ref 0.50–1.10)
GFR calc non Af Amer: 50 mL/min — ABNORMAL LOW (ref >90)
Sodium: 127 mEq/L — ABNORMAL LOW (ref 135–145)
Total Protein: 6.2 g/dL (ref 6.0–8.3)

## 2013-10-28 MED ORDER — VANCOMYCIN HCL IN DEXTROSE 1-5 GM/200ML-% IV SOLN
1000.0000 mg | Freq: Once | INTRAVENOUS | Status: AC
  Administered 2013-10-29: 1000 mg via INTRAVENOUS
  Filled 2013-10-28: qty 200

## 2013-10-28 MED ORDER — PIPERACILLIN-TAZOBACTAM 3.375 G IVPB 30 MIN
3.3750 g | Freq: Once | INTRAVENOUS | Status: AC
  Administered 2013-10-29: 3.375 g via INTRAVENOUS
  Filled 2013-10-28: qty 50

## 2013-10-28 MED ORDER — SODIUM CHLORIDE 0.9 % IV BOLUS (SEPSIS)
1000.0000 mL | Freq: Once | INTRAVENOUS | Status: AC
  Administered 2013-10-29: 1000 mL via INTRAVENOUS

## 2013-10-28 MED ORDER — SODIUM CHLORIDE 0.9 % IV SOLN: 1000.0000 mL | INTRAVENOUS | Status: DC

## 2013-10-28 MED ORDER — SODIUM CHLORIDE 0.9 % IV SOLN: 1000.0000 mL | Freq: Once | INTRAVENOUS | Status: DC

## 2013-10-28 NOTE — ED Notes (Signed)
Pt reportsthat at 1830 yesterday she began having generalized body aches and chills and was noted to have a fever of 102, pt reports hx of ALL and is being treated at the cancer center, pt reports she was around her daughter for 2 weeks and she was diagnosed with the flu.  Pt a&o x4 at this time.

## 2013-10-28 NOTE — ED Provider Notes (Signed)
CSN: [id number]     Arrival date & time 10/28/13  1830 History   First MD Initiated Contact with Patient 10/28/13 2245     Chief Complaint  Patient presents with  . Fever  . Generalized Body Aches   (Consider location/radiation/quality/duration/timing/severity/associated sxs/prior Treatment) HPI Comments: Patient is a 79 yo F PMHx significant for CLL w/ anemia and thrombocytopenia, HTN, HLD, Osteoporosis, GERD presenting to the ED for one and a half days of fever (TMAX 103F PO), cough, myalgias, fatigue, w/ 2-3 episodes of emesis. Patient states her daughter was diagnosed with Influenza B recently and patient had spent the last two weeks with her. Patient has tried Tamiflu at home this morning with some improvement of fever. Patient is being followed at [facility]. She is not currently on any chemotherapy or radiation therapy at this time.    Past Medical History  Diagnosis Date  . Hypertension   . Hyperlipidemia   . Irritable bowel   . Osteoporosis   . GERD (gastroesophageal reflux disease)   . Rhinitis, chronic 09/15/2011  . Thrombocytopenia 09/15/2011  . Leukemia 08/06/09    CLL  . 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   History reviewed. No pertinent past surgical history. Family History  Problem Relation Age of Onset  . Pneumonia Father    History  Substance Use Topics  . Smoking status: Never Smoker   . Smokeless tobacco: Not on file  . Alcohol Use: No   OB History   Grav Para Term Preterm Abortions TAB SAB Ect Mult Living                 Review of Systems  Constitutional: Positive for fever.  Respiratory: Positive for cough.   Gastrointestinal: Positive for nausea and vomiting.  Musculoskeletal: Positive for myalgias.  All other systems reviewed and are negative.    Allergies  Bactrim  Home Medications   Current Outpatient Rx  Name  Route  Sig  Dispense  Refill  . atorvastatin (LIPITOR) 20 MG tablet   Oral   Take 20 mg by mouth daily.            Marland Kitchen azithromycin (ZITHROMAX) 500 MG tablet   Oral   Take 500 mg by mouth daily.         . calcium citrate-vitamin D (CITRACAL+D) 315-200 MG-UNIT per tablet   Oral   Take 1 tablet by mouth daily.          . diphenhydrAMINE (BENADRYL) 25 mg capsule   Oral   Take 25 mg by mouth at bedtime as needed for allergies.         . Echinacea 400 MG CAPS   Oral   Take 1 capsule by mouth 2 (two) times daily.           Marland Kitchen ESTRACE VAGINAL 0.1 MG/GM vaginal cream   Vaginal   Place vaginally 2 (two) times a week.         . ferrous sulfate 325 (65 FE) MG tablet   Oral   Take 325 mg by mouth daily.         . fish oil-omega-3 fatty acids 1000 MG capsule   Oral   Take 2 g by mouth daily.          Marland Kitchen glucosamine-chondroitin 500-400 MG tablet   Oral   Take 1 tablet by mouth daily.           Marland Kitchen loratadine (CLARITIN) 10 MG tablet   Oral  Take 10 mg by mouth daily.           . metoprolol succinate (TOPROL-XL) 25 MG 24 hr tablet   Oral   Take 12.5 mg by mouth daily.          . montelukast (SINGULAIR) 10 MG tablet   Oral   Take 10 mg by mouth daily.          . Multiple Vitamin (MULTIVITAMIN) tablet   Oral   Take 1 tablet by mouth daily.           . polyvinyl alcohol (ARTIFICIAL TEARS) 1.4 % ophthalmic solution   Both Eyes   Place 1 drop into both eyes as needed for dry eyes.         . sodium chloride (OCEAN) 0.65 % SOLN nasal spray   Each Nare   Place 1 spray into both nostrils as needed for congestion.         Marland Kitchen TAMIFLU 75 MG capsule   Oral   Take 75 mg by mouth 2 (two) times daily.         Marland Kitchen tiZANidine (ZANAFLEX) 4 MG tablet   Oral   Take 4 mg by mouth 3 (three) times a week.         . triamterene-hydrochlorothiazide (MAXZIDE-25) 37.5-25 MG per tablet   Oral   Take 1 tablet by mouth daily.           Marland Kitchen zolpidem (AMBIEN) 5 MG tablet   Oral   Take 5 mg by mouth at bedtime as needed for sleep.           BP 123/41  Pulse 84   Temp(Src) 98.3 F (36.8 C) (Oral)  Resp 18  Ht 5' (1.524 m)  Wt 112 lb (50.803 kg)  BMI 21.87 kg/m2  SpO2 97% Physical Exam  Constitutional: She appears well-developed and well-nourished. She appears ill.  HENT:  Head: Normocephalic and atraumatic.  Right Ear: External ear normal.  Left Ear: External ear normal.  Nose: Nose normal.  Mouth/Throat: Uvula is midline and oropharynx is clear and moist. Mucous membranes are dry.  Eyes: Conjunctivae, EOM and lids are normal. Pupils are equal, round, and reactive to light.  Neck: Normal range of motion. Neck supple. No spinous process tenderness and no muscular tenderness present.  Cardiovascular: Regular rhythm and normal heart sounds.  Tachycardia present.   Pulmonary/Chest: Effort normal and breath sounds normal. No respiratory distress.  Abdominal: Soft. There is splenomegaly. There is no tenderness. There is no rigidity, no rebound and no guarding.  Lymphadenopathy:    She has cervical adenopathy.  Neurological: She is alert. GCS eye subscore is 4. GCS verbal subscore is 5. GCS motor subscore is 6.    ED Course  Procedures (including critical care time) Medications  0.9 %  sodium chloride infusion (not administered)    Followed by  0.9 %  sodium chloride infusion (not administered)  piperacillin-tazobactam (ZOSYN) IVPB 3.375 g (3.375 g Intravenous New Bag/Given 10/29/13 0034)  vancomycin (VANCOCIN) IVPB 1000 mg/200 mL premix (not administered)  sodium chloride 0.9 % bolus 1,000 mL (1,000 mLs Intravenous New Bag/Given 10/29/13 0015)    Labs Review Labs Reviewed  CBC WITH DIFFERENTIAL - Abnormal; Notable for the following:    WBC 153.8 (*)    RBC 3.00 (*)    Hemoglobin 9.3 (*)    HCT 28.7 (*)    RDW 17.1 (*)    Platelets 62 (*)    Neutrophils Relative %  4 (*)    Lymphocytes Relative 92 (*)    Lymphs Abs 141.4 (*)    Monocytes Absolute 6.2 (*)    All other components within normal limits  COMPREHENSIVE METABOLIC PANEL -  Abnormal; Notable for the following:    Sodium 127 (*)    Chloride 94 (*)    Glucose, Bld 123 (*)    ALT 40 (*)    Total Bilirubin 1.3 (*)    GFR calc non Af Amer 50 (*)    GFR calc Af Amer 58 (*)    All other components within normal limits  URINE CULTURE  CULTURE, BLOOD (ROUTINE X 2)  CULTURE, BLOOD (ROUTINE X 2)  RESPIRATORY VIRUS PANEL  URINALYSIS, ROUTINE W REFLEX MICROSCOPIC  PATHOLOGIST SMEAR REVIEW  POCT LACTIC ACID (LACTATE)   Imaging Review Dg Chest 2 View  10/28/2013   CLINICAL DATA:  Body 8.  Fever.  Chills.  Cough.  EXAM: CHEST  2 VIEW  COMPARISON:  Chest CT 08/01/2013.  FINDINGS: Cardiopericardial silhouette within normal limits. Mediastinal contours normal. Trachea midline. No airspace disease or effusion. Aortic arch atherosclerosis with calcification.  IMPRESSION: No active cardiopulmonary disease.   Electronically Signed   By: Andreas Newport M.D.   On: 10/28/2013 20:03    EKG Interpretation   None       MDM   1. Fever   2. CLL (chronic lymphocytic leukemia)     12:31 AM Dr. Timothy Lasso will see and admit patient.    CLL patient presenting with at home reported temperature of 102-103F PO with positive exposure to Influenza B from daughter. Patient appears to be fatigued. No adventitious sounds appreciated on pulmonary examination. Patient initially tachycardic, but improved with NS bolus. CXR w/o evidence of pneumonia. Will empirically cover with Vanc and Zosyn for fever of unknown origin, but likely influenza related. Influenza swab sent. Patient d/w with Dr. Dierdre Highman, agrees with plan.    Jeannetta Ellis, PA-C 10/29/13 0116

## 2013-10-29 DIAGNOSIS — J111 Influenza due to unidentified influenza virus with other respiratory manifestations: Secondary | ICD-10-CM | POA: Diagnosis present

## 2013-10-29 LAB — URINALYSIS, ROUTINE W REFLEX MICROSCOPIC
Bilirubin Urine: NEGATIVE
Ketones, ur: NEGATIVE mg/dL
Specific Gravity, Urine: 1.012 (ref 1.005–1.030)
Urobilinogen, UA: 0.2 mg/dL (ref 0.0–1.0)
pH: 6 (ref 5.0–8.0)

## 2013-10-29 LAB — URINE MICROSCOPIC-ADD ON

## 2013-10-29 MED ORDER — SODIUM CHLORIDE 0.9 % IV SOLN
INTRAVENOUS | Status: DC
  Administered 2013-10-29 (×2): via INTRAVENOUS

## 2013-10-29 MED ORDER — POLYVINYL ALCOHOL 1.4 % OP SOLN
1.0000 [drp] | OPHTHALMIC | Status: DC | PRN
  Filled 2013-10-29: qty 15

## 2013-10-29 MED ORDER — OSELTAMIVIR PHOSPHATE 75 MG PO CAPS
75.0000 mg | ORAL_CAPSULE | Freq: Two times a day (BID) | ORAL | Status: DC
  Administered 2013-10-29 – 2013-10-30 (×4): 75 mg via ORAL
  Filled 2013-10-29 (×5): qty 1

## 2013-10-29 MED ORDER — ACETAMINOPHEN 650 MG RE SUPP: 650.0000 mg | Freq: Four times a day (QID) | RECTAL | Status: DC | PRN

## 2013-10-29 MED ORDER — METOPROLOL SUCCINATE 12.5 MG HALF TABLET
12.5000 mg | ORAL_TABLET | Freq: Every day | ORAL | Status: DC
  Administered 2013-10-29 – 2013-10-30 (×2): 12.5 mg via ORAL
  Filled 2013-10-29 (×2): qty 1

## 2013-10-29 MED ORDER — ATORVASTATIN CALCIUM 20 MG PO TABS
20.0000 mg | ORAL_TABLET | Freq: Every day | ORAL | Status: DC
  Administered 2013-10-29: 20 mg via ORAL
  Filled 2013-10-29 (×2): qty 1

## 2013-10-29 MED ORDER — SALINE SPRAY 0.65 % NA SOLN
1.0000 | NASAL | Status: DC | PRN
  Filled 2013-10-29: qty 44

## 2013-10-29 MED ORDER — ZOLPIDEM TARTRATE 5 MG PO TABS
5.0000 mg | ORAL_TABLET | Freq: Every evening | ORAL | Status: DC | PRN
  Administered 2013-10-29: 5 mg via ORAL
  Filled 2013-10-29: qty 1

## 2013-10-29 MED ORDER — ADULT MULTIVITAMIN W/MINERALS CH
1.0000 | ORAL_TABLET | Freq: Every day | ORAL | Status: DC
  Administered 2013-10-29 – 2013-10-30 (×2): 1 via ORAL
  Filled 2013-10-29 (×2): qty 1

## 2013-10-29 MED ORDER — VANCOMYCIN HCL IN DEXTROSE 750-5 MG/150ML-% IV SOLN
750.0000 mg | INTRAVENOUS | Status: DC
  Administered 2013-10-29: 750 mg via INTRAVENOUS
  Filled 2013-10-29 (×2): qty 150

## 2013-10-29 MED ORDER — FERROUS SULFATE 325 (65 FE) MG PO TABS
325.0000 mg | ORAL_TABLET | Freq: Every day | ORAL | Status: DC
  Administered 2013-10-29 – 2013-10-30 (×2): 325 mg via ORAL
  Filled 2013-10-29 (×2): qty 1

## 2013-10-29 MED ORDER — ONDANSETRON HCL 4 MG PO TABS: 4.0000 mg | ORAL_TABLET | Freq: Four times a day (QID) | ORAL | Status: DC | PRN

## 2013-10-29 MED ORDER — ENOXAPARIN SODIUM 40 MG/0.4ML ~~LOC~~ SOLN
40.0000 mg | SUBCUTANEOUS | Status: DC
  Administered 2013-10-29 – 2013-10-30 (×2): 40 mg via SUBCUTANEOUS
  Filled 2013-10-29 (×2): qty 0.4

## 2013-10-29 MED ORDER — PIPERACILLIN-TAZOBACTAM 3.375 G IVPB
3.3750 g | Freq: Three times a day (TID) | INTRAVENOUS | Status: DC
  Administered 2013-10-29 – 2013-10-30 (×5): 3.375 g via INTRAVENOUS
  Filled 2013-10-29 (×6): qty 50

## 2013-10-29 MED ORDER — MONTELUKAST SODIUM 10 MG PO TABS
10.0000 mg | ORAL_TABLET | Freq: Every day | ORAL | Status: DC
  Administered 2013-10-29 – 2013-10-30 (×2): 10 mg via ORAL
  Filled 2013-10-29 (×2): qty 1

## 2013-10-29 MED ORDER — LORATADINE 10 MG PO TABS
10.0000 mg | ORAL_TABLET | Freq: Every day | ORAL | Status: DC
  Administered 2013-10-29 – 2013-10-30 (×2): 10 mg via ORAL
  Filled 2013-10-29 (×2): qty 1

## 2013-10-29 MED ORDER — ONDANSETRON HCL 4 MG/2ML IJ SOLN: 4.0000 mg | Freq: Four times a day (QID) | INTRAMUSCULAR | Status: DC | PRN

## 2013-10-29 MED ORDER — SACCHAROMYCES BOULARDII 250 MG PO CAPS
250.0000 mg | ORAL_CAPSULE | Freq: Two times a day (BID) | ORAL | Status: DC
  Administered 2013-10-29 – 2013-10-30 (×3): 250 mg via ORAL
  Filled 2013-10-29 (×4): qty 1

## 2013-10-29 MED ORDER — DIPHENHYDRAMINE HCL 25 MG PO CAPS: 25.0000 mg | ORAL_CAPSULE | Freq: Every evening | ORAL | Status: DC | PRN

## 2013-10-29 MED ORDER — ACETAMINOPHEN 325 MG PO TABS
650.0000 mg | ORAL_TABLET | Freq: Four times a day (QID) | ORAL | Status: DC | PRN
  Administered 2013-10-29: 650 mg via ORAL
  Filled 2013-10-29 (×2): qty 2

## 2013-10-29 NOTE — H&P (Signed)
Angelica Sullivan is an 79 y.o. female.   Chief Complaint: fever and cough HPI: 79 yo female with CLL.   She started getting sick 24 hrs ago.  She developed fatigue, chills and fever to 102 and some cough.  Cough is not productive. Has a lot of drainage. Did get some tamiflu and took one dose today and one dose of azithromycin taken today as well. But, she felt worse and presented to the ER.  She had high temp in ER and is felt to require admission.3  Past Medical History  Diagnosis Date  . Hypertension   . Hyperlipidemia   . Irritable bowel   . Osteoporosis   . GERD (gastroesophageal reflux disease)   . Rhinitis, chronic 09/15/2011  . Thrombocytopenia 09/15/2011  . Leukemia 08/06/09    CLL  . 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 No pertinent past surgical history.  Family History  Problem Relation Age of Onset  . Pneumonia Father    Social History:  reports that she has never smoked. She does not have any smokeless tobacco history on file. She reports that she does not drink alcohol. Her drug history is not on file.  Allergies:  Allergies  Allergen Reactions  . Bactrim [Sulfamethoxazole W/Trimethoprim (Co-Trimoxazole)] Diarrhea and Other (See Comments)    Abdominal cramping     (Not in a hospital admission)  Results for orders placed during the hospital encounter of 10/28/13 (from the past 48 hour(s))  CBC WITH DIFFERENTIAL     Status: Abnormal   Collection Time    10/28/13  8:40 PM      Result Value Range   WBC 153.8 (*) 4.0 - 10.5 K/uL   Comment: REPEATED TO VERIFY     CRITICAL RESULT CALLED TO,  READ BACK BY AND VERIFIED WITH:     BEANE,J/ED @2143  ON 10/28/13 BY KARCZEWSKI,S.   RBC 3.00 (*) 3.87 - 5.11 MIL/uL   Hemoglobin 9.3 (*) 12.0 - 15.0 g/dL   HCT 78.2 (*) 95.6 - 21.3 %   MCV 95.7  78.0 - 100.0 fL   MCH 31.0  26.0 - 34.0 pg   MCHC 32.4  30.0 - 36.0 g/dL   RDW 08.6 (*) 57.8 - 46.9 %   Platelets 62 (*) 150 - 400 K/uL   Comment: REPEATED TO VERIFY     SPECIMEN CHECKED FOR CLOTS     PLATELET COUNT CONFIRMED BY SMEAR   Neutrophils Relative % 4 (*) 43 - 77 %   Lymphocytes Relative 92 (*) 12 - 46 %   Monocytes Relative 4  3 - 12 %   Eosinophils Relative 0  0 - 5 %   Basophils Relative 0  0 - 1 %   Neutro Abs 6.2  1.7 - 7.7 K/uL   Lymphs Abs 141.4 (*) 0.7 - 4.0 K/uL   Monocytes Absolute 6.2 (*) 0.1 - 1.0 K/uL   Eosinophils Absolute 0.0  0.0 - 0.7 K/uL   Basophils Absolute 0.0  0.0 - 0.1 K/uL   RBC Morphology OVALOCYTES     WBC Morphology ABSOLUTE LYMPHOCYTOSIS     Comment:  ATYPICAL LYMPHOCYTES   Smear Review PLATELET COUNT CONFIRMED BY SMEAR    COMPREHENSIVE METABOLIC PANEL     Status: Abnormal   Collection Time    10/28/13  8:40 PM      Result Value Range   Sodium 127 (*) 135 - 145 mEq/L   Potassium 4.8  3.5 - 5.1 mEq/L   Chloride 94 (*) 96 - 112 mEq/L   CO2 21  19 - 32 mEq/L   Glucose, Bld 123 (*) 70 - 99 mg/dL   BUN 21  6 - 23 mg/dL   Creatinine, Ser 0.45  0.50 - 1.10 mg/dL   Calcium 9.4  8.4 - 40.9 mg/dL   Total Protein 6.2  6.0 - 8.3 g/dL   Albumin 3.8  3.5 - 5.2 g/dL   AST 37  0 - 37 U/L   ALT 40 (*) 0 - 35 U/L   Alkaline Phosphatase 110  39 - 117 U/L   Total Bilirubin 1.3 (*) 0.3 - 1.2 mg/dL   GFR calc non Af Amer 50 (*) >90 mL/min   GFR calc Af Amer 58 (*) >90 mL/min   Comment: (NOTE)     The eGFR has been calculated using the CKD EPI equation.     This calculation has not been validated in all clinical situations.     eGFR's persistently <90 mL/min signify possible Chronic Kidney     Disease.   Dg Chest 2 View  10/28/2013   CLINICAL DATA:  Body 8.   Fever.  Chills.  Cough.  EXAM: CHEST  2 VIEW  COMPARISON:  Chest CT 08/01/2013.  FINDINGS: Cardiopericardial silhouette within normal limits. Mediastinal contours normal. Trachea midline. No airspace disease or effusion. Aortic arch atherosclerosis with calcification.  IMPRESSION: No active cardiopulmonary disease.   Electronically Signed   By: Andreas Newport M.D.   On: 10/28/2013 20:03    ROS:she had two episodes of vomiting today. Has not eaten much.  No diarrhea.  No blood noted. Mild sob or chest tightness noted now.  Blood pressure 123/41, pulse 84, temperature 98.3 F (36.8 C), temperature source Oral, resp. rate 18, height 5' (1.524 m), weight 50.803 kg (112 lb), SpO2 97.00%.  younger appearing than stated age, nad.  a few rhonchi on exam but no wheeze, rales  and she has good air movement.   rrr no m/r/g.  abd soft, nt, nd. no mass or hsm.  no edema. neuro intact. no pallor.  Assessment/Plan Fever and cough in immunosuppressed patient.  Her wbc was in the 130,000 range a few months ago and she is transitioning to a new heme/onc doctor. She most likely has influenza.  We will cover with zosyn and tamiflu and iv fluids and treat symptomatically.  Full code status. Given very high wbc i will most likely call onco for consult tomorrow.    Ezequiel Kayser, MD 10/29/2013, 12:54 AM

## 2013-10-29 NOTE — Progress Notes (Signed)
Notified by lab that pt's anerobic blood culture is positive for gram neg rods. Rafal Archuleta, [employer]

## 2013-10-29 NOTE — Progress Notes (Signed)
ANTIBIOTIC CONSULT NOTE - INITIAL  Pharmacy Consult for Vancomycin and Zosyn  Indication: rule out sepsis, neutropenic fever  Allergies  Allergen Reactions  . Bactrim [Sulfamethoxazole W/Trimethoprim (Co-Trimoxazole)] Diarrhea and Other (See Comments)    Abdominal cramping    Patient Measurements: Height: 5' (152.4 cm) Weight: 111 lb 15.9 oz (50.8 kg) IBW/kg (Calculated) : 45.5 Adjusted Body Weight:   Vital Signs: Temp: 98.2 F (36.8 C) (12/27 0205) Temp src: Oral (12/27 0205) BP: 126/71 mmHg (12/27 0205) Pulse Rate: 88 (12/27 0205) Intake/Output from previous day:   Intake/Output from this shift:    Labs:  Recent Labs  10/28/13 2040  WBC 153.8*  HGB 9.3*  PLT 62*  CREATININE 1.04   Estimated Creatinine Clearance: 32 ml/min (by C-G formula based on Cr of 1.04). No results found for this basename: VANCOTROUGH, Leodis Binet, VANCORANDOM, GENTTROUGH, GENTPEAK, GENTRANDOM, TOBRATROUGH, TOBRAPEAK, TOBRARND, AMIKACINPEAK, AMIKACINTROU, AMIKACIN,  in the last 72 hours   Microbiology: Recent Results (from the past 720 hour(s))  TECHNOLOGIST REVIEW     Status: None   Collection Time    10/25/13  8:30 AM      Result Value Range Status   Technologist Review Variant lymphs present,  2% prolymphs   Final    Medical History: Past Medical History  Diagnosis Date  . Hypertension   . Hyperlipidemia   . Irritable bowel   . Osteoporosis   . GERD (gastroesophageal reflux disease)   . Rhinitis, chronic 09/15/2011  . Thrombocytopenia 09/15/2011  . Leukemia 08/06/09    CLL  . 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    Medications:  Anti-infectives   Start     Dose/Rate Route Frequency Ordered Stop   10/29/13 2200  vancomycin (VANCOCIN) IVPB 750 mg/150 ml premix     750 mg 150 mL/hr over 60 Minutes Intravenous Every 24 hours 10/29/13 0311     10/29/13 0600  piperacillin-tazobactam (ZOSYN) IVPB 3.375 g     3.375 g 12.5 mL/hr over 240 Minutes Intravenous 3 times per day  10/29/13 0311     10/29/13 0115  oseltamivir (TAMIFLU) capsule 75 mg     75 mg Oral 2 times daily 10/29/13 0111 11/02/13 2159   10/29/13 0000  piperacillin-tazobactam (ZOSYN) IVPB 3.375 g     3.375 g 100 mL/hr over 30 Minutes Intravenous  Once 10/28/13 2350 10/29/13 0208   10/29/13 0000  vancomycin (VANCOCIN) IVPB 1000 mg/200 mL premix     1,000 mg 200 mL/hr over 60 Minutes Intravenous  Once 10/28/13 2350 10/29/13 0303     Assessment: Patient with r/o sepsis and neutropenic fever.  First dose of antibiotics already given   Goal of Therapy:  Vancomycin trough level 15-20 mcg/ml Zosyn based on renal function   Plan:  Measure antibiotic drug levels at steady state Follow up culture results vancomycin 750mg  iv q24hr Zosyn 3.375g IV Q8H infused over 4hrs.   Darlina Guys, Jacquenette Shone Crowford 10/29/2013,3:12 AM

## 2013-10-29 NOTE — ED Notes (Signed)
Antibiotics just started; paused temporarily to obtain 2nd set of cultures.

## 2013-10-29 NOTE — ED Provider Notes (Signed)
Medical screening examination/treatment/procedure(s) were performed by non-physician practitioner and as supervising physician I was immediately available for consultation/collaboration.   Sunnie Nielsen, MD 10/29/13 [phone]

## 2013-10-30 LAB — RESPIRATORY VIRUS PANEL
Influenza A H1: NOT DETECTED
Influenza A H3: NOT DETECTED
Influenza A: NOT DETECTED
Influenza B: NOT DETECTED
Parainfluenza 1: NOT DETECTED
Parainfluenza 2: NOT DETECTED
Respiratory Syncytial Virus A: NOT DETECTED
Rhinovirus: NOT DETECTED

## 2013-10-30 MED ORDER — BOOST PLUS PO LIQD
237.0000 mL | Freq: Two times a day (BID) | ORAL | Status: DC
  Administered 2013-10-30: 237 mL via ORAL
  Filled 2013-10-30 (×2): qty 237

## 2013-10-30 NOTE — Discharge Summary (Signed)
Physician Discharge Summary  Patient ID: Angelica Sullivan MRN: [id number] DOB/AGE: [birth year] 79 y.o.  Admit date: 10/28/2013 Discharge date: 10/30/2013  Admission Diagnoses:  Discharge Diagnoses:    Active Problems:   Influenza Patient Active Problem List   Diagnosis Date Noted  . Influenza 10/29/2013  . Anemia, unspecified 08/26/2013  .    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    Marland Kitchen Thrombocytopenia 09/15/2011  .    Marland Kitchen GERD 02/12/2010  . IRRITABLE BOWEL SYNDROME 02/12/2010  .        Discharged Condition: good  Hospital Course: Angelica Sullivan is a pleasant female with CLL. She presented with acute onset of high fever, chills.  This happened in the middle of a widespread flu outbreak in the community.  Given her CLL she was admitted and placed on broad spectrum abx as well as tamiflu. The flu swab was collected but never reported. However, her xray was clear and she improved clinically within 36 hours. She felt better. She had only a mild cough and she defervesced.  She was discharged home in stable condition on 10/30/13.  She also had a urine that appeared positive for infection. She did not have significant uti symptoms (maybe mild inc. Frequency).  The culture was not reported but the IV abx she received should have covered this well.  She is advised to call if new uit symptoms develop soon.  Consults: None  Significant Diagnostic Studies: none  Treatments: iv abx.  Discharge Exam:  Blood pressure 114/71, pulse 74, temperature 97.8 F (36.6 C), temperature source Oral, resp. rate 16, height 5' (1.524 m), weight 50.8 kg (111 lb 15.9 oz), SpO2 100.00%.  Physical Exam:nad, alert and oriented times 4. Lungs are cta bilat, no w/r/r. Heart is rrr with no m/r/g. abd is soft, nt, nd, no mass or hsm.  No c/c/e.  Disposition:discharge to home. She will call onco to try to  Move up her next visit a bit sooner than late February 2015.   Future Appointments Provider Department Dept  Phone   12/26/2013 8:30 AM [facility] [phone]   12/26/2013 9:00 AM Artis Delay, MD [facility] [phone]       Medication List    ASK your doctor about these medications       ARTIFICIAL TEARS 1.4 % ophthalmic solution  Generic drug:  polyvinyl alcohol  Place 1 drop into both eyes as needed for dry eyes.     atorvastatin 20 MG tablet  Commonly known as:  LIPITOR  Take 20 mg by mouth daily.     azithromycin 500 MG tablet  Commonly known as:  ZITHROMAX  Take 500 mg by mouth daily.  Finish this.     calcium citrate-vitamin D 315-200 MG-UNIT per tablet  Commonly known as:  CITRACAL+D  Take 1 tablet by mouth daily.     diphenhydrAMINE 25 mg capsule  Commonly known as:  BENADRYL  Take 25 mg by mouth at bedtime as needed for allergies.     Echinacea 400 MG Caps  Take 1 capsule by mouth 2 (two) times daily.     ESTRACE VAGINAL 0.1 MG/GM vaginal cream  Generic drug:  estradiol  Place vaginally 2 (two) times a week.     ferrous sulfate 325 (65 FE) MG tablet  Take 325 mg by mouth daily.     fish oil-omega-3 fatty acids 1000 MG capsule  Take 2 g by mouth daily.  glucosamine-chondroitin 500-400 MG tablet  Take 1 tablet by mouth daily.     loratadine 10 MG tablet  Commonly known as:  CLARITIN  Take 10 mg by mouth daily.     metoprolol succinate 25 MG 24 hr tablet  Commonly known as:  TOPROL-XL  Take 12.5 mg by mouth daily.     montelukast 10 MG tablet  Commonly known as:  SINGULAIR  Take 10 mg by mouth daily.     multivitamin tablet  Take 1 tablet by mouth daily.     sodium chloride 0.65 % Soln nasal spray  Commonly known as:  OCEAN  Place 1 spray into both nostrils as needed for congestion.     TAMIFLU 75 MG capsule  Generic drug:  oseltamivir  Take 75 mg by mouth 2 (two) times daily.  Finish your supply of this.     triamterene-hydrochlorothiazide 37.5-25 MG per tablet   Commonly known as:  MAXZIDE-25  Take 1 tablet by mouth daily.  Hold this until further notice from dr. Timothy Lasso.  Check bp once a day . Keep a record.  Report to dr. Timothy Lasso.     ZANAFLEX 4 MG tablet  Generic drug:  tiZANidine  Take 4 mg by mouth 3 (three) times a week.     zolpidem 5 MG tablet  Commonly known as:  AMBIEN  Take 5 mg by mouth at bedtime as needed for sleep.         SignedRodrigo Ran A 10/30/2013, 2:00 PM

## 2013-10-30 NOTE — Progress Notes (Signed)
INITIAL NUTRITION ASSESSMENT  DOCUMENTATION CODES Per approved criteria  -Non-severe (moderate) malnutrition in the context of chronic illness   INTERVENTION:  Continue MVI daily  Add Boost bid  Continue regular diet per preferences.  Encouraged intake.  Instructed on tips to increase calories and protein to help maintain weight.  Provided patient with fact sheets, "poor appetite" and " increasing calories and protein".    [facility] for a nutrition appointment per patient's request.  NUTRITION DIAGNOSIS: Nutrition related knowledge deficite related to increased calorie and protein needs as evidenced by weight loss.   Goal: Intake of meals and supplements to meet >90% of estimated needs  Monitor:  Intake, labs, weight trend  Reason for Assessment: mst  79 y.o. female  Admitting Dx: <principal problem not specified>  ASSESSMENT: Patient admitted with fever and cough in immunosuppressed patient.  WBC was in the 130,000 range a few months ago and is transitioning to a new hem/onc doctor at the [facility]  She most likely has influenza.    Patient reports poor appetite prior to admit with fair intake.  Best meal of the day around 2 pm.  Often could not eat breakfast and little dinner due to poor appetite.  Drinks Boost.  Used to drink milkshakes but does not now because of lack of desire.  Current intake good.  Nutrition Focused Physical Exam:  Subcutaneous Fat:  Orbital Region: wnl Upper Arm Region: wnl Thoracic and Lumbar Region: n/a  Muscle:  Temple Region: mild Clavicle Bone Region: wnl Clavicle and Acromion Bone Region: wnl Scapular Bone Region: moderate Dorsal Hand: wnl Patellar Region: moderate Anterior Thigh Region: mild Posterior Calf Region: mild  Edema: none noted  Patient meets criteria for mild/moderate malnutrition related to chronic illness AEB 5% weight loss in the past 2 months, intake <75% for > one month,  and decreased muscle loss.  Height: Ht Readings from Last 1 Encounters:  10/29/13 5' (1.524 m)    Weight: Wt Readings from Last 1 Encounters:  10/29/13 111 lb 15.9 oz (50.8 kg)    Ideal Body Weight: 100  % Ideal Body Weight: 111  Wt Readings from Last 10 Encounters:  10/29/13 111 lb 15.9 oz (50.8 kg)  08/24/13 117 lb 9.6 oz (53.343 kg)  04/08/13 118 lb 9.6 oz (53.797 kg)  12/15/12 121 lb 11.2 oz (55.203 kg)  10/19/12 120 lb 9.6 oz (54.704 kg)  08/27/12 123 lb 4.8 oz (55.929 kg)  08/16/12 123 lb 8 oz (56.019 kg)  06/14/12 125 lb 1.6 oz (56.745 kg)  04/12/12 126 lb (57.153 kg)  01/05/12 133 lb 1.6 oz (60.374 kg)    Usual Body Weight: 155 lbs 4 years ago prior to illness.  % Usual Body Weight: 72  BMI:  Body mass index is 21.87 kg/(m^2).  Estimated Nutritional Needs: Kcal: 1600-1800 Protein: 60-70 gm Fluid: 1.4L  Skin: intact  Diet Order: General  EDUCATION NEEDS: -Education needs addressed   Intake/Output Summary (Last 24 hours) at 10/30/13 1055 Last data filed at 10/30/13 0500  Gross per 24 hour  Intake 1189.58 ml  Output      0 ml  Net 1189.58 ml     Labs:   Recent Labs Lab 10/28/13 2040  NA 127*  K 4.8  CL 94*  CO2 21  BUN 21  CREATININE 1.04  CALCIUM 9.4  GLUCOSE 123*    CBG (last 3)  No results found for this basename: GLUCAP,  in the  last 72 hours  Scheduled Meds: . sodium chloride  1,000 mL Intravenous Once  . atorvastatin  20 mg Oral q1800  . enoxaparin (LOVENOX) injection  40 mg Subcutaneous Q24H  . ferrous sulfate  325 mg Oral Daily  . loratadine  10 mg Oral Daily  . metoprolol succinate  12.5 mg Oral Daily  . montelukast  10 mg Oral Daily  . multivitamin with minerals  1 tablet Oral Daily  . oseltamivir  75 mg Oral BID  . piperacillin-tazobactam (ZOSYN)  IV  3.375 g Intravenous Q8H  . saccharomyces boulardii  250 mg Oral BID  . vancomycin  750 mg Intravenous Q24H    Continuous Infusions: . sodium chloride 50 mL/hr at  10/29/13 2151    Past Medical History  Diagnosis Date  . Hypertension   . Hyperlipidemia   . Irritable bowel   . Osteoporosis   . GERD (gastroesophageal reflux disease)   . Rhinitis, chronic 09/15/2011  . Thrombocytopenia 09/15/2011  . Leukemia 08/06/09    CLL  . 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 No pertinent past surgical history.  Oran Rein, RD, LDN Clinical Inpatient Dietitian Pager:  [phone] Weekend and after hours pager:  [phone]

## 2013-10-30 NOTE — Progress Notes (Signed)
Discharge instructions explained to pt and husband. Pressure dressing to IV site, pt states she has prolonged bleeding.

## 2013-10-31 LAB — PATHOLOGIST SMEAR REVIEW

## 2013-10-31 LAB — CULTURE, BLOOD (ROUTINE X 2)

## 2013-10-31 LAB — URINE CULTURE: Colony Count: >=100000

## 2013-11-02 ENCOUNTER — Telehealth: Payer: Self-pay | Admitting: Nutrition

## 2013-11-02 NOTE — Telephone Encounter (Signed)
Patient requested nutrition appointment.  When I contacted patient, she reported she did not feel well and was getting over the flu.  Provided my contact information and patient will call me to make nutrition appointment when she feels better.

## 2013-11-04 LAB — CULTURE, BLOOD (ROUTINE X 2): Culture: NO GROWTH

## 2013-11-07 ENCOUNTER — Other Ambulatory Visit: Payer: Self-pay | Admitting: Hematology and Oncology

## 2013-11-07 ENCOUNTER — Telehealth: Payer: Self-pay | Admitting: Hematology and Oncology

## 2013-11-07 NOTE — Telephone Encounter (Signed)
s/w pt re appt for 1/6 @ 10:30am.

## 2013-11-07 NOTE — Telephone Encounter (Signed)
pt called re recent adm and white blood count being low. message to desk nurse and pt aware desk nurse will contact her.

## 2013-11-08 ENCOUNTER — Encounter: Payer: Self-pay | Admitting: Hematology and Oncology

## 2013-11-08 ENCOUNTER — Ambulatory Visit (HOSPITAL_BASED_OUTPATIENT_CLINIC_OR_DEPARTMENT_OTHER): Payer: Medicare Other | Admitting: Hematology and Oncology

## 2013-11-08 ENCOUNTER — Telehealth: Payer: Self-pay | Admitting: Hematology and Oncology

## 2013-11-08 VITALS — BP 154/50 | HR 74 | Temp 97.5°F | Resp 18 | Ht 60.0 in | Wt 113.2 lb

## 2013-11-08 DIAGNOSIS — D696 Thrombocytopenia, unspecified: Secondary | ICD-10-CM

## 2013-11-08 DIAGNOSIS — C9110 Chronic lymphocytic leukemia of B-cell type not having achieved remission: Secondary | ICD-10-CM | POA: Insufficient documentation

## 2013-11-08 DIAGNOSIS — D649 Anemia, unspecified: Secondary | ICD-10-CM

## 2013-11-08 DIAGNOSIS — J189 Pneumonia, unspecified organism: Secondary | ICD-10-CM

## 2013-11-08 NOTE — Telephone Encounter (Signed)
gv pt appt schedule for january and february. added appt w/BN for 1/8 per pt request.

## 2013-11-08 NOTE — Progress Notes (Signed)
[place] OFFICE PROGRESS NOTE  Patient Care Team: Precious Reel, MD as PCP - General (Internal Medicine) Heath Lark, MD as Consulting Physician (Hematology and Oncology)  DIAGNOSIS: Stage IV CLL with anemia, thrombocytopenia  SUMMARY OF ONCOLOGIC HISTORY: This is a pleasant 80 year old lady who was diagnosed with CLL almost 5 years ago. She has progressive leukocytosis, anemia and thrombocytopenia. The patient also was noted to have hypogammaglobulinemia with recurrent pneumonia.  INTERVAL HISTORY: Angelica Sullivan 80 y.o. female returns for further followup. She was recently admitted to the hospital for another episode of pneumonia and was just discharged home. She denies any need for blood transfusion recently. She denies any recent fever, chills, night sweats  The patient denies any recent signs or symptoms of bleeding such as spontaneous epistaxis, hematuria or hematochezia. She has lost some weight recently due to recurrent infection I have reviewed the past medical history, past surgical history, social history and family history with the patient and they are unchanged from previous note.  ALLERGIES:  is allergic to bactrim.  MEDICATIONS:  Current Outpatient Prescriptions  Medication Sig Dispense Refill  . atorvastatin (LIPITOR) 20 MG tablet Take 20 mg by mouth daily.        . calcium citrate-vitamin D (CITRACAL+D) 315-200 MG-UNIT per tablet Take 1 tablet by mouth daily.       . diphenhydrAMINE (BENADRYL) 25 mg capsule Take 25 mg by mouth at bedtime as needed for allergies.      . Echinacea 400 MG CAPS Take 1 capsule by mouth 2 (two) times daily.        Marland Kitchen ESTRACE VAGINAL 0.1 MG/GM vaginal cream       . ferrous sulfate 325 (65 FE) MG tablet Take 325 mg by mouth daily.      . fish oil-omega-3 fatty acids 1000 MG capsule Take 2 g by mouth daily.       Marland Kitchen glucosamine-chondroitin 500-400 MG tablet Take 1 tablet by mouth daily.        Marland Kitchen loratadine (CLARITIN) 10 MG  tablet Take 10 mg by mouth daily.        . metoprolol succinate (TOPROL-XL) 25 MG 24 hr tablet Take 12.5 mg by mouth daily.       . montelukast (SINGULAIR) 10 MG tablet Take 10 mg by mouth daily.       . Multiple Vitamin (MULTIVITAMIN) tablet Take 1 tablet by mouth daily.        . polyvinyl alcohol (ARTIFICIAL TEARS) 1.4 % ophthalmic solution Place 1 drop into both eyes as needed for dry eyes.      . sodium chloride (OCEAN) 0.65 % SOLN nasal spray Place 1 spray into both nostrils as needed for congestion.      Marland Kitchen tiZANidine (ZANAFLEX) 4 MG tablet Take 4 mg by mouth 3 (three) times a week.      . zolpidem (AMBIEN) 5 MG tablet Take 5 mg by mouth at bedtime as needed for sleep.       Marland Kitchen triamterene-hydrochlorothiazide (MAXZIDE-25) 37.5-25 MG per tablet        No current facility-administered medications for this visit.    REVIEW OF SYSTEMS:   Eyes: Denies blurriness of vision Ears, nose, mouth, throat, and face: Denies mucositis or sore throat Respiratory: Denies cough, dyspnea or wheezes Cardiovascular: Denies palpitation, chest discomfort or lower extremity swelling Gastrointestinal:  Denies nausea, heartburn or change in bowel habits Skin: Denies abnormal skin rashes  Neurological:Denies numbness, tingling or new weaknesses Behavioral/Psych:  Mood is stable, no new changes  All other systems were reviewed with the patient and are negative.  PHYSICAL EXAMINATION: ECOG PERFORMANCE STATUS: 2 - Symptomatic, <50% confined to bed  Filed Vitals:   11/08/13 1023  BP: 154/50  Pulse: 74  Temp: 97.5 F (36.4 C)  Resp: 18   Filed Weights   11/08/13 1023  Weight: 113 lb 3.2 oz (51.347 kg)    GENERAL:alert, no distress and comfortable SKIN: skin color, texture, turgor are normal, noted some chronic bruising and thin skin EYES: normal, Conjunctiva are pink and non-injected, sclera clear OROPHARYNX:no exudate, no erythema and lips, buccal mucosa, and tongue normal  NECK: supple, thyroid  normal size, non-tender, without nodularity LYMPH:  no palpable lymphadenopathy in the cervical, axillary or inguinal LUNGS: clear to auscultation and percussion with normal breathing effort HEART: regular rate & rhythm and no murmurs and no lower extremity edema ABDOMEN:abdomen soft, non-tender and normal bowel sounds. Palpable splenomegaly Musculoskeletal:no cyanosis of digits and no clubbing  NEURO: alert & oriented x 3 with fluent speech, no focal motor/sensory deficits  LABORATORY DATA:  I have reviewed the data as listed    Component Value Date/Time   NA 127* 10/28/2013 2040   NA 136 08/01/2013 0758   K 4.8 10/28/2013 2040   K 4.2 08/01/2013 0758   CL 94* 10/28/2013 2040   CL 99 04/08/2013 0922   CO2 21 10/28/2013 2040   CO2 24 08/01/2013 0758   GLUCOSE 123* 10/28/2013 2040   GLUCOSE 92 08/01/2013 0758   GLUCOSE 92 04/08/2013 0922   BUN 21 10/28/2013 2040   BUN 14.6 08/01/2013 0758   CREATININE 1.04 10/28/2013 2040   CREATININE 0.9 08/01/2013 0758   CALCIUM 9.4 10/28/2013 2040   CALCIUM 9.9 08/01/2013 0758   PROT 6.2 10/28/2013 2040   PROT 5.7* 08/01/2013 0758   ALBUMIN 3.8 10/28/2013 2040   ALBUMIN 3.7 08/01/2013 0758   AST 37 10/28/2013 2040   AST 23 08/01/2013 0758   ALT 40* 10/28/2013 2040   ALT 18 08/01/2013 0758   ALKPHOS 110 10/28/2013 2040   ALKPHOS 53 08/01/2013 0758   BILITOT 1.3* 10/28/2013 2040   BILITOT 0.61 08/01/2013 0758   GFRNONAA 50* 10/28/2013 2040   GFRAA 58* 10/28/2013 2040    No results found for this basename: SPEP, UPEP,  kappa and lambda light chains    Lab Results  Component Value Date   WBC 153.8* 10/28/2013   NEUTROABS 6.2 10/28/2013   HGB 9.3* 10/28/2013   HCT 28.7* 10/28/2013   MCV 95.7 10/28/2013   PLT 62* 10/28/2013      Chemistry      Component Value Date/Time   NA 127* 10/28/2013 2040   NA 136 08/01/2013 0758   K 4.8 10/28/2013 2040   K 4.2 08/01/2013 0758   CL 94* 10/28/2013 2040   CL 99 04/08/2013 0922   CO2 21 10/28/2013 2040    CO2 24 08/01/2013 0758   BUN 21 10/28/2013 2040   BUN 14.6 08/01/2013 0758   CREATININE 1.04 10/28/2013 2040   CREATININE 0.9 08/01/2013 0758      Component Value Date/Time   CALCIUM 9.4 10/28/2013 2040   CALCIUM 9.9 08/01/2013 0758   ALKPHOS 110 10/28/2013 2040   ALKPHOS 53 08/01/2013 0758   AST 37 10/28/2013 2040   AST 23 08/01/2013 0758   ALT 40* 10/28/2013 2040   ALT 18 08/01/2013 0758   BILITOT 1.3* 10/28/2013 2040   BILITOT 0.61 08/01/2013 0758  ASSESSMENT & PLAN:  #1 CLL She has progression of disease over the years. The most important complication that I am concerned about his recurrent infection. I recommend another evaluation in one month with history, physical examination and blood work. The patient is aware she may need treatment soon. If she is sufficiently recover from her recent pneumonia next month, I will go ahead and restage her with CT scan and bone marrow biopsy and to consider treatment soon #2 anemia This is likely anemia of chronic disease. The patient denies recent history of bleeding such as epistaxis, hematuria or hematochezia. She is asymptomatic from the anemia. We will observe for now.  She does not require transfusion now.  I recommended she discontinue iron supplement #3 thrombocytopenia The cause is due to splenomegaly and CLL. It is mild and there is little change compared from previous platelet count. The patient denies recent history of bleeding such as epistaxis, hematuria or hematochezia. She is asymptomatic from the thrombocytopenia. I will observe for now.  she does not require transfusion now.  #4 recent weight loss This is due to recent infection. I recommend she increase oral intake #5 history of recurrent pneumonia This could be related to hypogammaglobulinemia. I felt that this is an indication to start treatment for CLL in the very near future #6 hyponatremia This is related to recent pneumonia. Recommend observation only   Orders Placed  This Encounter  Procedures  . CBC & Diff and Retic    Standing Status: Future     Number of Occurrences:      Standing Expiration Date: 11/08/2014  . Comprehensive metabolic panel    Standing Status: Future     Number of Occurrences:      Standing Expiration Date: 11/08/2014  . Lactate dehydrogenase    Standing Status: Future     Number of Occurrences:      Standing Expiration Date: 11/08/2014  . IgG, IgA, IgM    Standing Status: Future     Number of Occurrences:      Standing Expiration Date: 11/08/2014  . FISH, Peripheral Blood    CLL FISH    Standing Status: Future     Number of Occurrences:      Standing Expiration Date: 11/08/2014  . Hold Tube, Blood Bank    Standing Status: Future     Number of Occurrences:      Standing Expiration Date: 11/08/2014  . Direct antiglobulin test    Standing Status: Future     Number of Occurrences:      Standing Expiration Date: 11/08/2014   All questions were answered. The patient knows to call the clinic with any problems, questions or concerns. No barriers to learning was detected. I spent 25 minutes counseling the patient face to face. The total time spent in the appointment was 40 minutes and more than 50% was on counseling and review of test results     [facility], [place], MD 11/08/2013 3:46 PM

## 2013-11-08 NOTE — Progress Notes (Signed)
Called insurance and she is active effective 11/03/2005.

## 2013-11-10 ENCOUNTER — Ambulatory Visit: Payer: Medicare Other | Admitting: Nutrition

## 2013-11-10 NOTE — Progress Notes (Signed)
This patient is a 80 year old female diagnosed with stage IV CLL.  She is a patient of Dr. Alvy Bimler.  Past medical history includes hypertension, hyperlipidemia, IBS, osteoporosis, and GERD.  Medications include Lipitor, ferrous sulfate, omega-3 fatty acids, and multivitamin.  Labs include sodium 127, and glucose 123 on December 26.  Height: 60 inches. Weight: 113.2 pounds January 6. Usual body weight: 121.7 pounds February 2014. BMI: 22.11.  Estimated nutrition needs: 1550 - 1750 calories, 70-80 g protein, 1.7 L fluid.  Patient reports very poor appetite and limited oral intake.  She does not like ensure or milk.  She will drink a milkshake.  She enjoys the occasional Strawberry boost.  Patient also complains of fatigue. She has lost 7% of her previous weight documented in February of 2014.  Nutrition diagnosis: Inadequate oral intake related to poor appetite as evidenced by 9 pound weight loss from usual body weight.  Intervention: Patient was educated to consume small amounts of food and beverages every 2 hours during the day and evening.  I've encouraged patient to add calories to the food she currently chooses.  I have educated her on a variety of oral nutrition supplements to improve her caloric intake.  I've given her samples of boost and clear liquid juices.  I've also given her Enu which has 490 calories and 25 g of protein per serving.  Patient was educated on importance of increasing overall calories and protein to maintain lean body mass.  Questions were answered.  Teach back method used.  Monitoring, evaluation, goals: Patient will work to increase oral nutrition supplements along with small, frequent meals and snacks to minimize further weight loss.  Next visit: Patient will call me if she has questions or concerns.

## 2013-12-06 ENCOUNTER — Other Ambulatory Visit (HOSPITAL_COMMUNITY)
Admission: RE | Admit: 2013-12-06 | Discharge: 2013-12-06 | Disposition: A | Discharge status: Home / Self Care | Payer: Medicare Other | Source: Ambulatory Visit | Attending: Hematology and Oncology | Admitting: Hematology and Oncology

## 2013-12-06 ENCOUNTER — Other Ambulatory Visit: Payer: Self-pay | Admitting: Hematology and Oncology

## 2013-12-06 ENCOUNTER — Other Ambulatory Visit (HOSPITAL_BASED_OUTPATIENT_CLINIC_OR_DEPARTMENT_OTHER): Payer: Medicare Other

## 2013-12-06 ENCOUNTER — Encounter: Payer: Self-pay | Admitting: *Deleted

## 2013-12-06 ENCOUNTER — Ambulatory Visit (HOSPITAL_BASED_OUTPATIENT_CLINIC_OR_DEPARTMENT_OTHER): Payer: Medicare Other | Admitting: Hematology and Oncology

## 2013-12-06 ENCOUNTER — Telehealth: Payer: Self-pay | Admitting: *Deleted

## 2013-12-06 ENCOUNTER — Encounter: Payer: Self-pay | Admitting: Hematology and Oncology

## 2013-12-06 VITALS — BP 129/44 | HR 69 | Temp 97.0°F | Resp 18 | Ht 60.0 in | Wt 114.0 lb

## 2013-12-06 DIAGNOSIS — C9110 Chronic lymphocytic leukemia of B-cell type not having achieved remission: Secondary | ICD-10-CM | POA: Insufficient documentation

## 2013-12-06 DIAGNOSIS — D696 Thrombocytopenia, unspecified: Secondary | ICD-10-CM

## 2013-12-06 DIAGNOSIS — D801 Nonfamilial hypogammaglobulinemia: Secondary | ICD-10-CM

## 2013-12-06 DIAGNOSIS — D649 Anemia, unspecified: Secondary | ICD-10-CM

## 2013-12-06 LAB — CBC & DIFF AND RETIC
BASO%: 0.2 % (ref 0.0–2.0)
Basophils Absolute: 0.3 10*3/uL — ABNORMAL HIGH (ref 0.0–0.1)
EOS%: 0.4 % (ref 0.0–7.0)
Eosinophils Absolute: 0.8 10*3/uL — ABNORMAL HIGH (ref 0.0–0.5)
HEMATOCRIT: 27.4 % — AB (ref 34.8–46.6)
HGB: 9 g/dL — ABNORMAL LOW (ref 11.6–15.9)
Immature Retic Fract: 18.3 % — ABNORMAL HIGH (ref 1.60–10.00)
LYMPH#: 165.9 10*3/uL — AB (ref 0.9–3.3)
LYMPH%: 95.5 % — ABNORMAL HIGH (ref 14.0–49.7)
MCH: 33 pg (ref 25.1–34.0)
MCHC: 33 g/dL (ref 31.5–36.0)
MCV: 100 fL (ref 79.5–101.0)
MONO#: 4.6 10*3/uL — ABNORMAL HIGH (ref 0.1–0.9)
MONO%: 2.7 % (ref 0.0–14.0)
NEUT#: 2.1 10*3/uL (ref 1.5–6.5)
NEUT%: 1.2 % — AB (ref 38.4–76.8)
Platelets: 46 10*3/uL — ABNORMAL LOW (ref 145–400)
RBC: 2.73 10*6/uL — ABNORMAL LOW (ref 3.70–5.45)
RDW: 18.1 % — AB (ref 11.2–14.5)
RETIC CT ABS: 24.84 10*3/uL — AB (ref 33.70–90.70)
Retic %: 0.91 % (ref 0.70–2.10)
WBC: 173.7 10*3/uL (ref 3.9–10.3)
nRBC: 0 % (ref 0–0)

## 2013-12-06 LAB — COMPREHENSIVE METABOLIC PANEL (CC13)
ALBUMIN: 4.3 g/dL (ref 3.5–5.0)
ALT: 19 U/L (ref 0–55)
ANION GAP: 8 meq/L (ref 3–11)
AST: 22 U/L (ref 5–34)
Alkaline Phosphatase: 54 U/L (ref 40–150)
BUN: 21 mg/dL (ref 7.0–26.0)
CALCIUM: 10.5 mg/dL — AB (ref 8.4–10.4)
CHLORIDE: 111 meq/L — AB (ref 98–109)
CO2: 25 meq/L (ref 22–29)
CREATININE: 1.1 mg/dL (ref 0.6–1.1)
GLUCOSE: 100 mg/dL (ref 70–140)
Potassium: 5 mEq/L (ref 3.5–5.1)
Sodium: 144 mEq/L (ref 136–145)
Total Bilirubin: 0.56 mg/dL (ref 0.20–1.20)
Total Protein: 6 g/dL — ABNORMAL LOW (ref 6.4–8.3)

## 2013-12-06 LAB — TECHNOLOGIST REVIEW

## 2013-12-06 LAB — URIC ACID (CC13): Uric Acid, Serum: 7.7 mg/dl — ABNORMAL HIGH (ref 2.6–7.4)

## 2013-12-06 LAB — HEPATITIS B SURFACE ANTIGEN: Hepatitis B Surface Ag: NEGATIVE

## 2013-12-06 LAB — LACTATE DEHYDROGENASE (CC13): LDH: 174 U/L (ref 125–245)

## 2013-12-06 LAB — HEPATITIS B SURFACE ANTIBODY,QUALITATIVE

## 2013-12-06 LAB — HOLD TUBE, BLOOD BANK

## 2013-12-06 LAB — HEPATITIS B CORE ANTIBODY, IGM: Hep B C IgM: NONREACTIVE

## 2013-12-06 NOTE — Progress Notes (Signed)
[place] OFFICE PROGRESS NOTE  Patient Care Team: Precious Reel, MD as PCP - General (Internal Medicine) Heath Lark, MD as Consulting Physician (Hematology and Oncology)  DIAGNOSIS: Stage IV CLL with progressive leukocytosis and anemia and thrombocytopenia  SUMMARY OF ONCOLOGIC HISTORY: This is a pleasant 80 year old lady who was diagnosed with CLL almost 5 years ago. She has progressive leukocytosis, anemia and thrombocytopenia. The patient also was noted to have hypogammaglobulinemia with recurrent pneumonia.  INTERVAL HISTORY: Angelica Sullivan 80 y.o. female returns for further followup. The patient just recovered from the pneumonia. She still has nasal congestion. Recently she has urinary tract infection, treated with ciprofloxacin. She is able to maintain her weight. Denies any new lymphadenopathy.  I have reviewed the past medical history, past surgical history, social history and family history with the patient and they are unchanged from previous note.  ALLERGIES:  is allergic to bactrim.  MEDICATIONS:  Current Outpatient Prescriptions  Medication Sig Dispense Refill  . atorvastatin (LIPITOR) 20 MG tablet Take 20 mg by mouth daily.        . calcium citrate-vitamin D (CITRACAL+D) 315-200 MG-UNIT per tablet Take 1 tablet by mouth daily.       . diphenhydrAMINE (BENADRYL) 25 mg capsule Take 25 mg by mouth at bedtime as needed for allergies.      . Echinacea 400 MG CAPS Take 1 capsule by mouth 2 (two) times daily.        Marland Kitchen ESTRACE VAGINAL 0.1 MG/GM vaginal cream       . fish oil-omega-3 fatty acids 1000 MG capsule Take 2 g by mouth daily.       Marland Kitchen glucosamine-chondroitin 500-400 MG tablet Take 1 tablet by mouth daily.        Marland Kitchen loratadine (CLARITIN) 10 MG tablet Take 10 mg by mouth daily.        . metoprolol succinate (TOPROL-XL) 25 MG 24 hr tablet Take 12.5 mg by mouth daily.       . montelukast (SINGULAIR) 10 MG tablet Take 10 mg by mouth daily.       . Multiple  Vitamin (MULTIVITAMIN) tablet Take 1 tablet by mouth daily.        . polyvinyl alcohol (ARTIFICIAL TEARS) 1.4 % ophthalmic solution Place 1 drop into both eyes as needed for dry eyes.      . sodium chloride (OCEAN) 0.65 % SOLN nasal spray Place 1 spray into both nostrils as needed for congestion.      Marland Kitchen tiZANidine (ZANAFLEX) 4 MG tablet Take 4 mg by mouth 3 (three) times a week.      . zolpidem (AMBIEN) 5 MG tablet Take 5 mg by mouth at bedtime as needed for sleep.        No current facility-administered medications for this visit.    REVIEW OF SYSTEMS:   Constitutional: Denies fevers, chills or abnormal weight loss Eyes: Denies blurriness of vision Ears, nose, mouth, throat, and face: Denies mucositis or sore throat Respiratory: Denies cough, dyspnea or wheezes Cardiovascular: Denies palpitation, chest discomfort or lower extremity swelling Gastrointestinal:  Denies nausea, heartburn or change in bowel habits Skin: Denies abnormal skin rashes Lymphatics: Denies new lymphadenopathy or easy bruising Neurological:Denies numbness, tingling or new weaknesses Behavioral/Psych: Mood is stable, no new changes  All other systems were reviewed with the patient and are negative.  PHYSICAL EXAMINATION: ECOG PERFORMANCE STATUS: 1 - Symptomatic but completely ambulatory  Filed Vitals:   12/06/13 1015  BP: 129/44  Pulse:  69  Temp: 97 F (36.1 C)  Resp: 18   Filed Weights   12/06/13 1015  Weight: 114 lb (51.71 kg)    GENERAL:alert, no distress and comfortable SKIN: skin color, texture, turgor are normal, no rashes or significant lesions EYES: normal, Conjunctiva are pink and non-injected, sclera clear OROPHARYNX:no exudate, no erythema and lips, buccal mucosa, and tongue normal  NECK: supple, thyroid normal size, non-tender, without nodularity LYMPH:  no palpable lymphadenopathy in the cervical, axillary or inguinal LUNGS: clear to auscultation and percussion with normal breathing  effort HEART: regular rate & rhythm and no murmurs and no lower extremity edema ABDOMEN:abdomen soft, non-tender and normal bowel sounds. Palpable splenomegaly Musculoskeletal:no cyanosis of digits and no clubbing  NEURO: alert & oriented x 3 with fluent speech, no focal motor/sensory deficits  LABORATORY DATA:  I have reviewed the data as listed    Component Value Date/Time   NA 144 12/06/2013 0912   NA 127* 10/28/2013 2040   K 5.0 12/06/2013 0912   K 4.8 10/28/2013 2040   CL 94* 10/28/2013 2040   CL 99 04/08/2013 0922   CO2 25 12/06/2013 0912   CO2 21 10/28/2013 2040   GLUCOSE 100 12/06/2013 0912   GLUCOSE 123* 10/28/2013 2040   GLUCOSE 92 04/08/2013 0922   BUN 21.0 12/06/2013 0912   BUN 21 10/28/2013 2040   CREATININE 1.1 12/06/2013 0912   CREATININE 1.04 10/28/2013 2040   CALCIUM 10.5* 12/06/2013 0912   CALCIUM 9.4 10/28/2013 2040   PROT 6.0* 12/06/2013 0912   PROT 6.2 10/28/2013 2040   ALBUMIN 4.3 12/06/2013 0912   ALBUMIN 3.8 10/28/2013 2040   AST 22 12/06/2013 0912   AST 37 10/28/2013 2040   ALT 19 12/06/2013 0912   ALT 40* 10/28/2013 2040   ALKPHOS 54 12/06/2013 0912   ALKPHOS 110 10/28/2013 2040   BILITOT 0.56 12/06/2013 0912   BILITOT 1.3* 10/28/2013 2040   GFRNONAA 50* 10/28/2013 2040   GFRAA 58* 10/28/2013 2040    No results found for this basename: SPEP,  UPEP,   kappa and lambda light chains    Lab Results  Component Value Date   WBC 173.7* 12/06/2013   NEUTROABS 2.1 12/06/2013   HGB 9.0* 12/06/2013   HCT 27.4* 12/06/2013   MCV 100.0 12/06/2013   PLT 46* 12/06/2013      Chemistry      Component Value Date/Time   NA 144 12/06/2013 0912   NA 127* 10/28/2013 2040   K 5.0 12/06/2013 0912   K 4.8 10/28/2013 2040   CL 94* 10/28/2013 2040   CL 99 04/08/2013 0922   CO2 25 12/06/2013 0912   CO2 21 10/28/2013 2040   BUN 21.0 12/06/2013 0912   BUN 21 10/28/2013 2040   CREATININE 1.1 12/06/2013 0912   CREATININE 1.04 10/28/2013 2040      Component Value Date/Time   CALCIUM 10.5* 12/06/2013 0912    CALCIUM 9.4 10/28/2013 2040   ALKPHOS 54 12/06/2013 0912   ALKPHOS 110 10/28/2013 2040   AST 22 12/06/2013 0912   AST 37 10/28/2013 2040   ALT 19 12/06/2013 0912   ALT 40* 10/28/2013 2040   BILITOT 0.56 12/06/2013 0912   BILITOT 1.3* 10/28/2013 2040     ASSESSMENT & PLAN:  #1 stage IV CLL #2 progressive anemia #3 progressive thrombocytopenia #4 recurrent infection due to hypogammaglobulinemia I had a long discussion with the patient and her husband. We reviewed the natural history of CLL. Her disease is getting  progressively worse, causing significant complications with abnormal blood counts as well as recurrent infection. I recommend bone marrow aspirate and biopsy, CT scan of the chest, abdomen and pelvis and to proceed for placement with Infuse-a-Port. My plan would be to start her on cycle 1 of chemotherapy within the next 2 weeks. My initial thoughts would be to start her Obinutuzumab with chlorambucil, which is currently in a category one recommendation for initial treatment of CLL for patients who cannot tolerate fludarabine-based chemotherapy. She will likely need to be admitted to the hospital for cycle 1 of treatment.  Orders Placed This Encounter  Procedures  . CT Chest W Contrast    Standing Status: Future     Number of Occurrences:      Standing Expiration Date: 02/05/2015    Order Specific Question:  Reason for Exam (SYMPTOM  OR DIAGNOSIS REQUIRED)    Answer:  staging CLL    Order Specific Question:  Preferred imaging location?    Answer:  [facility]  . CT Abdomen Pelvis W Contrast    Standing Status: Future     Number of Occurrences:      Standing Expiration Date: 03/08/2015    Order Specific Question:  Reason for Exam (SYMPTOM  OR DIAGNOSIS REQUIRED)    Answer:  staging CLL    Order Specific Question:  Preferred imaging location?    Answer:  [facility]  . IR Fluoro Guide CV Line Right    Need placement of port    Standing Status: Future     Number of  Occurrences:      Standing Expiration Date: 02/04/2015    Order Specific Question:  Reason for exam:    Answer:  infusaport placement    Order Specific Question:  Preferred Imaging Location?    Answer:  [facility]  . Uric Acid    Standing Status: Future     Number of Occurrences: 1     Standing Expiration Date: 12/06/2014   All questions were answered. The patient knows to call the clinic with any problems, questions or concerns. No barriers to learning was detected. I spent 40 minutes counseling the patient face to face. The total time spent in the appointment was 60 minutes and more than 50% was on counseling and review of test results     [facility], [facility], MD 12/06/2013 12:31 PM

## 2013-12-06 NOTE — Telephone Encounter (Signed)
appts made and printed. Pt is aware that cs will call w/ appts for IR, CT chest/pelvis/abd.pt cancel her appts for 12/26/13.Angelica Kitchentd

## 2013-12-06 NOTE — Progress Notes (Signed)
Pt scheduled for [facility] in Short Stay on 2/09 at 8 am.  Gave pt verbal and written instructions to arrive at 7 am,  NPO after midnight and need driver home. She verbalized understanding.  Notified Butch Penny in flow cytometry.

## 2013-12-07 LAB — DIRECT ANTIGLOBULIN TEST (NOT AT ARMC)
DAT (Complement): NEGATIVE
DAT IgG: NEGATIVE

## 2013-12-07 LAB — IGG, IGA, IGM
IgA: 6 mg/dL — ABNORMAL LOW (ref 69–380)
IgG (Immunoglobin G), Serum: 176 mg/dL — ABNORMAL LOW (ref 690–1700)
IgM, Serum: <5 mg/dL — ABNORMAL LOW (ref 52–322)

## 2013-12-09 ENCOUNTER — Other Ambulatory Visit: Payer: Self-pay | Admitting: Radiology

## 2013-12-12 ENCOUNTER — Ambulatory Visit (HOSPITAL_COMMUNITY)
Admission: RE | Admit: 2013-12-12 | Discharge: 2013-12-12 | Disposition: A | Discharge status: Home / Self Care | Payer: Medicare Other | Source: Ambulatory Visit | Attending: Hematology and Oncology | Admitting: Hematology and Oncology

## 2013-12-12 ENCOUNTER — Encounter (HOSPITAL_COMMUNITY): Payer: Self-pay

## 2013-12-12 VITALS — BP 135/43 | HR 72 | Temp 96.8°F | Resp 18 | Ht 60.0 in | Wt 114.0 lb

## 2013-12-12 DIAGNOSIS — C9110 Chronic lymphocytic leukemia of B-cell type not having achieved remission: Secondary | ICD-10-CM

## 2013-12-12 DIAGNOSIS — Z79899 Other long term (current) drug therapy: Secondary | ICD-10-CM | POA: Insufficient documentation

## 2013-12-12 DIAGNOSIS — D696 Thrombocytopenia, unspecified: Secondary | ICD-10-CM | POA: Insufficient documentation

## 2013-12-12 DIAGNOSIS — D649 Anemia, unspecified: Secondary | ICD-10-CM | POA: Insufficient documentation

## 2013-12-12 LAB — CBC
HCT: 26.8 % — ABNORMAL LOW (ref 36.0–46.0)
HEMOGLOBIN: 8.7 g/dL — AB (ref 12.0–15.0)
MCH: 32.2 pg (ref 26.0–34.0)
MCHC: 32.5 g/dL (ref 30.0–36.0)
MCV: 99.3 fL (ref 78.0–100.0)
PLATELETS: 47 10*3/uL — AB (ref 150–400)
RBC: 2.7 MIL/uL — ABNORMAL LOW (ref 3.87–5.11)
RDW: 17.6 % — ABNORMAL HIGH (ref 11.5–15.5)
WBC: 190.1 10*3/uL (ref 4.0–10.5)

## 2013-12-12 LAB — TISSUE HYBRIDIZATION TO NCBH

## 2013-12-12 LAB — BONE MARROW EXAM

## 2013-12-12 MED ORDER — MIDAZOLAM HCL 10 MG/2ML IJ SOLN
10.0000 mg | Freq: Once | INTRAMUSCULAR | Status: DC
  Filled 2013-12-12: qty 2

## 2013-12-12 MED ORDER — MIDAZOLAM HCL 2 MG/2ML IJ SOLN
INTRAMUSCULAR | Status: DC | PRN
  Administered 2013-12-12 (×4): 1 mg via INTRAVENOUS

## 2013-12-12 MED ORDER — MORPHINE SULFATE 10 MG/ML IJ SOLN
INTRAMUSCULAR | Status: DC | PRN
  Administered 2013-12-12 (×2): 1 mg via INTRAVENOUS

## 2013-12-12 MED ORDER — MORPHINE SULFATE 4 MG/ML IJ SOLN
4.0000 mg | Freq: Once | INTRAMUSCULAR | Status: DC
  Filled 2013-12-12: qty 1

## 2013-12-12 MED ORDER — SODIUM CHLORIDE 0.9 % IV SOLN
250.0000 mL | Freq: Once | INTRAVENOUS | Status: AC
  Administered 2013-12-12: 250 mL via INTRAVENOUS

## 2013-12-12 NOTE — ED Notes (Signed)
bandaid post iliac area CDI

## 2013-12-12 NOTE — Sedation Documentation (Signed)
Medication dose calculated and verified PHK:FEXMDY 4mg  IV, MORPHINE 2mg  IV

## 2013-12-12 NOTE — ED Notes (Signed)
Family updated as to patient's status.

## 2013-12-12 NOTE — ED Notes (Signed)
Vital signs stable. 

## 2013-12-12 NOTE — ED Notes (Signed)
Patient denies pain and is resting comfortably.  

## 2013-12-12 NOTE — ED Notes (Signed)
Procedure ends. Pt placed supine with bandaid to right post iliac area

## 2013-12-12 NOTE — Discharge Instructions (Signed)
Do not drive  For 24 hours Do not go into public places today May resume your regular diet and take home medications as usual May experience small amount of tingling in leg (biopsy side) May take shower and remove bandage in am For any questions or concerns, call Dr Alvy Bimler [phone] If bleeding occurs at site, hold pressure x10 minutes  If continues, call Dr Alvy Bimler Bone Marrow Aspiration and Bone Biopsy Examination of the bone marrow is a valuable test to diagnose blood disorders. A bone marrow biopsy takes a sample of bone and a small amount of fluid and cells from inside the bone. A bone marrow aspiration removes only the marrow. Bone marrow aspiration and bone biopsies are used to stage different disorders of the blood, such as leukemia. Staging will help your caregiver understand how far the disease has progressed.  The tests are also useful in diagnosing:  Fever of unknown origin (FUO).  Bacterial infections and other widespread fungal infections.  Cancers that have spread (metastasized) to the bone marrow.  Diseases that are characterized by a deficiency of an enzyme (storage diseases). This includes:  Niemann-Pick disease.  Gaucher disease. PROCEDURE  Sites used to get samples include:   Back of your hip bone (posterior iliac crest).  Both aspiration and biopsy.  Front of your hip bone (anterior iliac crest).  Both aspiration and biopsy.  Breastbone (sternum).  Aspiration from your breastbone (done only in adults). This method is rarely used. When you get a hip bone aspiration:  You are placed lying on your side with the upper knee brought up and flexed with the lower leg straight.  The site is prepared, cleaned with an antiseptic scrub, and draped. This keeps the biopsy area clean.  The skin and the area down to the lining of the bone (periosteum) are made numb with a local anesthetic.  The bone marrow aspiration needle is inserted. You will feel pressure on your  bone.  Once inside the marrow cavity, a sample of bone marrow is sucked out (aspirated) for pathology slides.  The material collected for bone marrow slides is processed immediately by a technologist.  The technician selects the marrow particles to make the slides for pathology.  The marrow aspiration needle is removed. Then pressure is applied to the site with gauze until bleeding has stopped. Following an aspiration, a bone marrow biopsy may be performed as well. The technique for this is very similar. A dressing is then applied.  RISKS AND COMPLICATIONS  The main complications of a bone marrow aspiration and biopsy include infection and bleeding.  Complications are uncommon. The procedure may not be performed in patients with bleeding tendencies.  A very rare complication from the procedure is injury to the heart during a breastbone (sternal) marrow aspiration. Only bone marrow aspirations are performed in this area.  Long-lasting pain at the site of the bone marrow aspiration and biopsy is uncommon. Your caregiver will let you know when you are to get your results and will discuss them with you. You may make an appointment with your caregiver to find out the results. Do not assume everything is normal if you have not heard from your caregiver or the medical facility. It is important for you to follow up on all of your test results. Document Released: 10/23/2004 Document Revised: 01/12/2012 Document Reviewed: 10/17/2008 [facility] Patient Information 2014 [employer]. Moderate Sedation, Adult Moderate sedation is given to help you relax or even sleep through a procedure. You may  remain sleepy, be clumsy, or have poor balance for several hours following this procedure. Arrange for a responsible adult, family member, or friend to take you home. A responsible adult should stay with you for at least 24 hours or until the medicines have worn off.  Do not participate in any activities where  you could become injured for the next 24 hours, or until you feel normal again. Do not:  Drive.  Swim.  Ride a bicycle.  Operate heavy machinery.  Cook.  Use power tools.  Climb ladders.  Work at [employer].  Do not make important decisions or sign legal documents until you are improved.  Vomiting may occur if you eat too soon. When you can drink without vomiting, try water, juice, or soup. Try solid foods if you feel little or no nausea.  Only take over-the-counter or prescription medications for pain, discomfort, or fever as directed by your caregiver.If pain medications have been prescribed for you, ask your caregiver how soon it is safe to take them.  Make sure you and your family fully understands everything about the medication given to you. Make sure you understand what side effects may occur.  You should not drink alcohol, take sleeping pills, or medications that cause drowsiness for at least 24 hours.  If you smoke, do not smoke alone.  If you are feeling better, you may resume normal activities 24 hours after receiving sedation.  Keep all appointments as scheduled. Follow all instructions.  Ask questions if you do not understand. SEEK MEDICAL CARE IF:   Your skin is pale or bluish in color.  You continue to feel sick to your stomach (nauseous) or throw up (vomit).  Your pain is getting worse and not helped by medication.  You have bleeding or swelling.  You are still sleepy or feeling clumsy after 24 hours. SEEK IMMEDIATE MEDICAL CARE IF:   You develop a rash.  You have difficulty breathing.  You develop any type of allergic problem.  You have a fever. Document Released: 07/15/2001 Document Revised: 01/12/2012 Document Reviewed: 06/27/2013 [facility] Patient Information 2014 [place].

## 2013-12-12 NOTE — Progress Notes (Signed)
Pt had labs done this am for bone marrow biopsy. Platelets were 46 Pt is scheduled for PAC placement 12/13/13. Spoke to [facility] to inform him of the platelets of 47 this AM. He would like the CBC repeated upon arrival on 12/13/13  to see what the platelets are tomorrow prior to prodedure.

## 2013-12-12 NOTE — Procedures (Signed)
Brief examination was performed. ENT: adequate airway clearance Heart: regular rate and rhythm.No Murmurs Lungs: clear to auscultation, no wheezes, normal respiratory effort  Bone Marrow Biopsy and Aspiration Procedure Note   Informed consent was obtained and potential risks including bleeding, infection and pain were reviewed with the patient. I verified that the patient has been fasting since midnight.  The patient's name, date of birth, identification, consent and allergies were verified prior to the start of procedure and time out was performed.  A total of 4 mg of IV Versed and 2 mg of IV morphine were given.  The left posterior iliac crest was chosen as the site of biopsy.  The skin was prepped with Betadine solution.   5 cc of 2% lidocaine was used to provide local anaesthesia.   10 cc of bone marrow aspirate was obtained followed by 1 inch biopsy.   The procedure was tolerated well and there were no complications.  The patient was stable at the end of the procedure.  Specimens sent for flow cytometry, cytogenetics and additional studies.

## 2013-12-12 NOTE — Progress Notes (Signed)
CRITICAL VALUE ALERT  Critical value received:  WBC 190 Date of notification:  12/12/13 Time of notification:  1500 by lab [facility]  Critical value read back:yes  Nurse who received alert:  Beatris Si RN MD notified (1st page): Dr Alvy Bimler was aware of this this am at 0830 when bone marrow biopsy  Time of first page: Dr Alvy Bimler was aware of this  MD notified (2nd page):  Time of second page:  Responding MD:  Dr Alvy Bimler  Time MD responded: This AM at 0830

## 2013-12-12 NOTE — ED Notes (Signed)
Patient is resting comfortably. 

## 2013-12-12 NOTE — ED Notes (Signed)
Ambulated in room then to BR with minimal assist . Bandaid vey small spot of blood on pad.

## 2013-12-13 ENCOUNTER — Ambulatory Visit (HOSPITAL_COMMUNITY)
Admission: RE | Admit: 2013-12-13 | Discharge: 2013-12-13 | Disposition: A | Discharge status: Home / Self Care | Payer: Medicare Other | Source: Ambulatory Visit | Attending: Hematology and Oncology | Admitting: Hematology and Oncology

## 2013-12-13 ENCOUNTER — Encounter (HOSPITAL_COMMUNITY): Payer: Self-pay

## 2013-12-13 ENCOUNTER — Ambulatory Visit (HOSPITAL_BASED_OUTPATIENT_CLINIC_OR_DEPARTMENT_OTHER)
Admission: RE | Admit: 2013-12-13 | Discharge: 2013-12-13 | Disposition: A | Discharge status: Home / Self Care | Payer: Medicare Other | Source: Ambulatory Visit | Attending: Hematology and Oncology | Admitting: Hematology and Oncology

## 2013-12-13 ENCOUNTER — Other Ambulatory Visit: Payer: Self-pay | Admitting: Hematology and Oncology

## 2013-12-13 ENCOUNTER — Encounter (HOSPITAL_COMMUNITY): Payer: Self-pay | Admitting: Pharmacy Technician

## 2013-12-13 DIAGNOSIS — D649 Anemia, unspecified: Secondary | ICD-10-CM | POA: Insufficient documentation

## 2013-12-13 DIAGNOSIS — C9110 Chronic lymphocytic leukemia of B-cell type not having achieved remission: Secondary | ICD-10-CM

## 2013-12-13 DIAGNOSIS — M810 Age-related osteoporosis without current pathological fracture: Secondary | ICD-10-CM | POA: Insufficient documentation

## 2013-12-13 DIAGNOSIS — Z79899 Other long term (current) drug therapy: Secondary | ICD-10-CM | POA: Insufficient documentation

## 2013-12-13 DIAGNOSIS — E785 Hyperlipidemia, unspecified: Secondary | ICD-10-CM | POA: Insufficient documentation

## 2013-12-13 DIAGNOSIS — D72829 Elevated white blood cell count, unspecified: Secondary | ICD-10-CM | POA: Insufficient documentation

## 2013-12-13 DIAGNOSIS — D801 Nonfamilial hypogammaglobulinemia: Secondary | ICD-10-CM | POA: Insufficient documentation

## 2013-12-13 DIAGNOSIS — K219 Gastro-esophageal reflux disease without esophagitis: Secondary | ICD-10-CM | POA: Insufficient documentation

## 2013-12-13 DIAGNOSIS — I10 Essential (primary) hypertension: Secondary | ICD-10-CM | POA: Insufficient documentation

## 2013-12-13 DIAGNOSIS — D696 Thrombocytopenia, unspecified: Secondary | ICD-10-CM | POA: Insufficient documentation

## 2013-12-13 LAB — CBC WITH DIFFERENTIAL/PLATELET
BASOS ABS: 0 10*3/uL (ref 0.0–0.1)
Basophils Relative: 0 % (ref 0–1)
EOS ABS: 0 10*3/uL (ref 0.0–0.7)
Eosinophils Relative: 0 % (ref 0–5)
HEMATOCRIT: 27.2 % — AB (ref 36.0–46.0)
Hemoglobin: 8.6 g/dL — ABNORMAL LOW (ref 12.0–15.0)
Lymphocytes Relative: 99 % — ABNORMAL HIGH (ref 12–46)
Lymphs Abs: 153.4 10*3/uL — ABNORMAL HIGH (ref 0.7–4.0)
MCH: 32.2 pg (ref 26.0–34.0)
MCHC: 31.6 g/dL (ref 30.0–36.0)
MCV: 101.9 fL — ABNORMAL HIGH (ref 78.0–100.0)
MONOS PCT: 1 % — AB (ref 3–12)
Monocytes Absolute: 1.5 10*3/uL — ABNORMAL HIGH (ref 0.1–1.0)
NEUTROS ABS: 0 10*3/uL — AB (ref 1.7–7.7)
Neutrophils Relative %: 0 % — ABNORMAL LOW (ref 43–77)
PLATELETS: 44 10*3/uL — AB (ref 150–400)
RBC: 2.67 MIL/uL — ABNORMAL LOW (ref 3.87–5.11)
RDW: 17.7 % — AB (ref 11.5–15.5)
WBC: 154.9 10*3/uL — AB (ref 4.0–10.5)

## 2013-12-13 LAB — APTT: APTT: 27 s (ref 24–37)

## 2013-12-13 LAB — TYPE AND SCREEN
ABO/RH(D): A POS
Antibody Screen: NEGATIVE

## 2013-12-13 LAB — PROTIME-INR
INR: 0.98 (ref 0.00–1.49)
Prothrombin Time: 12.8 seconds (ref 11.6–15.2)

## 2013-12-13 LAB — ABO/RH: ABO/RH(D): A POS

## 2013-12-13 MED ORDER — HEPARIN SOD (PORK) LOCK FLUSH 100 UNIT/ML IV SOLN
500.0000 [IU] | Freq: Once | INTRAVENOUS | Status: AC
  Administered 2013-12-13: 500 [IU] via INTRAVENOUS

## 2013-12-13 MED ORDER — FENTANYL CITRATE 0.05 MG/ML IJ SOLN
INTRAMUSCULAR | Status: AC | PRN
  Administered 2013-12-13: 100 ug via INTRAVENOUS

## 2013-12-13 MED ORDER — FENTANYL CITRATE 0.05 MG/ML IJ SOLN
INTRAMUSCULAR | Status: AC
  Filled 2013-12-13: qty 4

## 2013-12-13 MED ORDER — SODIUM CHLORIDE 0.9 % IV SOLN
INTRAVENOUS | Status: DC
  Administered 2013-12-13: 13:00:00 via INTRAVENOUS

## 2013-12-13 MED ORDER — CEFAZOLIN SODIUM-DEXTROSE 2-3 GM-% IV SOLR
2.0000 g | Freq: Once | INTRAVENOUS | Status: AC
  Administered 2013-12-13: 2 g via INTRAVENOUS
  Filled 2013-12-13: qty 50

## 2013-12-13 MED ORDER — MIDAZOLAM HCL 2 MG/2ML IJ SOLN
INTRAMUSCULAR | Status: AC
  Filled 2013-12-13: qty 4

## 2013-12-13 MED ORDER — HEPARIN SOD (PORK) LOCK FLUSH 100 UNIT/ML IV SOLN
INTRAVENOUS | Status: AC
  Filled 2013-12-13: qty 5

## 2013-12-13 MED ORDER — MIDAZOLAM HCL 2 MG/2ML IJ SOLN
INTRAMUSCULAR | Status: AC | PRN
  Administered 2013-12-13: 2 mg via INTRAVENOUS

## 2013-12-13 MED ORDER — DIPHENHYDRAMINE HCL 50 MG/ML IJ SOLN
25.0000 mg | Freq: Once | INTRAMUSCULAR | Status: AC
  Administered 2013-12-13: 25 mg via INTRAVENOUS
  Filled 2013-12-13: qty 1

## 2013-12-13 MED ORDER — ACETAMINOPHEN 325 MG PO TABS
650.0000 mg | ORAL_TABLET | Freq: Once | ORAL | Status: AC
  Administered 2013-12-13: 650 mg via ORAL
  Filled 2013-12-13: qty 2

## 2013-12-13 MED ORDER — LIDOCAINE HCL 1 % IJ SOLN
INTRAMUSCULAR | Status: AC
  Filled 2013-12-13: qty 20

## 2013-12-13 NOTE — H&P (Signed)
Chief Complaint: "I am here for a port." Referring Physician: Dr. Alvy Bimler HPI: Angelica Sullivan is an 80 y.o. female with PMHx of stage IV CLL, progressive leukocytosis, hypogammaglobulinemia with recent influenza, UTI and pneumonia infections, anemia and thrombocytosis. She recently underwent a bone marrow biopsy 12/12/13 by Dr. Alvy Bimler with no post procedural bleeding complications. The patient's platelets at that time were 47k. This has been discussed today with Dr. Vernard Gambles and Dr. Alvy Bimler and the patient will receive 1 unit of platelets during the procedure. The patient states her most recent UTI has been treated with antibiotics and she denies any ongoing urinary symptoms. She denies any productive cough, fever or chills. She denies any chest pain or shortness of breath. She denies any active bleeding, but does admit to some bruising where she had her peripheral IV placed yesterday for her bone marrow biopsy procedure. She denies any difficulty with sedation, she denies any history of sleep apnea or chronic oxygen use.   Past Medical History:  Past Medical History  Diagnosis Date  . Hypertension   . Hyperlipidemia   . Irritable bowel   . Osteoporosis   . GERD (gastroesophageal reflux disease)   . Rhinitis, chronic 09/15/2011  . Thrombocytopenia 09/15/2011  . Leukemia 08/06/09    CLL  . 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    Past Surgical History:  Past Surgical History  Procedure Laterality Date  . Tonsillectomy and adenoidectomy    . Hysterectomy vaginal    . Anterior and posterior vaginal repair    . Hemorrhoid surgery    . Laparoscopic cholecystectomy    . Anterior and posterior vaginal repair      repeat  . Broncoscopy      Family History:  Family History  Problem Relation Age of Onset  . Pneumonia Father     Social History:  reports that she has never smoked. She has never used smokeless tobacco. She reports that she does not drink alcohol. Her drug history is not on  file.  Allergies:  Allergies  Allergen Reactions  . Bactrim [Sulfamethoxazole W/Trimethoprim (Co-Trimoxazole)] Diarrhea and Other (See Comments)    Abdominal cramping      Medication List    ASK your doctor about these medications       ARTIFICIAL TEARS 1.4 % ophthalmic solution  Generic drug:  polyvinyl alcohol  Place 1 drop into both eyes 3 (three) times daily as needed for dry eyes.     atorvastatin 20 MG tablet  Commonly known as:  LIPITOR  Take 20 mg by mouth every evening.     calcium citrate-vitamin D 315-200 MG-UNIT per tablet  Commonly known as:  CITRACAL+D  Take 1 tablet by mouth daily.     diphenhydrAMINE 25 mg capsule  Commonly known as:  BENADRYL  Take 25 mg by mouth at bedtime as needed for allergies.     Echinacea 400 MG Caps  Take 1 capsule by mouth 2 (two) times daily.     ESTRACE VAGINAL 0.1 MG/GM vaginal cream  Generic drug:  estradiol  Place 1 Applicatorful vaginally 2 (two) times a week.     fish oil-omega-3 fatty acids 1000 MG capsule  Take 2 g by mouth daily.     glucosamine-chondroitin 500-400 MG tablet  Take 1 tablet by mouth daily.     loratadine 10 MG tablet  Commonly known as:  CLARITIN  Take 10 mg by mouth daily.     metoprolol succinate 25 MG 24 hr tablet  Commonly known  as:  TOPROL-XL  Take 12.5 mg by mouth every morning.     montelukast 10 MG tablet  Commonly known as:  SINGULAIR  Take 10 mg by mouth daily.     multivitamin tablet  Take 1 tablet by mouth daily.     sodium chloride 0.65 % Soln nasal spray  Commonly known as:  OCEAN  Place 1 spray into both nostrils 2 (two) times daily.     Vitamin D 400 UNITS capsule  Take 400 Units by mouth daily.     ZANAFLEX 4 MG tablet  Generic drug:  tiZANidine  Take 4 mg by mouth 3 (three) times a week.     zolpidem 5 MG tablet  Commonly known as:  AMBIEN  Take 5 mg by mouth at bedtime as needed for sleep.       Please HPI for pertinent positives, otherwise complete 10  system ROS negative.  Physical Exam: BP 141/52  Pulse 67  Temp(Src) 97.8 F (36.6 C) (Oral)  Resp 12  SpO2 99% There is no weight on file to calculate BMI.  General Appearance:  Alert, cooperative, no distress, ecchymotic IV insertion sites.  Head:  Normocephalic, without obvious abnormality, atraumatic  Neck: Supple, symmetrical, trachea midline  Lungs:   Clear to auscultation bilaterally, no w/r/r, respirations unlabored without use of accessory muscles.  Chest Wall:  No tenderness or deformity  Heart:  Regular rate and rhythm, S1, S2 normal, no murmur, rub or gallop.  Abdomen:   Soft, non-tender, non distended, (+) BS  Extremities: Extremities normal, no edema  Pulses: 2+ and symmetric  Neurologic: Normal affect, no gross deficits.   Results for orders placed during the hospital encounter of 12/13/13 (from the past 48 hour(s))  APTT     Status: None   Collection Time    12/13/13 12:15 PM      Result Value Range   aPTT 27  24 - 37 seconds  CBC WITH DIFFERENTIAL     Status: Abnormal   Collection Time    12/13/13 12:15 PM      Result Value Range   WBC 154.9 (*) 4.0 - 10.5 K/uL   Comment: CRITICAL RESULT CALLED TO, READ BACK BY AND VERIFIED WITH:     S.COLLINS,RN AT 1315 ON 2.10.15 BY SHEAW.   RBC 2.67 (*) 3.87 - 5.11 MIL/uL   Hemoglobin 8.6 (*) 12.0 - 15.0 g/dL   HCT 27.2 (*) 36.0 - 46.0 %   MCV 101.9 (*) 78.0 - 100.0 fL   MCH 32.2  26.0 - 34.0 pg   MCHC 31.6  30.0 - 36.0 g/dL   RDW 17.7 (*) 11.5 - 15.5 %   Platelets 44 (*) 150 - 400 K/uL   Neutrophils Relative % 0 (*) 43 - 77 %   Lymphocytes Relative 99 (*) 12 - 46 %   Monocytes Relative 1 (*) 3 - 12 %   Eosinophils Relative 0  0 - 5 %   Basophils Relative 0  0 - 1 %   Neutro Abs 0.0 (*) 1.7 - 7.7 K/uL   Lymphs Abs 153.4 (*) 0.7 - 4.0 K/uL   Monocytes Absolute 1.5 (*) 0.1 - 1.0 K/uL   Eosinophils Absolute 0.0  0.0 - 0.7 K/uL   Basophils Absolute 0.0  0.0 - 0.1 K/uL   WBC Morphology ABSOLUTE LYMPHOCYTOSIS      Comment: ATYPICAL LYMPHOCYTES     SMUDGE CELLS   Smear Review PLATELET COUNT CONFIRMED BY SMEAR    PROTIME-INR  Status: None   Collection Time    12/13/13 12:15 PM      Result Value Range   Prothrombin Time 12.8  11.6 - 15.2 seconds   INR 0.98  0.00 - 1.49  TYPE AND SCREEN     Status: None   Collection Time    12/13/13 12:20 PM      Result Value Range   ABO/RH(D) A POS     Antibody Screen NEG     Sample Expiration 12/16/2013    PREPARE PLATELET PHERESIS     Status: None   Collection Time    12/13/13 12:20 PM      Result Value Range   Unit Number [id number]     Blood Component Type PLTPHER LI2     Unit division 00     Status of Unit ISSUED     Transfusion Status OK TO TRANSFUSE     No results found.  Assessment/Plan Chronic Lymphoid Leukemia Request for image guided port a catheter placement Leukocytosis. Anemia. Hypogammaglobulinemia, afebrile, no active signs of infection.  Thrombocytopenia, platelets Dr. Vernard Gambles and Dr. Alvy Bimler aware, patient will receive 1 unit of platelets during the procedure.  Risks and Benefits of platelet transfusion and port a catheter placement has been discussed with the patient today. Signs and symptoms of post procedural bleeding has been discussed with the patient as she is at higher risk for bleeding complications. Signs and symptoms of post procedural infection has been discussed today with the patient as she is at higher risk for infection, she understands and would like to proceed with the port placement.  All of the patient's questions were answered, patient is agreeable to proceed. Consent signed and in chart.   Tsosie Billing D PA-C 12/13/2013, 2:11 PM

## 2013-12-13 NOTE — Discharge Instructions (Signed)
Leave gauze on for 24 hours and then remove. Call MD for redness, swelling, drainage at incision site. Call for fever or pain.        Implanted [facility] Guide An implanted port is a type of central line that is placed under the skin. Central lines are used to provide IV access when treatment or nutrition needs to be given through a person's veins. Implanted ports are used for long-term IV access. An implanted port may be placed because:   You need IV medicine that would be irritating to the small veins in your hands or arms.   You need long-term IV medicines, such as antibiotics.   You need IV nutrition for a long period.   You need frequent blood draws for lab tests.   You need dialysis.  Implanted ports are usually placed in the chest area, but they can also be placed in the upper arm, the abdomen, or the leg. An implanted port has two main parts:   Reservoir. The reservoir is round and will appear as a small, raised area under your skin. The reservoir is the part where a needle is inserted to give medicines or draw blood.   Catheter. The catheter is a thin, flexible tube that extends from the reservoir. The catheter is placed into a large vein. Medicine that is inserted into the reservoir goes into the catheter and then into the vein.  HOW WILL I CARE FOR MY INCISION SITE? Do not get the incision site wet. Bathe or shower as directed by your health care provider.  HOW IS MY PORT ACCESSED? Special steps must be taken to access the port:   Before the port is accessed, a numbing cream can be placed on the skin. This helps numb the skin over the port site.   Your health care provider uses a sterile technique to access the port.  Your health care provider must put on a mask and sterile gloves.  The skin over your port is cleaned carefully with an antiseptic and allowed to dry.  The port is gently pinched between sterile gloves, and a needle is inserted into the  port.  Only "non-coring" port needles should be used to access the port. Once the port is accessed, a blood return should be checked. This helps ensure that the port is in the vein and is not clogged.   If your port needs to remain accessed for a constant infusion, a clear (transparent) bandage will be placed over the needle site. The bandage and needle will need to be changed every week, or as directed by your health care provider.   Keep the bandage covering the needle clean and dry. Do not get it wet. Follow your health care provider's instructions on how to take a shower or bath while the port is accessed.   If your port does not need to stay accessed, no bandage is needed over the port.  WHAT IS FLUSHING? Flushing helps keep the port from getting clogged. Follow your health care provider's instructions on how and when to flush the port. Ports are usually flushed with saline solution or a medicine called heparin. The need for flushing will depend on how the port is used.   If the port is used for intermittent medicines or blood draws, the port will need to be flushed:   After medicines have been given.   After blood has been drawn.   As part of routine maintenance.   If a constant infusion  is running, the port may not need to be flushed.  HOW LONG WILL MY PORT STAY IMPLANTED? The port can stay in for as long as your health care provider thinks it is needed. When it is time for the port to come out, surgery will be done to remove it. The procedure is similar to the one performed when the port was put in.  WHEN SHOULD I SEEK IMMEDIATE MEDICAL CARE? When you have an implanted port, you should seek immediate medical care if:   You notice a bad smell coming from the incision site.   You have swelling, redness, or drainage at the incision site.   You have more swelling or pain at the port site or the surrounding area.   You have a fever that is not controlled with  medicine. Document Released: 10/20/2005 Document Revised: 08/10/2013 Document Reviewed: 06/27/2013 [facility] Patient Information 2014 [place].

## 2013-12-13 NOTE — Procedures (Signed)
R IJ Port cathter placement with US and fluoroscopy No complication No blood loss. See complete dictation in [facility] PACS.  

## 2013-12-14 ENCOUNTER — Ambulatory Visit (HOSPITAL_COMMUNITY)
Admission: RE | Admit: 2013-12-14 | Discharge: 2013-12-14 | Disposition: A | Discharge status: Home / Self Care | Payer: Medicare Other | Source: Ambulatory Visit | Attending: Hematology and Oncology | Admitting: Hematology and Oncology

## 2013-12-14 DIAGNOSIS — C9110 Chronic lymphocytic leukemia of B-cell type not having achieved remission: Secondary | ICD-10-CM

## 2013-12-14 DIAGNOSIS — R059 Cough, unspecified: Secondary | ICD-10-CM | POA: Insufficient documentation

## 2013-12-14 DIAGNOSIS — I517 Cardiomegaly: Secondary | ICD-10-CM | POA: Insufficient documentation

## 2013-12-14 DIAGNOSIS — R05 Cough: Secondary | ICD-10-CM | POA: Insufficient documentation

## 2013-12-14 DIAGNOSIS — R599 Enlarged lymph nodes, unspecified: Secondary | ICD-10-CM | POA: Insufficient documentation

## 2013-12-14 LAB — PREPARE PLATELET PHERESIS: Unit division: 0

## 2013-12-14 MED ORDER — IOHEXOL 300 MG/ML  SOLN
100.0000 mL | Freq: Once | INTRAMUSCULAR | Status: AC | PRN
  Administered 2013-12-14: 100 mL via INTRAVENOUS

## 2013-12-16 ENCOUNTER — Ambulatory Visit (HOSPITAL_BASED_OUTPATIENT_CLINIC_OR_DEPARTMENT_OTHER): Payer: Medicare Other | Admitting: Hematology and Oncology

## 2013-12-16 ENCOUNTER — Telehealth: Payer: Self-pay | Admitting: *Deleted

## 2013-12-16 ENCOUNTER — Other Ambulatory Visit: Payer: Self-pay | Admitting: Hematology and Oncology

## 2013-12-16 ENCOUNTER — Ambulatory Visit (HOSPITAL_BASED_OUTPATIENT_CLINIC_OR_DEPARTMENT_OTHER): Payer: Medicare Other

## 2013-12-16 ENCOUNTER — Telehealth: Payer: Self-pay | Admitting: Hematology and Oncology

## 2013-12-16 VITALS — BP 134/46 | HR 90 | Temp 97.7°F | Resp 18 | Ht 60.0 in | Wt 114.6 lb

## 2013-12-16 DIAGNOSIS — C9110 Chronic lymphocytic leukemia of B-cell type not having achieved remission: Secondary | ICD-10-CM

## 2013-12-16 DIAGNOSIS — D696 Thrombocytopenia, unspecified: Secondary | ICD-10-CM

## 2013-12-16 DIAGNOSIS — D801 Nonfamilial hypogammaglobulinemia: Secondary | ICD-10-CM

## 2013-12-16 DIAGNOSIS — D649 Anemia, unspecified: Secondary | ICD-10-CM

## 2013-12-16 LAB — COMPREHENSIVE METABOLIC PANEL (CC13)
ALT: 17 U/L (ref 0–55)
ANION GAP: 9 meq/L (ref 3–11)
AST: 24 U/L (ref 5–34)
Albumin: 4.3 g/dL (ref 3.5–5.0)
Alkaline Phosphatase: 57 U/L (ref 40–150)
BUN: 20.4 mg/dL (ref 7.0–26.0)
CALCIUM: 10.5 mg/dL — AB (ref 8.4–10.4)
CHLORIDE: 110 meq/L — AB (ref 98–109)
CO2: 25 meq/L (ref 22–29)
Creatinine: 1 mg/dL (ref 0.6–1.1)
Glucose: 87 mg/dl (ref 70–140)
Potassium: 4.7 mEq/L (ref 3.5–5.1)
Sodium: 145 mEq/L (ref 136–145)
Total Bilirubin: 0.48 mg/dL (ref 0.20–1.20)
Total Protein: 6.1 g/dL — ABNORMAL LOW (ref 6.4–8.3)

## 2013-12-16 MED ORDER — CHLORAMBUCIL 2 MG PO TABS
4.0000 mg | ORAL_TABLET | Freq: Every day | ORAL | Status: DC
Start: 2013-12-16 — End: 2013-12-22

## 2013-12-16 MED ORDER — LIDOCAINE-PRILOCAINE 2.5-2.5 % EX CREA: 1.0000 "application " | TOPICAL_CREAM | CUTANEOUS | Status: DC | PRN

## 2013-12-16 MED ORDER — ALLOPURINOL 300 MG PO TABS: 300.0000 mg | ORAL_TABLET | Freq: Every day | ORAL | Status: DC

## 2013-12-16 MED ORDER — ACYCLOVIR 400 MG PO TABS: 400.0000 mg | ORAL_TABLET | Freq: Every day | ORAL | Status: DC

## 2013-12-16 MED ORDER — ONDANSETRON HCL 8 MG PO TABS: 8.0000 mg | ORAL_TABLET | Freq: Three times a day (TID) | ORAL | Status: DC | PRN

## 2013-12-16 MED ORDER — PREDNISONE 20 MG PO TABS: 40.0000 mg | ORAL_TABLET | Freq: Every day | ORAL | Status: DC

## 2013-12-16 NOTE — Progress Notes (Signed)
[facility] OFFICE PROGRESS NOTE  Patient Care Team: Precious Reel, MD as PCP - General (Internal Medicine) Heath Lark, MD as Consulting Physician (Hematology and Oncology)  DIAGNOSIS: CLL stage IV, deletion 13 q.  SUMMARY OF ONCOLOGIC HISTORY:   CLL (chronic lymphocytic leukemia)   12/12/2013 Bone Marrow Biopsy Bone marrow biopsy show significant involvement of CLL   12/13/2013 Procedure Patient had placement of a port   12/14/2013 Imaging CT scan of the chest, abdomen and pelvis showed significant lymphadenopathy and splenomegaly    INTERVAL HISTORY: Angelica Sullivan 80 y.o. female returns for further followup. She tolerated the bone marrow biopsy well. She has some mild tenderness around her port site.  I have reviewed the past medical history, past surgical history, social history and family history with the patient and they are unchanged from previous note.  ALLERGIES:  is allergic to bactrim.  MEDICATIONS:  Current Outpatient Prescriptions  Medication Sig Dispense Refill  . atorvastatin (LIPITOR) 20 MG tablet Take 20 mg by mouth every evening.       . calcium citrate-vitamin D (CITRACAL+D) 315-200 MG-UNIT per tablet Take 1 tablet by mouth daily.       . Cholecalciferol (VITAMIN D) 400 UNITS capsule Take 400 Units by mouth daily.      . diphenhydrAMINE (BENADRYL) 25 mg capsule Take 25 mg by mouth at bedtime as needed for allergies.      . Echinacea 400 MG CAPS Take 1 capsule by mouth 2 (two) times daily.        Marland Kitchen ESTRACE VAGINAL 0.1 MG/GM vaginal cream Place 1 Applicatorful vaginally 2 (two) times a week.       . fish oil-omega-3 fatty acids 1000 MG capsule Take 2 g by mouth daily.       Marland Kitchen glucosamine-chondroitin 500-400 MG tablet Take 1 tablet by mouth daily.        Marland Kitchen loratadine (CLARITIN) 10 MG tablet Take 10 mg by mouth daily.        . metoprolol succinate (TOPROL-XL) 25 MG 24 hr tablet Take 12.5 mg by mouth every morning.       . montelukast (SINGULAIR) 10 MG  tablet Take 10 mg by mouth daily.       . Multiple Vitamin (MULTIVITAMIN) tablet Take 1 tablet by mouth daily.        . polyvinyl alcohol (ARTIFICIAL TEARS) 1.4 % ophthalmic solution Place 1 drop into both eyes 3 (three) times daily as needed for dry eyes.       . sodium chloride (OCEAN) 0.65 % SOLN nasal spray Place 1 spray into both nostrils 2 (two) times daily.       Marland Kitchen tiZANidine (ZANAFLEX) 4 MG tablet Take 4 mg by mouth 3 (three) times a week.      . zolpidem (AMBIEN) 5 MG tablet Take 5 mg by mouth at bedtime as needed for sleep.       Marland Kitchen acyclovir (ZOVIRAX) 400 MG tablet Take 1 tablet (400 mg total) by mouth 5 (five) times daily.  30 tablet  1  . allopurinol (ZYLOPRIM) 300 MG tablet Take 1 tablet (300 mg total) by mouth daily.  30 tablet  0  . chlorambucil (LEUKERAN) 2 MG tablet Take 2 tablets (4 mg total) by mouth daily. Give on an empty stomach 1 hour before or 2 hours after meals.  30 tablet  3  . lidocaine-prilocaine (EMLA) cream Apply 1 application topically as needed.  30 g  0  .  ondansetron (ZOFRAN) 8 MG tablet Take 1 tablet (8 mg total) by mouth every 8 (eight) hours as needed for nausea.  30 tablet  3  . predniSONE (DELTASONE) 20 MG tablet Take 2 tablets (40 mg total) by mouth daily with breakfast.  60 tablet  0   No current facility-administered medications for this visit.    REVIEW OF SYSTEMS:   Constitutional: Denies fevers, chills or abnormal weight loss All other systems were reviewed with the patient and are negative.  PHYSICAL EXAMINATION: ECOG PERFORMANCE STATUS: 0 - Asymptomatic  Filed Vitals:   12/16/13 1312  BP: 134/46  Pulse: 90  Temp: 97.7 F (36.5 C)  Resp: 18   Filed Weights   12/16/13 1312  Weight: 114 lb 9.6 oz (51.982 kg)    GENERAL:alert, no distress and comfortable Musculoskeletal:no cyanosis of digits and no clubbing  NEURO: alert & oriented x 3 with fluent speech, no focal motor/sensory deficits  LABORATORY DATA:  I have reviewed the data as  listed    Component Value Date/Time   NA 144 12/06/2013 0912   NA 127* 10/28/2013 2040   K 5.0 12/06/2013 0912   K 4.8 10/28/2013 2040   CL 94* 10/28/2013 2040   CL 99 04/08/2013 0922   CO2 25 12/06/2013 0912   CO2 21 10/28/2013 2040   GLUCOSE 100 12/06/2013 0912   GLUCOSE 123* 10/28/2013 2040   GLUCOSE 92 04/08/2013 0922   BUN 21.0 12/06/2013 0912   BUN 21 10/28/2013 2040   CREATININE 1.1 12/06/2013 0912   CREATININE 1.04 10/28/2013 2040   CALCIUM 10.5* 12/06/2013 0912   CALCIUM 9.4 10/28/2013 2040   PROT 6.0* 12/06/2013 0912   PROT 6.2 10/28/2013 2040   ALBUMIN 4.3 12/06/2013 0912   ALBUMIN 3.8 10/28/2013 2040   AST 22 12/06/2013 0912   AST 37 10/28/2013 2040   ALT 19 12/06/2013 0912   ALT 40* 10/28/2013 2040   ALKPHOS 54 12/06/2013 0912   ALKPHOS 110 10/28/2013 2040   BILITOT 0.56 12/06/2013 0912   BILITOT 1.3* 10/28/2013 2040   GFRNONAA 50* 10/28/2013 2040   GFRAA 58* 10/28/2013 2040    No results found for this basename: SPEP, UPEP,  kappa and lambda light chains    Lab Results  Component Value Date   WBC 154.9* 12/13/2013   NEUTROABS 0.0* 12/13/2013   HGB 8.6* 12/13/2013   HCT 27.2* 12/13/2013   MCV 101.9* 12/13/2013   PLT 44* 12/13/2013      Chemistry      Component Value Date/Time   NA 144 12/06/2013 0912   NA 127* 10/28/2013 2040   K 5.0 12/06/2013 0912   K 4.8 10/28/2013 2040   CL 94* 10/28/2013 2040   CL 99 04/08/2013 0922   CO2 25 12/06/2013 0912   CO2 21 10/28/2013 2040   BUN 21.0 12/06/2013 0912   BUN 21 10/28/2013 2040   CREATININE 1.1 12/06/2013 0912   CREATININE 1.04 10/28/2013 2040      Component Value Date/Time   CALCIUM 10.5* 12/06/2013 0912   CALCIUM 9.4 10/28/2013 2040   ALKPHOS 54 12/06/2013 0912   ALKPHOS 110 10/28/2013 2040   AST 22 12/06/2013 0912   AST 37 10/28/2013 2040   ALT 19 12/06/2013 0912   ALT 40* 10/28/2013 2040   BILITOT 0.56 12/06/2013 0912   BILITOT 1.3* 10/28/2013 2040     RADIOGRAPHIC STUDIES: I reviewed the CT scan imaging study with her and husband I  have personally reviewed the radiological images  as listed and agreed with the findings in the report.  ASSESSMENT & PLAN:  #1 CLL We discussed the role of chemotherapy. The intent is for cure.  We discussed some of the risks, benefits, side-effects of Obinutuzumab.  Some of the short term side-effects included, though not limited to, risk of fatigue, pancytopenia, life-threatening infections, allergic reactions, nausea, vomiting, sores in the mouth, changes in bowel habits especially diarrhea, admission to hospital for various reasons, and risks of death.   The patient is aware that the response rates discussed earlier is not guaranteed.    After a long discussion, patient made an informed decision to proceed with the prescribed plan of care and went ahead to sign the consent form today.   Patient education material was dispensed. I will start her on chlorambucil at 4 mg daily and prednisone at 60 mg daily. Due to high results infusion reaction, plan to admit her to the hospital next week to start cycle 1 of treatment. I plan to cap the infusion rate at maximum 100 mg per hour for day 2 #2 tumor lysis prophylaxis I will start her on allopurinol. I plan to recheck her uric acid next week. She may benefit from a Rasburicase if LDH and uric acid level is still high #3 anemia #4 Thrombocytopenia This is due to her disease. We will monitor. At present time she does not require transfusion #5 hypogammaglobulinemia This is due to CLL. We will monitor for infection carefully. I have started her on prophylaxis treatment with acyclovir.  Orders Placed This Encounter  Procedures  . Comprehensive metabolic panel    Standing Status: Future     Number of Occurrences: 1     Standing Expiration Date: 12/16/2014   All questions were answered. The patient knows to call the clinic with any problems, questions or concerns. No barriers to learning was detected. I spent 40 minutes counseling the patient  face to face. The total time spent in the appointment was 60 minutes and more than 50% was on counseling and review of test results     [facility], [place], MD 12/16/2013 2:40 PM

## 2013-12-16 NOTE — Telephone Encounter (Signed)
Called pt,no answer, mailed appt for February and March 2015

## 2013-12-16 NOTE — Telephone Encounter (Signed)
Per staff message and POF I have scheduled appts. Advised scheduler to move lab appts.  JMW  

## 2013-12-16 NOTE — Telephone Encounter (Signed)
Gave pt appt for labs , emailed michelle regarding chemo for February 2015

## 2013-12-19 ENCOUNTER — Other Ambulatory Visit: Payer: Self-pay | Admitting: *Deleted

## 2013-12-19 LAB — FISH, PERIPHERAL BLOOD

## 2013-12-20 ENCOUNTER — Telehealth: Payer: Self-pay | Admitting: *Deleted

## 2013-12-20 ENCOUNTER — Inpatient Hospital Stay (HOSPITAL_COMMUNITY)
Admission: AD | Admit: 2013-12-20 | Discharge: 2013-12-22 | DRG: 847 | Disposition: A | Discharge status: Home / Self Care | Payer: Medicare Other | Source: Ambulatory Visit | Attending: Hematology and Oncology | Admitting: Hematology and Oncology

## 2013-12-20 ENCOUNTER — Encounter (HOSPITAL_COMMUNITY): Payer: Self-pay

## 2013-12-20 ENCOUNTER — Other Ambulatory Visit: Payer: Self-pay | Admitting: *Deleted

## 2013-12-20 DIAGNOSIS — D801 Nonfamilial hypogammaglobulinemia: Secondary | ICD-10-CM

## 2013-12-20 DIAGNOSIS — K219 Gastro-esophageal reflux disease without esophagitis: Secondary | ICD-10-CM | POA: Diagnosis present

## 2013-12-20 DIAGNOSIS — I10 Essential (primary) hypertension: Secondary | ICD-10-CM

## 2013-12-20 DIAGNOSIS — D649 Anemia, unspecified: Secondary | ICD-10-CM

## 2013-12-20 DIAGNOSIS — M810 Age-related osteoporosis without current pathological fracture: Secondary | ICD-10-CM

## 2013-12-20 DIAGNOSIS — E785 Hyperlipidemia, unspecified: Secondary | ICD-10-CM | POA: Diagnosis present

## 2013-12-20 DIAGNOSIS — Z79899 Other long term (current) drug therapy: Secondary | ICD-10-CM

## 2013-12-20 DIAGNOSIS — E883 Tumor lysis syndrome: Secondary | ICD-10-CM

## 2013-12-20 DIAGNOSIS — Z5111 Encounter for antineoplastic chemotherapy: Principal | ICD-10-CM

## 2013-12-20 DIAGNOSIS — C9110 Chronic lymphocytic leukemia of B-cell type not having achieved remission: Secondary | ICD-10-CM

## 2013-12-20 DIAGNOSIS — D696 Thrombocytopenia, unspecified: Secondary | ICD-10-CM

## 2013-12-20 DIAGNOSIS — D6959 Other secondary thrombocytopenia: Secondary | ICD-10-CM | POA: Diagnosis present

## 2013-12-20 DIAGNOSIS — D630 Anemia in neoplastic disease: Secondary | ICD-10-CM | POA: Diagnosis present

## 2013-12-20 MED ORDER — EPINEPHRINE HCL 0.1 MG/ML IJ SOLN: 0.2500 mg | Freq: Once | INTRAMUSCULAR | Status: DC | PRN

## 2013-12-20 MED ORDER — METOPROLOL SUCCINATE 12.5 MG HALF TABLET
12.5000 mg | ORAL_TABLET | Freq: Every morning | ORAL | Status: DC
  Administered 2013-12-21 – 2013-12-22 (×2): 12.5 mg via ORAL
  Filled 2013-12-20 (×2): qty 1

## 2013-12-20 MED ORDER — DEXAMETHASONE SODIUM PHOSPHATE 20 MG/5ML IJ SOLN
20.0000 mg | Freq: Once | INTRAMUSCULAR | Status: DC
  Filled 2013-12-20: qty 5

## 2013-12-20 MED ORDER — FAMOTIDINE IN NACL 20-0.9 MG/50ML-% IV SOLN
20.0000 mg | Freq: Once | INTRAVENOUS | Status: AC | PRN
  Filled 2013-12-20: qty 50

## 2013-12-20 MED ORDER — EPINEPHRINE HCL 1 MG/ML IJ SOLN
0.5000 mg | Freq: Once | INTRAMUSCULAR | Status: AC | PRN
  Filled 2013-12-20: qty 1

## 2013-12-20 MED ORDER — ONDANSETRON 8 MG PO TBDP: 4.0000 mg | ORAL_TABLET | Freq: Three times a day (TID) | ORAL | Status: DC | PRN

## 2013-12-20 MED ORDER — TIZANIDINE HCL 4 MG PO TABS
4.0000 mg | ORAL_TABLET | ORAL | Status: DC
  Administered 2013-12-21: 4 mg via ORAL
  Filled 2013-12-20: qty 1

## 2013-12-20 MED ORDER — SALINE SPRAY 0.65 % NA SOLN
1.0000 | Freq: Two times a day (BID) | NASAL | Status: DC
  Administered 2013-12-20 – 2013-12-21 (×3): 1 via NASAL
  Filled 2013-12-20: qty 44

## 2013-12-20 MED ORDER — ALBUTEROL SULFATE (2.5 MG/3ML) 0.083% IN NEBU: 2.5000 mg | INHALATION_SOLUTION | Freq: Once | RESPIRATORY_TRACT | Status: AC | PRN

## 2013-12-20 MED ORDER — ALTEPLASE 2 MG IJ SOLR
2.0000 mg | Freq: Once | INTRAMUSCULAR | Status: AC | PRN
  Filled 2013-12-20: qty 2

## 2013-12-20 MED ORDER — CHLORAMBUCIL 2 MG PO TABS
4.0000 mg | ORAL_TABLET | Freq: Every day | ORAL | Status: DC
  Administered 2013-12-21: 4 mg via ORAL
  Filled 2013-12-20 (×3): qty 2

## 2013-12-20 MED ORDER — SODIUM CHLORIDE 0.9 % IV SOLN: Freq: Once | INTRAVENOUS | Status: DC

## 2013-12-20 MED ORDER — HEPARIN SOD (PORK) LOCK FLUSH 100 UNIT/ML IV SOLN: 250.0000 [IU] | Freq: Once | INTRAVENOUS | Status: AC | PRN

## 2013-12-20 MED ORDER — ONDANSETRON HCL 4 MG/2ML IJ SOLN
4.0000 mg | Freq: Three times a day (TID) | INTRAMUSCULAR | Status: DC | PRN
Start: 2013-12-20 — End: 2013-12-22

## 2013-12-20 MED ORDER — SODIUM CHLORIDE 0.9 % IJ SOLN: 3.0000 mL | INTRAMUSCULAR | Status: DC | PRN

## 2013-12-20 MED ORDER — ACETAMINOPHEN 325 MG PO TABS
650.0000 mg | ORAL_TABLET | Freq: Once | ORAL | Status: AC
  Administered 2013-12-20: 650 mg via ORAL
  Filled 2013-12-20: qty 2

## 2013-12-20 MED ORDER — CALCIUM CITRATE-VITAMIN D 315-200 MG-UNIT PO TABS
1.0000 | ORAL_TABLET | Freq: Every day | ORAL | Status: DC
  Filled 2013-12-20: qty 1

## 2013-12-20 MED ORDER — DIPHENHYDRAMINE HCL 50 MG/ML IJ SOLN: 25.0000 mg | Freq: Once | INTRAMUSCULAR | Status: AC | PRN

## 2013-12-20 MED ORDER — SENNOSIDES-DOCUSATE SODIUM 8.6-50 MG PO TABS: 1.0000 | ORAL_TABLET | Freq: Two times a day (BID) | ORAL | Status: DC | PRN

## 2013-12-20 MED ORDER — ZOLPIDEM TARTRATE 5 MG PO TABS
5.0000 mg | ORAL_TABLET | Freq: Every evening | ORAL | Status: DC | PRN
  Administered 2013-12-20 – 2013-12-21 (×2): 5 mg via ORAL
  Filled 2013-12-20 (×2): qty 1

## 2013-12-20 MED ORDER — METHYLPREDNISOLONE SODIUM SUCC 125 MG IJ SOLR
125.0000 mg | Freq: Once | INTRAMUSCULAR | Status: AC | PRN
  Filled 2013-12-20: qty 2

## 2013-12-20 MED ORDER — ACETAMINOPHEN 325 MG PO TABS: 650.0000 mg | ORAL_TABLET | ORAL | Status: DC | PRN

## 2013-12-20 MED ORDER — DIPHENHYDRAMINE HCL 50 MG/ML IJ SOLN: 50.0000 mg | Freq: Once | INTRAMUSCULAR | Status: AC | PRN

## 2013-12-20 MED ORDER — DIPHENHYDRAMINE HCL 50 MG/ML IJ SOLN
50.0000 mg | Freq: Once | INTRAMUSCULAR | Status: AC
  Administered 2013-12-21: 50 mg via INTRAVENOUS
  Filled 2013-12-20: qty 1

## 2013-12-20 MED ORDER — SODIUM CHLORIDE 0.9 % IV SOLN
100.0000 mg | Freq: Once | INTRAVENOUS | Status: AC
  Administered 2013-12-20: 100 mg via INTRAVENOUS
  Filled 2013-12-20: qty 4

## 2013-12-20 MED ORDER — ONDANSETRON HCL 8 MG PO TABS: 8.0000 mg | ORAL_TABLET | Freq: Three times a day (TID) | ORAL | Status: DC | PRN

## 2013-12-20 MED ORDER — SODIUM CHLORIDE 0.9 % IV SOLN: Freq: Once | INTRAVENOUS | Status: AC | PRN

## 2013-12-20 MED ORDER — HEPARIN SOD (PORK) LOCK FLUSH 100 UNIT/ML IV SOLN: 500.0000 [IU] | Freq: Once | INTRAVENOUS | Status: AC | PRN

## 2013-12-20 MED ORDER — MONTELUKAST SODIUM 10 MG PO TABS
10.0000 mg | ORAL_TABLET | Freq: Every day | ORAL | Status: DC
  Administered 2013-12-21 – 2013-12-22 (×2): 10 mg via ORAL
  Filled 2013-12-20 (×2): qty 1

## 2013-12-20 MED ORDER — VITAMIN D 400 UNITS PO CAPS: 400.0000 [IU] | ORAL_CAPSULE | Freq: Every day | ORAL | Status: DC

## 2013-12-20 MED ORDER — DIPHENHYDRAMINE HCL 50 MG/ML IJ SOLN
50.0000 mg | Freq: Once | INTRAMUSCULAR | Status: AC
  Administered 2013-12-20: 50 mg via INTRAVENOUS
  Filled 2013-12-20: qty 1

## 2013-12-20 MED ORDER — ONDANSETRON HCL 4 MG PO TABS: 4.0000 mg | ORAL_TABLET | Freq: Three times a day (TID) | ORAL | Status: DC | PRN

## 2013-12-20 MED ORDER — ACETAMINOPHEN 325 MG PO TABS
650.0000 mg | ORAL_TABLET | Freq: Once | ORAL | Status: AC
  Administered 2013-12-21: 650 mg via ORAL
  Filled 2013-12-20: qty 2

## 2013-12-20 MED ORDER — DEXAMETHASONE SODIUM PHOSPHATE 10 MG/ML IJ SOLN
20.0000 mg | Freq: Once | INTRAMUSCULAR | Status: AC
  Administered 2013-12-20: 20 mg via INTRAVENOUS
  Filled 2013-12-20: qty 2

## 2013-12-20 MED ORDER — SODIUM CHLORIDE 0.9 % IV SOLN
Freq: Once | INTRAVENOUS | Status: DC
Start: 2013-12-21 — End: 2013-12-22

## 2013-12-20 MED ORDER — SODIUM CHLORIDE 0.9 % IV SOLN
900.0000 mg | Freq: Once | INTRAVENOUS | Status: AC
  Administered 2013-12-21: 900 mg via INTRAVENOUS
  Filled 2013-12-20: qty 36

## 2013-12-20 MED ORDER — SODIUM CHLORIDE 0.9 % IV SOLN
INTRAVENOUS | Status: DC
  Administered 2013-12-21: 1000 mL via INTRAVENOUS

## 2013-12-20 MED ORDER — ALLOPURINOL 300 MG PO TABS
300.0000 mg | ORAL_TABLET | Freq: Every day | ORAL | Status: DC
  Administered 2013-12-21 – 2013-12-22 (×2): 300 mg via ORAL
  Filled 2013-12-20 (×2): qty 1

## 2013-12-20 MED ORDER — SODIUM CHLORIDE 0.9 % IV SOLN
6.0000 mg | Freq: Once | INTRAVENOUS | Status: AC
  Administered 2013-12-20: 6 mg via INTRAVENOUS
  Filled 2013-12-20: qty 4

## 2013-12-20 MED ORDER — ENOXAPARIN SODIUM 40 MG/0.4ML ~~LOC~~ SOLN
40.0000 mg | SUBCUTANEOUS | Status: DC
  Administered 2013-12-20: 40 mg via SUBCUTANEOUS
  Filled 2013-12-20 (×2): qty 0.4

## 2013-12-20 MED ORDER — DIPHENHYDRAMINE HCL 25 MG PO CAPS: 25.0000 mg | ORAL_CAPSULE | Freq: Every evening | ORAL | Status: DC | PRN

## 2013-12-20 MED ORDER — ACYCLOVIR 400 MG PO TABS
400.0000 mg | ORAL_TABLET | Freq: Every day | ORAL | Status: DC
  Administered 2013-12-21 – 2013-12-22 (×2): 400 mg via ORAL
  Filled 2013-12-20 (×2): qty 1

## 2013-12-20 MED ORDER — POLYVINYL ALCOHOL 1.4 % OP SOLN
1.0000 [drp] | Freq: Three times a day (TID) | OPHTHALMIC | Status: DC | PRN
  Filled 2013-12-20: qty 15

## 2013-12-20 MED ORDER — DEXAMETHASONE SODIUM PHOSPHATE 20 MG/5ML IJ SOLN: 20.0000 mg | Freq: Once | INTRAMUSCULAR | Status: DC

## 2013-12-20 MED ORDER — SODIUM CHLORIDE 0.9 % IV SOLN
Freq: Once | INTRAVENOUS | Status: AC | PRN
  Administered 2013-12-20: 11:00:00 via INTRAVENOUS

## 2013-12-20 MED ORDER — SODIUM CHLORIDE 0.9 % IJ SOLN: 10.0000 mL | INTRAMUSCULAR | Status: DC | PRN

## 2013-12-20 MED ORDER — ONDANSETRON 8 MG/NS 50 ML IVPB
8.0000 mg | Freq: Three times a day (TID) | INTRAVENOUS | Status: DC | PRN
  Filled 2013-12-20: qty 8

## 2013-12-20 MED ORDER — DEXAMETHASONE SODIUM PHOSPHATE 10 MG/ML IJ SOLN
20.0000 mg | Freq: Once | INTRAMUSCULAR | Status: AC
  Administered 2013-12-21: 20 mg via INTRAVENOUS
  Filled 2013-12-20: qty 2

## 2013-12-20 MED ORDER — ATORVASTATIN CALCIUM 20 MG PO TABS
20.0000 mg | ORAL_TABLET | Freq: Every evening | ORAL | Status: DC
  Administered 2013-12-20 – 2013-12-21 (×2): 20 mg via ORAL
  Filled 2013-12-20 (×3): qty 1

## 2013-12-20 NOTE — Care Management Note (Signed)
   CARE MANAGEMENT NOTE 12/20/2013  Patient:  Angelica Sullivan, Angelica Sullivan   Account Number:  [id number]  Date Initiated:  12/20/2013  Documentation initiated by:  Wolfe C[place]rena  Subjective/Objective Assessment:   80 yo female admitted for cycle 1 of chemo for CLL.     Action/Plan:   Home when stable   Anticipated DC Date:     Anticipated DC Plan:  [place]  CM consult      Choice offered to / List presented to:  NA   DME arranged  NA      DME agency  NA     [place] arranged  NA      [place] agency  NA   Status of service:  In process, will continue to follow Medicare Important Message given?   (If response is "NO", the following Medicare IM given date fields will be blank) Date Medicare IM given:   Date Additional Medicare IM given:    Discharge Disposition:    Per UR Regulation:  Reviewed for med. necessity/level of care/duration of stay  If discussed at [place] of Stay Meetings, dates discussed:    Comments:  12/20/13 [place] [phone] Chart review for utilization of services. No needs identified at this time.

## 2013-12-20 NOTE — Progress Notes (Signed)
Chemotherapy education completed and patient signed consent form.

## 2013-12-20 NOTE — H&P (Signed)
[facility] ADMISSION NOTE  Patient Care Team: Precious Reel, MD as PCP - General (Internal Medicine) Heath Lark, MD as Consulting Physician (Hematology and Oncology)  CHIEF COMPLAINTS/PURPOSE OF ADMISSION:  For cycle 1 of chemotherapy with chlorambucil and Obinutuzumab  HISTORY OF PRESENTING ILLNESS:  Angelica Sullivan 80 y.o. female is here because of diagnosis of CLL, requiring cycle 1 of treatment The patient was noted to have progressive leukocytosis, anemia and thrombocytopenia Bone marrow aspirate and biopsy and CT scan show significant disease. She has consented to proceed with chemotherapy as outlined above. Due to high risk infusion reaction and tumor lysis syndrome, we proceed with cycle 1 of treatment to be given as an inpatient The patient was started on chlorambucil today and prednisone since last week. She has been taking allopurinol since last week. She denies any new symptoms. She denies any recent fever, chills, night sweats or abnormal weight loss The patient denies any recent signs or symptoms of bleeding such as spontaneous epistaxis, hematuria or hematochezia.   MEDICAL HISTORY:  Past Medical History  Diagnosis Date  . Hypertension   . Hyperlipidemia   . Irritable bowel   . Osteoporosis   . GERD (gastroesophageal reflux disease)   . Rhinitis, chronic 09/15/2011  . Thrombocytopenia 09/15/2011  . Leukemia 08/06/09    CLL  . 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    SURGICAL HISTORY: Past Surgical History  Procedure Laterality Date  . Tonsillectomy and adenoidectomy    . Hysterectomy vaginal    . Anterior and posterior vaginal repair    . Hemorrhoid surgery    . Laparoscopic cholecystectomy    . Anterior and posterior vaginal repair      repeat  . Broncoscopy      SOCIAL HISTORY: History   Social History  . Marital Status: Married    Spouse Name: N/A    Number of Children: N/A  . Years of Education: N/A   Occupational History  .  Not on file.   Social History Main Topics  . Smoking status: Never Smoker   . Smokeless tobacco: Never Used  . Alcohol Use: No  . Drug Use: Not on file  . Sexual Activity: No   Other Topics Concern  . Not on file   Social History Narrative  . No narrative on file    FAMILY HISTORY: Family History  Problem Relation Age of Onset  . Pneumonia Father     ALLERGIES:  is allergic to bactrim.  MEDICATIONS:  Current Facility-Administered Medications  Medication Dose Route Frequency Provider Last Rate Last Dose  . 0.9 %  sodium chloride infusion   Intravenous Once Heath Lark, MD      . acetaminophen (TYLENOL) tablet 650 mg  650 mg Oral Once Heath Lark, MD      . albuterol (PROVENTIL) (2.5 MG/3ML) 0.083% nebulizer solution 2.5 mg  2.5 mg Nebulization Once PRN Heath Lark, MD      . alteplase (CATHFLO ACTIVASE) injection 2 mg  2 mg Intracatheter Once PRN Heath Lark, MD      . dexamethasone (DECADRON) injection 20 mg  20 mg Intravenous Once Heath Lark, MD      . diphenhydrAMINE (BENADRYL) injection 25 mg  25 mg Intravenous Once PRN Heath Lark, MD      . diphenhydrAMINE (BENADRYL) injection 50 mg  50 mg Intravenous Once Heath Lark, MD      . diphenhydrAMINE (BENADRYL) injection 50 mg  50 mg Intravenous Once PRN Heath Lark, MD      .  EPINEPHrine (ADRENALIN) injection 0.5 mg  0.5 mg Subcutaneous Once PRN Heath Lark, MD      . EPINEPHrine (ADRENALIN) injection 0.5 mg  0.5 mg Subcutaneous Once PRN Heath Lark, MD      . famotidine (PEPCID) IVPB 20 mg  20 mg Intravenous Once PRN Heath Lark, MD      . heparin lock flush 100 unit/mL  500 Units Intracatheter Once PRN Heath Lark, MD      . heparin lock flush 100 unit/mL  250 Units Intracatheter Once PRN Heath Lark, MD      . methylPREDNISolone sodium succinate (SOLU-MEDROL) 125 mg/2 mL injection 125 mg  125 mg Intravenous Once PRN Heath Lark, MD      . obinutuzumab (GAZYVA) 100 mg in sodium chloride 0.9 % 250 mL chemo infusion  100 mg Intravenous  Once Heath Lark, MD      . sodium chloride 0.9 % injection 10 mL  10 mL Intracatheter PRN Donovin Kraemer, MD      . sodium chloride 0.9 % injection 3 mL  3 mL Intravenous PRN Heath Lark, MD        REVIEW OF SYSTEMS:   Constitutional: Denies fevers, chills or abnormal night sweats Eyes: Denies blurriness of vision, double vision or watery eyes Ears, nose, mouth, throat, and face: Denies mucositis or sore throat Respiratory: Denies cough, dyspnea or wheezes Cardiovascular: Denies palpitation, chest discomfort or lower extremity swelling Gastrointestinal:  Denies nausea, heartburn or change in bowel habits Skin: Denies abnormal skin rashes Lymphatics: Denies new lymphadenopathy or easy bruising Neurological:Denies numbness, tingling or new weaknesses Behavioral/Psych: Mood is stable, no new changes  All other systems were reviewed with the patient and are negative.  PHYSICAL EXAMINATION: ECOG PERFORMANCE STATUS: 1 - Symptomatic but completely ambulatory  Filed Vitals:   12/20/13 1105  BP: 149/64  Pulse: 88  Temp: 97.5 F (36.4 C)  Resp: 16   Filed Weights   12/20/13 1105  Weight: 113 lb 11.2 oz (51.574 kg)    GENERAL:alert, no distress and comfortable SKIN: skin color, texture, turgor are normal, no rashes . She has significant keratosis throughout  EYES: normal, conjunctiva are pink and non-injected, sclera clear OROPHARYNX:no exudate, no erythema and lips, buccal mucosa, and tongue normal  NECK: supple, thyroid normal size, non-tender, without nodularity LYMPH:  She has palpable lymphadenopathy in the cervical and axillary  LUNGS: clear to auscultation and percussion with normal breathing effort HEART: regular rate & rhythm and no murmurs and no lower extremity edema ABDOMEN:abdomen soft, non-tender and normal bowel sounds. She has palpable splenomegaly Musculoskeletal:no cyanosis of digits and no clubbing  PSYCH: alert & oriented x 3 with fluent speech NEURO: no focal  motor/sensory deficits  LABORATORY DATA:  I have reviewed the data as listed Lab Results  Component Value Date   WBC 154.9* 12/13/2013   HGB 8.6* 12/13/2013   HCT 27.2* 12/13/2013   MCV 101.9* 12/13/2013   PLT 44* 12/13/2013   Lab Results  Component Value Date   NA 145 12/16/2013   K 4.7 12/16/2013   CL 94* 10/28/2013   CO2 25 12/16/2013    RADIOGRAPHIC STUDIES: I have personally reviewed the radiological images as listed and agreed with the findings in the report. Ct Chest W Contrast  12/14/2013   CLINICAL DATA:  Chronic lymphocytic leukemia. Diagnosed in 2010. Chemotherapy to start. Cough. Staging.  EXAM: CT CHEST, ABDOMEN, AND PELVIS WITH CONTRAST  TECHNIQUE: Multidetector CT imaging of the chest, abdomen and pelvis was performed  following the standard protocol during bolus administration of intravenous contrast.  CONTRAST:  118mL OMNIPAQUE IOHEXOL 300 MG/ML  SOLN  COMPARISON:  CT CHEST W/O CM dated 08/01/2013; CT ABD/PELVIS W CM dated 08/30/2012  FINDINGS: CT CHEST FINDINGS  Lungs/Pleura: Vague right apical ground-glass opacity on images 9 and 10 is unchanged.  Similar 2 mm left apical lung nodule on image 17.  No lobar consolidation.  No pleural fluid.  Heart/Mediastinum: Low cervical/ supraclavicular adenopathy is suboptimally evaluated bilaterally. Grossly similar to on the prior. A right-sided Port-A-Cath terminates at the low SVC the.  Right greater than left axillary adenopathy. Index right axillary node measures 1.3 cm on image 14 and appears similar to 1.2 cm on the prior. Immediately inferior 1.3 cm node on image 15 is unchanged since the prior.  Mild cardiomegaly, without pericardial effusion. No central pulmonary embolism, on this non-dedicated study.  Calcified mediastinal nodes are related to old granulomatous disease and chronic. No uncalcified mediastinal or hilar adenopathy.  CT ABDOMEN AND PELVIS FINDINGS  Abdomen/Pelvis: Mild hepatomegaly, 17.8 cm craniocaudal. The vague hypo  attenuating right liver lobe lesion detailed on the prior exam is no longer present. Splenomegaly at 15.7 cm craniocaudal. Slightly increased from 14.6 cm on the prior.  Small hiatal hernia. Normal distal stomach, pancreas. Cholecystectomy, without biliary ductal dilatation. Normal adrenal glands. Mild left renal cortical scarring. A right extrarenal pelvis.  Bulky retroperitoneal adenopathy. An index anterior left periaortic node measures 2.5 x 1.4 cm today versus 2.2 x 1.4 cm on 08/30/2012.  Aortocaval node measures 2.0 cm on image 74 versus 1.7 cm at the same level on the 08/30/2012 exam.  Portal caval node measures 3.1 cm on image 58 versus 2.7 cm on the prior.  Normal colon and terminal ileum.  Mesenteric adenopathy. Right-sided node measures 2.4 cm on image 77 versus 2.1 cm at the same level on the prior. No bowel obstruction.  Pelvic adenopathy, with a right external iliac node measuring 1.2 cm on image 95 versus 1.3 cm on the prior.  Hysterectomy. Normal urinary bladder. Mild pelvic floor laxity. No significant free fluid.  Bones/Musculoskeletal: Degenerative cyst in the right acetabulum. Thoracolumbar degenerative disc disease.  IMPRESSION: CT CHEST IMPRESSION  1. Since 08/01/2013, similar right greater than left axillary adenopathy. 2. Similar vague right apical ground-glass opacity. This could be re-evaluated at followup. Considerations include an area of focal fibrosis or indolent neoplasm such as low-grade adenocarcinoma. 3. Low cervical adenopathy which is suboptimally evaluated.  CT ABDOMEN AND PELVIS IMPRESSION  1. Since 08/30/2012, progression of adenopathy and splenomegaly. The nonspecific right liver lobe lesion detailed on the prior exam has resolved. 2. No acute superimposed process. 3. Mild pelvic floor laxity.   Electronically Signed   By: Abigail Miyamoto M.D.   On: 12/14/2013 10:30   Ct Abdomen Pelvis W Contrast  12/14/2013   CLINICAL DATA:  Chronic lymphocytic leukemia. Diagnosed in 2010.  Chemotherapy to start. Cough. Staging.  EXAM: CT CHEST, ABDOMEN, AND PELVIS WITH CONTRAST  TECHNIQUE: Multidetector CT imaging of the chest, abdomen and pelvis was performed following the standard protocol during bolus administration of intravenous contrast.  CONTRAST:  132mL OMNIPAQUE IOHEXOL 300 MG/ML  SOLN  COMPARISON:  CT CHEST W/O CM dated 08/01/2013; CT ABD/PELVIS W CM dated 08/30/2012  FINDINGS: CT CHEST FINDINGS  Lungs/Pleura: Vague right apical ground-glass opacity on images 9 and 10 is unchanged.  Similar 2 mm left apical lung nodule on image 17.  No lobar consolidation.  No pleural fluid.  Heart/Mediastinum: Low cervical/ supraclavicular adenopathy is suboptimally evaluated bilaterally. Grossly similar to on the prior. A right-sided Port-A-Cath terminates at the low SVC the.  Right greater than left axillary adenopathy. Index right axillary node measures 1.3 cm on image 14 and appears similar to 1.2 cm on the prior. Immediately inferior 1.3 cm node on image 15 is unchanged since the prior.  Mild cardiomegaly, without pericardial effusion. No central pulmonary embolism, on this non-dedicated study.  Calcified mediastinal nodes are related to old granulomatous disease and chronic. No uncalcified mediastinal or hilar adenopathy.  CT ABDOMEN AND PELVIS FINDINGS  Abdomen/Pelvis: Mild hepatomegaly, 17.8 cm craniocaudal. The vague hypo attenuating right liver lobe lesion detailed on the prior exam is no longer present. Splenomegaly at 15.7 cm craniocaudal. Slightly increased from 14.6 cm on the prior.  Small hiatal hernia. Normal distal stomach, pancreas. Cholecystectomy, without biliary ductal dilatation. Normal adrenal glands. Mild left renal cortical scarring. A right extrarenal pelvis.  Bulky retroperitoneal adenopathy. An index anterior left periaortic node measures 2.5 x 1.4 cm today versus 2.2 x 1.4 cm on 08/30/2012.  Aortocaval node measures 2.0 cm on image 74 versus 1.7 cm at the same level on the  08/30/2012 exam.  Portal caval node measures 3.1 cm on image 58 versus 2.7 cm on the prior.  Normal colon and terminal ileum.  Mesenteric adenopathy. Right-sided node measures 2.4 cm on image 77 versus 2.1 cm at the same level on the prior. No bowel obstruction.  Pelvic adenopathy, with a right external iliac node measuring 1.2 cm on image 95 versus 1.3 cm on the prior.  Hysterectomy. Normal urinary bladder. Mild pelvic floor laxity. No significant free fluid.  Bones/Musculoskeletal: Degenerative cyst in the right acetabulum. Thoracolumbar degenerative disc disease.  IMPRESSION: CT CHEST IMPRESSION  1. Since 08/01/2013, similar right greater than left axillary adenopathy. 2. Similar vague right apical ground-glass opacity. This could be re-evaluated at followup. Considerations include an area of focal fibrosis or indolent neoplasm such as low-grade adenocarcinoma. 3. Low cervical adenopathy which is suboptimally evaluated.  CT ABDOMEN AND PELVIS IMPRESSION  1. Since 08/30/2012, progression of adenopathy and splenomegaly. The nonspecific right liver lobe lesion detailed on the prior exam has resolved. 2. No acute superimposed process. 3. Mild pelvic floor laxity.   Electronically Signed   By: Abigail Miyamoto M.D.   On: 12/14/2013 10:30   ASSESSMENT & PLAN:  #1 CLL, stage IV We discussed the role of chemotherapy. The intent is for cure.  We discussed some of the risks, benefits, side-effects of Obinutuzumab & chlorambucil.  She will continue on chlorambucil at 4 mg daily. I will discontinue prednisone while she is in the hospital.  She will need to be in the hospital for monitoring for infusion reaction and also for IV hydration due to high risk of tumor lysis syndrome  #2 tumor lysis prophylaxis   she will continue on  Allopurinol. I will also order Rasburicase  an IV fluids. I will recheck LDH and uric acid level daily  #3 anemia  #4 Thrombocytopenia  This is due to her disease. We will monitor. At present  time she does not require transfusion.  she did receive platelet transfusion last week  #5 hypogammaglobulinemia  This is due to CLL. We will monitor for infection carefully. I have started her on prophylaxis treatment with acyclovir.  #6 DVT prophylaxis I will put on Lovenox   Orders Placed This Encounter  Procedures  . Treatment conditions    Proceed with chemotherapy  without repeating labs today. Consent was obtained from out-patient clinic    Standing Status: Standing     Number of Occurrences: 1     Standing Expiration Date:   . chemotherapy pharmacy monitoring    Standing Status: Standing     Number of Occurrences: 1     Standing Expiration Date:       Chelci [place], MD @T @ 12:05 PM

## 2013-12-20 NOTE — Telephone Encounter (Signed)
Pt states she won't be able to leave her house until after 10 am due to inclement weather.  She is scheduled for admission to [facility] today.  Confirmed w/ Caren Griffins on 3 E bed is available and they will hold for pt's arrival.   Instructed pt to come in for admission as soon as possible as long as it is safe.  She verbalized understanding.

## 2013-12-20 NOTE — Telephone Encounter (Signed)
Per staff message and POF I have scheduled appts.  JMW  

## 2013-12-21 ENCOUNTER — Other Ambulatory Visit: Payer: Self-pay | Admitting: Hematology and Oncology

## 2013-12-21 LAB — CBC WITH DIFFERENTIAL/PLATELET
BASOS ABS: 0 10*3/uL (ref 0.0–0.1)
BASOS PCT: 0 % (ref 0–1)
Band Neutrophils: 0 % (ref 0–10)
Blasts: 0 %
EOS ABS: 0 10*3/uL (ref 0.0–0.7)
EOS PCT: 0 % (ref 0–5)
HCT: 19.1 % — ABNORMAL LOW (ref 36.0–46.0)
HEMOGLOBIN: 6.1 g/dL — AB (ref 12.0–15.0)
LYMPHS PCT: 95 % — AB (ref 12–46)
Lymphs Abs: 83.9 10*3/uL — ABNORMAL HIGH (ref 0.7–4.0)
MCH: 32.3 pg (ref 26.0–34.0)
MCHC: 31.9 g/dL (ref 30.0–36.0)
MCV: 101.1 fL — ABNORMAL HIGH (ref 78.0–100.0)
Metamyelocytes Relative: 0 %
Monocytes Absolute: 0 10*3/uL — ABNORMAL LOW (ref 0.1–1.0)
Monocytes Relative: 0 % — ABNORMAL LOW (ref 3–12)
Myelocytes: 0 %
NEUTROS ABS: 4.4 10*3/uL (ref 1.7–7.7)
NEUTROS PCT: 5 % — AB (ref 43–77)
Platelets: 19 10*3/uL — CL (ref 150–400)
Promyelocytes Absolute: 0 %
RBC: 1.89 MIL/uL — AB (ref 3.87–5.11)
RDW: 17.8 % — ABNORMAL HIGH (ref 11.5–15.5)
WBC: 88.3 10*3/uL (ref 4.0–10.5)
nRBC: 0 /100 WBC

## 2013-12-21 LAB — COMPREHENSIVE METABOLIC PANEL
ALT: 96 U/L — ABNORMAL HIGH (ref 0–35)
AST: 93 U/L — ABNORMAL HIGH (ref 0–37)
Albumin: 3.2 g/dL — ABNORMAL LOW (ref 3.5–5.2)
Alkaline Phosphatase: 69 U/L (ref 39–117)
BUN: 24 mg/dL — ABNORMAL HIGH (ref 6–23)
CALCIUM: 8.8 mg/dL (ref 8.4–10.5)
CO2: 21 meq/L (ref 19–32)
Chloride: 109 mEq/L (ref 96–112)
Creatinine, Ser: 0.89 mg/dL (ref 0.50–1.10)
GFR, EST AFRICAN AMERICAN: 70 mL/min — AB (ref >90)
GFR, EST NON AFRICAN AMERICAN: 60 mL/min — AB (ref >90)
GLUCOSE: 117 mg/dL — AB (ref 70–99)
Potassium: 4.1 mEq/L (ref 3.7–5.3)
Sodium: 141 mEq/L (ref 137–147)
Total Bilirubin: 0.3 mg/dL (ref 0.3–1.2)
Total Protein: 4.9 g/dL — ABNORMAL LOW (ref 6.0–8.3)

## 2013-12-21 LAB — URIC ACID: Uric Acid, Serum: 0.2 mg/dL — ABNORMAL LOW (ref 2.4–7.0)

## 2013-12-21 LAB — LACTATE DEHYDROGENASE: LDH: 393 U/L — ABNORMAL HIGH (ref 94–250)

## 2013-12-21 LAB — PREPARE RBC (CROSSMATCH)

## 2013-12-21 MED ORDER — FUROSEMIDE 10 MG/ML IJ SOLN
20.0000 mg | Freq: Once | INTRAMUSCULAR | Status: AC
  Administered 2013-12-21: 20 mg via INTRAVENOUS
  Filled 2013-12-21: qty 2

## 2013-12-21 MED ORDER — CALCIUM CARBONATE-VITAMIN D 250-125 MG-UNIT PO TABS
1.0000 | ORAL_TABLET | Freq: Every day | ORAL | Status: DC
  Administered 2013-12-21 – 2013-12-22 (×2): 1 via ORAL
  Filled 2013-12-21 (×3): qty 1

## 2013-12-21 NOTE — Discharge Summary (Signed)
Physician Discharge Summary     [facility]  [phone]    Angelica Sullivan  MR#: [id number]  DOB:04-Dec-1932  Date of Admission: 12/20/2013 Date of Discharge: 12/21/2013  Attending Physician:WERTMAN,SARA E  Patient's PZW:Angelica M, MD  Discharge Diagnoses: Present on Admission:  . CLL (chronic lymphocytic leukemia)  Consults:   Disposition: 01-Home or Self Care  Discharge Condition: Stable  Hospital Course:  Angelica Sullivan 80 y.o. female  With a diagnosis of CLL , admitted on 12/20/13  for cycle 1 of treatment  With Obinutuzumab & chlorambucil 4 mg. Due to very high disease burden, risk of reinfusion reaction and complications are high. She was admitted to the hospital for cycle 1 day 1 of treatment. She tolerated infusion well without infusion reaction. She was noted to have severe thrombocytopenia and anemia, related to her disease, requiring transfusions of blood products.     #1. CLL.  She tolerated infusion well without side effects. Her white count has reduced to 22,000 at the time of discharge    #2 anemia  The most likely cause of this is hemolysis from CLL. Hb was 8.6 on admission,dropping to 6.1 on 2/18, without acute bleeding issues.  2 units of blood were transfused after discussing the risks benefits and alternative of blood transfusions. At the time of the procedure, she was symptomatic. Her hemoglobin has improved to 8.7 at the time of discharge   #3 thrombocytopenia:  secondary to CLL. Last platelet transfusion was performed as outpatient last week. During this hospitalization,her platelets dropped  From 44,000 on 2/17 to to 19,000 on 2/18 . She is not on acute bleeding. These counts are being closely monitored. Platelet transfusion was held. Lovenox and chlorambucil were held during the hospitalization  #4tumor lysis prophylaxis  She received  Allopurinol. Rasburicase was given. LDH is high but uric acid level is down. She is  receiving IV fluids.  LDH and uric acid levels were checked daily . On 12/21/2013 her LDH is 393 from 174 on 12/06/13. 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 prophylaxis treatment with acyclovir was initiated on admission.   6 DVT prophylaxis  Lovenox was placed on hold onto 1815 after her platelet count dropped significantly to 19,000. Instead, she had TED hose placed for prophylaxis  Today she is afebrile vital signs are stable,she denies any bleeding issues, fever or chills. No apparent reaction to the chemotherapy has been noted. She is to be discharged home in stable condition.  LABORATORY RESULTS  CBC: Lab Results  Component Value Date   WBC 26.0* 12/22/2013   HGB 8.7* 12/22/2013   HCT 25.4* 12/22/2013   MCV 94.1 12/22/2013   PLT 17* 12/22/2013    Chemistries    Recent Labs Lab 12/16/13 1401 12/21/13 0525  NA 145 141  K 4.7 4.1  CL  --  109  CO2 25 21  GLUCOSE 87 117*  BUN 20.4 24*  CREATININE 1.0 0.89  CALCIUM 10.5* 8.8    Discharge Exam: Blood pressure 124/43, pulse 82, temperature 97.4 F (36.3 C), temperature source Oral, resp. rate 18, height 5' (1.524 Sullivan), weight 113 lb 11.2 oz (51.574 kg), SpO2 99.00%.  HEENT: PERRLA. Sclerae anicteric. Oral cavity without thrush or lesions. LUNGS: clear bilaterally .  BREASTS: not examined. CARDIOVASCULAR: regular rate and rhythm ABDOMEN: soft, nontender, bowel sounds x4. EXTREMITIES: no clubbing cyanosis or edema.  MUSCULOSKELETAL: no  spinal tenderness.     Disposition: 01-Home or Self Care   Future Appointments Provider Department Dept Phone   12/27/2013 9:45 AM [facility] [phone]   12/27/2013 10:00 AM Heath Lark, MD [facility] [phone]   12/27/2013 10:15 AM [facility] [phone]    01/03/2014 11:30 AM [facility] [phone]   01/03/2014 12:00 PM [facility] [phone]   01/10/2014 12:00 PM [facility] [phone]   01/10/2014 12:30 PM [facility] [phone]   01/17/2014 11:45 AM [facility] [phone]   01/17/2014 12:15 PM [facility] [phone]      Signed: Rondel Jumbo 12/21/2013, 2:19 PM Danene Montijo, MD 12/22/2013

## 2013-12-21 NOTE — Progress Notes (Signed)
CRITICAL VALUE ALERT  Critical value received:  hgb 6.1, plt 19  Date of notification:  2/18  Time of notification:  0608  Critical value read back:yes  Nurse who received alert:  melvena RN, Orlan Leavens    MD notified (1st page):  2/18  Time of first page:  0710  MD notified (2nd page):  Time of second page:  Responding MD:  Dr Julien Nordmann-- ordered to page primary MD   Time MD responded:  [phone]

## 2013-12-21 NOTE — Progress Notes (Signed)
[facility]  Telephone:([phone]   I have seen the patient, examined her, and agree with the assessment and plan as outlined below HOSPITAL PROGRESS NOTE  Events since admission on 2/17   Noted. No chemo reaction to date. Resting comfortably in bed, no complaints.  Complaint of feeling fatigued. The patient denies any recent signs or symptoms of bleeding such as spontaneous epistaxis, hematuria or hematochezia.  MEDICATIONS: Scheduled Meds: . sodium chloride   Intravenous Once  . sodium chloride   Intravenous Once  . acyclovir  400 mg Oral Daily  . allopurinol  300 mg Oral Daily  . atorvastatin  20 mg Oral QPM  . calcium-vitamin D  1 tablet Oral Daily  . chlorambucil  4 mg Oral Daily  . enoxaparin (LOVENOX) injection  40 mg Subcutaneous Q24H  . metoprolol succinate  12.5 mg Oral q morning - 10a  . montelukast  10 mg Oral Daily  . obinutuzumab  900 mg Intravenous Once  . sodium chloride  1 spray Each Nare BID  . tiZANidine  4 mg Oral Q M,W,F   Continuous Infusions: . sodium chloride 75 mL/hr at 12/20/13 1331   PRN Meds:.sodium chloride, acetaminophen, albuterol, alteplase, diphenhydrAMINE, diphenhydrAMINE, EPINEPHrine, EPINEPHrine, famotidine, heparin lock flush, heparin lock flush, methylPREDNISolone sodium succinate, ondansetron (ZOFRAN) IV, ondansetron (ZOFRAN) IV, ondansetron, ondansetron, polyvinyl alcohol, senna-docusate, sodium chloride, sodium chloride, sodium chloride, sodium chloride, zolpidem  ALLERGIES:   Allergies  Allergen Reactions  . Bactrim [Sulfamethoxazole W/Trimethoprim (Co-Trimoxazole)] Diarrhea and Other (See Comments)    Abdominal cramping     PHYSICAL EXAMINATION:   Filed Vitals:   12/21/13 0537  BP: 108/44  Pulse: 73  Temp: 97.8 F (36.6 C)  Resp: 16   Filed Weights   12/20/13 1105  Weight: 113 lb 11.2 oz (51.56 kg)    80 year old  in no acute distress, alert and oriented in person place and time General  well-developed and well-nourished  HEENT: Normocephalic, atraumatic, PERRLA. Oral cavity without thrush or lesions. Neck supple. no thyromegaly, no cervical or supraclavicular adenopathy  Lungs clear bilaterally . No wheezing, rhonchi or rales. Right port negative Cardiac: regular rate and rhythm,no murmur , rubs or gallops Abdomen soft nontender , bowel sounds x4. No hepatosplenomegaly  Extremities no clubbing cyanosis or edema. No bruising or petechial rash Neuro: non focal  LABORATORY/RADIOLOGY DATA:   Recent Labs Lab 12/21/13 0525  WBC 88.3*  HGB 6.1*  HCT 19.1*  PLT 19*  MCV 101.1*  MCH 32.3  MCHC 31.9  RDW 17.8*  LYMPHSABS PENDING  MONOABS PENDING  EOSABS PENDING  BASOSABS PENDING    CMP    Recent Labs Lab 12/16/13 1401 12/21/13 0525  NA 145 141  K 4.7 4.1  CL  --  109  CO2 25 21  GLUCOSE 87 117*  BUN 20.4 24*  CREATININE 1.0 0.89  CALCIUM 10.5* 8.8  AST 24 93*  ALT 17 96*  ALKPHOS 57 69  BILITOT 0.48 0.3        Component Value Date/Time   BILITOT 0.3 12/21/2013 0525   BILITOT 0.48 12/16/2013 1401        Component Value Date/Time   ESRSEDRATE 13 08/02/2009 1535     Liver Function Tests:  Recent Labs Lab 12/16/13 1401 12/21/13 0525  AST 24 93*  ALT 17 96*  ALKPHOS 57 69  BILITOT 0.48 0.3  PROT 6.1* 4.9*  ALBUMIN 4.3 3.2*    ASSESSMENT AND PLAN:  #1 CLL, stage IV  The patient was started on prednisone last week. Chlorambucil started yesterday. She tolerated infusional treatment yesterday without infusion reaction. We'll proceed with the remainder of Obinutuzumab today.  Her blood counts has dropped significantly today. I will hold chlorambucil. Prednisone discontinued  while she is in the hospital.  She will need to be in the hospital for monitoring for infusion reaction and also for IV hydration due to high risk of tumor lysis syndrome  #2 tumor lysis prophylaxis  she will continue on Allopurinol, Rasburicase was given. LDH is  high but uric acid level is down. She is receiving IV fluids. Continue to check LDH and uric acid level daily . Today's LDH is 393 from  174 on 12/06/13.  Her uric acid is 0.2 from  7.7 on 2/3. #3 anemia  The most likely cause of this is hemolysis from CLL. Hb is 6.1 today from 8.6 on admission without acute bleeding issues. Will need 2 units of blood today and check CBC in the morning.  We discussed some of the risks, benefits, and alternatives of blood transfusions. The patient is symptomatic from anemia and the hemoglobin level is critically low.  Some of the side-effects to be expected including risks of transfusion reactions, chills, infection, syndrome of volume overload and risk of hospitalization from various reasons and the patient is willing to proceed and went ahead to sign consent today. #4 Thrombocytopenia  This is due to her disease. Dropped from 44k to 19 k today.She did receive platelet transfusion last week . Will hold chlorambucil and Lovenox today.  No platelets transfusion is needed today. 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 We will monitor for infection carefully.  prophylaxis treatment with acyclovir was initiated on admission.  #6 DVT prophylaxis  Lovenox is on hold. We will place TED hose #7 discharge planning If her blood count is stable and there is no evidence of tumor lysis syndrome tomorrow, I will discharge her tomorrow  Rondel Jumbo, PA-C 12/21/2013, 7:26 AM  Lilyana Lippman, MD 12/21/2013

## 2013-12-22 ENCOUNTER — Other Ambulatory Visit: Payer: Self-pay | Admitting: Hematology and Oncology

## 2013-12-22 LAB — CBC WITH DIFFERENTIAL/PLATELET
BASOS PCT: 0 % (ref 0–1)
Basophils Absolute: 0 10*3/uL (ref 0.0–0.1)
Eosinophils Absolute: 0 10*3/uL (ref 0.0–0.7)
Eosinophils Relative: 0 % (ref 0–5)
HEMATOCRIT: 25.4 % — AB (ref 36.0–46.0)
HEMOGLOBIN: 8.7 g/dL — AB (ref 12.0–15.0)
LYMPHS ABS: 22.9 10*3/uL — AB (ref 0.7–4.0)
Lymphocytes Relative: 88 % — ABNORMAL HIGH (ref 12–46)
MCH: 32.2 pg (ref 26.0–34.0)
MCHC: 34.3 g/dL (ref 30.0–36.0)
MCV: 94.1 fL (ref 78.0–100.0)
MONO ABS: 1 10*3/uL (ref 0.1–1.0)
MONOS PCT: 4 % (ref 3–12)
NEUTROS ABS: 2.1 10*3/uL (ref 1.7–7.7)
Neutrophils Relative %: 8 % — ABNORMAL LOW (ref 43–77)
Platelets: 17 10*3/uL — CL (ref 150–400)
RBC: 2.7 MIL/uL — AB (ref 3.87–5.11)
RDW: 17.9 % — ABNORMAL HIGH (ref 11.5–15.5)
WBC: 26 10*3/uL — AB (ref 4.0–10.5)

## 2013-12-22 LAB — TYPE AND SCREEN
ABO/RH(D): A POS
Antibody Screen: NEGATIVE
Unit division: 0
Unit division: 0

## 2013-12-22 LAB — COMPREHENSIVE METABOLIC PANEL
ALBUMIN: 3.3 g/dL — AB (ref 3.5–5.2)
ALT: 105 U/L — ABNORMAL HIGH (ref 0–35)
AST: 49 U/L — AB (ref 0–37)
Alkaline Phosphatase: 49 U/L (ref 39–117)
BILIRUBIN TOTAL: 0.7 mg/dL (ref 0.3–1.2)
BUN: 26 mg/dL — ABNORMAL HIGH (ref 6–23)
CALCIUM: 8.9 mg/dL (ref 8.4–10.5)
CO2: 22 mEq/L (ref 19–32)
CREATININE: 0.87 mg/dL (ref 0.50–1.10)
Chloride: 107 mEq/L (ref 96–112)
GFR calc Af Amer: 72 mL/min — ABNORMAL LOW (ref >90)
GFR calc non Af Amer: 62 mL/min — ABNORMAL LOW (ref >90)
Glucose, Bld: 115 mg/dL — ABNORMAL HIGH (ref 70–99)
Potassium: 4 mEq/L (ref 3.7–5.3)
Sodium: 141 mEq/L (ref 137–147)
TOTAL PROTEIN: 5.1 g/dL — AB (ref 6.0–8.3)

## 2013-12-22 LAB — CHROMOSOME ANALYSIS, BONE MARROW

## 2013-12-22 LAB — URIC ACID: Uric Acid, Serum: 0.6 mg/dL — ABNORMAL LOW (ref 2.4–7.0)

## 2013-12-22 LAB — LACTATE DEHYDROGENASE: LDH: 248 U/L (ref 94–250)

## 2013-12-22 LAB — TISSUE HYBRIDIZATION (BONE MARROW)-NCBH

## 2013-12-22 MED ORDER — HEPARIN SOD (PORK) LOCK FLUSH 100 UNIT/ML IV SOLN
500.0000 [IU] | Freq: Once | INTRAVENOUS | Status: DC
  Filled 2013-12-22: qty 5

## 2013-12-22 NOTE — Progress Notes (Signed)
I spent 35 minutes on discharge today

## 2013-12-26 ENCOUNTER — Ambulatory Visit: Payer: Medicare Other | Admitting: Hematology and Oncology

## 2013-12-26 ENCOUNTER — Ambulatory Visit: Payer: Medicare Other

## 2013-12-26 ENCOUNTER — Other Ambulatory Visit: Payer: Medicare Other

## 2013-12-26 ENCOUNTER — Other Ambulatory Visit: Payer: Self-pay | Admitting: Hematology and Oncology

## 2013-12-26 DIAGNOSIS — C9110 Chronic lymphocytic leukemia of B-cell type not having achieved remission: Secondary | ICD-10-CM

## 2013-12-27 ENCOUNTER — Telehealth: Payer: Self-pay | Admitting: *Deleted

## 2013-12-27 ENCOUNTER — Other Ambulatory Visit (HOSPITAL_BASED_OUTPATIENT_CLINIC_OR_DEPARTMENT_OTHER): Payer: Medicare Other

## 2013-12-27 ENCOUNTER — Other Ambulatory Visit: Payer: Self-pay | Admitting: Hematology and Oncology

## 2013-12-27 ENCOUNTER — Ambulatory Visit (HOSPITAL_BASED_OUTPATIENT_CLINIC_OR_DEPARTMENT_OTHER): Payer: Medicare Other

## 2013-12-27 ENCOUNTER — Ambulatory Visit (HOSPITAL_BASED_OUTPATIENT_CLINIC_OR_DEPARTMENT_OTHER): Payer: Medicare Other | Admitting: Hematology and Oncology

## 2013-12-27 ENCOUNTER — Encounter: Payer: Self-pay | Admitting: Hematology and Oncology

## 2013-12-27 ENCOUNTER — Telehealth: Payer: Self-pay | Admitting: Hematology and Oncology

## 2013-12-27 VITALS — BP 120/50 | HR 79 | Temp 98.4°F | Resp 18

## 2013-12-27 VITALS — BP 140/53 | HR 78 | Temp 98.0°F | Resp 19 | Ht 60.0 in | Wt 113.9 lb

## 2013-12-27 DIAGNOSIS — D6959 Other secondary thrombocytopenia: Secondary | ICD-10-CM

## 2013-12-27 DIAGNOSIS — C9110 Chronic lymphocytic leukemia of B-cell type not having achieved remission: Secondary | ICD-10-CM

## 2013-12-27 DIAGNOSIS — D649 Anemia, unspecified: Secondary | ICD-10-CM

## 2013-12-27 DIAGNOSIS — D696 Thrombocytopenia, unspecified: Secondary | ICD-10-CM

## 2013-12-27 DIAGNOSIS — Z5112 Encounter for antineoplastic immunotherapy: Secondary | ICD-10-CM

## 2013-12-27 LAB — CBC & DIFF AND RETIC
BASO%: 0.3 % (ref 0.0–2.0)
BASOS ABS: 0 10*3/uL (ref 0.0–0.1)
EOS ABS: 0 10*3/uL (ref 0.0–0.5)
EOS%: 0.8 % (ref 0.0–7.0)
HCT: 32.4 % — ABNORMAL LOW (ref 34.8–46.6)
HGB: 10.9 g/dL — ABNORMAL LOW (ref 11.6–15.9)
Immature Retic Fract: 8.9 % (ref 1.60–10.00)
LYMPH%: 38.7 % (ref 14.0–49.7)
MCH: 31.6 pg (ref 25.1–34.0)
MCHC: 33.6 g/dL (ref 31.5–36.0)
MCV: 93.9 fL (ref 79.5–101.0)
MONO#: 0.4 10*3/uL (ref 0.1–0.9)
MONO%: 10.8 % (ref 0.0–14.0)
NEUT%: 49.4 % (ref 38.4–76.8)
NEUTROS ABS: 1.8 10*3/uL (ref 1.5–6.5)
PLATELETS: 31 10*3/uL — AB (ref 145–400)
RBC: 3.45 10*6/uL — AB (ref 3.70–5.45)
RDW: 16.9 % — ABNORMAL HIGH (ref 11.2–14.5)
RETIC %: 1.08 % (ref 0.70–2.10)
Retic Ct Abs: 37.26 10*3/uL (ref 33.70–90.70)
WBC: 3.6 10*3/uL — AB (ref 3.9–10.3)
lymph#: 1.4 10*3/uL (ref 0.9–3.3)

## 2013-12-27 LAB — COMPREHENSIVE METABOLIC PANEL (CC13)
ALBUMIN: 4 g/dL (ref 3.5–5.0)
ALT: 36 U/L (ref 0–55)
AST: 16 U/L (ref 5–34)
Alkaline Phosphatase: 43 U/L (ref 40–150)
Anion Gap: 8 mEq/L (ref 3–11)
BUN: 24.6 mg/dL (ref 7.0–26.0)
CALCIUM: 10.1 mg/dL (ref 8.4–10.4)
CHLORIDE: 104 meq/L (ref 98–109)
CO2: 26 mEq/L (ref 22–29)
Creatinine: 0.8 mg/dL (ref 0.6–1.1)
GLUCOSE: 86 mg/dL (ref 70–140)
POTASSIUM: 5.1 meq/L (ref 3.5–5.1)
SODIUM: 138 meq/L (ref 136–145)
TOTAL PROTEIN: 5.8 g/dL — AB (ref 6.4–8.3)
Total Bilirubin: 1.36 mg/dL — ABNORMAL HIGH (ref 0.20–1.20)

## 2013-12-27 LAB — LACTATE DEHYDROGENASE (CC13): LDH: 187 U/L (ref 125–245)

## 2013-12-27 LAB — HOLD TUBE, BLOOD BANK

## 2013-12-27 LAB — URIC ACID (CC13): Uric Acid, Serum: 3.5 mg/dl (ref 2.6–7.4)

## 2013-12-27 MED ORDER — ACETAMINOPHEN 325 MG PO TABS
ORAL_TABLET | ORAL | Status: AC
  Filled 2013-12-27: qty 2

## 2013-12-27 MED ORDER — SODIUM CHLORIDE 0.9 % IJ SOLN
10.0000 mL | INTRAMUSCULAR | Status: DC | PRN
  Administered 2013-12-27: 10 mL
  Filled 2013-12-27: qty 10

## 2013-12-27 MED ORDER — DEXAMETHASONE SODIUM PHOSPHATE 20 MG/5ML IJ SOLN
20.0000 mg | Freq: Once | INTRAMUSCULAR | Status: AC
  Administered 2013-12-27: 20 mg via INTRAVENOUS

## 2013-12-27 MED ORDER — HEPARIN SOD (PORK) LOCK FLUSH 100 UNIT/ML IV SOLN
500.0000 [IU] | Freq: Once | INTRAVENOUS | Status: AC | PRN
  Administered 2013-12-27: 500 [IU]
  Filled 2013-12-27: qty 5

## 2013-12-27 MED ORDER — ACETAMINOPHEN 325 MG PO TABS
650.0000 mg | ORAL_TABLET | Freq: Once | ORAL | Status: AC
  Administered 2013-12-27: 650 mg via ORAL

## 2013-12-27 MED ORDER — DIPHENHYDRAMINE HCL 50 MG/ML IJ SOLN
50.0000 mg | Freq: Once | INTRAMUSCULAR | Status: AC
Start: 2013-12-27 — End: 2013-12-27
  Administered 2013-12-27: 50 mg via INTRAVENOUS

## 2013-12-27 MED ORDER — DIPHENHYDRAMINE HCL 50 MG/ML IJ SOLN
INTRAMUSCULAR | Status: AC
  Filled 2013-12-27: qty 1

## 2013-12-27 MED ORDER — SODIUM CHLORIDE 0.9 % IV SOLN
Freq: Once | INTRAVENOUS | Status: AC
  Administered 2013-12-27: 12:00:00 via INTRAVENOUS

## 2013-12-27 MED ORDER — DEXAMETHASONE SODIUM PHOSPHATE 20 MG/5ML IJ SOLN
INTRAMUSCULAR | Status: AC
  Filled 2013-12-27: qty 5

## 2013-12-27 MED ORDER — SODIUM CHLORIDE 0.9 % IV SOLN
1000.0000 mg | Freq: Once | INTRAVENOUS | Status: AC
  Administered 2013-12-27: 1000 mg via INTRAVENOUS
  Filled 2013-12-27: qty 40

## 2013-12-27 NOTE — Progress Notes (Signed)
[facility] OFFICE PROGRESS NOTE  Patient Care Team: Precious Reel, MD as PCP - General (Internal Medicine) Heath Lark, MD as Consulting Physician (Hematology and Oncology)  DIAGNOSIS: Stage IV CLL  SUMMARY OF ONCOLOGIC HISTORY: Oncology History   CLL stage IV, deletion 13q     CLL (chronic lymphocytic leukemia)   12/12/2013 Bone Marrow Biopsy Bone marrow biopsy show significant involvement of CLL   12/13/2013 Procedure Patient had placement of a port   12/14/2013 Imaging CT scan of the chest, abdomen and pelvis showed significant lymphadenopathy and splenomegaly   12/20/2013 - 12/22/2013 Hospital Admission The patient was admitted to the hospital to begin cycle 1 day 1 of chemotherapy due to risk of infusion reaction. She tolerated treatment well without major side effects.   12/20/2013 -  Chemotherapy started on cycle 1 of Obinutuzumab, chlorambucil and prednisone    INTERVAL HISTORY: Angelica Sullivan 80 y.o. female returns for further followup. She is doing very well. She has great appetite and energy from prednisone. The patient denies any recent signs or symptoms of bleeding such as spontaneous epistaxis, hematuria or hematochezia.   I have reviewed the past medical history, past surgical history, social history and family history with the patient and they are unchanged from previous note.  ALLERGIES:  is allergic to bactrim.  MEDICATIONS:  Current Outpatient Prescriptions  Medication Sig Dispense Refill  . acyclovir (ZOVIRAX) 400 MG tablet Take 400 mg by mouth daily.      Marland Kitchen allopurinol (ZYLOPRIM) 300 MG tablet Take 1 tablet (300 mg total) by mouth daily.  30 tablet  0  . atorvastatin (LIPITOR) 20 MG tablet Take 20 mg by mouth every evening.       . calcium citrate-vitamin D (CITRACAL+D) 315-200 MG-UNIT per tablet Take 1 tablet by mouth daily.       . Cholecalciferol (VITAMIN D) 400 UNITS capsule Take 400 Units by mouth daily.      . diphenhydrAMINE (BENADRYL) 25 mg  capsule Take 25 mg by mouth at bedtime as needed for allergies.      . Echinacea 400 MG CAPS Take 1 capsule by mouth 2 (two) times daily.        Marland Kitchen ESTRACE VAGINAL 0.1 MG/GM vaginal cream Place 1 Applicatorful vaginally 2 (two) times a week.       . fish oil-omega-3 fatty acids 1000 MG capsule Take 2 g by mouth at bedtime.       Marland Kitchen glucosamine-chondroitin 500-400 MG tablet Take 1 tablet by mouth daily.        Marland Kitchen lidocaine-prilocaine (EMLA) cream Apply 1 application topically as needed (port access.).      Marland Kitchen loratadine (CLARITIN) 10 MG tablet Take 10 mg by mouth daily.        . metoprolol succinate (TOPROL-XL) 25 MG 24 hr tablet Take 12.5 mg by mouth every morning.       . montelukast (SINGULAIR) 10 MG tablet Take 10 mg by mouth daily.       . Multiple Vitamin (MULTIVITAMIN) tablet Take 1 tablet by mouth daily.        . ondansetron (ZOFRAN) 8 MG tablet Take 1 tablet (8 mg total) by mouth every 8 (eight) hours as needed for nausea.  30 tablet  3  . polyvinyl alcohol (ARTIFICIAL TEARS) 1.4 % ophthalmic solution Place 1 drop into both eyes 3 (three) times daily as needed for dry eyes.       . predniSONE (DELTASONE) 20 MG tablet Take  20 mg by mouth daily with breakfast.      . sodium chloride (OCEAN) 0.65 % SOLN nasal spray Place 1 spray into both nostrils 2 (two) times daily.       Marland Kitchen tiZANidine (ZANAFLEX) 4 MG tablet Take 4 mg by mouth 3 (three) times a week.      . zolpidem (AMBIEN) 5 MG tablet Take 5 mg by mouth at bedtime as needed for sleep.        No current facility-administered medications for this visit.   Facility-Administered Medications Ordered in Other Visits  Medication Dose Route Frequency Provider Last Rate Last Dose  . 0.9 %  sodium chloride infusion   Intravenous Once Heath Lark, MD      . heparin lock flush 100 unit/mL  500 Units Intracatheter Once PRN Heath Lark, MD      . obinutuzumab (GAZYVA) 1,000 mg in sodium chloride 0.9 % 250 mL chemo infusion  1,000 mg Intravenous Once Heath Lark, MD      . sodium chloride 0.9 % injection 10 mL  10 mL Intracatheter PRN Heath Lark, MD        REVIEW OF SYSTEMS:   Constitutional: Denies fevers, chills or abnormal weight loss Eyes: Denies blurriness of vision Ears, nose, mouth, throat, and face: Denies mucositis or sore throat Respiratory: Denies cough, dyspnea or wheezes Cardiovascular: Denies palpitation, chest discomfort or lower extremity swelling Gastrointestinal:  Denies nausea, heartburn or change in bowel habits Skin: Denies abnormal skin rashes Lymphatics: Denies new lymphadenopathy or easy bruising Neurological:Denies numbness, tingling or new weaknesses Behavioral/Psych: Mood is stable, no new changes  All other systems were reviewed with the patient and are negative.  PHYSICAL EXAMINATION: ECOG PERFORMANCE STATUS: 1 - Symptomatic but completely ambulatory  Filed Vitals:   12/27/13 1104  BP: 140/53  Pulse: 78  Temp: 98 F (36.7 C)  Resp: 19   Filed Weights   12/27/13 1104  Weight: 113 lb 14.4 oz (51.665 kg)    GENERAL:alert, no distress and comfortable SKIN: skin color, texture, turgor are normal, no rashes or significant lesions EYES: normal, Conjunctiva are pink and non-injected, sclera clear OROPHARYNX:no exudate, no erythema and lips, buccal mucosa, and tongue normal  NECK: supple, thyroid normal size, non-tender, without nodularity LYMPH:  no palpable lymphadenopathy in the cervical, axillary or inguinal LUNGS: clear to auscultation and percussion with normal breathing effort HEART: regular rate & rhythm and no murmurs and no lower extremity edema ABDOMEN:abdomen soft, non-tender and normal bowel sounds Musculoskeletal:no cyanosis of digits and no clubbing  NEURO: alert & oriented x 3 with fluent speech, no focal motor/sensory deficits  LABORATORY DATA:  I have reviewed the data as listed    Component Value Date/Time   NA 138 12/27/2013 1036   NA 141 12/22/2013 0530   K 5.1 12/27/2013 1036    K 4.0 12/22/2013 0530   CL 107 12/22/2013 0530   CL 99 04/08/2013 0922   CO2 26 12/27/2013 1036   CO2 22 12/22/2013 0530   GLUCOSE 86 12/27/2013 1036   GLUCOSE 115* 12/22/2013 0530   GLUCOSE 92 04/08/2013 0922   BUN 24.6 12/27/2013 1036   BUN 26* 12/22/2013 0530   CREATININE 0.8 12/27/2013 1036   CREATININE 0.87 12/22/2013 0530   CALCIUM 10.1 12/27/2013 1036   CALCIUM 8.9 12/22/2013 0530   PROT 5.8* 12/27/2013 1036   PROT 5.1* 12/22/2013 0530   ALBUMIN 4.0 12/27/2013 1036   ALBUMIN 3.3* 12/22/2013 0530   AST 16 12/27/2013 1036  AST 49* 12/22/2013 0530   ALT 36 12/27/2013 1036   ALT 105* 12/22/2013 0530   ALKPHOS 43 12/27/2013 1036   ALKPHOS 49 12/22/2013 0530   BILITOT 1.36* 12/27/2013 1036   BILITOT 0.7 12/22/2013 0530   GFRNONAA 62* 12/22/2013 0530   GFRAA 72* 12/22/2013 0530    No results found for this basename: SPEP, UPEP,  kappa and lambda light chains    Lab Results  Component Value Date   WBC 3.6* 12/27/2013   NEUTROABS 1.8 12/27/2013   HGB 10.9* 12/27/2013   HCT 32.4* 12/27/2013   MCV 93.9 12/27/2013   PLT 31* 12/27/2013      Chemistry      Component Value Date/Time   NA 138 12/27/2013 1036   NA 141 12/22/2013 0530   K 5.1 12/27/2013 1036   K 4.0 12/22/2013 0530   CL 107 12/22/2013 0530   CL 99 04/08/2013 0922   CO2 26 12/27/2013 1036   CO2 22 12/22/2013 0530   BUN 24.6 12/27/2013 1036   BUN 26* 12/22/2013 0530   CREATININE 0.8 12/27/2013 1036   CREATININE 0.87 12/22/2013 0530      Component Value Date/Time   CALCIUM 10.1 12/27/2013 1036   CALCIUM 8.9 12/22/2013 0530   ALKPHOS 43 12/27/2013 1036   ALKPHOS 49 12/22/2013 0530   AST 16 12/27/2013 1036   AST 49* 12/22/2013 0530   ALT 36 12/27/2013 1036   ALT 105* 12/22/2013 0530   BILITOT 1.36* 12/27/2013 1036   BILITOT 0.7 12/22/2013 0530     ASSESSMENT & PLAN:  #1 CLL We will proceed with treatment without dosage adjustment. I told her to hold chlorambucil. Overall she is responding well to treatment. I recommend she start taper  prednisone to 10 mg daily until I see her next week #2 anemia This is likely anemia of neoplastic disease. She is doing much better and with improvement of her hemoglobin. The patient denies recent history of bleeding such as epistaxis, hematuria or hematochezia. She is asymptomatic from the anemia. We will observe for now.  She does not require transfusion now.  #3 thrombocytopenia This is due to her underlying disease. It is improving. We will proceed without dose adjustment or delay #4 tumor lysis prophylaxis She will continue allopurinol until she runs offer prescription #5 antimicrobial prophylaxis She will continue acyclovir All questions were answered. The patient knows to call the clinic with any problems, questions or concerns. No barriers to learning was detected. I spent 25 minutes counseling the patient face to face. The total time spent in the appointment was 40 minutes and more than 50% was on counseling and review of test results     [facility], [place], MD 12/27/2013 12:05 PM

## 2013-12-27 NOTE — Telephone Encounter (Signed)
Spoke w/ husband,  He states they are getting ready to leave the house now.  Encouraged to drive slow and be careful in this weather.  We will see pt when she gets here.  He verbalized understanding.

## 2013-12-27 NOTE — Telephone Encounter (Signed)
gv and printed appt sched and avs for pt for March...sed added tx  °

## 2013-12-27 NOTE — Progress Notes (Signed)
[place]ze started at 30 ml/hr x 15 ml. VS/patient stable 1255- infusion increased to 60 ml/hr x 30 ml, VS/patient stable 1325- infusion increased to 87 ml/hr x 43 ml, VS/patient stable 1355- infusion increased to 116 ml/hr x 58 ml, VS/patient stable. 1555- infusion completed without complications or side effects.

## 2013-12-27 NOTE — Patient Instructions (Signed)
[facility] Discharge Instructions for Patients Receiving Chemotherapy  Today you received the following chemotherapy agents: Gayzva  To help prevent nausea and vomiting after your treatment, we encourage you to take your nausea medication: Zofran 8 mg every 8 hrs as needed.   If you develop nausea and vomiting that is not controlled by your nausea medication, call the clinic.   BELOW ARE SYMPTOMS THAT SHOULD BE REPORTED IMMEDIATELY:  *FEVER GREATER THAN 100.5 F  *CHILLS WITH OR WITHOUT FEVER  NAUSEA AND VOMITING THAT IS NOT CONTROLLED WITH YOUR NAUSEA MEDICATION  *UNUSUAL SHORTNESS OF BREATH  *UNUSUAL BRUISING OR BLEEDING  TENDERNESS IN MOUTH AND THROAT WITH OR WITHOUT PRESENCE OF ULCERS  *URINARY PROBLEMS  *BOWEL PROBLEMS  UNUSUAL RASH Items with * indicate a potential emergency and should be followed up as soon as possible.  Feel free to call the clinic you have any questions or concerns. The clinic phone number is [phone].

## 2013-12-28 ENCOUNTER — Telehealth: Payer: Self-pay | Admitting: *Deleted

## 2013-12-28 NOTE — Telephone Encounter (Signed)
Called pt at home for post chemo follow up call.  Pt stated she was doing well.  Denied nausea/vomiting, stated fair appetite and drinking lots of fluids and supplements suggested by Raford Pitcher, dietitian.  Stated bowel and bladder function fine.  Pt aware of next return appts.  Pt understood to call office for any problems.

## 2013-12-30 ENCOUNTER — Encounter: Payer: Self-pay | Admitting: Hematology and Oncology

## 2014-01-02 ENCOUNTER — Ambulatory Visit: Payer: Medicare Other

## 2014-01-02 ENCOUNTER — Other Ambulatory Visit: Payer: Medicare Other

## 2014-01-02 ENCOUNTER — Encounter (HOSPITAL_COMMUNITY): Payer: Self-pay

## 2014-01-03 ENCOUNTER — Ambulatory Visit (HOSPITAL_BASED_OUTPATIENT_CLINIC_OR_DEPARTMENT_OTHER): Payer: Medicare Other

## 2014-01-03 ENCOUNTER — Other Ambulatory Visit (HOSPITAL_BASED_OUTPATIENT_CLINIC_OR_DEPARTMENT_OTHER): Payer: Medicare Other

## 2014-01-03 ENCOUNTER — Other Ambulatory Visit: Payer: Self-pay | Admitting: *Deleted

## 2014-01-03 ENCOUNTER — Ambulatory Visit (HOSPITAL_BASED_OUTPATIENT_CLINIC_OR_DEPARTMENT_OTHER): Payer: Medicare Other | Admitting: Hematology and Oncology

## 2014-01-03 ENCOUNTER — Other Ambulatory Visit: Payer: Medicare Other

## 2014-01-03 VITALS — BP 130/39 | HR 72 | Temp 97.3°F | Resp 18 | Ht 60.0 in | Wt 112.9 lb

## 2014-01-03 VITALS — BP 128/45 | HR 66 | Temp 98.2°F | Resp 16

## 2014-01-03 DIAGNOSIS — C9110 Chronic lymphocytic leukemia of B-cell type not having achieved remission: Secondary | ICD-10-CM

## 2014-01-03 DIAGNOSIS — Z5112 Encounter for antineoplastic immunotherapy: Secondary | ICD-10-CM

## 2014-01-03 DIAGNOSIS — D696 Thrombocytopenia, unspecified: Secondary | ICD-10-CM

## 2014-01-03 DIAGNOSIS — D649 Anemia, unspecified: Secondary | ICD-10-CM

## 2014-01-03 LAB — COMPREHENSIVE METABOLIC PANEL (CC13)
ALBUMIN: 3.7 g/dL (ref 3.5–5.0)
ALT: 19 U/L (ref 0–55)
ANION GAP: 6 meq/L (ref 3–11)
AST: 14 U/L (ref 5–34)
Alkaline Phosphatase: 44 U/L (ref 40–150)
BUN: 23.9 mg/dL (ref 7.0–26.0)
CALCIUM: 9.6 mg/dL (ref 8.4–10.4)
CHLORIDE: 104 meq/L (ref 98–109)
CO2: 26 meq/L (ref 22–29)
Creatinine: 0.9 mg/dL (ref 0.6–1.1)
GLUCOSE: 96 mg/dL (ref 70–140)
Potassium: 4.1 mEq/L (ref 3.5–5.1)
Sodium: 136 mEq/L (ref 136–145)
Total Bilirubin: 1.12 mg/dL (ref 0.20–1.20)
Total Protein: 5.5 g/dL — ABNORMAL LOW (ref 6.4–8.3)

## 2014-01-03 LAB — CBC & DIFF AND RETIC
BASO%: 1.2 % (ref 0.0–2.0)
Basophils Absolute: 0 10*3/uL (ref 0.0–0.1)
EOS ABS: 0 10*3/uL (ref 0.0–0.5)
EOS%: 1.6 % (ref 0.0–7.0)
HEMATOCRIT: 29.5 % — AB (ref 34.8–46.6)
HEMOGLOBIN: 9.7 g/dL — AB (ref 11.6–15.9)
Immature Retic Fract: 19.5 % — ABNORMAL HIGH (ref 1.60–10.00)
LYMPH%: 35 % (ref 14.0–49.7)
MCH: 31.1 pg (ref 25.1–34.0)
MCHC: 32.9 g/dL (ref 31.5–36.0)
MCV: 94.6 fL (ref 79.5–101.0)
MONO#: 0.3 10*3/uL (ref 0.1–0.9)
MONO%: 14 % (ref 0.0–14.0)
NEUT#: 1.2 10*3/uL — ABNORMAL LOW (ref 1.5–6.5)
NEUT%: 48.2 % (ref 38.4–76.8)
PLATELETS: 40 10*3/uL — AB (ref 145–400)
RBC: 3.12 10*6/uL — ABNORMAL LOW (ref 3.70–5.45)
RDW: 17.1 % — ABNORMAL HIGH (ref 11.2–14.5)
RETIC CT ABS: 52.42 10*3/uL (ref 33.70–90.70)
Retic %: 1.68 % (ref 0.70–2.10)
WBC: 2.4 10*3/uL — ABNORMAL LOW (ref 3.9–10.3)
lymph#: 0.9 10*3/uL (ref 0.9–3.3)

## 2014-01-03 MED ORDER — ACETAMINOPHEN 325 MG PO TABS
650.0000 mg | ORAL_TABLET | Freq: Once | ORAL | Status: AC
Start: 2014-01-03 — End: 2014-01-03
  Administered 2014-01-03: 650 mg via ORAL

## 2014-01-03 MED ORDER — DEXAMETHASONE SODIUM PHOSPHATE 20 MG/5ML IJ SOLN
INTRAMUSCULAR | Status: AC
  Filled 2014-01-03: qty 5

## 2014-01-03 MED ORDER — DEXAMETHASONE SODIUM PHOSPHATE 20 MG/5ML IJ SOLN
20.0000 mg | Freq: Once | INTRAMUSCULAR | Status: AC
  Administered 2014-01-03: 20 mg via INTRAVENOUS

## 2014-01-03 MED ORDER — SODIUM CHLORIDE 0.9 % IV SOLN
Freq: Once | INTRAVENOUS | Status: AC
  Administered 2014-01-03: 13:00:00 via INTRAVENOUS

## 2014-01-03 MED ORDER — DIPHENHYDRAMINE HCL 50 MG/ML IJ SOLN
50.0000 mg | Freq: Once | INTRAMUSCULAR | Status: AC
Start: 2014-01-03 — End: 2014-01-03
  Administered 2014-01-03: 50 mg via INTRAVENOUS

## 2014-01-03 MED ORDER — SODIUM CHLORIDE 0.9 % IV SOLN
1000.0000 mg | Freq: Once | INTRAVENOUS | Status: AC
  Administered 2014-01-03: 1000 mg via INTRAVENOUS
  Filled 2014-01-03: qty 40

## 2014-01-03 MED ORDER — ACETAMINOPHEN 325 MG PO TABS
ORAL_TABLET | ORAL | Status: AC
  Filled 2014-01-03: qty 2

## 2014-01-03 MED ORDER — HEPARIN SOD (PORK) LOCK FLUSH 100 UNIT/ML IV SOLN
500.0000 [IU] | Freq: Once | INTRAVENOUS | Status: AC | PRN
  Administered 2014-01-03: 500 [IU]
  Filled 2014-01-03: qty 5

## 2014-01-03 MED ORDER — SODIUM CHLORIDE 0.9 % IJ SOLN
10.0000 mL | INTRAMUSCULAR | Status: DC | PRN
  Administered 2014-01-03: 10 mL
  Filled 2014-01-03: qty 10

## 2014-01-03 MED ORDER — DIPHENHYDRAMINE HCL 25 MG PO CAPS
ORAL_CAPSULE | ORAL | Status: AC
  Filled 2014-01-03: qty 2

## 2014-01-03 MED ORDER — DIPHENHYDRAMINE HCL 50 MG/ML IJ SOLN
INTRAMUSCULAR | Status: AC
  Filled 2014-01-03: qty 1

## 2014-01-03 NOTE — Patient Instructions (Signed)
[facility] Discharge Instructions for Patients Receiving Chemotherapy  Today you received the following chemotherapy agents gazyva  To help prevent nausea and vomiting after your treatment, we encourage you to take your nausea medication as directed    If you develop nausea and vomiting that is not controlled by your nausea medication, call the clinic.   BELOW ARE SYMPTOMS THAT SHOULD BE REPORTED IMMEDIATELY:  *FEVER GREATER THAN 100.5 F  *CHILLS WITH OR WITHOUT FEVER  NAUSEA AND VOMITING THAT IS NOT CONTROLLED WITH YOUR NAUSEA MEDICATION  *UNUSUAL SHORTNESS OF BREATH  *UNUSUAL BRUISING OR BLEEDING  TENDERNESS IN MOUTH AND THROAT WITH OR WITHOUT PRESENCE OF ULCERS  *URINARY PROBLEMS  *BOWEL PROBLEMS  UNUSUAL RASH Items with * indicate a potential emergency and should be followed up as soon as possible.  Feel free to call the clinic you have any questions or concerns. The clinic phone number is [phone].  

## 2014-01-03 NOTE — Progress Notes (Signed)
[place] OFFICE PROGRESS NOTE  Patient Care Team: Precious Reel, MD as PCP - General (Internal Medicine) Heath Lark, MD as Consulting Physician (Hematology and Oncology)  DIAGNOSIS: Stage IV CLL, seen prior to cycle 1 week 3 of treatment  SUMMARY OF ONCOLOGIC HISTORY: Oncology History   CLL stage IV, deletion 13q     CLL (chronic lymphocytic leukemia)   12/12/2013 Bone Marrow Biopsy Bone marrow biopsy show significant involvement of CLL   12/13/2013 Procedure Patient had placement of a port   12/14/2013 Imaging CT scan of the chest, abdomen and pelvis showed significant lymphadenopathy and splenomegaly   12/20/2013 - 12/22/2013 Hospital Admission The patient was admitted to the hospital to begin cycle 1 day 1 of chemotherapy due to risk of infusion reaction. She tolerated treatment well without major side effects.   12/20/2013 -  Chemotherapy started on cycle 1 of Obinutuzumab, chlorambucil and prednisone    INTERVAL HISTORY: Angelica Sullivan 80 y.o. female returns for further followup. She is doing well. She has excellent energy level. She denies any recent fever, chills, night sweats or abnormal weight loss The patient denies any recent signs or symptoms of bleeding such as spontaneous epistaxis, hematuria or hematochezia.  I have reviewed the past medical history, past surgical history, social history and family history with the patient and they are unchanged from previous note.  ALLERGIES:  is allergic to bactrim.  MEDICATIONS:  Current Outpatient Prescriptions  Medication Sig Dispense Refill  . acyclovir (ZOVIRAX) 400 MG tablet Take 400 mg by mouth daily.      Marland Kitchen allopurinol (ZYLOPRIM) 300 MG tablet Take 1 tablet (300 mg total) by mouth daily.  30 tablet  0  . atorvastatin (LIPITOR) 20 MG tablet Take 20 mg by mouth every evening.       . calcium citrate-vitamin D (CITRACAL+D) 315-200 MG-UNIT per tablet Take 1 tablet by mouth daily.       . Cholecalciferol (VITAMIN  D) 400 UNITS capsule Take 400 Units by mouth daily.      . diphenhydrAMINE (BENADRYL) 25 mg capsule Take 25 mg by mouth at bedtime as needed for allergies.      . Echinacea 400 MG CAPS Take 1 capsule by mouth 2 (two) times daily.        Marland Kitchen ESTRACE VAGINAL 0.1 MG/GM vaginal cream Place 1 Applicatorful vaginally 2 (two) times a week.       . fish oil-omega-3 fatty acids 1000 MG capsule Take 2 g by mouth at bedtime.       . folic acid (FOLVITE) 093 MCG tablet Take 800 mcg by mouth daily.      Marland Kitchen glucosamine-chondroitin 500-400 MG tablet Take 1 tablet by mouth daily.        Marland Kitchen lidocaine-prilocaine (EMLA) cream Apply 1 application topically as needed (port access.).      Marland Kitchen loratadine (CLARITIN) 10 MG tablet Take 10 mg by mouth daily.        . metoprolol succinate (TOPROL-XL) 25 MG 24 hr tablet Take 12.5 mg by mouth every morning.       . montelukast (SINGULAIR) 10 MG tablet Take 10 mg by mouth daily.       . Multiple Vitamin (MULTIVITAMIN) tablet Take 1 tablet by mouth daily.        . ondansetron (ZOFRAN) 8 MG tablet Take 1 tablet (8 mg total) by mouth every 8 (eight) hours as needed for nausea.  30 tablet  3  . polyvinyl alcohol (  ARTIFICIAL TEARS) 1.4 % ophthalmic solution Place 1 drop into both eyes 3 (three) times daily as needed for dry eyes.       . predniSONE (DELTASONE) 20 MG tablet Take 10 mg by mouth daily with breakfast.       . sodium chloride (OCEAN) 0.65 % SOLN nasal spray Place 1 spray into both nostrils 2 (two) times daily.       Marland Kitchen tiZANidine (ZANAFLEX) 4 MG tablet Take 4 mg by mouth 3 (three) times a week.      . zolpidem (AMBIEN) 5 MG tablet Take 5 mg by mouth at bedtime as needed for sleep.        No current facility-administered medications for this visit.    REVIEW OF SYSTEMS:   Constitutional: Denies fevers, chills or abnormal weight loss Eyes: Denies blurriness of vision Ears, nose, mouth, throat, and face: Denies mucositis or sore throat Respiratory: Denies cough, dyspnea or  wheezes Cardiovascular: Denies palpitation, chest discomfort or lower extremity swelling Gastrointestinal:  Denies nausea, heartburn or change in bowel habits Skin: Denies abnormal skin rashes Lymphatics: Denies new lymphadenopathy or easy bruising Neurological:Denies numbness, tingling or new weaknesses Behavioral/Psych: Mood is stable, no new changes  All other systems were reviewed with the patient and are negative.  PHYSICAL EXAMINATION: ECOG PERFORMANCE STATUS: 0 - Asymptomatic  Filed Vitals:   01/03/14 1048  BP: 130/39  Pulse: 72  Temp: 97.3 F (36.3 C)  Resp: 18   Filed Weights   01/03/14 1048  Weight: 112 lb 14.4 oz (51.211 kg)    GENERAL:alert, no distress and comfortable SKIN: skin color, texture, turgor are normal, no rashes or significant lesions EYES: normal, Conjunctiva are pink and non-injected, sclera clear OROPHARYNX:no exudate, no erythema and lips, buccal mucosa, and tongue normal  NECK: supple, thyroid normal size, non-tender, without nodularity LYMPH:  no palpable lymphadenopathy in the cervical, axillary or inguinal LUNGS: clear to auscultation and percussion with normal breathing effort HEART: regular rate & rhythm and no murmurs and no lower extremity edema ABDOMEN:abdomen soft, non-tender and normal bowel sounds Musculoskeletal:no cyanosis of digits and no clubbing  NEURO: alert & oriented x 3 with fluent speech, no focal motor/sensory deficits  LABORATORY DATA:  I have reviewed the data as listed    Component Value Date/Time   NA 136 01/03/2014 1035   NA 141 12/22/2013 0530   K 4.1 01/03/2014 1035   K 4.0 12/22/2013 0530   CL 107 12/22/2013 0530   CL 99 04/08/2013 0922   CO2 26 01/03/2014 1035   CO2 22 12/22/2013 0530   GLUCOSE 96 01/03/2014 1035   GLUCOSE 115* 12/22/2013 0530   GLUCOSE 92 04/08/2013 0922   BUN 23.9 01/03/2014 1035   BUN 26* 12/22/2013 0530   CREATININE 0.9 01/03/2014 1035   CREATININE 0.87 12/22/2013 0530   CALCIUM 9.6 01/03/2014 1035    CALCIUM 8.9 12/22/2013 0530   PROT 5.5* 01/03/2014 1035   PROT 5.1* 12/22/2013 0530   ALBUMIN 3.7 01/03/2014 1035   ALBUMIN 3.3* 12/22/2013 0530   AST 14 01/03/2014 1035   AST 49* 12/22/2013 0530   ALT 19 01/03/2014 1035   ALT 105* 12/22/2013 0530   ALKPHOS 44 01/03/2014 1035   ALKPHOS 49 12/22/2013 0530   BILITOT 1.12 01/03/2014 1035   BILITOT 0.7 12/22/2013 0530   GFRNONAA 62* 12/22/2013 0530   GFRAA 72* 12/22/2013 0530    No results found for this basename: SPEP,  UPEP,   kappa and lambda light chains  Lab Results  Component Value Date   WBC 2.4* 01/03/2014   NEUTROABS 1.2* 01/03/2014   HGB 9.7* 01/03/2014   HCT 29.5* 01/03/2014   MCV 94.6 01/03/2014   PLT 40* 01/03/2014      Chemistry      Component Value Date/Time   NA 136 01/03/2014 1035   NA 141 12/22/2013 0530   K 4.1 01/03/2014 1035   K 4.0 12/22/2013 0530   CL 107 12/22/2013 0530   CL 99 04/08/2013 0922   CO2 26 01/03/2014 1035   CO2 22 12/22/2013 0530   BUN 23.9 01/03/2014 1035   BUN 26* 12/22/2013 0530   CREATININE 0.9 01/03/2014 1035   CREATININE 0.87 12/22/2013 0530      Component Value Date/Time   CALCIUM 9.6 01/03/2014 1035   CALCIUM 8.9 12/22/2013 0530   ALKPHOS 44 01/03/2014 1035   ALKPHOS 49 12/22/2013 0530   AST 14 01/03/2014 1035   AST 49* 12/22/2013 0530   ALT 19 01/03/2014 1035   ALT 105* 12/22/2013 0530   BILITOT 1.12 01/03/2014 1035   BILITOT 0.7 12/22/2013 0530     ASSESSMENT & PLAN:  #1 CLL We will proceed with treatment without dosage adjustment. I told her to hold chlorambucil. Overall she is responding well to treatment. I recommend she start taper prednisone to 10 mg every other daily until I see her in 2 weeks #2 anemia This is likely anemia of neoplastic disease. She is doing much better and with improvement of her hemoglobin. The patient denies recent history of bleeding such as epistaxis, hematuria or hematochezia. She is asymptomatic from the anemia. We will observe for now.  She does not require transfusion now.  #3  thrombocytopenia This is due to her underlying disease. It is improving. We will proceed without dose adjustment or delay #4 tumor lysis prophylaxis She will continue allopurinol until she runs offer prescription #5 antimicrobial prophylaxis She will continue acyclovir All questions were answered. The patient knows to call the clinic with any problems, questions or concerns. No barriers to learning was detected. I spent 25 minutes counseling the patient face to face. The total time spent in the appointment was 40 minutes and more than 50% was on counseling and review of test results     [facility], [place], MD 01/03/2014 11:42 AM

## 2014-01-09 ENCOUNTER — Ambulatory Visit: Payer: Medicare Other

## 2014-01-09 ENCOUNTER — Other Ambulatory Visit: Payer: Medicare Other

## 2014-01-10 ENCOUNTER — Other Ambulatory Visit: Payer: Medicare Other

## 2014-01-10 ENCOUNTER — Ambulatory Visit: Payer: Medicare Other

## 2014-01-16 ENCOUNTER — Telehealth: Payer: Self-pay | Admitting: Hematology and Oncology

## 2014-01-16 ENCOUNTER — Other Ambulatory Visit: Payer: Medicare Other

## 2014-01-16 ENCOUNTER — Encounter: Payer: Self-pay | Admitting: Hematology and Oncology

## 2014-01-16 ENCOUNTER — Ambulatory Visit: Payer: Medicare Other

## 2014-01-16 ENCOUNTER — Ambulatory Visit (HOSPITAL_BASED_OUTPATIENT_CLINIC_OR_DEPARTMENT_OTHER): Payer: Medicare Other

## 2014-01-16 ENCOUNTER — Ambulatory Visit (HOSPITAL_BASED_OUTPATIENT_CLINIC_OR_DEPARTMENT_OTHER): Payer: Medicare Other | Admitting: Hematology and Oncology

## 2014-01-16 VITALS — BP 132/40 | HR 70 | Temp 97.4°F | Resp 20 | Ht 60.0 in | Wt 113.6 lb

## 2014-01-16 VITALS — BP 122/40 | HR 74 | Temp 97.3°F | Resp 17

## 2014-01-16 DIAGNOSIS — C9110 Chronic lymphocytic leukemia of B-cell type not having achieved remission: Secondary | ICD-10-CM

## 2014-01-16 DIAGNOSIS — Z5112 Encounter for antineoplastic immunotherapy: Secondary | ICD-10-CM

## 2014-01-16 DIAGNOSIS — D649 Anemia, unspecified: Secondary | ICD-10-CM

## 2014-01-16 DIAGNOSIS — D72819 Decreased white blood cell count, unspecified: Secondary | ICD-10-CM

## 2014-01-16 DIAGNOSIS — D696 Thrombocytopenia, unspecified: Secondary | ICD-10-CM

## 2014-01-16 HISTORY — DX: Decreased white blood cell count, unspecified: D72.819

## 2014-01-16 LAB — COMPREHENSIVE METABOLIC PANEL (CC13)
ALK PHOS: 42 U/L (ref 40–150)
ALT: 14 U/L (ref 0–55)
AST: 15 U/L (ref 5–34)
Albumin: 3.9 g/dL (ref 3.5–5.0)
Anion Gap: 12 mEq/L — ABNORMAL HIGH (ref 3–11)
BILIRUBIN TOTAL: 0.84 mg/dL (ref 0.20–1.20)
BUN: 15.4 mg/dL (ref 7.0–26.0)
CO2: 23 mEq/L (ref 22–29)
Calcium: 10 mg/dL (ref 8.4–10.4)
Chloride: 109 mEq/L (ref 98–109)
Creatinine: 0.9 mg/dL (ref 0.6–1.1)
GLUCOSE: 96 mg/dL (ref 70–140)
Potassium: 4.3 mEq/L (ref 3.5–5.1)
SODIUM: 143 meq/L (ref 136–145)
Total Protein: 5.8 g/dL — ABNORMAL LOW (ref 6.4–8.3)

## 2014-01-16 LAB — CBC & DIFF AND RETIC
BASO%: 3 % — ABNORMAL HIGH (ref 0.0–2.0)
Basophils Absolute: 0.1 10*3/uL (ref 0.0–0.1)
EOS ABS: 0.1 10*3/uL (ref 0.0–0.5)
EOS%: 2.4 % (ref 0.0–7.0)
HCT: 28.6 % — ABNORMAL LOW (ref 34.8–46.6)
HGB: 9.6 g/dL — ABNORMAL LOW (ref 11.6–15.9)
Immature Retic Fract: 10.2 % — ABNORMAL HIGH (ref 1.60–10.00)
LYMPH%: 41 % (ref 14.0–49.7)
MCH: 33.3 pg (ref 25.1–34.0)
MCHC: 33.7 g/dL (ref 31.5–36.0)
MCV: 98.8 fL (ref 79.5–101.0)
MONO#: 0.4 10*3/uL (ref 0.1–0.9)
MONO%: 16.6 % — AB (ref 0.0–14.0)
NEUT%: 37 % — ABNORMAL LOW (ref 38.4–76.8)
NEUTROS ABS: 0.9 10*3/uL — AB (ref 1.5–6.5)
Platelets: 32 10*3/uL — ABNORMAL LOW (ref 145–400)
RBC: 2.9 10*6/uL — ABNORMAL LOW (ref 3.70–5.45)
RDW: 20.5 % — AB (ref 11.2–14.5)
RETIC %: 2.53 % — AB (ref 0.70–2.10)
Retic Ct Abs: 73.37 10*3/uL (ref 33.70–90.70)
WBC: 2.3 10*3/uL — ABNORMAL LOW (ref 3.9–10.3)
lymph#: 0.9 10*3/uL (ref 0.9–3.3)
nRBC: 0 % (ref 0–0)

## 2014-01-16 MED ORDER — ACETAMINOPHEN 325 MG PO TABS
650.0000 mg | ORAL_TABLET | Freq: Once | ORAL | Status: AC
  Administered 2014-01-16: 650 mg via ORAL

## 2014-01-16 MED ORDER — DEXAMETHASONE SODIUM PHOSPHATE 20 MG/5ML IJ SOLN
INTRAMUSCULAR | Status: AC
  Filled 2014-01-16: qty 5

## 2014-01-16 MED ORDER — DEXAMETHASONE SODIUM PHOSPHATE 20 MG/5ML IJ SOLN
20.0000 mg | Freq: Once | INTRAMUSCULAR | Status: AC
  Administered 2014-01-16: 20 mg via INTRAVENOUS

## 2014-01-16 MED ORDER — ACYCLOVIR 400 MG PO TABS: 400.0000 mg | ORAL_TABLET | Freq: Every day | ORAL | Status: DC

## 2014-01-16 MED ORDER — SODIUM CHLORIDE 0.9 % IJ SOLN
10.0000 mL | INTRAMUSCULAR | Status: DC | PRN
  Administered 2014-01-16: 10 mL
  Filled 2014-01-16: qty 10

## 2014-01-16 MED ORDER — SODIUM CHLORIDE 0.9 % IV SOLN
1000.0000 mg | Freq: Once | INTRAVENOUS | Status: AC
  Administered 2014-01-16: 1000 mg via INTRAVENOUS
  Filled 2014-01-16: qty 40

## 2014-01-16 MED ORDER — DIPHENHYDRAMINE HCL 50 MG/ML IJ SOLN
50.0000 mg | Freq: Once | INTRAMUSCULAR | Status: AC
  Administered 2014-01-16: 50 mg via INTRAVENOUS

## 2014-01-16 MED ORDER — HEPARIN SOD (PORK) LOCK FLUSH 100 UNIT/ML IV SOLN
500.0000 [IU] | Freq: Once | INTRAVENOUS | Status: AC | PRN
  Administered 2014-01-16: 500 [IU]
  Filled 2014-01-16: qty 5

## 2014-01-16 MED ORDER — ACETAMINOPHEN 325 MG PO TABS
ORAL_TABLET | ORAL | Status: AC
  Filled 2014-01-16: qty 2

## 2014-01-16 MED ORDER — SODIUM CHLORIDE 0.9 % IV SOLN
Freq: Once | INTRAVENOUS | Status: AC
  Administered 2014-01-16: 12:00:00 via INTRAVENOUS

## 2014-01-16 MED ORDER — DIPHENHYDRAMINE HCL 50 MG/ML IJ SOLN
INTRAMUSCULAR | Status: AC
  Filled 2014-01-16: qty 1

## 2014-01-16 MED ORDER — CIPROFLOXACIN HCL 250 MG PO TABS: 500.0000 mg | ORAL_TABLET | Freq: Two times a day (BID) | ORAL | Status: DC

## 2014-01-16 NOTE — Progress Notes (Signed)
[place] OFFICE PROGRESS NOTE  Patient Care Team: Precious Reel, MD as PCP - General (Internal Medicine) Heath Lark, MD as Consulting Physician (Hematology and Oncology)  DIAGNOSIS: CLL, ongoing treatment  SUMMARY OF ONCOLOGIC HISTORY: Oncology History   CLL stage IV, deletion 13q     CLL (chronic lymphocytic leukemia)   12/12/2013 Bone Marrow Biopsy Bone marrow biopsy show significant involvement of CLL   12/13/2013 Procedure Patient had placement of a port   12/14/2013 Imaging CT scan of the chest, abdomen and pelvis showed significant lymphadenopathy and splenomegaly   12/20/2013 - 12/22/2013 Hospital Admission The patient was admitted to the hospital to begin cycle 1 day 1 of chemotherapy due to risk of infusion reaction. She tolerated treatment well without major side effects.   12/20/2013 -  Chemotherapy started on cycle 1 of Obinutuzumab, chlorambucil and prednisone    INTERVAL HISTORY: Angelica Sullivan 80 y.o. female returns for further followup. She has some mild bruising but no bleeding. Her energy level is fair. She denies any recent fever, chills, night sweats or abnormal weight loss   I have reviewed the past medical history, past surgical history, social history and family history with the patient and they are unchanged from previous note.  ALLERGIES:  is allergic to bactrim.  MEDICATIONS:  Current Outpatient Prescriptions  Medication Sig Dispense Refill  . acyclovir (ZOVIRAX) 400 MG tablet Take 1 tablet (400 mg total) by mouth daily.  90 tablet  0  . allopurinol (ZYLOPRIM) 300 MG tablet Take 1 tablet (300 mg total) by mouth daily.  30 tablet  0  . atorvastatin (LIPITOR) 20 MG tablet Take 20 mg by mouth every evening.       . calcium citrate-vitamin D (CITRACAL+D) 315-200 MG-UNIT per tablet Take 1 tablet by mouth daily.       . Cholecalciferol (VITAMIN D) 400 UNITS capsule Take 400 Units by mouth daily.      . diphenhydrAMINE (BENADRYL) 25 mg capsule Take  25 mg by mouth at bedtime as needed for allergies.      . Echinacea 400 MG CAPS Take 1 capsule by mouth 2 (two) times daily.        Marland Kitchen ESTRACE VAGINAL 0.1 MG/GM vaginal cream Place 1 Applicatorful vaginally 2 (two) times a week.       . fish oil-omega-3 fatty acids 1000 MG capsule Take 2 g by mouth at bedtime.       . folic acid (FOLVITE) 509 MCG tablet Take 800 mcg by mouth daily.      Marland Kitchen glucosamine-chondroitin 500-400 MG tablet Take 1 tablet by mouth daily.        Marland Kitchen lidocaine-prilocaine (EMLA) cream Apply 1 application topically as needed (port access.).      Marland Kitchen loratadine (CLARITIN) 10 MG tablet Take 10 mg by mouth daily.        . metoprolol succinate (TOPROL-XL) 25 MG 24 hr tablet Take 12.5 mg by mouth every morning.       . montelukast (SINGULAIR) 10 MG tablet Take 10 mg by mouth daily.       . Multiple Vitamin (MULTIVITAMIN) tablet Take 1 tablet by mouth daily.        . ondansetron (ZOFRAN) 8 MG tablet Take 1 tablet (8 mg total) by mouth every 8 (eight) hours as needed for nausea.  30 tablet  3  . polyvinyl alcohol (ARTIFICIAL TEARS) 1.4 % ophthalmic solution Place 1 drop into both eyes 3 (three) times daily as  needed for dry eyes.       . predniSONE (DELTASONE) 20 MG tablet Take 10 mg by mouth every other day.       . sodium chloride (OCEAN) 0.65 % SOLN nasal spray Place 1 spray into both nostrils 2 (two) times daily.       Marland Kitchen tiZANidine (ZANAFLEX) 4 MG tablet Take 4 mg by mouth 3 (three) times a week.      . zolpidem (AMBIEN) 5 MG tablet Take 5 mg by mouth at bedtime as needed for sleep.       . ciprofloxacin (CIPRO) 250 MG tablet Take 2 tablets (500 mg total) by mouth 2 (two) times daily.  14 tablet  0   No current facility-administered medications for this visit.    REVIEW OF SYSTEMS:   Constitutional: Denies fevers, chills or abnormal weight loss Eyes: Denies blurriness of vision Ears, nose, mouth, throat, and face: Denies mucositis or sore throat Respiratory: Denies cough, dyspnea  or wheezes Cardiovascular: Denies palpitation, chest discomfort or lower extremity swelling Gastrointestinal:  Denies nausea, heartburn or change in bowel habits Skin: Denies abnormal skin rashes Lymphatics: Denies new lymphadenopathy  Neurological:Denies numbness, tingling or new weaknesses Behavioral/Psych: Mood is stable, no new changes  All other systems were reviewed with the patient and are negative.  PHYSICAL EXAMINATION: ECOG PERFORMANCE STATUS: 0 - Asymptomatic  Filed Vitals:   01/16/14 1039  BP: 132/40  Pulse: 70  Temp: 97.4 F (36.3 C)  Resp: 20   Filed Weights   01/16/14 1039  Weight: 113 lb 9.6 oz (51.529 kg)    GENERAL:alert, no distress and comfortable SKIN: skin color, texture, turgor are normal, no rashes or significant lesions. Noticed some bruising but no petechiae EYES: normal, Conjunctiva are pink and non-injected, sclera clear OROPHARYNX:no exudate, no erythema and lips, buccal mucosa, and tongue normal  NECK: supple, thyroid normal size, non-tender, without nodularity LYMPH:  no palpable lymphadenopathy in the cervical, axillary or inguinal LUNGS: clear to auscultation and percussion with normal breathing effort HEART: regular rate & rhythm and no murmurs and no lower extremity edema ABDOMEN:abdomen soft, non-tender and normal bowel sounds Musculoskeletal:no cyanosis of digits and no clubbing  NEURO: alert & oriented x 3 with fluent speech, no focal motor/sensory deficits  LABORATORY DATA:  I have reviewed the data as listed    Component Value Date/Time   NA 136 01/03/2014 1035   NA 141 12/22/2013 0530   K 4.1 01/03/2014 1035   K 4.0 12/22/2013 0530   CL 107 12/22/2013 0530   CL 99 04/08/2013 0922   CO2 26 01/03/2014 1035   CO2 22 12/22/2013 0530   GLUCOSE 96 01/03/2014 1035   GLUCOSE 115* 12/22/2013 0530   GLUCOSE 92 04/08/2013 0922   BUN 23.9 01/03/2014 1035   BUN 26* 12/22/2013 0530   CREATININE 0.9 01/03/2014 1035   CREATININE 0.87 12/22/2013 0530   CALCIUM  9.6 01/03/2014 1035   CALCIUM 8.9 12/22/2013 0530   PROT 5.5* 01/03/2014 1035   PROT 5.1* 12/22/2013 0530   ALBUMIN 3.7 01/03/2014 1035   ALBUMIN 3.3* 12/22/2013 0530   AST 14 01/03/2014 1035   AST 49* 12/22/2013 0530   ALT 19 01/03/2014 1035   ALT 105* 12/22/2013 0530   ALKPHOS 44 01/03/2014 1035   ALKPHOS 49 12/22/2013 0530   BILITOT 1.12 01/03/2014 1035   BILITOT 0.7 12/22/2013 0530   GFRNONAA 62* 12/22/2013 0530   GFRAA 72* 12/22/2013 0530    No results found for this  basename: SPEP,  UPEP,   kappa and lambda light chains    Lab Results  Component Value Date   WBC 2.3* 01/16/2014   NEUTROABS 0.9* 01/16/2014   HGB 9.6* 01/16/2014   HCT 28.6* 01/16/2014   MCV 98.8 01/16/2014   PLT 32* 01/16/2014      Chemistry      Component Value Date/Time   NA 136 01/03/2014 1035   NA 141 12/22/2013 0530   K 4.1 01/03/2014 1035   K 4.0 12/22/2013 0530   CL 107 12/22/2013 0530   CL 99 04/08/2013 0922   CO2 26 01/03/2014 1035   CO2 22 12/22/2013 0530   BUN 23.9 01/03/2014 1035   BUN 26* 12/22/2013 0530   CREATININE 0.9 01/03/2014 1035   CREATININE 0.87 12/22/2013 0530      Component Value Date/Time   CALCIUM 9.6 01/03/2014 1035   CALCIUM 8.9 12/22/2013 0530   ALKPHOS 44 01/03/2014 1035   ALKPHOS 49 12/22/2013 0530   AST 14 01/03/2014 1035   AST 49* 12/22/2013 0530   ALT 19 01/03/2014 1035   ALT 105* 12/22/2013 0530   BILITOT 1.12 01/03/2014 1035   BILITOT 0.7 12/22/2013 0530    ASSESSMENT & PLAN:  #1 CLL We will proceed with treatment without dosage adjustment. I told her to hold chlorambucil. Overall she is responding well to treatment. I told the patient to stop prednisone. #2 anemia This is likely anemia of neoplastic disease. She is doing much better and with improvement of her hemoglobin. The patient denies recent history of bleeding such as epistaxis, hematuria or hematochezia. She is asymptomatic from the anemia. We will observe for now.  She does not require transfusion now.  #3 thrombocytopenia This is due to her  underlying disease. It is improving. We will proceed without dose adjustment or delay #4 leukopenia This is likely due to recent treatment. The patient denies recent history of fevers, cough, chills, diarrhea or dysuria. She is asymptomatic from the leukopenia. I will observe for now.  I will continue the chemotherapy at current dose without dosage adjustment.  If the leukopenia gets progressive worse in the future, I might have to delay her treatment or adjust the chemotherapy dose. I gave her a prescription of ciprofloxacin to hang onto it in case she developed signs and symptoms of infection. #5 antimicrobial prophylaxis She will continue acyclovir   All questions were answered. The patient knows to call the clinic with any problems, questions or concerns. No barriers to learning was detected. I spent 25 minutes counseling the patient face to face. The total time spent in the appointment was 40 minutes and more than 50% was on counseling and review of test results     [facility], [place], MD 01/16/2014 11:08 AM

## 2014-01-16 NOTE — Telephone Encounter (Signed)
gv pt appt schedule for march/april °

## 2014-01-16 NOTE — Patient Instructions (Signed)
[place] Discharge Instructions for Patients Receiving Chemotherapy  Today you received the following chemotherapy agents: Gazyva  To help prevent nausea and vomiting after your treatment, we encourage you to take your nausea medication as prescribed by your physician.    If you develop nausea and vomiting that is not controlled by your nausea medication, call the clinic.   BELOW ARE SYMPTOMS THAT SHOULD BE REPORTED IMMEDIATELY:  *FEVER GREATER THAN 100.5 F  *CHILLS WITH OR WITHOUT FEVER  NAUSEA AND VOMITING THAT IS NOT CONTROLLED WITH YOUR NAUSEA MEDICATION  *UNUSUAL SHORTNESS OF BREATH  *UNUSUAL BRUISING OR BLEEDING  TENDERNESS IN MOUTH AND THROAT WITH OR WITHOUT PRESENCE OF ULCERS  *URINARY PROBLEMS  *BOWEL PROBLEMS  UNUSUAL RASH Items with * indicate a potential emergency and should be followed up as soon as possible.  Feel free to call the clinic you have any questions or concerns. The clinic phone number is [phone].

## 2014-01-17 ENCOUNTER — Other Ambulatory Visit: Payer: Medicare Other

## 2014-01-17 ENCOUNTER — Ambulatory Visit: Payer: Medicare Other

## 2014-01-23 ENCOUNTER — Encounter: Payer: Self-pay | Admitting: Hematology and Oncology

## 2014-01-30 ENCOUNTER — Ambulatory Visit (HOSPITAL_BASED_OUTPATIENT_CLINIC_OR_DEPARTMENT_OTHER): Payer: Medicare Other | Admitting: Hematology and Oncology

## 2014-01-30 ENCOUNTER — Telehealth: Payer: Self-pay | Admitting: Hematology and Oncology

## 2014-01-30 ENCOUNTER — Other Ambulatory Visit (HOSPITAL_BASED_OUTPATIENT_CLINIC_OR_DEPARTMENT_OTHER): Payer: Medicare Other

## 2014-01-30 ENCOUNTER — Encounter: Payer: Self-pay | Admitting: Hematology and Oncology

## 2014-01-30 VITALS — BP 138/43 | HR 76 | Temp 97.4°F | Resp 20 | Ht 60.0 in | Wt 112.9 lb

## 2014-01-30 DIAGNOSIS — D649 Anemia, unspecified: Secondary | ICD-10-CM

## 2014-01-30 DIAGNOSIS — C9110 Chronic lymphocytic leukemia of B-cell type not having achieved remission: Secondary | ICD-10-CM

## 2014-01-30 DIAGNOSIS — D696 Thrombocytopenia, unspecified: Secondary | ICD-10-CM

## 2014-01-30 DIAGNOSIS — D72819 Decreased white blood cell count, unspecified: Secondary | ICD-10-CM

## 2014-01-30 LAB — COMPREHENSIVE METABOLIC PANEL (CC13)
ALBUMIN: 3.9 g/dL (ref 3.5–5.0)
ALT: 11 U/L (ref 0–55)
ANION GAP: 9 meq/L (ref 3–11)
AST: 17 U/L (ref 5–34)
Alkaline Phosphatase: 41 U/L (ref 40–150)
BUN: 19.7 mg/dL (ref 7.0–26.0)
CALCIUM: 9.9 mg/dL (ref 8.4–10.4)
CO2: 23 mEq/L (ref 22–29)
Chloride: 110 mEq/L — ABNORMAL HIGH (ref 98–109)
Creatinine: 0.9 mg/dL (ref 0.6–1.1)
Glucose: 101 mg/dl (ref 70–140)
POTASSIUM: 4.5 meq/L (ref 3.5–5.1)
Sodium: 142 mEq/L (ref 136–145)
Total Bilirubin: 0.74 mg/dL (ref 0.20–1.20)
Total Protein: 5.8 g/dL — ABNORMAL LOW (ref 6.4–8.3)

## 2014-01-30 LAB — CBC & DIFF AND RETIC
BASO%: 2.2 % — ABNORMAL HIGH (ref 0.0–2.0)
BASOS ABS: 0.1 10*3/uL (ref 0.0–0.1)
EOS%: 2.5 % (ref 0.0–7.0)
Eosinophils Absolute: 0.1 10*3/uL (ref 0.0–0.5)
HEMATOCRIT: 30.1 % — AB (ref 34.8–46.6)
HGB: 10 g/dL — ABNORMAL LOW (ref 11.6–15.9)
Immature Retic Fract: 13.8 % — ABNORMAL HIGH (ref 1.60–10.00)
LYMPH#: 1 10*3/uL (ref 0.9–3.3)
LYMPH%: 34.3 % (ref 14.0–49.7)
MCH: 32.8 pg (ref 25.1–34.0)
MCHC: 33.2 g/dL (ref 31.5–36.0)
MCV: 98.7 fL (ref 79.5–101.0)
MONO#: 0.4 10*3/uL (ref 0.1–0.9)
MONO%: 15.2 % — ABNORMAL HIGH (ref 0.0–14.0)
NEUT#: 1.3 10*3/uL — ABNORMAL LOW (ref 1.5–6.5)
NEUT%: 45.8 % (ref 38.4–76.8)
Platelets: 34 10*3/uL — ABNORMAL LOW (ref 145–400)
RBC: 3.05 10*6/uL — ABNORMAL LOW (ref 3.70–5.45)
RDW: 18.3 % — ABNORMAL HIGH (ref 11.2–14.5)
RETIC CT ABS: 86.01 10*3/uL (ref 33.70–90.70)
Retic %: 2.82 % — ABNORMAL HIGH (ref 0.70–2.10)
WBC: 2.8 10*3/uL — ABNORMAL LOW (ref 3.9–10.3)
nRBC: 0 % (ref 0–0)

## 2014-01-30 NOTE — Telephone Encounter (Signed)
gv pt appt schedule for april/may

## 2014-01-30 NOTE — Progress Notes (Signed)
[facility] OFFICE PROGRESS NOTE  Patient Care Team: Precious Reel, MD as PCP - General (Internal Medicine) Heath Lark, MD as Consulting Physician (Hematology and Oncology)  DIAGNOSIS: CLL, for further management  SUMMARY OF ONCOLOGIC HISTORY: Oncology History   CLL stage IV, deletion 13q     CLL (chronic lymphocytic leukemia)   12/12/2013 Bone Marrow Biopsy Bone marrow biopsy show significant involvement of CLL   12/13/2013 Procedure Patient had placement of a port   12/14/2013 Imaging CT scan of the chest, abdomen and pelvis showed significant lymphadenopathy and splenomegaly   12/20/2013 - 12/22/2013 Hospital Admission The patient was admitted to the hospital to begin cycle 1 day 1 of chemotherapy due to risk of infusion reaction. She tolerated treatment well without major side effects.   12/20/2013 -  Chemotherapy started on cycle 1 of Obinutuzumab, chlorambucil and prednisone    INTERVAL HISTORY: Angelica Sullivan 80 y.o. female returns for further followup. Her only complaint is some red spots on her face and legs. The skin spots on the each. She still has easy bruising. Her appetite is improving. She denies any new lymphadenopathy. She complained of postnasal drip. Denies recent cough. She denies any recent fever, chills, night sweats or abnormal weight loss  I have reviewed the past medical history, past surgical history, social history and family history with the patient and they are unchanged from previous note.  ALLERGIES:  is allergic to bactrim.  MEDICATIONS:  Current Outpatient Prescriptions  Medication Sig Dispense Refill  . acyclovir (ZOVIRAX) 400 MG tablet Take 1 tablet (400 mg total) by mouth daily.  90 tablet  0  . atorvastatin (LIPITOR) 20 MG tablet Take 20 mg by mouth every evening.       . calcium citrate-vitamin D (CITRACAL+D) 315-200 MG-UNIT per tablet Take 1 tablet by mouth daily.       . Cholecalciferol (VITAMIN D) 400 UNITS capsule Take 400 Units  by mouth daily.      . diphenhydrAMINE (BENADRYL) 25 mg capsule Take 25 mg by mouth at bedtime as needed for allergies.      . Echinacea 400 MG CAPS Take 1 capsule by mouth 2 (two) times daily.        Marland Kitchen ESTRACE VAGINAL 0.1 MG/GM vaginal cream Place 1 Applicatorful vaginally 2 (two) times a week.       . fish oil-omega-3 fatty acids 1000 MG capsule Take 2 g by mouth at bedtime.       . folic acid (FOLVITE) 169 MCG tablet Take 800 mcg by mouth daily.      Marland Kitchen glucosamine-chondroitin 500-400 MG tablet Take 1 tablet by mouth daily.        Marland Kitchen lidocaine-prilocaine (EMLA) cream Apply 1 application topically as needed (port access.).      Marland Kitchen loratadine (CLARITIN) 10 MG tablet Take 10 mg by mouth daily.        . metoprolol succinate (TOPROL-XL) 25 MG 24 hr tablet Take 12.5 mg by mouth every morning.       . montelukast (SINGULAIR) 10 MG tablet Take 10 mg by mouth daily.       . Multiple Vitamin (MULTIVITAMIN) tablet Take 1 tablet by mouth daily.        . ondansetron (ZOFRAN) 8 MG tablet Take 1 tablet (8 mg total) by mouth every 8 (eight) hours as needed for nausea.  30 tablet  3  . polyvinyl alcohol (ARTIFICIAL TEARS) 1.4 % ophthalmic solution Place 1 drop into both  eyes 3 (three) times daily as needed for dry eyes.       . sodium chloride (OCEAN) 0.65 % SOLN nasal spray Place 1 spray into both nostrils 2 (two) times daily.       Marland Kitchen tiZANidine (ZANAFLEX) 4 MG tablet Take 4 mg by mouth 3 (three) times a week.      . zolpidem (AMBIEN) 5 MG tablet Take 5 mg by mouth at bedtime as needed for sleep.       . ciprofloxacin (CIPRO) 250 MG tablet Take 500 mg by mouth 2 (two) times daily. Start for any signs of infection.       No current facility-administered medications for this visit.    REVIEW OF SYSTEMS:   Constitutional: Denies fevers, chills or abnormal weight loss Behavioral/Psych: Mood is stable, no new changes  All other systems were reviewed with the patient and are negative.  PHYSICAL  EXAMINATION: ECOG PERFORMANCE STATUS: 0 - Asymptomatic  Filed Vitals:   01/30/14 0931  BP: 138/43  Pulse: 76  Temp: 97.4 F (36.3 C)  Resp: 20   Filed Weights   01/30/14 0931  Weight: 112 lb 14.4 oz (51.211 kg)    GENERAL:alert, no distress and comfortable SKIN: skin color, texture, turgor are normal, no rashes or significant lesions. No petechiae rash EYES: normal, Conjunctiva are pink and non-injected, sclera clear Musculoskeletal:no cyanosis of digits and no clubbing  NEURO: alert & oriented x 3 with fluent speech, no focal motor/sensory deficits  LABORATORY DATA:  I have reviewed the data as listed    Component Value Date/Time   NA 143 01/16/2014 1026   NA 141 12/22/2013 0530   K 4.3 01/16/2014 1026   K 4.0 12/22/2013 0530   CL 107 12/22/2013 0530   CL 99 04/08/2013 0922   CO2 23 01/16/2014 1026   CO2 22 12/22/2013 0530   GLUCOSE 96 01/16/2014 1026   GLUCOSE 115* 12/22/2013 0530   GLUCOSE 92 04/08/2013 0922   BUN 15.4 01/16/2014 1026   BUN 26* 12/22/2013 0530   CREATININE 0.9 01/16/2014 1026   CREATININE 0.87 12/22/2013 0530   CALCIUM 10.0 01/16/2014 1026   CALCIUM 8.9 12/22/2013 0530   PROT 5.8* 01/16/2014 1026   PROT 5.1* 12/22/2013 0530   ALBUMIN 3.9 01/16/2014 1026   ALBUMIN 3.3* 12/22/2013 0530   AST 15 01/16/2014 1026   AST 49* 12/22/2013 0530   ALT 14 01/16/2014 1026   ALT 105* 12/22/2013 0530   ALKPHOS 42 01/16/2014 1026   ALKPHOS 49 12/22/2013 0530   BILITOT 0.84 01/16/2014 1026   BILITOT 0.7 12/22/2013 0530   GFRNONAA 62* 12/22/2013 0530   GFRAA 72* 12/22/2013 0530    No results found for this basename: SPEP, UPEP,  kappa and lambda light chains    Lab Results  Component Value Date   WBC 2.8* 01/30/2014   NEUTROABS 1.3* 01/30/2014   HGB 10.0* 01/30/2014   HCT 30.1* 01/30/2014   MCV 98.7 01/30/2014   PLT 34* 01/30/2014      Chemistry      Component Value Date/Time   NA 143 01/16/2014 1026   NA 141 12/22/2013 0530   K 4.3 01/16/2014 1026   K 4.0 12/22/2013 0530   CL  107 12/22/2013 0530   CL 99 04/08/2013 0922   CO2 23 01/16/2014 1026   CO2 22 12/22/2013 0530   BUN 15.4 01/16/2014 1026   BUN 26* 12/22/2013 0530   CREATININE 0.9 01/16/2014 1026   CREATININE 0.87 12/22/2013  0530      Component Value Date/Time   CALCIUM 10.0 01/16/2014 1026   CALCIUM 8.9 12/22/2013 0530   ALKPHOS 42 01/16/2014 1026   ALKPHOS 49 12/22/2013 0530   AST 15 01/16/2014 1026   AST 49* 12/22/2013 0530   ALT 14 01/16/2014 1026   ALT 105* 12/22/2013 0530   BILITOT 0.84 01/16/2014 1026   BILITOT 0.7 12/22/2013 0530     ASSESSMENT & PLAN:  #1 CLL Her blood counts are still too low. I told her to continue holding off taking chlorambucil. Her next infusion of treatment is scheduled for 2 weeks. #2 anemia This is likely anemia of chronic disease. The patient denies recent history of bleeding such as epistaxis, hematuria or hematochezia. She is asymptomatic from the anemia. We will observe for now.  She does not require transfusion now.  #3 thrombocytopenia This is due to underlying disease. It is improving. #4 leukopenia This is likely due to recent treatment. The patient denies recent history of fevers, cough, chills, diarrhea or dysuria. She is asymptomatic from the leukopenia. I will observe for now.  #5 antimicrobial prophylaxis She will continue acyclovir #6 postnasal drip I suspect that might be an allergic component. There are no signs of infection.  Orders Placed This Encounter  Procedures  . Comprehensive metabolic panel    Standing Status: Future     Number of Occurrences:      Standing Expiration Date: 01/30/2015  . Lactate dehydrogenase    Standing Status: Future     Number of Occurrences:      Standing Expiration Date: 01/30/2015  . CBC with Differential    Standing Status: Future     Number of Occurrences:      Standing Expiration Date: 01/30/2015   All questions were answered. The patient knows to call the clinic with any problems, questions or concerns. No barriers to  learning was detected. I spent 15 minutes counseling the patient face to face. The total time spent in the appointment was 20 minutes and more than 50% was on counseling and review of test results     [facility], [place], MD 01/30/2014 9:57 AM

## 2014-02-13 ENCOUNTER — Telehealth: Payer: Self-pay | Admitting: Hematology and Oncology

## 2014-02-13 ENCOUNTER — Ambulatory Visit (HOSPITAL_BASED_OUTPATIENT_CLINIC_OR_DEPARTMENT_OTHER): Payer: Medicare Other

## 2014-02-13 ENCOUNTER — Other Ambulatory Visit (HOSPITAL_BASED_OUTPATIENT_CLINIC_OR_DEPARTMENT_OTHER): Payer: Medicare Other

## 2014-02-13 ENCOUNTER — Ambulatory Visit (HOSPITAL_BASED_OUTPATIENT_CLINIC_OR_DEPARTMENT_OTHER): Payer: Medicare Other | Admitting: Hematology and Oncology

## 2014-02-13 VITALS — BP 116/61 | HR 70 | Temp 98.0°F | Resp 18

## 2014-02-13 VITALS — BP 122/46 | HR 74 | Temp 97.4°F | Resp 18 | Ht 60.0 in | Wt 112.7 lb

## 2014-02-13 DIAGNOSIS — C9110 Chronic lymphocytic leukemia of B-cell type not having achieved remission: Secondary | ICD-10-CM

## 2014-02-13 DIAGNOSIS — D649 Anemia, unspecified: Secondary | ICD-10-CM

## 2014-02-13 DIAGNOSIS — D72819 Decreased white blood cell count, unspecified: Secondary | ICD-10-CM

## 2014-02-13 DIAGNOSIS — Z5112 Encounter for antineoplastic immunotherapy: Secondary | ICD-10-CM

## 2014-02-13 DIAGNOSIS — D696 Thrombocytopenia, unspecified: Secondary | ICD-10-CM

## 2014-02-13 LAB — CBC WITH DIFFERENTIAL/PLATELET
BASO%: 2.4 % — ABNORMAL HIGH (ref 0.0–2.0)
Basophils Absolute: 0.1 10*3/uL (ref 0.0–0.1)
EOS ABS: 0.1 10*3/uL (ref 0.0–0.5)
EOS%: 3.7 % (ref 0.0–7.0)
HEMATOCRIT: 32.3 % — AB (ref 34.8–46.6)
HGB: 10.9 g/dL — ABNORMAL LOW (ref 11.6–15.9)
LYMPH%: 34 % (ref 14.0–49.7)
MCH: 33.6 pg (ref 25.1–34.0)
MCHC: 33.7 g/dL (ref 31.5–36.0)
MCV: 99.9 fL (ref 79.5–101.0)
MONO#: 0.4 10*3/uL (ref 0.1–0.9)
MONO%: 15.9 % — ABNORMAL HIGH (ref 0.0–14.0)
NEUT%: 44 % (ref 38.4–76.8)
NEUTROS ABS: 1 10*3/uL — AB (ref 1.5–6.5)
PLATELETS: 42 10*3/uL — AB (ref 145–400)
RBC: 3.23 10*6/uL — ABNORMAL LOW (ref 3.70–5.45)
RDW: 17.2 % — ABNORMAL HIGH (ref 11.2–14.5)
WBC: 2.4 10*3/uL — ABNORMAL LOW (ref 3.9–10.3)
lymph#: 0.8 10*3/uL — ABNORMAL LOW (ref 0.9–3.3)

## 2014-02-13 LAB — COMPREHENSIVE METABOLIC PANEL (CC13)
ALBUMIN: 4.2 g/dL (ref 3.5–5.0)
ALK PHOS: 46 U/L (ref 40–150)
ALT: 14 U/L (ref 0–55)
AST: 20 U/L (ref 5–34)
Anion Gap: 8 mEq/L (ref 3–11)
BUN: 16.4 mg/dL (ref 7.0–26.0)
CO2: 25 mEq/L (ref 22–29)
CREATININE: 0.9 mg/dL (ref 0.6–1.1)
Calcium: 9.9 mg/dL (ref 8.4–10.4)
Chloride: 107 mEq/L (ref 98–109)
Glucose: 112 mg/dl (ref 70–140)
POTASSIUM: 3.9 meq/L (ref 3.5–5.1)
Sodium: 140 mEq/L (ref 136–145)
Total Bilirubin: 0.78 mg/dL (ref 0.20–1.20)
Total Protein: 6.1 g/dL — ABNORMAL LOW (ref 6.4–8.3)

## 2014-02-13 LAB — LACTATE DEHYDROGENASE (CC13): LDH: 166 U/L (ref 125–245)

## 2014-02-13 MED ORDER — DEXAMETHASONE SODIUM PHOSPHATE 20 MG/5ML IJ SOLN
20.0000 mg | Freq: Once | INTRAMUSCULAR | Status: AC
  Administered 2014-02-13: 20 mg via INTRAVENOUS

## 2014-02-13 MED ORDER — DEXAMETHASONE SODIUM PHOSPHATE 20 MG/5ML IJ SOLN
INTRAMUSCULAR | Status: AC
  Filled 2014-02-13: qty 5

## 2014-02-13 MED ORDER — DIPHENHYDRAMINE HCL 50 MG/ML IJ SOLN
50.0000 mg | Freq: Once | INTRAMUSCULAR | Status: AC
  Administered 2014-02-13: 50 mg via INTRAVENOUS

## 2014-02-13 MED ORDER — HEPARIN SOD (PORK) LOCK FLUSH 100 UNIT/ML IV SOLN
500.0000 [IU] | Freq: Once | INTRAVENOUS | Status: AC | PRN
  Administered 2014-02-13: 500 [IU]
  Filled 2014-02-13: qty 5

## 2014-02-13 MED ORDER — SODIUM CHLORIDE 0.9 % IJ SOLN
10.0000 mL | INTRAMUSCULAR | Status: DC | PRN
  Administered 2014-02-13: 10 mL
  Filled 2014-02-13: qty 10

## 2014-02-13 MED ORDER — ACETAMINOPHEN 325 MG PO TABS
650.0000 mg | ORAL_TABLET | Freq: Once | ORAL | Status: AC
  Administered 2014-02-13: 650 mg via ORAL

## 2014-02-13 MED ORDER — SODIUM CHLORIDE 0.9 % IV SOLN
Freq: Once | INTRAVENOUS | Status: AC
  Administered 2014-02-13: 11:00:00 via INTRAVENOUS

## 2014-02-13 MED ORDER — DIPHENHYDRAMINE HCL 50 MG/ML IJ SOLN
INTRAMUSCULAR | Status: AC
  Filled 2014-02-13: qty 1

## 2014-02-13 MED ORDER — ACETAMINOPHEN 325 MG PO TABS
ORAL_TABLET | ORAL | Status: AC
  Filled 2014-02-13: qty 2

## 2014-02-13 MED ORDER — SODIUM CHLORIDE 0.9 % IV SOLN
1000.0000 mg | Freq: Once | INTRAVENOUS | Status: AC
  Administered 2014-02-13: 1000 mg via INTRAVENOUS
  Filled 2014-02-13: qty 40

## 2014-02-13 NOTE — Progress Notes (Signed)
Pt saw Dr. Alvy Bimler today prior to chemo.   Confirmed that md had reviewed all lab results today.  Proceed with chemo as ordered.

## 2014-02-13 NOTE — Telephone Encounter (Signed)
[facility] APPT SCHEDULE FOR MAY/JUNE °

## 2014-02-13 NOTE — Progress Notes (Signed)
[place] OFFICE PROGRESS NOTE  Patient Care Team: Precious Reel, MD as PCP - General (Internal Medicine) Heath Lark, MD as Consulting Physician (Hematology and Oncology)  DIAGNOSIS: CLL, seem prior to cycle 2 of treatment  SUMMARY OF ONCOLOGIC HISTORY: Oncology History   CLL stage IV, deletion 13q     CLL (chronic lymphocytic leukemia)   12/12/2013 Bone Marrow Biopsy Bone marrow biopsy show significant involvement of CLL   12/13/2013 Procedure Patient had placement of a port   12/14/2013 Imaging CT scan of the chest, abdomen and pelvis showed significant lymphadenopathy and splenomegaly   12/20/2013 - 12/22/2013 Hospital Admission The patient was admitted to the hospital to begin cycle 1 day 1 of chemotherapy due to risk of infusion reaction. She tolerated treatment well without major side effects.   12/20/2013 -  Chemotherapy started on cycle 1 of Obinutuzumab, chlorambucil and prednisone    INTERVAL HISTORY: Angelica Sullivan 80 y.o. female returns for further followup. She continues to bruise easily but no bleeding. Her energy level is fair. Denies any lymphadenopathy.  I have reviewed the past medical history, past surgical history, social history and family history with the patient and they are unchanged from previous note.  ALLERGIES:  is allergic to bactrim.  MEDICATIONS:  Current Outpatient Prescriptions  Medication Sig Dispense Refill  . acyclovir (ZOVIRAX) 400 MG tablet Take 1 tablet (400 mg total) by mouth daily.  90 tablet  0  . atorvastatin (LIPITOR) 20 MG tablet Take 20 mg by mouth every evening.       . calcium citrate-vitamin D (CITRACAL+D) 315-200 MG-UNIT per tablet Take 1 tablet by mouth daily.       . Cholecalciferol (VITAMIN D) 400 UNITS capsule Take 400 Units by mouth daily.      . diphenhydrAMINE (BENADRYL) 25 mg capsule Take 25 mg by mouth at bedtime as needed for allergies.      . Echinacea 400 MG CAPS Take 1 capsule by mouth 2 (two) times daily.         Marland Kitchen ESTRACE VAGINAL 0.1 MG/GM vaginal cream Place 1 Applicatorful vaginally 2 (two) times a week.       . fish oil-omega-3 fatty acids 1000 MG capsule Take 2 g by mouth at bedtime.       . folic acid (FOLVITE) 443 MCG tablet Take 800 mcg by mouth daily.      Marland Kitchen glucosamine-chondroitin 500-400 MG tablet Take 1 tablet by mouth daily.        Marland Kitchen lidocaine-prilocaine (EMLA) cream Apply 1 application topically as needed (port access.).      Marland Kitchen loratadine (CLARITIN) 10 MG tablet Take 10 mg by mouth daily.        . metoprolol succinate (TOPROL-XL) 25 MG 24 hr tablet Take 12.5 mg by mouth every morning.       . montelukast (SINGULAIR) 10 MG tablet Take 10 mg by mouth daily.       . Multiple Vitamin (MULTIVITAMIN) tablet Take 1 tablet by mouth daily.        . polyvinyl alcohol (ARTIFICIAL TEARS) 1.4 % ophthalmic solution Place 1 drop into both eyes 3 (three) times daily as needed for dry eyes.       . sodium chloride (OCEAN) 0.65 % SOLN nasal spray Place 1 spray into both nostrils 2 (two) times daily.       Marland Kitchen tiZANidine (ZANAFLEX) 4 MG tablet Take 4 mg by mouth 3 (three) times a week.      Marland Kitchen  zolpidem (AMBIEN) 5 MG tablet Take 5 mg by mouth at bedtime as needed for sleep.       . ciprofloxacin (CIPRO) 250 MG tablet Take 500 mg by mouth 2 (two) times daily. Start for any signs of infection.      . ondansetron (ZOFRAN) 8 MG tablet Take 1 tablet (8 mg total) by mouth every 8 (eight) hours as needed for nausea.  30 tablet  3   No current facility-administered medications for this visit.   Facility-Administered Medications Ordered in Other Visits  Medication Dose Route Frequency Provider Last Rate Last Dose  . heparin lock flush 100 unit/mL  500 Units Intracatheter Once PRN Heath Lark, MD      . sodium chloride 0.9 % injection 10 mL  10 mL Intracatheter PRN Heath Lark, MD        REVIEW OF SYSTEMS:   Constitutional: Denies fevers, chills or abnormal weight loss Eyes: Denies blurriness of vision Ears,  nose, mouth, throat, and face: Denies mucositis or sore throat Respiratory: Denies cough, dyspnea or wheezes Cardiovascular: Denies palpitation, chest discomfort or lower extremity swelling Gastrointestinal:  Denies nausea, heartburn or change in bowel habits Skin: Denies abnormal skin rashes Lymphatics: Denies new lymphadenopathy or easy bruising Neurological:Denies numbness, tingling or new weaknesses Behavioral/Psych: Mood is stable, no new changes  All other systems were reviewed with the patient and are negative.  PHYSICAL EXAMINATION: ECOG PERFORMANCE STATUS: 0 - Asymptomatic  Filed Vitals:   02/13/14 0954  BP: 122/46  Pulse: 74  Temp: 97.4 F (36.3 C)  Resp: 18   Filed Weights   02/13/14 0954  Weight: 112 lb 11.2 oz (51.12 kg)    GENERAL:alert, no distress and comfortable SKIN: Noticed thin skin with bruising but no petechiae rash EYES: normal, Conjunctiva are pink and non-injected, sclera clear OROPHARYNX:no exudate, no erythema and lips, buccal mucosa, and tongue normal  NECK: supple, thyroid normal size, non-tender, without nodularity LYMPH:  no palpable lymphadenopathy in the cervical, axillary or inguinal LUNGS: clear to auscultation and percussion with normal breathing effort HEART: regular rate & rhythm and no murmurs and no lower extremity edema ABDOMEN:abdomen soft, non-tender and normal bowel sounds Musculoskeletal:no cyanosis of digits and no clubbing  NEURO: alert & oriented x 3 with fluent speech, no focal motor/sensory deficits  LABORATORY DATA:  I have reviewed the data as listed    Component Value Date/Time   NA 140 02/13/2014 0919   NA 141 12/22/2013 0530   K 3.9 02/13/2014 0919   K 4.0 12/22/2013 0530   CL 107 12/22/2013 0530   CL 99 04/08/2013 0922   CO2 25 02/13/2014 0919   CO2 22 12/22/2013 0530   GLUCOSE 112 02/13/2014 0919   GLUCOSE 115* 12/22/2013 0530   GLUCOSE 92 04/08/2013 0922   BUN 16.4 02/13/2014 0919   BUN 26* 12/22/2013 0530   CREATININE  0.9 02/13/2014 0919   CREATININE 0.87 12/22/2013 0530   CALCIUM 9.9 02/13/2014 0919   CALCIUM 8.9 12/22/2013 0530   PROT 6.1* 02/13/2014 0919   PROT 5.1* 12/22/2013 0530   ALBUMIN 4.2 02/13/2014 0919   ALBUMIN 3.3* 12/22/2013 0530   AST 20 02/13/2014 0919   AST 49* 12/22/2013 0530   ALT 14 02/13/2014 0919   ALT 105* 12/22/2013 0530   ALKPHOS 46 02/13/2014 0919   ALKPHOS 49 12/22/2013 0530   BILITOT 0.78 02/13/2014 0919   BILITOT 0.7 12/22/2013 0530   GFRNONAA 62* 12/22/2013 0530   GFRAA 72* 12/22/2013 0530  No results found for this basename: SPEP,  UPEP,   kappa and lambda light chains    Lab Results  Component Value Date   WBC 2.4* 02/13/2014   NEUTROABS 1.0* 02/13/2014   HGB 10.9* 02/13/2014   HCT 32.3* 02/13/2014   MCV 99.9 02/13/2014   PLT 42* 02/13/2014      Chemistry      Component Value Date/Time   NA 140 02/13/2014 0919   NA 141 12/22/2013 0530   K 3.9 02/13/2014 0919   K 4.0 12/22/2013 0530   CL 107 12/22/2013 0530   CL 99 04/08/2013 0922   CO2 25 02/13/2014 0919   CO2 22 12/22/2013 0530   BUN 16.4 02/13/2014 0919   BUN 26* 12/22/2013 0530   CREATININE 0.9 02/13/2014 0919   CREATININE 0.87 12/22/2013 0530      Component Value Date/Time   CALCIUM 9.9 02/13/2014 0919   CALCIUM 8.9 12/22/2013 0530   ALKPHOS 46 02/13/2014 0919   ALKPHOS 49 12/22/2013 0530   AST 20 02/13/2014 0919   AST 49* 12/22/2013 0530   ALT 14 02/13/2014 0919   ALT 105* 12/22/2013 0530   BILITOT 0.78 02/13/2014 0919   BILITOT 0.7 12/22/2013 0530       ASSESSMENT & PLAN:  #1 CLL Her blood counts are still too low. I told her to continue holding off taking chlorambucil. Her next infusion of treatment is scheduled for 4 weeks. #2 anemia This is likely anemia of chronic disease. The patient denies recent history of bleeding such as epistaxis, hematuria or hematochezia. She is asymptomatic from the anemia. We will observe for now.  She does not require transfusion now.  #3 thrombocytopenia This is due to underlying  disease. It is improving. #4 leukopenia This is likely due to recent treatment. The patient denies recent history of fevers, cough, chills, diarrhea or dysuria. She is asymptomatic from the leukopenia. I will observe for now.  #5 antimicrobial prophylaxis She will continue acyclovir  All questions were answered. The patient knows to call the clinic with any problems, questions or concerns. No barriers to learning was detected. I spent 25 minutes counseling the patient face to face. The total time spent in the appointment was 30 minutes and more than 50% was on counseling and review of test results     Heath Lark, MD 02/13/2014 12:25 PM

## 2014-02-13 NOTE — Patient Instructions (Signed)
[place] Discharge Instructions for Patients Receiving Chemotherapy  Today you received the following chemotherapy agents :  Gazyva.  To help prevent nausea and vomiting after your treatment, we encourage you to take your nausea medication as instructed by your physician.   If you develop nausea and vomiting that is not controlled by your nausea medication, call the clinic.   BELOW ARE SYMPTOMS THAT SHOULD BE REPORTED IMMEDIATELY:  *FEVER GREATER THAN 100.5 F  *CHILLS WITH OR WITHOUT FEVER  NAUSEA AND VOMITING THAT IS NOT CONTROLLED WITH YOUR NAUSEA MEDICATION  *UNUSUAL SHORTNESS OF BREATH  *UNUSUAL BRUISING OR BLEEDING  TENDERNESS IN MOUTH AND THROAT WITH OR WITHOUT PRESENCE OF ULCERS  *URINARY PROBLEMS  *BOWEL PROBLEMS  UNUSUAL RASH Items with * indicate a potential emergency and should be followed up as soon as possible.  Feel free to call the clinic you have any questions or concerns. The clinic phone number is [phone].

## 2014-03-13 ENCOUNTER — Encounter: Payer: Self-pay | Admitting: Hematology and Oncology

## 2014-03-13 ENCOUNTER — Telehealth: Payer: Self-pay | Admitting: Hematology and Oncology

## 2014-03-13 ENCOUNTER — Other Ambulatory Visit (HOSPITAL_BASED_OUTPATIENT_CLINIC_OR_DEPARTMENT_OTHER): Payer: Medicare Other

## 2014-03-13 ENCOUNTER — Ambulatory Visit: Payer: Medicare Other

## 2014-03-13 ENCOUNTER — Ambulatory Visit (HOSPITAL_BASED_OUTPATIENT_CLINIC_OR_DEPARTMENT_OTHER): Payer: Medicare Other | Admitting: Hematology and Oncology

## 2014-03-13 VITALS — BP 142/43 | HR 69 | Temp 97.1°F | Resp 18 | Ht 60.0 in | Wt 114.0 lb

## 2014-03-13 DIAGNOSIS — D72819 Decreased white blood cell count, unspecified: Secondary | ICD-10-CM

## 2014-03-13 DIAGNOSIS — C9110 Chronic lymphocytic leukemia of B-cell type not having achieved remission: Secondary | ICD-10-CM

## 2014-03-13 DIAGNOSIS — D696 Thrombocytopenia, unspecified: Secondary | ICD-10-CM

## 2014-03-13 DIAGNOSIS — D649 Anemia, unspecified: Secondary | ICD-10-CM

## 2014-03-13 LAB — CBC & DIFF AND RETIC
BASO%: 1.7 % (ref 0.0–2.0)
BASOS ABS: 0 10*3/uL (ref 0.0–0.1)
EOS%: 3.9 % (ref 0.0–7.0)
Eosinophils Absolute: 0.1 10*3/uL (ref 0.0–0.5)
HCT: 32.2 % — ABNORMAL LOW (ref 34.8–46.6)
HGB: 10.7 g/dL — ABNORMAL LOW (ref 11.6–15.9)
Immature Retic Fract: 7.6 % (ref 1.60–10.00)
LYMPH%: 36.5 % (ref 14.0–49.7)
MCH: 32.2 pg (ref 25.1–34.0)
MCHC: 33.2 g/dL (ref 31.5–36.0)
MCV: 97 fL (ref 79.5–101.0)
MONO#: 0.6 10*3/uL (ref 0.1–0.9)
MONO%: 24.9 % — ABNORMAL HIGH (ref 0.0–14.0)
NEUT%: 33 % — AB (ref 38.4–76.8)
NEUTROS ABS: 0.8 10*3/uL — AB (ref 1.5–6.5)
Platelets: 40 10*3/uL — ABNORMAL LOW (ref 145–400)
RBC: 3.32 10*6/uL — ABNORMAL LOW (ref 3.70–5.45)
RDW: 13.3 % (ref 11.2–14.5)
Retic %: 1.96 % (ref 0.70–2.10)
Retic Ct Abs: 65.07 10*3/uL (ref 33.70–90.70)
WBC: 2.3 10*3/uL — ABNORMAL LOW (ref 3.9–10.3)
lymph#: 0.9 10*3/uL (ref 0.9–3.3)

## 2014-03-13 LAB — COMPREHENSIVE METABOLIC PANEL (CC13)
ALT: 14 U/L (ref 0–55)
ANION GAP: 8 meq/L (ref 3–11)
AST: 20 U/L (ref 5–34)
Albumin: 3.9 g/dL (ref 3.5–5.0)
Alkaline Phosphatase: 42 U/L (ref 40–150)
BILIRUBIN TOTAL: 0.69 mg/dL (ref 0.20–1.20)
BUN: 13.9 mg/dL (ref 7.0–26.0)
CO2: 26 meq/L (ref 22–29)
Calcium: 10 mg/dL (ref 8.4–10.4)
Chloride: 108 mEq/L (ref 98–109)
Creatinine: 0.8 mg/dL (ref 0.6–1.1)
Glucose: 92 mg/dl (ref 70–140)
Potassium: 4.5 mEq/L (ref 3.5–5.1)
SODIUM: 142 meq/L (ref 136–145)
TOTAL PROTEIN: 5.6 g/dL — AB (ref 6.4–8.3)

## 2014-03-13 NOTE — Progress Notes (Signed)
[facility] OFFICE PROGRESS NOTE  Patient Care Team: Precious Reel, MD as PCP - General (Internal Medicine) Heath Lark, MD as Consulting Physician (Hematology and Oncology)  DIAGNOSIS: CLL with persistent pancytopenia  SUMMARY OF ONCOLOGIC HISTORY: Oncology History   CLL stage IV, deletion 13q     CLL (chronic lymphocytic leukemia)   12/12/2013 Bone Marrow Biopsy Bone marrow biopsy show significant involvement of CLL   12/13/2013 Procedure Patient had placement of a port   12/14/2013 Imaging CT scan of the chest, abdomen and pelvis showed significant lymphadenopathy and splenomegaly   12/20/2013 - 12/22/2013 Hospital Admission The patient was admitted to the hospital to begin cycle 1 day 1 of chemotherapy due to risk of infusion reaction. She tolerated treatment well without major side effects.   12/20/2013 - 03/13/2014 Chemotherapy started on cycle 1 of Obinutuzumab, chlorambucil and prednisone. All treatment placed on hold on 03/13/2014 due to persistent pancytopenia.    INTERVAL HISTORY: Angelica Sullivan 80 y.o. female returns for further followup. She is doing well overall. The patient denies any recent signs or symptoms of bleeding such as spontaneous epistaxis, hematuria or hematochezia. Recently, she had an accidental injury and had a fall. Apart from some scrapes and bruises, she denies any injuries. She denies new lymphadenopathy. She denies any recent fever, chills, night sweats or abnormal weight loss  I have reviewed the past medical history, past surgical history, social history and family history with the patient and they are unchanged from previous note.  ALLERGIES:  is allergic to bactrim.  MEDICATIONS:  Current Outpatient Prescriptions  Medication Sig Dispense Refill  . acyclovir (ZOVIRAX) 400 MG tablet Take 1 tablet (400 mg total) by mouth daily.  90 tablet  0  . atorvastatin (LIPITOR) 20 MG tablet Take 20 mg by mouth every evening.       . calcium  citrate-vitamin D (CITRACAL+D) 315-200 MG-UNIT per tablet Take 1 tablet by mouth daily.       . Cholecalciferol (VITAMIN D) 400 UNITS capsule Take 400 Units by mouth daily.      . ciprofloxacin (CIPRO) 250 MG tablet Take 500 mg by mouth 2 (two) times daily. Start for any signs of infection.      . diphenhydrAMINE (BENADRYL) 25 mg capsule Take 25 mg by mouth at bedtime as needed for allergies.      . Echinacea 400 MG CAPS Take 1 capsule by mouth 2 (two) times daily.        Marland Kitchen ESTRACE VAGINAL 0.1 MG/GM vaginal cream Place 1 Applicatorful vaginally 2 (two) times a week.       . fish oil-omega-3 fatty acids 1000 MG capsule Take 2 g by mouth at bedtime.       . folic acid (FOLVITE) 683 MCG tablet Take 800 mcg by mouth daily.      Marland Kitchen glucosamine-chondroitin 500-400 MG tablet Take 1 tablet by mouth daily.        Marland Kitchen lidocaine-prilocaine (EMLA) cream Apply 1 application topically as needed (port access.).      Marland Kitchen loratadine (CLARITIN) 10 MG tablet Take 10 mg by mouth daily.        . metoprolol succinate (TOPROL-XL) 25 MG 24 hr tablet Take 12.5 mg by mouth every morning.       . montelukast (SINGULAIR) 10 MG tablet Take 10 mg by mouth daily.       . Multiple Vitamin (MULTIVITAMIN) tablet Take 1 tablet by mouth daily.        Marland Kitchen  ondansetron (ZOFRAN) 8 MG tablet Take 1 tablet (8 mg total) by mouth every 8 (eight) hours as needed for nausea.  30 tablet  3  . polyvinyl alcohol (ARTIFICIAL TEARS) 1.4 % ophthalmic solution Place 1 drop into both eyes 3 (three) times daily as needed for dry eyes.       . sodium chloride (OCEAN) 0.65 % SOLN nasal spray Place 1 spray into both nostrils 2 (two) times daily.       Marland Kitchen tiZANidine (ZANAFLEX) 4 MG tablet Take 4 mg by mouth 3 (three) times a week.      . zolpidem (AMBIEN) 5 MG tablet Take 5 mg by mouth at bedtime as needed for sleep.        No current facility-administered medications for this visit.    REVIEW OF SYSTEMS:   Constitutional: Denies fevers, chills or abnormal  weight loss Eyes: Denies blurriness of vision Ears, nose, mouth, throat, and face: Denies mucositis or sore throat Respiratory: Denies cough, dyspnea or wheezes Cardiovascular: Denies palpitation, chest discomfort or lower extremity swelling Gastrointestinal:  Denies nausea, heartburn or change in bowel habits Skin: Denies abnormal skin rashes. She had chronic bruising Lymphatics: Denies new lymphadenopathy Neurological:Denies numbness, tingling or new weaknesses Behavioral/Psych: Mood is stable, no new changes  All other systems were reviewed with the patient and are negative.  PHYSICAL EXAMINATION: ECOG PERFORMANCE STATUS: 0 - Asymptomatic  Filed Vitals:   03/13/14 1002  BP: 142/43  Pulse: 69  Temp: 97.1 F (36.2 C)  Resp: 18   Filed Weights   03/13/14 1002  Weight: 114 lb (51.71 kg)    GENERAL:alert, no distress and comfortable SKIN: skin color, texture, turgor are normal, no rashes or significant lesions. Noticed some old bruising EYES: normal, Conjunctiva are pink and non-injected, sclera clear OROPHARYNX:no exudate, no erythema and lips, buccal mucosa, and tongue normal  NECK: supple, thyroid normal size, non-tender, without nodularity LYMPH:  no palpable lymphadenopathy in the cervical, axillary or inguinal LUNGS: clear to auscultation and percussion with normal breathing effort HEART: regular rate & rhythm and no murmurs and no lower extremity edema ABDOMEN:abdomen soft, non-tender and normal bowel sounds Musculoskeletal:no cyanosis of digits and no clubbing  NEURO: alert & oriented x 3 with fluent speech, no focal motor/sensory deficits  LABORATORY DATA:  I have reviewed the data as listed    Component Value Date/Time   NA 142 03/13/2014 0951   NA 141 12/22/2013 0530   K 4.5 03/13/2014 0951   K 4.0 12/22/2013 0530   CL 107 12/22/2013 0530   CL 99 04/08/2013 0922   CO2 26 03/13/2014 0951   CO2 22 12/22/2013 0530   GLUCOSE 92 03/13/2014 0951   GLUCOSE 115* 12/22/2013  0530   GLUCOSE 92 04/08/2013 0922   BUN 13.9 03/13/2014 0951   BUN 26* 12/22/2013 0530   CREATININE 0.8 03/13/2014 0951   CREATININE 0.87 12/22/2013 0530   CALCIUM 10.0 03/13/2014 0951   CALCIUM 8.9 12/22/2013 0530   PROT 5.6* 03/13/2014 0951   PROT 5.1* 12/22/2013 0530   ALBUMIN 3.9 03/13/2014 0951   ALBUMIN 3.3* 12/22/2013 0530   AST 20 03/13/2014 0951   AST 49* 12/22/2013 0530   ALT 14 03/13/2014 0951   ALT 105* 12/22/2013 0530   ALKPHOS 42 03/13/2014 0951   ALKPHOS 49 12/22/2013 0530   BILITOT 0.69 03/13/2014 0951   BILITOT 0.7 12/22/2013 0530   GFRNONAA 62* 12/22/2013 0530   GFRAA 72* 12/22/2013 0530    No results found  for this basename: SPEP,  UPEP,   kappa and lambda light chains    Lab Results  Component Value Date   WBC 2.3* 03/13/2014   NEUTROABS 0.8* 03/13/2014   HGB 10.7* 03/13/2014   HCT 32.2* 03/13/2014   MCV 97.0 03/13/2014   PLT 40* 03/13/2014      Chemistry      Component Value Date/Time   NA 142 03/13/2014 0951   NA 141 12/22/2013 0530   K 4.5 03/13/2014 0951   K 4.0 12/22/2013 0530   CL 107 12/22/2013 0530   CL 99 04/08/2013 0922   CO2 26 03/13/2014 0951   CO2 22 12/22/2013 0530   BUN 13.9 03/13/2014 0951   BUN 26* 12/22/2013 0530   CREATININE 0.8 03/13/2014 0951   CREATININE 0.87 12/22/2013 0530      Component Value Date/Time   CALCIUM 10.0 03/13/2014 0951   CALCIUM 8.9 12/22/2013 0530   ALKPHOS 42 03/13/2014 0951   ALKPHOS 49 12/22/2013 0530   AST 20 03/13/2014 0951   AST 49* 12/22/2013 0530   ALT 14 03/13/2014 0951   ALT 105* 12/22/2013 0530   BILITOT 0.69 03/13/2014 0951   BILITOT 0.7 12/22/2013 0530     ASSESSMENT & PLAN:  #1 CLL Her blood counts are still too low. I told her to continue holding off taking chlorambucil. I am concerned about persistent pancytopenia. I will hold her infusion today and I plan to order a CT scan for evaluation. The patient will be going out of town. I plan to order a CT scan after she returns and reassess response to treatment at the end of  this month. #2 anemia This is likely anemia of chronic disease. The patient denies recent history of bleeding such as epistaxis, hematuria or hematochezia. She is asymptomatic from the anemia. We will observe for now.  She does not require transfusion now.  #3 thrombocytopenia This is due to underlying disease. It is improving. #4 leukopenia This is likely due to recent treatment. The patient denies recent history of fevers, cough, chills, diarrhea or dysuria. She is asymptomatic from the leukopenia. I will observe for now.  #5 antimicrobial prophylaxis She will continue acyclovir   Orders Placed This Encounter  Procedures  . CT Abdomen Pelvis W Contrast    Standing Status: Future     Number of Occurrences:      Standing Expiration Date: 06/13/2015    Order Specific Question:  Reason for Exam (SYMPTOM  OR DIAGNOSIS REQUIRED)    Answer:  staging CLL assess response to Rx    Order Specific Question:  Preferred imaging location?    Answer:  [facility]  . CT Chest W Contrast    Standing Status: Future     Number of Occurrences:      Standing Expiration Date: 06/13/2015    Order Specific Question:  Reason for Exam (SYMPTOM  OR DIAGNOSIS REQUIRED)    Answer:  staging lymphoma, assess response to Rx    Order Specific Question:  Preferred imaging location?    Answer:  [facility]   All questions were answered. The patient knows to call the clinic with any problems, questions or concerns. No barriers to learning was detected.    Heath Lark, MD 03/13/2014 12:50 PM

## 2014-03-13 NOTE — Telephone Encounter (Signed)
gv pt appt schedule for may. central will call pt w/ct appt. pt aware.

## 2014-03-28 ENCOUNTER — Encounter (HOSPITAL_COMMUNITY): Payer: Self-pay

## 2014-03-28 ENCOUNTER — Ambulatory Visit (HOSPITAL_COMMUNITY)
Admission: RE | Admit: 2014-03-28 | Discharge: 2014-03-28 | Disposition: A | Discharge status: Home / Self Care | Payer: Medicare Other | Source: Ambulatory Visit | Attending: Hematology and Oncology | Admitting: Hematology and Oncology

## 2014-03-28 ENCOUNTER — Other Ambulatory Visit (HOSPITAL_BASED_OUTPATIENT_CLINIC_OR_DEPARTMENT_OTHER): Payer: Medicare Other

## 2014-03-28 DIAGNOSIS — R599 Enlarged lymph nodes, unspecified: Secondary | ICD-10-CM | POA: Insufficient documentation

## 2014-03-28 DIAGNOSIS — C9110 Chronic lymphocytic leukemia of B-cell type not having achieved remission: Secondary | ICD-10-CM | POA: Insufficient documentation

## 2014-03-28 LAB — COMPREHENSIVE METABOLIC PANEL (CC13)
ALT: 15 U/L (ref 0–55)
AST: 23 U/L (ref 5–34)
Albumin: 4.3 g/dL (ref 3.5–5.0)
Alkaline Phosphatase: 49 U/L (ref 40–150)
Anion Gap: 12 mEq/L — ABNORMAL HIGH (ref 3–11)
BUN: 17.5 mg/dL (ref 7.0–26.0)
CHLORIDE: 105 meq/L (ref 98–109)
CO2: 25 mEq/L (ref 22–29)
CREATININE: 0.8 mg/dL (ref 0.6–1.1)
Calcium: 10.4 mg/dL (ref 8.4–10.4)
GLUCOSE: 94 mg/dL (ref 70–140)
Potassium: 5.2 mEq/L — ABNORMAL HIGH (ref 3.5–5.1)
Sodium: 142 mEq/L (ref 136–145)
Total Bilirubin: 0.62 mg/dL (ref 0.20–1.20)
Total Protein: 6.4 g/dL (ref 6.4–8.3)

## 2014-03-28 LAB — CBC & DIFF AND RETIC
BASO%: 1.4 % (ref 0.0–2.0)
BASOS ABS: 0.1 10*3/uL (ref 0.0–0.1)
EOS%: 2.1 % (ref 0.0–7.0)
Eosinophils Absolute: 0.1 10*3/uL (ref 0.0–0.5)
HCT: 36.4 % (ref 34.8–46.6)
HEMOGLOBIN: 12.1 g/dL (ref 11.6–15.9)
Immature Retic Fract: 3.2 % (ref 1.60–10.00)
LYMPH%: 30 % (ref 14.0–49.7)
MCH: 31.6 pg (ref 25.1–34.0)
MCHC: 33.2 g/dL (ref 31.5–36.0)
MCV: 95 fL (ref 79.5–101.0)
MONO#: 0.7 10*3/uL (ref 0.1–0.9)
MONO%: 16.7 % — AB (ref 0.0–14.0)
NEUT#: 2.2 10*3/uL (ref 1.5–6.5)
NEUT%: 49.8 % (ref 38.4–76.8)
Platelets: 59 10*3/uL — ABNORMAL LOW (ref 145–400)
RBC: 3.83 10*6/uL (ref 3.70–5.45)
RDW: 12.8 % (ref 11.2–14.5)
Retic %: 1.5 % (ref 0.70–2.10)
Retic Ct Abs: 57.45 10*3/uL (ref 33.70–90.70)
WBC: 4.4 10*3/uL (ref 3.9–10.3)
lymph#: 1.3 10*3/uL (ref 0.9–3.3)

## 2014-03-28 MED ORDER — IOHEXOL 300 MG/ML  SOLN
100.0000 mL | Freq: Once | INTRAMUSCULAR | Status: AC | PRN
  Administered 2014-03-28: 100 mL via INTRAVENOUS

## 2014-03-30 ENCOUNTER — Encounter: Payer: Self-pay | Admitting: Hematology and Oncology

## 2014-03-30 ENCOUNTER — Ambulatory Visit (HOSPITAL_BASED_OUTPATIENT_CLINIC_OR_DEPARTMENT_OTHER): Payer: Medicare Other | Admitting: Hematology and Oncology

## 2014-03-30 ENCOUNTER — Telehealth: Payer: Self-pay | Admitting: Hematology and Oncology

## 2014-03-30 VITALS — BP 118/35 | HR 68 | Temp 97.8°F | Resp 18 | Ht 60.0 in | Wt 114.0 lb

## 2014-03-30 DIAGNOSIS — D649 Anemia, unspecified: Secondary | ICD-10-CM

## 2014-03-30 DIAGNOSIS — M549 Dorsalgia, unspecified: Secondary | ICD-10-CM | POA: Insufficient documentation

## 2014-03-30 DIAGNOSIS — C9110 Chronic lymphocytic leukemia of B-cell type not having achieved remission: Secondary | ICD-10-CM

## 2014-03-30 DIAGNOSIS — D72819 Decreased white blood cell count, unspecified: Secondary | ICD-10-CM

## 2014-03-30 DIAGNOSIS — L989 Disorder of the skin and subcutaneous tissue, unspecified: Secondary | ICD-10-CM

## 2014-03-30 DIAGNOSIS — D696 Thrombocytopenia, unspecified: Secondary | ICD-10-CM

## 2014-03-30 MED ORDER — ACYCLOVIR 400 MG PO TABS: 400.0000 mg | ORAL_TABLET | Freq: Every day | ORAL | Status: DC

## 2014-03-30 NOTE — Assessment & Plan Note (Signed)
She has a lesion on her face that is irritating her. Examination is benign. I recommend dermatology followup but recommend holding off major skin surgeries if possible due to thrombocytopenia.

## 2014-03-30 NOTE — Telephone Encounter (Signed)
gv adn printed appt sched and avs for pt for June....sed added tx. °

## 2014-03-30 NOTE — Assessment & Plan Note (Signed)
I recommend conservative management. She continues to take calcium with vitamin D supplements.

## 2014-03-30 NOTE — Assessment & Plan Note (Signed)
This has resolved. Continue close monitoring of her blood count.

## 2014-03-30 NOTE — Assessment & Plan Note (Signed)
This is improving with reduction of splenomegaly and improve disease control. I will resume her treatment next week. She has no signs and symptoms of bleeding.

## 2014-03-30 NOTE — Assessment & Plan Note (Signed)
Anemia has resolved. Continue close monitoring.

## 2014-03-30 NOTE — Progress Notes (Signed)
[facility] OFF[facility] PROGRESS NOTE  Patient Care Team: Precious Reel, MD as PCP - General (Internal Medicine) Heath Lark, MD as Consulting Physician (Hematology and Oncology)  SUMMARY OF ONCOLOGIC HISTORY: Oncology History   CLL stage IV, deletion 13q     CLL (chronic lymphocytic leukemia)   12/12/2013 Bone Marrow Biopsy Bone marrow biopsy show significant involvement of CLL   12/13/2013 Procedure Patient had placement of a port   12/14/2013 Imaging CT scan of the chest, abdomen and pelvis showed significant lymphadenopathy and splenomegaly   12/20/2013 - 12/22/2013 Hospital Admission The patient was admitted to the hospital to begin cycle 1 day 1 of chemotherapy due to risk of infusion reaction. She tolerated treatment well without major side effects.   12/20/2013 - 03/13/2014 Chemotherapy started on cycle 1 of Obinutuzumab, chlorambucil and prednisone. All treatment placed on hold on 03/13/2014 due to persistent pancytopenia.   03/28/2014 Imaging Repeat imaging study showed excellent response to treatment.    INTERVAL HISTORY: Please see below for problem oriented charting. She is here to review test results. In the meantime she feels well. Her only complaints of back pain and skin lesions on her face.  REVIEW OF SYSTEMS:   Constitutional: Denies fevers, chills or abnormal weight loss Eyes: Denies blurriness of vision Ears, nose, mouth, throat, and face: Denies mucositis or sore throat Respiratory: Denies cough, dyspnea or wheezes Cardiovascular: Denies palpitation, chest discomfort or lower extremity swelling Gastrointestinal:  Denies nausea, heartburn or change in bowel habits Lymphatics: Denies new lymphadenopathy or easy bruising Neurological:Denies numbness, tingling or new weaknesses Behavioral/Psych: Mood is stable, no new changes  All other systems were reviewed with the patient and are negative.  I have reviewed the past medical history, past surgical history, social  history and family history with the patient and they are unchanged from previous note.  ALLERGIES:  is allergic to bactrim.  MEDICATIONS:  Current Outpatient Prescriptions  Medication Sig Dispense Refill  . acyclovir (ZOVIRAX) 400 MG tablet Take 1 tablet (400 mg total) by mouth daily.  90 tablet  3  . atorvastatin (LIPITOR) 20 MG tablet Take 20 mg by mouth every evening.       . calcium citrate-vitamin D (CITRACAL+D) 315-200 MG-UNIT per tablet Take 1 tablet by mouth daily.       . Cholecalciferol (VITAMIN D) 400 UNITS capsule Take 400 Units by mouth daily.      . diphenhydrAMINE (BENADRYL) 25 mg capsule Take 25 mg by mouth at bedtime as needed for allergies.      . Echinacea 400 MG CAPS Take 1 capsule by mouth 2 (two) times daily.        Marland Kitchen ESTRACE VAGINAL 0.1 MG/GM vaginal cream Place 1 Applicatorful vaginally 2 (two) times a week.       . folic acid (FOLVITE) 035 MCG tablet Take 800 mcg by mouth daily.      Marland Kitchen glucosamine-chondroitin 500-400 MG tablet Take 1 tablet by mouth daily.        Marland Kitchen lidocaine-prilocaine (EMLA) cream Apply 1 application topically as needed (port access.).      Marland Kitchen loratadine (CLARITIN) 10 MG tablet Take 10 mg by mouth daily.        . metoprolol succinate (TOPROL-XL) 25 MG 24 hr tablet Take 12.5 mg by mouth every morning.       . montelukast (SINGULAIR) 10 MG tablet Take 10 mg by mouth daily.       . Multiple Vitamin (MULTIVITAMIN) tablet Take 1  tablet by mouth daily.        . ondansetron (ZOFRAN) 8 MG tablet Take 1 tablet (8 mg total) by mouth every 8 (eight) hours as needed for nausea.  30 tablet  3  . polyvinyl alcohol (ARTIFICIAL TEARS) 1.4 % ophthalmic solution Place 1 drop into both eyes 3 (three) times daily as needed for dry eyes.       . sodium chloride (OCEAN) 0.65 % SOLN nasal spray Place 1 spray into both nostrils 2 (two) times daily.       Marland Kitchen tiZANidine (ZANAFLEX) 4 MG tablet Take 4 mg by mouth 3 (three) times a week.      . zolpidem (AMBIEN) 5 MG tablet Take  5 mg by mouth at bedtime as needed for sleep.        No current facility-administered medications for this visit.    PHYSICAL EXAMINATION: ECOG PERFORMANCE STATUS: 0 - Asymptomatic  Filed Vitals:   03/30/14 0834  BP: 118/35  Pulse: 68  Temp: 97.8 F (36.6 C)  Resp: 18   Filed Weights   03/30/14 0834  Weight: 114 lb (51.71 kg)    GENERAL:alert, no distress and comfortable. She looks thin but not cachectic SKIN: Multiple solar keratosis. No rashes. EYES: normal, Conjunctiva are pink and non-injected, sclera clear OROPHARYNX:no exudate, no erythema and lips, buccal mucosa, and tongue normal  NECK: supple, thyroid normal size, non-tender, without nodularity LYMPH:  no palpable lymphadenopathy in the cervical, axillary or inguinal LUNGS: clear to auscultation and percussion with normal breathing effort HEART: regular rate & rhythm and no murmurs and no lower extremity edema ABDOMEN:abdomen soft, non-tender and normal bowel sounds Musculoskeletal:no cyanosis of digits and no clubbing  NEURO: alert & oriented x 3 with fluent speech, no focal motor/sensory deficits  LABORATORY DATA:  I have reviewed the data as listed    Component Value Date/Time   NA 142 03/28/2014 1329   NA 141 12/22/2013 0530   K 5.2* 03/28/2014 1329   K 4.0 12/22/2013 0530   CL 107 12/22/2013 0530   CL 99 04/08/2013 0922   CO2 25 03/28/2014 1329   CO2 22 12/22/2013 0530   GLUCOSE 94 03/28/2014 1329   GLUCOSE 115* 12/22/2013 0530   GLUCOSE 92 04/08/2013 0922   BUN 17.5 03/28/2014 1329   BUN 26* 12/22/2013 0530   CREATININE 0.8 03/28/2014 1329   CREATININE 0.87 12/22/2013 0530   CALCIUM 10.4 03/28/2014 1329   CALCIUM 8.9 12/22/2013 0530   PROT 6.4 03/28/2014 1329   PROT 5.1* 12/22/2013 0530   ALBUMIN 4.3 03/28/2014 1329   ALBUMIN 3.3* 12/22/2013 0530   AST 23 03/28/2014 1329   AST 49* 12/22/2013 0530   ALT 15 03/28/2014 1329   ALT 105* 12/22/2013 0530   ALKPHOS 49 03/28/2014 1329   ALKPHOS 49 12/22/2013 0530   BILITOT  0.62 03/28/2014 1329   BILITOT 0.7 12/22/2013 0530   GFRNONAA 62* 12/22/2013 0530   GFRAA 72* 12/22/2013 0530    No results found for this basename: SPEP, UPEP,  kappa and lambda light chains    Lab Results  Component Value Date   WBC 4.4 03/28/2014   NEUTROABS 2.2 03/28/2014   HGB 12.1 03/28/2014   HCT 36.4 03/28/2014   MCV 95.0 03/28/2014   PLT 59* 03/28/2014      Chemistry      Component Value Date/Time   NA 142 03/28/2014 1329   NA 141 12/22/2013 0530   K 5.2* 03/28/2014 1329   K  4.0 12/22/2013 0530   CL 107 12/22/2013 0530   CL 99 04/08/2013 0922   CO2 25 03/28/2014 1329   CO2 22 12/22/2013 0530   BUN 17.5 03/28/2014 1329   BUN 26* 12/22/2013 0530   CREATININE 0.8 03/28/2014 1329   CREATININE 0.87 12/22/2013 0530      Component Value Date/Time   CALCIUM 10.4 03/28/2014 1329   CALCIUM 8.9 12/22/2013 0530   ALKPHOS 49 03/28/2014 1329   ALKPHOS 49 12/22/2013 0530   AST 23 03/28/2014 1329   AST 49* 12/22/2013 0530   ALT 15 03/28/2014 1329   ALT 105* 12/22/2013 0530   BILITOT 0.62 03/28/2014 1329   BILITOT 0.7 12/22/2013 0530       RADIOGRAPHIC STUDIES: I have personally reviewed the radiological images as listed and agreed with the findings in the report. Ct Chest W Contrast  03/28/2014   CLINICAL DATA:  Subsequent treatment therapy for chronic lymphocytic leukemia  EXAM: CT CHEST, ABDOMEN, AND PELVIS WITH CONTRAST  TECHNIQUE: Multidetector CT imaging of the chest, abdomen and pelvis was performed following the standard protocol during bolus administration of intravenous contrast.  CONTRAST:  124m OMNIPAQUE IOHEXOL 300 MG/ML  SOLN  COMPARISON:  CT 12/14/2013, 08/01/2013  FINDINGS: CT CHEST FINDINGS  There is a port in the right anterior chest wall. Mild axillary lymphadenopathy. Example right axial lymph node measures 10 mm short axis (image 10, series 2) compared to 12 mm on prior.  There is no supraclavicular or mediastinal lymphadenopathy. No pericardial fluid. Esophagus is normal. There is  a calcified lower paratracheal lymph node. No central pulmonary embolism.  No suspicious pulmonary nodules. Ground -glass opacity in the right upper lobe is poorly defined (image 11, series 4) and not significant changed from prior. FBurtis Junesthis to represent benign finding.  CT ABDOMEN AND PELVIS FINDINGS  No focal hepatic lesion. The gallbladder is surgically absent. The pancreas is normal. The volume of the spleen is decreased in the interval measuring 10 cm in craniocaudad dimension compared to 15.7 cm on prior. Adrenal glands and kidneys are normal.  Stomach, small bowel, and cecum are normal. The colon and rectosigmoid colon are normal.  Abdominal or is normal caliber. There is moderate para-aortic retroperitoneal lymphadenopathy which is similar prior. Lymph node left of the aorta measures 15 mm short axis (image 59, series 2) compared to 14 mm on prior. Lymph node position between the IVC in the aorta measures 13 mm short axis compared 20 mm on prior (image 74, series 2). There are lymph nodes within the mesentery which are slightly decreased compared to prior. Example lymph node is near the terminal ileum measures 13 mm compared to 17 mm on prior.  There is no pelvic lymphadenopathy. No inguinal adenopathy. There is a right external iliac lymph node measuring 10 mm short axis decreased from 12 mm on prior.  Post hysterectomy anatomy. The bladder is normal. No pelvic lymphadenopathy. Diffuse sclerosis throughout the spine is unchanged from prior. .  IMPRESSION: Chest:  1. Bilateral mild axillary lymphadenopathy is not significant changed from prior. 2. No evidence of pulmonary metastasis. Stable ground-glass opacity in the right upper lobe.  Abdomen and pelvis:  1. Reduction in volume of spleen which is now normal volume. 2. Persistent retroperitoneal and mesenteric lymphadenopathy which is decreased in volume compared to prior. 3. Mild pelvic lymphadenopathy is also decreased compared to prior. 4. No new or  progressive lymphadenopathy in abdomen pelvis. 5. Diffuse sclerosis within the bones unchanged from prior.  Electronically Signed   By: Suzy Bouchard M.D.   On: 03/28/2014 15:54   Ct Abdomen Pelvis W Contrast  03/28/2014   CLINICAL DATA:  Subsequent treatment therapy for chronic lymphocytic leukemia  EXAM: CT CHEST, ABDOMEN, AND PELVIS WITH CONTRAST  TECHNIQUE: Multidetector CT imaging of the chest, abdomen and pelvis was performed following the standard protocol during bolus administration of intravenous contrast.  CONTRAST:  126m OMNIPAQUE IOHEXOL 300 MG/ML  SOLN  COMPARISON:  CT 12/14/2013, 08/01/2013  FINDINGS: CT CHEST FINDINGS  There is a port in the right anterior chest wall. Mild axillary lymphadenopathy. Example right axial lymph node measures 10 mm short axis (image 10, series 2) compared to 12 mm on prior.  There is no supraclavicular or mediastinal lymphadenopathy. No pericardial fluid. Esophagus is normal. There is a calcified lower paratracheal lymph node. No central pulmonary embolism.  No suspicious pulmonary nodules. Ground -glass opacity in the right upper lobe is poorly defined (image 11, series 4) and not significant changed from prior. FBurtis Junesthis to represent benign finding.  CT ABDOMEN AND PELVIS FINDINGS  No focal hepatic lesion. The gallbladder is surgically absent. The pancreas is normal. The volume of the spleen is decreased in the interval measuring 10 cm in craniocaudad dimension compared to 15.7 cm on prior. Adrenal glands and kidneys are normal.  Stomach, small bowel, and cecum are normal. The colon and rectosigmoid colon are normal.  Abdominal or is normal caliber. There is moderate para-aortic retroperitoneal lymphadenopathy which is similar prior. Lymph node left of the aorta measures 15 mm short axis (image 59, series 2) compared to 14 mm on prior. Lymph node position between the IVC in the aorta measures 13 mm short axis compared 20 mm on prior (image 74, series 2). There  are lymph nodes within the mesentery which are slightly decreased compared to prior. Example lymph node is near the terminal ileum measures 13 mm compared to 17 mm on prior.  There is no pelvic lymphadenopathy. No inguinal adenopathy. There is a right external iliac lymph node measuring 10 mm short axis decreased from 12 mm on prior.  Post hysterectomy anatomy. The bladder is normal. No pelvic lymphadenopathy. Diffuse sclerosis throughout the spine is unchanged from prior. .  IMPRESSION: Chest:  1. Bilateral mild axillary lymphadenopathy is not significant changed from prior. 2. No evidence of pulmonary metastasis. Stable ground-glass opacity in the right upper lobe.  Abdomen and pelvis:  1. Reduction in volume of spleen which is now normal volume. 2. Persistent retroperitoneal and mesenteric lymphadenopathy which is decreased in volume compared to prior. 3. Mild pelvic lymphadenopathy is also decreased compared to prior. 4. No new or progressive lymphadenopathy in abdomen pelvis. 5. Diffuse sclerosis within the bones unchanged from prior.   Electronically Signed   By: SSuzy BouchardM.D.   On: 03/28/2014 15:54     ASSESSMENT & PLAN:  CLL (chronic lymphocytic leukemia) Imaging studies show excellent response to treatment. I recommend we resume her chemotherapy next week. Due to improvement of her blood count, I am also starting her back on chlorambucil 4 mg by mouth tablet once every other week.  Leukopenia This has resolved. Continue close monitoring of her blood count.  Anemia, unspecified Anemia has resolved. Continue close monitoring.  Thrombocytopenia This is improving with reduction of splenomegaly and improve disease control. I will resume her treatment next week. She has no signs and symptoms of bleeding.  Skin lesion of face She has a lesion on her face that  is irritating her. Examination is benign. I recommend dermatology followup but recommend holding off major skin surgeries if  possible due to thrombocytopenia.  Back pain I recommend conservative management. She continues to take calcium with vitamin D supplements.   All questions were answered. The patient knows to call the clinic with any problems, questions or concerns. No barriers to learning was detected.   Heath Lark, MD 03/30/2014 9:40 AM

## 2014-03-30 NOTE — Assessment & Plan Note (Signed)
Imaging studies show excellent response to treatment. I recommend we resume her chemotherapy next week. Due to improvement of her blood count, I am also starting her back on chlorambucil 4 mg by mouth tablet once every other week.

## 2014-04-04 ENCOUNTER — Other Ambulatory Visit: Payer: Self-pay | Admitting: Hematology and Oncology

## 2014-04-04 ENCOUNTER — Ambulatory Visit (HOSPITAL_BASED_OUTPATIENT_CLINIC_OR_DEPARTMENT_OTHER): Payer: Medicare Other

## 2014-04-04 VITALS — BP 148/59 | HR 78 | Temp 97.6°F | Resp 18

## 2014-04-04 DIAGNOSIS — Z5112 Encounter for antineoplastic immunotherapy: Secondary | ICD-10-CM

## 2014-04-04 DIAGNOSIS — C9110 Chronic lymphocytic leukemia of B-cell type not having achieved remission: Secondary | ICD-10-CM

## 2014-04-04 MED ORDER — SODIUM CHLORIDE 0.9 % IJ SOLN
10.0000 mL | INTRAMUSCULAR | Status: DC | PRN
  Administered 2014-04-04: 10 mL
  Filled 2014-04-04: qty 10

## 2014-04-04 MED ORDER — SODIUM CHLORIDE 0.9 % IV SOLN
1000.0000 mg | Freq: Once | INTRAVENOUS | Status: AC
  Administered 2014-04-04: 1000 mg via INTRAVENOUS
  Filled 2014-04-04: qty 40

## 2014-04-04 MED ORDER — DIPHENHYDRAMINE HCL 50 MG/ML IJ SOLN
50.0000 mg | Freq: Once | INTRAMUSCULAR | Status: AC
  Administered 2014-04-04: 50 mg via INTRAVENOUS

## 2014-04-04 MED ORDER — DEXAMETHASONE SODIUM PHOSPHATE 20 MG/5ML IJ SOLN
INTRAMUSCULAR | Status: AC
  Filled 2014-04-04: qty 5

## 2014-04-04 MED ORDER — DIPHENHYDRAMINE HCL 50 MG/ML IJ SOLN
INTRAMUSCULAR | Status: AC
Start: 2014-04-04 — End: 2014-04-04
  Filled 2014-04-04: qty 1

## 2014-04-04 MED ORDER — DEXAMETHASONE SODIUM PHOSPHATE 20 MG/5ML IJ SOLN
20.0000 mg | Freq: Once | INTRAMUSCULAR | Status: AC
  Administered 2014-04-04: 20 mg via INTRAVENOUS

## 2014-04-04 MED ORDER — ACETAMINOPHEN 325 MG PO TABS
650.0000 mg | ORAL_TABLET | Freq: Once | ORAL | Status: AC
  Administered 2014-04-04: 650 mg via ORAL

## 2014-04-04 MED ORDER — SODIUM CHLORIDE 0.9 % IV SOLN
Freq: Once | INTRAVENOUS | Status: AC
  Administered 2014-04-04: 12:00:00 via INTRAVENOUS

## 2014-04-04 MED ORDER — ACETAMINOPHEN 325 MG PO TABS
ORAL_TABLET | ORAL | Status: AC
  Filled 2014-04-04: qty 2

## 2014-04-04 MED ORDER — HEPARIN SOD (PORK) LOCK FLUSH 100 UNIT/ML IV SOLN
500.0000 [IU] | Freq: Once | INTRAVENOUS | Status: AC | PRN
  Administered 2014-04-04: 500 [IU]
  Filled 2014-04-04: qty 5

## 2014-04-04 NOTE — Patient Instructions (Signed)
[facility]cer Center Discharge Instructions for Patients Receiving Chemotherapy  Today you received the following chemotherapy agents: Gazyva.  To help prevent nausea and vomiting after your treatment, we encourage you to take your nausea medication as prescribed.   If you develop nausea and vomiting that is not controlled by your nausea medication, call the clinic.   BELOW ARE SYMPTOMS THAT SHOULD BE REPORTED IMMEDIATELY:  *FEVER GREATER THAN 100.5 F  *CHILLS WITH OR WITHOUT FEVER  NAUSEA AND VOMITING THAT IS NOT CONTROLLED WITH YOUR NAUSEA MEDICATION  *UNUSUAL SHORTNESS OF BREATH  *UNUSUAL BRUISING OR BLEEDING  TENDERNESS IN MOUTH AND THROAT WITH OR WITHOUT PRESENCE OF ULCERS  *URINARY PROBLEMS  *BOWEL PROBLEMS  UNUSUAL RASH Items with * indicate a potential emergency and should be followed up as soon as possible.  Feel free to call the clinic you have any questions or concerns. The clinic phone number is [phone].    

## 2014-04-10 ENCOUNTER — Ambulatory Visit: Payer: Medicare Other

## 2014-05-02 ENCOUNTER — Encounter: Payer: Self-pay | Admitting: Hematology and Oncology

## 2014-05-02 ENCOUNTER — Other Ambulatory Visit (HOSPITAL_BASED_OUTPATIENT_CLINIC_OR_DEPARTMENT_OTHER): Payer: Medicare Other

## 2014-05-02 ENCOUNTER — Telehealth: Payer: Self-pay | Admitting: Hematology and Oncology

## 2014-05-02 ENCOUNTER — Ambulatory Visit (HOSPITAL_BASED_OUTPATIENT_CLINIC_OR_DEPARTMENT_OTHER): Payer: Medicare Other | Admitting: Hematology and Oncology

## 2014-05-02 ENCOUNTER — Ambulatory Visit (HOSPITAL_BASED_OUTPATIENT_CLINIC_OR_DEPARTMENT_OTHER): Payer: Medicare Other

## 2014-05-02 VITALS — BP 137/49 | HR 69 | Temp 97.5°F | Resp 18 | Ht 60.0 in | Wt 114.8 lb

## 2014-05-02 VITALS — BP 135/52 | HR 64 | Temp 97.2°F | Resp 18

## 2014-05-02 DIAGNOSIS — D72819 Decreased white blood cell count, unspecified: Secondary | ICD-10-CM

## 2014-05-02 DIAGNOSIS — D696 Thrombocytopenia, unspecified: Secondary | ICD-10-CM

## 2014-05-02 DIAGNOSIS — I952 Hypotension due to drugs: Secondary | ICD-10-CM

## 2014-05-02 DIAGNOSIS — C9110 Chronic lymphocytic leukemia of B-cell type not having achieved remission: Secondary | ICD-10-CM

## 2014-05-02 DIAGNOSIS — I959 Hypotension, unspecified: Secondary | ICD-10-CM | POA: Insufficient documentation

## 2014-05-02 DIAGNOSIS — Z5112 Encounter for antineoplastic immunotherapy: Secondary | ICD-10-CM

## 2014-05-02 DIAGNOSIS — D649 Anemia, unspecified: Secondary | ICD-10-CM

## 2014-05-02 DIAGNOSIS — I9589 Other hypotension: Secondary | ICD-10-CM

## 2014-05-02 LAB — CBC & DIFF AND RETIC
BASO%: 1.6 % (ref 0.0–2.0)
Basophils Absolute: 0.1 10*3/uL (ref 0.0–0.1)
EOS ABS: 0.1 10*3/uL (ref 0.0–0.5)
EOS%: 2.8 % (ref 0.0–7.0)
HEMATOCRIT: 37.3 % (ref 34.8–46.6)
HGB: 12.3 g/dL (ref 11.6–15.9)
IMMATURE RETIC FRACT: 6.7 % (ref 1.60–10.00)
LYMPH#: 0.8 10*3/uL — AB (ref 0.9–3.3)
LYMPH%: 26.3 % (ref 14.0–49.7)
MCH: 30 pg (ref 25.1–34.0)
MCHC: 33 g/dL (ref 31.5–36.0)
MCV: 91 fL (ref 79.5–101.0)
MONO#: 0.5 10*3/uL (ref 0.1–0.9)
MONO%: 14.7 % — ABNORMAL HIGH (ref 0.0–14.0)
NEUT%: 54.6 % (ref 38.4–76.8)
NEUTROS ABS: 1.8 10*3/uL (ref 1.5–6.5)
Platelets: 46 10*3/uL — ABNORMAL LOW (ref 145–400)
RBC: 4.1 10*6/uL (ref 3.70–5.45)
RDW: 12.9 % (ref 11.2–14.5)
RETIC %: 1.63 % (ref 0.70–2.10)
Retic Ct Abs: 66.83 10*3/uL (ref 33.70–90.70)
WBC: 3.2 10*3/uL — AB (ref 3.9–10.3)

## 2014-05-02 LAB — COMPREHENSIVE METABOLIC PANEL (CC13)
ALK PHOS: 50 U/L (ref 40–150)
ALT: 14 U/L (ref 0–55)
AST: 17 U/L (ref 5–34)
Albumin: 4 g/dL (ref 3.5–5.0)
Anion Gap: 10 mEq/L (ref 3–11)
BUN: 19.9 mg/dL (ref 7.0–26.0)
CO2: 25 mEq/L (ref 22–29)
Calcium: 10.1 mg/dL (ref 8.4–10.4)
Chloride: 107 mEq/L (ref 98–109)
Creatinine: 0.9 mg/dL (ref 0.6–1.1)
Glucose: 123 mg/dl (ref 70–140)
Potassium: 4.5 mEq/L (ref 3.5–5.1)
SODIUM: 143 meq/L (ref 136–145)
TOTAL PROTEIN: 6 g/dL — AB (ref 6.4–8.3)
Total Bilirubin: 0.49 mg/dL (ref 0.20–1.20)

## 2014-05-02 MED ORDER — DIPHENHYDRAMINE HCL 50 MG/ML IJ SOLN
INTRAMUSCULAR | Status: AC
  Filled 2014-05-02: qty 1

## 2014-05-02 MED ORDER — ACETAMINOPHEN 325 MG PO TABS
650.0000 mg | ORAL_TABLET | Freq: Once | ORAL | Status: AC
  Administered 2014-05-02: 650 mg via ORAL

## 2014-05-02 MED ORDER — DIPHENHYDRAMINE HCL 50 MG/ML IJ SOLN
50.0000 mg | Freq: Once | INTRAMUSCULAR | Status: AC
  Administered 2014-05-02: 50 mg via INTRAVENOUS

## 2014-05-02 MED ORDER — HEPARIN SOD (PORK) LOCK FLUSH 100 UNIT/ML IV SOLN
500.0000 [IU] | Freq: Once | INTRAVENOUS | Status: AC | PRN
  Administered 2014-05-02: 500 [IU]
  Filled 2014-05-02: qty 5

## 2014-05-02 MED ORDER — SODIUM CHLORIDE 0.9 % IV SOLN
1000.0000 mg | Freq: Once | INTRAVENOUS | Status: AC
  Administered 2014-05-02: 1000 mg via INTRAVENOUS
  Filled 2014-05-02: qty 40

## 2014-05-02 MED ORDER — DEXAMETHASONE SODIUM PHOSPHATE 20 MG/5ML IJ SOLN
20.0000 mg | Freq: Once | INTRAMUSCULAR | Status: AC
  Administered 2014-05-02: 20 mg via INTRAVENOUS

## 2014-05-02 MED ORDER — DEXAMETHASONE SODIUM PHOSPHATE 20 MG/5ML IJ SOLN
INTRAMUSCULAR | Status: AC
  Filled 2014-05-02: qty 5

## 2014-05-02 MED ORDER — ACETAMINOPHEN 325 MG PO TABS
ORAL_TABLET | ORAL | Status: AC
  Filled 2014-05-02: qty 2

## 2014-05-02 MED ORDER — SODIUM CHLORIDE 0.9 % IJ SOLN
10.0000 mL | INTRAMUSCULAR | Status: DC | PRN
  Administered 2014-05-02: 10 mL
  Filled 2014-05-02: qty 10

## 2014-05-02 MED ORDER — SODIUM CHLORIDE 0.9 % IV SOLN
Freq: Once | INTRAVENOUS | Status: AC
  Administered 2014-05-02: 10:00:00 via INTRAVENOUS

## 2014-05-02 NOTE — Assessment & Plan Note (Signed)
This is likely due to recent treatment and her underlying disease.. The patient denies recent history of bleeding such as epistaxis, hematuria or hematochezia. She is asymptomatic from the low platelet count. I will observe for now.  she does not require transfusion now. I will continue the chemotherapy at current dose without dosage adjustment except discontinuation of chlorambucil as above..  If the thrombocytopenia gets progressive worse in the future, I might have to delay her treatment or adjust the chemotherapy dose.

## 2014-05-02 NOTE — Telephone Encounter (Signed)
gv adn printed appt shced and avs for pt for July....sed added tx.

## 2014-05-02 NOTE — Progress Notes (Signed)
[facility] OFFICE PROGRESS NOTE  Patient Care Team: Precious Reel, MD as PCP - General (Internal Medicine) Heath Lark, MD as Consulting Physician (Hematology and Oncology)  SUMMARY OF ONCOLOGIC HISTORY: Oncology History   CLL stage IV, deletion 13q     CLL (chronic lymphocytic leukemia)   12/12/2013 Bone Marrow Biopsy Bone marrow biopsy show significant involvement of CLL   12/13/2013 Procedure Patient had placement of a port   12/14/2013 Imaging CT scan of the chest, abdomen and pelvis showed significant lymphadenopathy and splenomegaly   12/20/2013 - 12/22/2013 Hospital Admission The patient was admitted to the hospital to begin cycle 1 day 1 of chemotherapy due to risk of infusion reaction. She tolerated treatment well without major side effects.   12/20/2013 - 03/13/2014 Chemotherapy started on cycle 1 of Obinutuzumab, chlorambucil and prednisone. All treatment placed on hold on 03/13/2014 due to persistent pancytopenia.   03/28/2014 Imaging Repeat imaging study showed excellent response to treatment.    INTERVAL HISTORY: Please see below for problem oriented charting. She is seen prior to cycle 5 of chemotherapy. She denies any recent fever, chills, night sweats or abnormal weight loss Denies new lymphadenopathy. The patient denies any recent signs or symptoms of bleeding such as spontaneous epistaxis, hematuria or hematochezia. She complained of occasional dizziness. REVIEW OF SYSTEMS:   Constitutional: Denies fevers, chills or abnormal weight loss Eyes: Denies blurriness of vision Ears, nose, mouth, throat, and face: Denies mucositis or sore throat Respiratory: Denies cough, dyspnea or wheezes Cardiovascular: Denies palpitation, chest discomfort or lower extremity swelling Gastrointestinal:  Denies nausea, heartburn or change in bowel habits Skin: Denies abnormal skin rashes Lymphatics: Denies new lymphadenopathy or easy bruising Neurological:Denies numbness, tingling  or new weaknesses Behavioral/Psych: Mood is stable, no new changes  All other systems were reviewed with the patient and are negative.  I have reviewed the past medical history, past surgical history, social history and family history with the patient and they are unchanged from previous note.  ALLERGIES:  is allergic to bactrim.  MEDICATIONS:  Current Outpatient Prescriptions  Medication Sig Dispense Refill  . acyclovir (ZOVIRAX) 400 MG tablet Take 1 tablet (400 mg total) by mouth daily.  90 tablet  3  . atorvastatin (LIPITOR) 20 MG tablet Take 20 mg by mouth every evening.       . calcium citrate-vitamin D (CITRACAL+D) 315-200 MG-UNIT per tablet Take 1 tablet by mouth daily.       . Cholecalciferol (VITAMIN D) 400 UNITS capsule Take 400 Units by mouth daily.      . diphenhydrAMINE (BENADRYL) 25 mg capsule Take 25 mg by mouth at bedtime as needed for allergies.      . Echinacea 400 MG CAPS Take 1 capsule by mouth 2 (two) times daily.        Marland Kitchen ESTRACE VAGINAL 0.1 MG/GM vaginal cream Place 1 Applicatorful vaginally 2 (two) times a week.       . folic acid (FOLVITE) 256 MCG tablet Take 800 mcg by mouth daily.      Marland Kitchen glucosamine-chondroitin 500-400 MG tablet Take 1 tablet by mouth daily.        Marland Kitchen lidocaine-prilocaine (EMLA) cream Apply 1 application topically as needed (port access.).      Marland Kitchen loratadine (CLARITIN) 10 MG tablet Take 10 mg by mouth daily.        . metoprolol succinate (TOPROL-XL) 25 MG 24 hr tablet Take 12.5 mg by mouth every morning.       Marland Kitchen  montelukast (SINGULAIR) 10 MG tablet Take 10 mg by mouth daily.       . Multiple Vitamin (MULTIVITAMIN) tablet Take 1 tablet by mouth daily.        . ondansetron (ZOFRAN) 8 MG tablet Take 1 tablet (8 mg total) by mouth every 8 (eight) hours as needed for nausea.  30 tablet  3  . polyvinyl alcohol (ARTIFICIAL TEARS) 1.4 % ophthalmic solution Place 1 drop into both eyes 3 (three) times daily as needed for dry eyes.       . sodium chloride  (OCEAN) 0.65 % SOLN nasal spray Place 1 spray into both nostrils 2 (two) times daily.       Marland Kitchen tiZANidine (ZANAFLEX) 4 MG tablet Take 4 mg by mouth 3 (three) times a week.      . zolpidem (AMBIEN) 5 MG tablet Take 5 mg by mouth at bedtime as needed for sleep.        No current facility-administered medications for this visit.   Facility-Administered Medications Ordered in Other Visits  Medication Dose Route Frequency Provider Last Rate Last Dose  . heparin lock flush 100 unit/mL  500 Units Intracatheter Once PRN Heath Lark, MD      . obinutuzumab (GAZYVA) 1,000 mg in sodium chloride 0.9 % 250 mL chemo infusion  1,000 mg Intravenous Once Heath Lark, MD      . sodium chloride 0.9 % injection 10 mL  10 mL Intracatheter PRN Heath Lark, MD        PHYSICAL EXAMINATION: ECOG PERFORMANCE STATUS: 1 - Symptomatic but completely ambulatory  Filed Vitals:   05/02/14 0840  BP: 137/49  Pulse: 69  Temp: 97.5 F (36.4 C)  Resp: 18   Filed Weights   05/02/14 0840  Weight: 114 lb 12.8 oz (52.073 kg)    GENERAL:alert, no distress and comfortable. She looks thin but not cachectic SKIN: skin color, texture, turgor are normal, no rashes or significant lesions EYES: normal, Conjunctiva are pink and non-injected, sclera clear OROPHARYNX:no exudate, no erythema and lips, buccal mucosa, and tongue normal  NECK: supple, thyroid normal size, non-tender, without nodularity LYMPH:  no palpable lymphadenopathy in the cervical, axillary or inguinal LUNGS: clear to auscultation and percussion with normal breathing effort HEART: regular rate & rhythm and no murmurs and no lower extremity edema ABDOMEN:abdomen soft, non-tender and normal bowel sounds Musculoskeletal:no cyanosis of digits and no clubbing  NEURO: alert & oriented x 3 with fluent speech, no focal motor/sensory deficits  LABORATORY DATA:  I have reviewed the data as listed    Component Value Date/Time   NA 143 05/02/2014 0820   NA 141 12/22/2013  0530   K 4.5 05/02/2014 0820   K 4.0 12/22/2013 0530   CL 107 12/22/2013 0530   CL 99 04/08/2013 0922   CO2 25 05/02/2014 0820   CO2 22 12/22/2013 0530   GLUCOSE 123 05/02/2014 0820   GLUCOSE 115* 12/22/2013 0530   GLUCOSE 92 04/08/2013 0922   BUN 19.9 05/02/2014 0820   BUN 26* 12/22/2013 0530   CREATININE 0.9 05/02/2014 0820   CREATININE 0.87 12/22/2013 0530   CALCIUM 10.1 05/02/2014 0820   CALCIUM 8.9 12/22/2013 0530   PROT 6.0* 05/02/2014 0820   PROT 5.1* 12/22/2013 0530   ALBUMIN 4.0 05/02/2014 0820   ALBUMIN 3.3* 12/22/2013 0530   AST 17 05/02/2014 0820   AST 49* 12/22/2013 0530   ALT 14 05/02/2014 0820   ALT 105* 12/22/2013 0530   ALKPHOS 50 05/02/2014 0820  ALKPHOS 49 12/22/2013 0530   BILITOT 0.49 05/02/2014 0820   BILITOT 0.7 12/22/2013 0530   GFRNONAA 62* 12/22/2013 0530   GFRAA 72* 12/22/2013 0530    No results found for this basename: SPEP, UPEP,  kappa and lambda light chains    Lab Results  Component Value Date   WBC 3.2* 05/02/2014   NEUTROABS 1.8 05/02/2014   HGB 12.3 05/02/2014   HCT 37.3 05/02/2014   MCV 91.0 05/02/2014   PLT 46* 05/02/2014      Chemistry      Component Value Date/Time   NA 143 05/02/2014 0820   NA 141 12/22/2013 0530   K 4.5 05/02/2014 0820   K 4.0 12/22/2013 0530   CL 107 12/22/2013 0530   CL 99 04/08/2013 0922   CO2 25 05/02/2014 0820   CO2 22 12/22/2013 0530   BUN 19.9 05/02/2014 0820   BUN 26* 12/22/2013 0530   CREATININE 0.9 05/02/2014 0820   CREATININE 0.87 12/22/2013 0530      Component Value Date/Time   CALCIUM 10.1 05/02/2014 0820   CALCIUM 8.9 12/22/2013 0530   ALKPHOS 50 05/02/2014 0820   ALKPHOS 49 12/22/2013 0530   AST 17 05/02/2014 0820   AST 49* 12/22/2013 0530   ALT 14 05/02/2014 0820   ALT 105* 12/22/2013 0530   BILITOT 0.49 05/02/2014 0820   BILITOT 0.7 12/22/2013 0530      ASSESSMENT & PLAN:  CLL (chronic lymphocytic leukemia) Overall, she tolerated treatment well. We will proceed with treatment today without delay. I told her to discontinue  chlorambucil due to worsening leukopenia and thrombocytopenia.  Thrombocytopenia This is likely due to recent treatment and her underlying disease.. The patient denies recent history of bleeding such as epistaxis, hematuria or hematochezia. She is asymptomatic from the low platelet count. I will observe for now.  she does not require transfusion now. I will continue the chemotherapy at current dose without dosage adjustment except discontinuation of chlorambucil as above..  If the thrombocytopenia gets progressive worse in the future, I might have to delay her treatment or adjust the chemotherapy dose.    Leukopenia This is likely due to recent treatment. The patient denies recent history of fevers, cough, chills, diarrhea or dysuria. She is asymptomatic from the leukopenia. I will observe for now.  I will continue the chemotherapy at current dose without dosage adjustment apart from discontinuation of chlorambucil as above..  If the leukopenia gets progressive worse in the future, I might have to delay her treatment or adjust the chemotherapy dose.    Hypotension I recommend she contact her primary care provider for possible discontinuation of metoprolol that could have contributed to hypotension and some of her symptoms of dizziness.  All questions were answered. The patient knows to call the clinic with any problems, questions or concerns. No barriers to learning was detected.    GORSUCH, NI, MD 05/02/2014 10:07 AM

## 2014-05-02 NOTE — Patient Instructions (Signed)
[facility] Discharge Instructions for Patients Receiving Chemotherapy  Today you received the following chemotherapy agents Gazyva.  To help prevent nausea and vomiting after your treatment, we encourage you to take your nausea medication as prescribed.   If you develop nausea and vomiting that is not controlled by your nausea medication, call the clinic.   BELOW ARE SYMPTOMS THAT SHOULD BE REPORTED IMMEDIATELY:  *FEVER GREATER THAN 100.5 F  *CHILLS WITH OR WITHOUT FEVER  NAUSEA AND VOMITING THAT IS NOT CONTROLLED WITH YOUR NAUSEA MEDICATION  *UNUSUAL SHORTNESS OF BREATH  *UNUSUAL BRUISING OR BLEEDING  TENDERNESS IN MOUTH AND THROAT WITH OR WITHOUT PRESENCE OF ULCERS  *URINARY PROBLEMS  *BOWEL PROBLEMS  UNUSUAL RASH Items with * indicate a potential emergency and should be followed up as soon as possible.  Feel free to call the clinic you have any questions or concerns. The clinic phone number is [phone].

## 2014-05-02 NOTE — Progress Notes (Signed)
Ok to treat with low Platelet count today per Dr. Alvy Bimler.

## 2014-05-02 NOTE — Assessment & Plan Note (Signed)
I recommend she contact her primary care provider for possible discontinuation of metoprolol that could have contributed to hypotension and some of her symptoms of dizziness.

## 2014-05-02 NOTE — Assessment & Plan Note (Signed)
This is likely due to recent treatment. The patient denies recent history of fevers, cough, chills, diarrhea or dysuria. She is asymptomatic from the leukopenia. I will observe for now.  I will continue the chemotherapy at current dose without dosage adjustment apart from discontinuation of chlorambucil as above..  If the leukopenia gets progressive worse in the future, I might have to delay her treatment or adjust the chemotherapy dose.

## 2014-05-02 NOTE — Assessment & Plan Note (Signed)
Overall, she tolerated treatment well. We will proceed with treatment today without delay. I told her to discontinue chlorambucil due to worsening leukopenia and thrombocytopenia.

## 2014-05-30 ENCOUNTER — Encounter: Payer: Self-pay | Admitting: Hematology and Oncology

## 2014-05-30 ENCOUNTER — Ambulatory Visit (HOSPITAL_BASED_OUTPATIENT_CLINIC_OR_DEPARTMENT_OTHER): Payer: Medicare Other

## 2014-05-30 ENCOUNTER — Ambulatory Visit (HOSPITAL_BASED_OUTPATIENT_CLINIC_OR_DEPARTMENT_OTHER): Payer: Medicare Other | Admitting: Hematology and Oncology

## 2014-05-30 ENCOUNTER — Other Ambulatory Visit (HOSPITAL_BASED_OUTPATIENT_CLINIC_OR_DEPARTMENT_OTHER): Payer: Medicare Other

## 2014-05-30 ENCOUNTER — Telehealth: Payer: Self-pay | Admitting: Hematology and Oncology

## 2014-05-30 VITALS — BP 138/40 | HR 66 | Temp 97.9°F | Resp 20 | Ht 60.0 in | Wt 114.6 lb

## 2014-05-30 VITALS — BP 128/49 | HR 77 | Temp 97.8°F | Resp 20

## 2014-05-30 DIAGNOSIS — C9110 Chronic lymphocytic leukemia of B-cell type not having achieved remission: Secondary | ICD-10-CM

## 2014-05-30 DIAGNOSIS — Z5112 Encounter for antineoplastic immunotherapy: Secondary | ICD-10-CM

## 2014-05-30 DIAGNOSIS — D696 Thrombocytopenia, unspecified: Secondary | ICD-10-CM

## 2014-05-30 LAB — CBC & DIFF AND RETIC
BASO%: 1 % (ref 0.0–2.0)
Basophils Absolute: 0 10*3/uL (ref 0.0–0.1)
EOS%: 2.1 % (ref 0.0–7.0)
Eosinophils Absolute: 0.1 10*3/uL (ref 0.0–0.5)
HCT: 37.3 % (ref 34.8–46.6)
HEMOGLOBIN: 12.3 g/dL (ref 11.6–15.9)
Immature Retic Fract: 4.3 % (ref 1.60–10.00)
LYMPH#: 1 10*3/uL (ref 0.9–3.3)
LYMPH%: 26.7 % (ref 14.0–49.7)
MCH: 29 pg (ref 25.1–34.0)
MCHC: 33 g/dL (ref 31.5–36.0)
MCV: 88 fL (ref 79.5–101.0)
MONO#: 0.6 10*3/uL (ref 0.1–0.9)
MONO%: 15.9 % — ABNORMAL HIGH (ref 0.0–14.0)
NEUT#: 2.1 10*3/uL (ref 1.5–6.5)
NEUT%: 54.3 % (ref 38.4–76.8)
Platelets: 39 10*3/uL — ABNORMAL LOW (ref 145–400)
RBC: 4.24 10*6/uL (ref 3.70–5.45)
RDW: 13.4 % (ref 11.2–14.5)
RETIC CT ABS: 55.54 10*3/uL (ref 33.70–90.70)
Retic %: 1.31 % (ref 0.70–2.10)
WBC: 3.9 10*3/uL (ref 3.9–10.3)

## 2014-05-30 LAB — COMPREHENSIVE METABOLIC PANEL (CC13)
ALT: 17 U/L (ref 0–55)
ANION GAP: 8 meq/L (ref 3–11)
AST: 22 U/L (ref 5–34)
Albumin: 3.9 g/dL (ref 3.5–5.0)
Alkaline Phosphatase: 60 U/L (ref 40–150)
BILIRUBIN TOTAL: 0.57 mg/dL (ref 0.20–1.20)
BUN: 16.2 mg/dL (ref 7.0–26.0)
CALCIUM: 10 mg/dL (ref 8.4–10.4)
CO2: 26 meq/L (ref 22–29)
CREATININE: 0.9 mg/dL (ref 0.6–1.1)
Chloride: 106 mEq/L (ref 98–109)
Glucose: 78 mg/dl (ref 70–140)
Potassium: 4.6 mEq/L (ref 3.5–5.1)
Sodium: 140 mEq/L (ref 136–145)
Total Protein: 5.9 g/dL — ABNORMAL LOW (ref 6.4–8.3)

## 2014-05-30 MED ORDER — DIPHENHYDRAMINE HCL 50 MG/ML IJ SOLN
INTRAMUSCULAR | Status: AC
  Filled 2014-05-30: qty 1

## 2014-05-30 MED ORDER — DIPHENHYDRAMINE HCL 50 MG/ML IJ SOLN
50.0000 mg | Freq: Once | INTRAMUSCULAR | Status: AC
  Administered 2014-05-30: 50 mg via INTRAVENOUS

## 2014-05-30 MED ORDER — ACETAMINOPHEN 325 MG PO TABS
ORAL_TABLET | ORAL | Status: AC
  Filled 2014-05-30: qty 2

## 2014-05-30 MED ORDER — DEXAMETHASONE SODIUM PHOSPHATE 20 MG/5ML IJ SOLN
20.0000 mg | Freq: Once | INTRAMUSCULAR | Status: AC
  Administered 2014-05-30: 20 mg via INTRAVENOUS

## 2014-05-30 MED ORDER — DEXAMETHASONE SODIUM PHOSPHATE 20 MG/5ML IJ SOLN
INTRAMUSCULAR | Status: AC
  Filled 2014-05-30: qty 5

## 2014-05-30 MED ORDER — SODIUM CHLORIDE 0.9 % IV SOLN
Freq: Once | INTRAVENOUS | Status: AC
  Administered 2014-05-30: 10:00:00 via INTRAVENOUS

## 2014-05-30 MED ORDER — SODIUM CHLORIDE 0.9 % IJ SOLN
10.0000 mL | INTRAMUSCULAR | Status: DC | PRN
  Administered 2014-05-30: 10 mL
  Filled 2014-05-30: qty 10

## 2014-05-30 MED ORDER — SODIUM CHLORIDE 0.9 % IV SOLN
1000.0000 mg | Freq: Once | INTRAVENOUS | Status: AC
  Administered 2014-05-30: 1000 mg via INTRAVENOUS
  Filled 2014-05-30: qty 40

## 2014-05-30 MED ORDER — HEPARIN SOD (PORK) LOCK FLUSH 100 UNIT/ML IV SOLN
500.0000 [IU] | Freq: Once | INTRAVENOUS | Status: AC | PRN
  Administered 2014-05-30: 500 [IU]
  Filled 2014-05-30: qty 5

## 2014-05-30 MED ORDER — ACETAMINOPHEN 325 MG PO TABS
650.0000 mg | ORAL_TABLET | Freq: Once | ORAL | Status: AC
  Administered 2014-05-30: 650 mg via ORAL

## 2014-05-30 NOTE — Progress Notes (Signed)
[facility] OFFICE PROGRESS NOTE  Patient Care Team: Precious Reel, MD as PCP - General (Internal Medicine) Heath Lark, MD as Consulting Physician (Hematology and Oncology)  SUMMARY OF ONCOLOGIC HISTORY: Oncology History   CLL stage IV, deletion 13q     CLL (chronic lymphocytic leukemia)   12/12/2013 Bone Marrow Biopsy Bone marrow biopsy show significant involvement of CLL   12/13/2013 Procedure Patient had placement of a port   12/14/2013 Imaging CT scan of the chest, abdomen and pelvis showed significant lymphadenopathy and splenomegaly   12/20/2013 - 12/22/2013 Hospital Admission The patient was admitted to the hospital to begin cycle 1 day 1 of chemotherapy due to risk of infusion reaction. She tolerated treatment well without major side effects.   12/20/2013 - 03/13/2014 Chemotherapy started on cycle 1 of Obinutuzumab, chlorambucil and prednisone. All treatment placed on hold on 03/13/2014 due to persistent pancytopenia.   03/28/2014 Imaging Repeat imaging study showed excellent response to treatment.    INTERVAL HISTORY: Please see below for problem oriented charting. She is seen prior to cycle 6 of chemotherapy. She bruises easily. The patient denies any recent signs or symptoms of bleeding such as spontaneous epistaxis, hematuria or hematochezia.  REVIEW OF SYSTEMS:   Constitutional: Denies fevers, chills or abnormal weight loss Eyes: Denies blurriness of vision Ears, nose, mouth, throat, and face: Denies mucositis or sore throat Respiratory: Denies cough, dyspnea or wheezes Cardiovascular: Denies palpitation, chest discomfort or lower extremity swelling Gastrointestinal:  Denies nausea, heartburn or change in bowel habits Skin: Denies abnormal skin rashes Lymphatics: Denies new lymphadenopathy or easy bruising Neurological:Denies numbness, tingling or new weaknesses Behavioral/Psych: Mood is stable, no new changes  All other systems were reviewed with the patient  and are negative.  I have reviewed the past medical history, past surgical history, social history and family history with the patient and they are unchanged from previous note.  ALLERGIES:  is allergic to bactrim.  MEDICATIONS:  Current Outpatient Prescriptions  Medication Sig Dispense Refill  . acyclovir (ZOVIRAX) 400 MG tablet Take 1 tablet (400 mg total) by mouth daily.  90 tablet  3  . atorvastatin (LIPITOR) 20 MG tablet Take 20 mg by mouth every evening.       . calcium citrate-vitamin D (CITRACAL+D) 315-200 MG-UNIT per tablet Take 1 tablet by mouth daily.       . Cholecalciferol (VITAMIN D) 400 UNITS capsule Take 400 Units by mouth daily.      . diphenhydrAMINE (BENADRYL) 25 mg capsule Take 25 mg by mouth at bedtime as needed for allergies.      . Echinacea 400 MG CAPS Take 1 capsule by mouth 2 (two) times daily.        Marland Kitchen ESTRACE VAGINAL 0.1 MG/GM vaginal cream Place 1 Applicatorful vaginally 2 (two) times a week.       . folic acid (FOLVITE) 161 MCG tablet Take 800 mcg by mouth daily.      Marland Kitchen glucosamine-chondroitin 500-400 MG tablet Take 1 tablet by mouth daily.        Marland Kitchen lidocaine-prilocaine (EMLA) cream Apply 1 application topically as needed (port access.).      Marland Kitchen loratadine (CLARITIN) 10 MG tablet Take 10 mg by mouth daily.        . montelukast (SINGULAIR) 10 MG tablet Take 10 mg by mouth daily.       . Multiple Vitamin (MULTIVITAMIN) tablet Take 1 tablet by mouth daily.        . ondansetron (  ZOFRAN) 8 MG tablet Take 1 tablet (8 mg total) by mouth every 8 (eight) hours as needed for nausea.  30 tablet  3  . polyvinyl alcohol (ARTIFICIAL TEARS) 1.4 % ophthalmic solution Place 1 drop into both eyes 3 (three) times daily as needed for dry eyes.       . sodium chloride (OCEAN) 0.65 % SOLN nasal spray Place 1 spray into both nostrils 2 (two) times daily.       Marland Kitchen tiZANidine (ZANAFLEX) 4 MG tablet Take 4 mg by mouth 3 (three) times a week.      . zolpidem (AMBIEN) 5 MG tablet Take 5 mg  by mouth at bedtime as needed for sleep.        No current facility-administered medications for this visit.    PHYSICAL EXAMINATION: ECOG PERFORMANCE STATUS: 0 - Asymptomatic  Filed Vitals:   05/30/14 0909  BP: 138/40  Pulse: 66  Temp: 97.9 F (36.6 C)  Resp: 20   Filed Weights   05/30/14 0909  Weight: 114 lb 9.6 oz (51.982 kg)    GENERAL:alert, no distress and comfortable SKIN: skin color, texture, turgor are normal, no rashes or significant lesions. No petechiae rash EYES: normal, Conjunctiva are pink and non-injected, sclera clear OROPHARYNX:no exudate, no erythema and lips, buccal mucosa, and tongue normal  NECK: supple, thyroid normal size, non-tender, without nodularity LYMPH:  no palpable lymphadenopathy in the cervical, axillary or inguinal LUNGS: clear to auscultation and percussion with normal breathing effort HEART: regular rate & rhythm and no murmurs and no lower extremity edema ABDOMEN:abdomen soft, non-tender and normal bowel sounds Musculoskeletal:no cyanosis of digits and no clubbing  NEURO: alert & oriented x 3 with fluent speech, no focal motor/sensory deficits  LABORATORY DATA:  I have reviewed the data as listed    Component Value Date/Time   NA 140 05/30/2014 0829   NA 141 12/22/2013 0530   K 4.6 05/30/2014 0829   K 4.0 12/22/2013 0530   CL 107 12/22/2013 0530   CL 99 04/08/2013 0922   CO2 26 05/30/2014 0829   CO2 22 12/22/2013 0530   GLUCOSE 78 05/30/2014 0829   GLUCOSE 115* 12/22/2013 0530   GLUCOSE 92 04/08/2013 0922   BUN 16.2 05/30/2014 0829   BUN 26* 12/22/2013 0530   CREATININE 0.9 05/30/2014 0829   CREATININE 0.87 12/22/2013 0530   CALCIUM 10.0 05/30/2014 0829   CALCIUM 8.9 12/22/2013 0530   PROT 5.9* 05/30/2014 0829   PROT 5.1* 12/22/2013 0530   ALBUMIN 3.9 05/30/2014 0829   ALBUMIN 3.3* 12/22/2013 0530   AST 22 05/30/2014 0829   AST 49* 12/22/2013 0530   ALT 17 05/30/2014 0829   ALT 105* 12/22/2013 0530   ALKPHOS 60 05/30/2014 0829   ALKPHOS 49  12/22/2013 0530   BILITOT 0.57 05/30/2014 0829   BILITOT 0.7 12/22/2013 0530   GFRNONAA 62* 12/22/2013 0530   GFRAA 72* 12/22/2013 0530    No results found for this basename: SPEP, UPEP,  kappa and lambda light chains    Lab Results  Component Value Date   WBC 3.9 05/30/2014   NEUTROABS 2.1 05/30/2014   HGB 12.3 05/30/2014   HCT 37.3 05/30/2014   MCV 88.0 05/30/2014   PLT 39* 05/30/2014      Chemistry      Component Value Date/Time   NA 140 05/30/2014 0829   NA 141 12/22/2013 0530   K 4.6 05/30/2014 0829   K 4.0 12/22/2013 0530   CL 107 12/22/2013  0530   CL 99 04/08/2013 0922   CO2 26 05/30/2014 0829   CO2 22 12/22/2013 0530   BUN 16.2 05/30/2014 0829   BUN 26* 12/22/2013 0530   CREATININE 0.9 05/30/2014 0829   CREATININE 0.87 12/22/2013 0530      Component Value Date/Time   CALCIUM 10.0 05/30/2014 0829   CALCIUM 8.9 12/22/2013 0530   ALKPHOS 60 05/30/2014 0829   ALKPHOS 49 12/22/2013 0530   AST 22 05/30/2014 0829   AST 49* 12/22/2013 0530   ALT 17 05/30/2014 0829   ALT 105* 12/22/2013 0530   BILITOT 0.57 05/30/2014 0829   BILITOT 0.7 12/22/2013 0530      ASSESSMENT & PLAN:  CLL (chronic lymphocytic leukemia) Overall, she tolerated treatment well. We will proceed with treatment today without delay. I told her to discontinue chlorambucil due to worsening leukopenia and thrombocytopenia. She will complete her treatment today. I will see her back in 3 months with history, physical examination and blood work. I will repeat imaging study at the end of the year to document response rates.    Thrombocytopenia This is likely due to recent treatment and her underlying disease.. The patient denies recent history of bleeding such as epistaxis, hematuria or hematochezia. She is asymptomatic from the low platelet count. I will observe for now.  she does not require transfusion now. I will continue the chemotherapy at current dose without dosage adjustment except discontinuation of chlorambucil as  above.     No orders of the defined types were placed in this encounter.   All questions were answered. The patient knows to call the clinic with any problems, questions or concerns. No barriers to learning was detected. I spent 25 minutes counseling the patient face to face. The total time spent in the appointment was 30 minutes and more than 50% was on counseling and review of test results     [facility], [place], MD 05/30/2014 9:41 AM

## 2014-05-30 NOTE — Progress Notes (Signed)
OK to treat with current CBC results per Dr Alvy Bimler

## 2014-05-30 NOTE — Patient Instructions (Signed)
[facility] Discharge Instructions for Patients Receiving Chemotherapy  Today you received the following chemotherapy agents: Gazyva  To help prevent nausea and vomiting after your treatment, we encourage you to take your nausea medication as prescribed by your physician.    If you develop nausea and vomiting that is not controlled by your nausea medication, call the clinic.   BELOW ARE SYMPTOMS THAT SHOULD BE REPORTED IMMEDIATELY:  *FEVER GREATER THAN 100.5 F  *CHILLS WITH OR WITHOUT FEVER  NAUSEA AND VOMITING THAT IS NOT CONTROLLED WITH YOUR NAUSEA MEDICATION  *UNUSUAL SHORTNESS OF BREATH  *UNUSUAL BRUISING OR BLEEDING  TENDERNESS IN MOUTH AND THROAT WITH OR WITHOUT PRESENCE OF ULCERS  *URINARY PROBLEMS  *BOWEL PROBLEMS  UNUSUAL RASH Items with * indicate a potential emergency and should be followed up as soon as possible.  Feel free to call the clinic you have any questions or concerns. The clinic phone number is [phone].    

## 2014-05-30 NOTE — Assessment & Plan Note (Signed)
This is likely due to recent treatment and her underlying disease.. The patient denies recent history of bleeding such as epistaxis, hematuria or hematochezia. She is asymptomatic from the low platelet count. I will observe for now.  she does not require transfusion now. I will continue the chemotherapy at current dose without dosage adjustment except discontinuation of chlorambucil as above.

## 2014-05-30 NOTE — Assessment & Plan Note (Signed)
Overall, she tolerated treatment well. We will proceed with treatment today without delay. I told her to discontinue chlorambucil due to worsening leukopenia and thrombocytopenia. She will complete her treatment today. I will see her back in 3 months with history, physical examination and blood work. I will repeat imaging study at the end of the year to document response rates.

## 2014-06-15 ENCOUNTER — Encounter: Payer: Self-pay | Admitting: Internal Medicine

## 2014-06-22 ENCOUNTER — Telehealth: Payer: Self-pay | Admitting: Hematology and Oncology

## 2014-06-22 NOTE — Telephone Encounter (Signed)
returned pt call and sched flushes per pt request....pt ok adn aware

## 2014-07-18 ENCOUNTER — Ambulatory Visit (HOSPITAL_BASED_OUTPATIENT_CLINIC_OR_DEPARTMENT_OTHER): Payer: Medicare Other

## 2014-07-18 VITALS — BP 152/57 | HR 65 | Temp 97.5°F

## 2014-07-18 DIAGNOSIS — C9110 Chronic lymphocytic leukemia of B-cell type not having achieved remission: Secondary | ICD-10-CM

## 2014-07-18 DIAGNOSIS — Z452 Encounter for adjustment and management of vascular access device: Secondary | ICD-10-CM

## 2014-07-18 DIAGNOSIS — Z95828 Presence of other vascular implants and grafts: Secondary | ICD-10-CM

## 2014-07-18 MED ORDER — HEPARIN SOD (PORK) LOCK FLUSH 100 UNIT/ML IV SOLN
500.0000 [IU] | Freq: Once | INTRAVENOUS | Status: AC
  Administered 2014-07-18: 500 [IU] via INTRAVENOUS
  Filled 2014-07-18: qty 5

## 2014-07-18 MED ORDER — SODIUM CHLORIDE 0.9 % IJ SOLN
10.0000 mL | INTRAMUSCULAR | Status: DC | PRN
  Administered 2014-07-18: 10 mL via INTRAVENOUS
  Filled 2014-07-18: qty 10

## 2014-07-18 NOTE — Patient Instructions (Signed)

## 2014-07-26 ENCOUNTER — Telehealth: Payer: Self-pay | Admitting: *Deleted

## 2014-07-26 NOTE — Telephone Encounter (Signed)
Pt asks if ok to get flu shot?  Informed her ok per Dr. Alvy Bimler for her to get flu shot.  She also states she has occasional blood in her urine.  She sees some light blood on her tissue when she wipes.  She sees her GYN in November.  Instructed pt to call her PCP if she has anymore blood. She verbalized understanding.

## 2014-08-29 ENCOUNTER — Telehealth: Payer: Self-pay | Admitting: Hematology and Oncology

## 2014-08-29 ENCOUNTER — Telehealth: Payer: Self-pay | Admitting: *Deleted

## 2014-08-29 ENCOUNTER — Other Ambulatory Visit (HOSPITAL_BASED_OUTPATIENT_CLINIC_OR_DEPARTMENT_OTHER): Payer: Medicare Other

## 2014-08-29 ENCOUNTER — Other Ambulatory Visit: Payer: Self-pay

## 2014-08-29 ENCOUNTER — Ambulatory Visit (HOSPITAL_BASED_OUTPATIENT_CLINIC_OR_DEPARTMENT_OTHER): Payer: Medicare Other | Admitting: Hematology and Oncology

## 2014-08-29 ENCOUNTER — Ambulatory Visit: Payer: Medicare Other

## 2014-08-29 ENCOUNTER — Encounter: Payer: Self-pay | Admitting: Hematology and Oncology

## 2014-08-29 VITALS — BP 148/46 | HR 66 | Temp 98.0°F | Resp 18 | Ht 60.0 in | Wt 117.0 lb

## 2014-08-29 DIAGNOSIS — C9110 Chronic lymphocytic leukemia of B-cell type not having achieved remission: Secondary | ICD-10-CM

## 2014-08-29 DIAGNOSIS — D696 Thrombocytopenia, unspecified: Secondary | ICD-10-CM

## 2014-08-29 DIAGNOSIS — Z95828 Presence of other vascular implants and grafts: Secondary | ICD-10-CM

## 2014-08-29 DIAGNOSIS — Z1231 Encounter for screening mammogram for malignant neoplasm of breast: Secondary | ICD-10-CM

## 2014-08-29 LAB — CBC & DIFF AND RETIC
BASO%: 0.7 % (ref 0.0–2.0)
BASOS ABS: 0 10*3/uL (ref 0.0–0.1)
EOS ABS: 0.1 10*3/uL (ref 0.0–0.5)
EOS%: 2.4 % (ref 0.0–7.0)
HCT: 38.8 % (ref 34.8–46.6)
HEMOGLOBIN: 13 g/dL (ref 11.6–15.9)
Immature Retic Fract: 2.4 % (ref 1.60–10.00)
LYMPH#: 1 10*3/uL (ref 0.9–3.3)
LYMPH%: 23.2 % (ref 14.0–49.7)
MCH: 28.8 pg (ref 25.1–34.0)
MCHC: 33.5 g/dL (ref 31.5–36.0)
MCV: 85.8 fL (ref 79.5–101.0)
MONO#: 0.7 10*3/uL (ref 0.1–0.9)
MONO%: 16.6 % — AB (ref 0.0–14.0)
NEUT%: 57.1 % (ref 38.4–76.8)
NEUTROS ABS: 2.3 10*3/uL (ref 1.5–6.5)
Platelets: 83 10*3/uL — ABNORMAL LOW (ref 145–400)
RBC: 4.52 10*6/uL (ref 3.70–5.45)
RDW: 14 % (ref 11.2–14.5)
RETIC CT ABS: 38.42 10*3/uL (ref 33.70–90.70)
Retic %: 0.85 % (ref 0.70–2.10)
WBC: 4.1 10*3/uL (ref 3.9–10.3)

## 2014-08-29 LAB — COMPREHENSIVE METABOLIC PANEL (CC13)
ALT: 18 U/L (ref 0–55)
AST: 21 U/L (ref 5–34)
Albumin: 3.9 g/dL (ref 3.5–5.0)
Alkaline Phosphatase: 52 U/L (ref 40–150)
Anion Gap: 8 meq/L (ref 3–11)
BUN: 17.7 mg/dL (ref 7.0–26.0)
CO2: 25 meq/L (ref 22–29)
Calcium: 10.2 mg/dL (ref 8.4–10.4)
Chloride: 107 meq/L (ref 98–109)
Creatinine: 0.8 mg/dL (ref 0.6–1.1)
Glucose: 89 mg/dL (ref 70–140)
Potassium: 4.2 meq/L (ref 3.5–5.1)
Sodium: 141 meq/L (ref 136–145)
Total Bilirubin: 0.62 mg/dL (ref 0.20–1.20)
Total Protein: 5.7 g/dL — ABNORMAL LOW (ref 6.4–8.3)

## 2014-08-29 MED ORDER — SODIUM CHLORIDE 0.9 % IJ SOLN
10.0000 mL | INTRAMUSCULAR | Status: DC | PRN
  Administered 2014-08-29: 10 mL via INTRAVENOUS
  Filled 2014-08-29: qty 10

## 2014-08-29 MED ORDER — HEPARIN SOD (PORK) LOCK FLUSH 100 UNIT/ML IV SOLN
500.0000 [IU] | Freq: Once | INTRAVENOUS | Status: AC
  Administered 2014-08-29: 500 [IU] via INTRAVENOUS
  Filled 2014-08-29: qty 5

## 2014-08-29 NOTE — Telephone Encounter (Signed)
Pt confirmed labs/ov per 10/27 POF, sent msg to MD requesting pt is wanting port removed before next MD visit, gave pt AVS..... KJ

## 2014-08-29 NOTE — Telephone Encounter (Signed)
Can you place port removal order to be done tentatively on 12/28

## 2014-08-29 NOTE — Telephone Encounter (Signed)
Message copied by Cathlean Cower on Tue Aug 29, 2014  4:19 PM ------      Message from: [facility], [place]: Tue Aug 29, 2014 12:40 PM      Regarding: RE: Port removal       I said port removal after next CT scan confirmed remission, not before      ----- Message -----         From: Selena Lesser         Sent: 08/29/2014  11:09 AM           To: Heath Lark, MD      Subject: Port removal                                             Pt states she was advised port removal before she is to come back for her MD visit, I didn't see an order or referral can you please assist me with this?                  Thanks                   KIM        ------

## 2014-08-29 NOTE — Assessment & Plan Note (Signed)
Overall, she tolerated treatment well. She has completed 6 cycles of chemotherapy.  I will see her back in 2 months with history, physical examination and blood work and repeat CT scan. After that, if she has no evidence of disease, I will discontinue her port.

## 2014-08-29 NOTE — Progress Notes (Signed)
[facility] OFFICE PROGRESS NOTE  Patient Care Team: John M Russo, MD as PCP - General (Internal Medicine) Ni Gorsuch, MD as Consulting Physician (Hematology and Oncology)  SUMMARY OF ONCOLOGIC HISTORY: Oncology History   CLL stage IV, deletion 13q     CLL (chronic lymphocytic leukemia)   12/12/2013 Bone Marrow Biopsy Bone marrow biopsy show significant involvement of CLL   12/13/2013 Procedure Patient had placement of a port   12/14/2013 Imaging CT scan of the chest, abdomen and pelvis showed significant lymphadenopathy and splenomegaly   12/20/2013 - 12/22/2013 Hospital Admission The patient was admitted to the hospital to begin cycle 1 day 1 of chemotherapy due to risk of infusion reaction. She tolerated treatment well without major side effects.   12/20/2013 - 03/13/2014 Chemotherapy started on cycle 1 of Obinutuzumab, chlorambucil and prednisone. All treatment placed on hold on 03/13/2014 due to persistent pancytopenia.   03/28/2014 Imaging Repeat imaging study showed excellent response to treatment.    INTERVAL HISTORY: Please see below for problem oriented charting. She had recent skin cancer removed from her face. She feels well apart from some sinus allergies. The patient denies any recent signs or symptoms of bleeding such as spontaneous epistaxis, hematuria or hematochezia.  REVIEW OF SYSTEMS:   Constitutional: Denies fevers, chills or abnormal weight loss Eyes: Denies blurriness of vision Ears, nose, mouth, throat, and face: Denies mucositis or sore throat Respiratory: Denies cough, dyspnea or wheezes Cardiovascular: Denies palpitation, chest discomfort or lower extremity swelling Gastrointestinal:  Denies nausea, heartburn or change in bowel habits Skin: Denies abnormal skin rashes Lymphatics: Denies new lymphadenopathy or easy bruising Neurological:Denies numbness, tingling or new weaknesses Behavioral/Psych: Mood is stable, no new changes  All other systems  were reviewed with the patient and are negative.  I have reviewed the past medical history, past surgical history, social history and family history with the patient and they are unchanged from previous note.  ALLERGIES:  is allergic to bactrim.  MEDICATIONS:  Current Outpatient Prescriptions  Medication Sig Dispense Refill  . acyclovir (ZOVIRAX) 400 MG tablet Take 1 tablet (400 mg total) by mouth daily.  90 tablet  3  . atorvastatin (LIPITOR) 20 MG tablet Take 20 mg by mouth every evening.       . calcium citrate-vitamin D (CITRACAL+D) 315-200 MG-UNIT per tablet Take 1 tablet by mouth daily.       . Cholecalciferol (VITAMIN D) 400 UNITS capsule Take 400 Units by mouth daily.      . diphenhydrAMINE (BENADRYL) 25 mg capsule Take 25 mg by mouth at bedtime as needed for allergies.      . Echinacea 400 MG CAPS Take 1 capsule by mouth 2 (two) times daily.        . ESTRACE VAGINAL 0.1 MG/GM vaginal cream Place 1 Applicatorful vaginally 2 (two) times a week.       . folic acid (FOLVITE) 800 MCG tablet Take 800 mcg by mouth daily.      . glucosamine-chondroitin 500-400 MG tablet Take 1 tablet by mouth daily.        . lidocaine-prilocaine (EMLA) cream Apply 1 application topically as needed (port access.).      . loratadine (CLARITIN) 10 MG tablet Take 10 mg by mouth daily.        . montelukast (SINGULAIR) 10 MG tablet Take 10 mg by mouth daily.       . Multiple Vitamin (MULTIVITAMIN) tablet Take 1 tablet by mouth daily.        .   ondansetron (ZOFRAN) 8 MG tablet Take 1 tablet (8 mg total) by mouth every 8 (eight) hours as needed for nausea.  30 tablet  3  . polyvinyl alcohol (ARTIFICIAL TEARS) 1.4 % ophthalmic solution Place 1 drop into both eyes 3 (three) times daily as needed for dry eyes.       . sodium chloride (OCEAN) 0.65 % SOLN nasal spray Place 1 spray into both nostrils 2 (two) times daily.       . tiZANidine (ZANAFLEX) 4 MG tablet Take 4 mg by mouth 3 (three) times a week.      . zolpidem  (AMBIEN) 5 MG tablet Take 5 mg by mouth at bedtime as needed for sleep.        No current facility-administered medications for this visit.    PHYSICAL EXAMINATION: ECOG PERFORMANCE STATUS: 0 - Asymptomatic  Filed Vitals:   08/29/14 0954  BP: 148/46  Pulse: 66  Temp: 98 F (36.7 C)  Resp: 18   Filed Weights   08/29/14 0954  Weight: 117 lb (53.071 kg)    GENERAL:alert, no distress and comfortable SKIN: skin color, texture, turgor are normal, no rashes or significant lesions. Well-healed surgical scar EYES: normal, Conjunctiva are pink and non-injected, sclera clear OROPHARYNX:no exudate, no erythema and lips, buccal mucosa, and tongue normal  NECK: supple, thyroid normal size, non-tender, without nodularity LYMPH:  no palpable lymphadenopathy in the cervical, axillary or inguinal LUNGS: clear to auscultation and percussion with normal breathing effort HEART: regular rate & rhythm and no murmurs and no lower extremity edema ABDOMEN:abdomen soft, non-tender and normal bowel sounds Musculoskeletal:no cyanosis of digits and no clubbing  NEURO: alert & oriented x 3 with fluent speech, no focal motor/sensory deficits  LABORATORY DATA:  I have reviewed the data as listed    Component Value Date/Time   NA 141 08/29/2014 0939   NA 141 12/22/2013 0530   K 4.2 08/29/2014 0939   K 4.0 12/22/2013 0530   CL 107 12/22/2013 0530   CL 99 04/08/2013 0922   CO2 25 08/29/2014 0939   CO2 22 12/22/2013 0530   GLUCOSE 89 08/29/2014 0939   GLUCOSE 115* 12/22/2013 0530   GLUCOSE 92 04/08/2013 0922   BUN 17.7 08/29/2014 0939   BUN 26* 12/22/2013 0530   CREATININE 0.8 08/29/2014 0939   CREATININE 0.87 12/22/2013 0530   CALCIUM 10.2 08/29/2014 0939   CALCIUM 8.9 12/22/2013 0530   PROT 5.7* 08/29/2014 0939   PROT 5.1* 12/22/2013 0530   ALBUMIN 3.9 08/29/2014 0939   ALBUMIN 3.3* 12/22/2013 0530   AST 21 08/29/2014 0939   AST 49* 12/22/2013 0530   ALT 18 08/29/2014 0939   ALT 105* 12/22/2013 0530    ALKPHOS 52 08/29/2014 0939   ALKPHOS 49 12/22/2013 0530   BILITOT 0.62 08/29/2014 0939   BILITOT 0.7 12/22/2013 0530   GFRNONAA 62* 12/22/2013 0530   GFRAA 72* 12/22/2013 0530    No results found for this basename: SPEP, UPEP,  kappa and lambda light chains    Lab Results  Component Value Date   WBC 4.1 08/29/2014   NEUTROABS 2.3 08/29/2014   HGB 13.0 08/29/2014   HCT 38.8 08/29/2014   MCV 85.8 08/29/2014   PLT 83* 08/29/2014      Chemistry      Component Value Date/Time   NA 141 08/29/2014 0939   NA 141 12/22/2013 0530   K 4.2 08/29/2014 0939   K 4.0 12/22/2013 0530   CL 107 12/22/2013 0530     CL 99 04/08/2013 0922   CO2 25 08/29/2014 0939   CO2 22 12/22/2013 0530   BUN 17.7 08/29/2014 0939   BUN 26* 12/22/2013 0530   CREATININE 0.8 08/29/2014 0939   CREATININE 0.87 12/22/2013 0530      Component Value Date/Time   CALCIUM 10.2 08/29/2014 0939   CALCIUM 8.9 12/22/2013 0530   ALKPHOS 52 08/29/2014 0939   ALKPHOS 49 12/22/2013 0530   AST 21 08/29/2014 0939   AST 49* 12/22/2013 0530   ALT 18 08/29/2014 0939   ALT 105* 12/22/2013 0530   BILITOT 0.62 08/29/2014 0939   BILITOT 0.7 12/22/2013 0530      ASSESSMENT & PLAN:  CLL (chronic lymphocytic leukemia) Overall, she tolerated treatment well. She has completed 6 cycles of chemotherapy.  I will see her back in 2 months with history, physical examination and blood work and repeat CT scan. After that, if she has no evidence of disease, I will discontinue her port.      Thrombocytopenia This is likely due to recent treatment and her underlying disease.. The patient denies recent history of bleeding such as epistaxis, hematuria or hematochezia. She is asymptomatic from the low platelet count. I will observe for now.  she does not require transfusion now.     Orders Placed This Encounter  Procedures  . CT Abdomen Pelvis W Contrast    Standing Status: Future     Number of Occurrences:      Standing Expiration Date: 11/29/2015     Order Specific Question:  Reason for Exam (SYMPTOM  OR DIAGNOSIS REQUIRED)    Answer:  staging CLL    Order Specific Question:  Preferred imaging location?    Answer:  [facility]  . CT Chest W Contrast    Standing Status: Future     Number of Occurrences:      Standing Expiration Date: 10/29/2015    Order Specific Question:  Reason for Exam (SYMPTOM  OR DIAGNOSIS REQUIRED)    Answer:  staging CLL    Order Specific Question:  Preferred imaging location?    Answer:  [facility]  . CBC with Differential    Standing Status: Future     Number of Occurrences:      Standing Expiration Date: 10/03/2015  . Comprehensive metabolic panel    Standing Status: Future     Number of Occurrences:      Standing Expiration Date: 10/03/2015  . Lactate dehydrogenase    Standing Status: Future     Number of Occurrences:      Standing Expiration Date: 10/03/2015   All questions were answered. The patient knows to call the clinic with any problems, questions or concerns. No barriers to learning was detected. I spent 25 minutes counseling the patient face to face. The total time spent in the appointment was 30 minutes and more than 50% was on counseling and review of test results     [facility], [place], MD 08/29/2014 4:49 PM

## 2014-08-29 NOTE — Telephone Encounter (Signed)
Pt asks if she can get her scan done a little sooner so she can get her port removed by the end of this year?  She has met all her insurance deductibles this year and would like to have it done before the end of the year if possible.  Her CT scan is 12/23 and f/u w/ Dr. Alvy Bimler is 12/29.   Pt will be out of town 12/17 thru 12/21 and can get CT done before if ok w/ Dr. Alvy Bimler.

## 2014-08-29 NOTE — Assessment & Plan Note (Signed)
This is likely due to recent treatment and her underlying disease.. The patient denies recent history of bleeding such as epistaxis, hematuria or hematochezia. She is asymptomatic from the low platelet count. I will observe for now.  she does not require transfusion now.

## 2014-08-29 NOTE — Patient Instructions (Signed)

## 2014-08-30 NOTE — Telephone Encounter (Signed)
Informed pt of PAC removal ordered for 12/28 an we can cancel it if needed after CT scan.  She verbalized understanding.

## 2014-08-31 ENCOUNTER — Other Ambulatory Visit: Payer: Self-pay | Admitting: Hematology and Oncology

## 2014-08-31 DIAGNOSIS — C9110 Chronic lymphocytic leukemia of B-cell type not having achieved remission: Secondary | ICD-10-CM

## 2014-09-04 ENCOUNTER — Ambulatory Visit
Admission: RE | Admit: 2014-09-04 | Discharge: 2014-09-04 | Disposition: A | Discharge status: Home / Self Care | Payer: Medicare Other | Source: Ambulatory Visit

## 2014-09-04 DIAGNOSIS — Z1231 Encounter for screening mammogram for malignant neoplasm of breast: Secondary | ICD-10-CM

## 2014-09-07 ENCOUNTER — Other Ambulatory Visit: Payer: Self-pay | Admitting: Hematology and Oncology

## 2014-10-25 ENCOUNTER — Ambulatory Visit (HOSPITAL_COMMUNITY)
Admission: RE | Admit: 2014-10-25 | Discharge: 2014-10-25 | Disposition: A | Discharge status: Home / Self Care | Payer: Medicare Other | Source: Ambulatory Visit | Attending: Hematology and Oncology | Admitting: Hematology and Oncology

## 2014-10-25 ENCOUNTER — Ambulatory Visit (HOSPITAL_BASED_OUTPATIENT_CLINIC_OR_DEPARTMENT_OTHER): Payer: Medicare Other | Admitting: Lab

## 2014-10-25 ENCOUNTER — Ambulatory Visit (HOSPITAL_BASED_OUTPATIENT_CLINIC_OR_DEPARTMENT_OTHER): Payer: Medicare Other

## 2014-10-25 DIAGNOSIS — C9110 Chronic lymphocytic leukemia of B-cell type not having achieved remission: Secondary | ICD-10-CM | POA: Insufficient documentation

## 2014-10-25 DIAGNOSIS — Z452 Encounter for adjustment and management of vascular access device: Secondary | ICD-10-CM

## 2014-10-25 DIAGNOSIS — Z95828 Presence of other vascular implants and grafts: Secondary | ICD-10-CM

## 2014-10-25 DIAGNOSIS — Z9221 Personal history of antineoplastic chemotherapy: Secondary | ICD-10-CM | POA: Insufficient documentation

## 2014-10-25 LAB — CBC WITH DIFFERENTIAL/PLATELET
BASO%: 2.2 % — ABNORMAL HIGH (ref 0.0–2.0)
BASOS ABS: 0.1 10*3/uL (ref 0.0–0.1)
EOS ABS: 0.1 10*3/uL (ref 0.0–0.5)
EOS%: 4.1 % (ref 0.0–7.0)
HCT: 40.6 % (ref 34.8–46.6)
HEMOGLOBIN: 13.4 g/dL (ref 11.6–15.9)
LYMPH%: 35.2 % (ref 14.0–49.7)
MCH: 28.6 pg (ref 25.1–34.0)
MCHC: 33 g/dL (ref 31.5–36.0)
MCV: 86.6 fL (ref 79.5–101.0)
MONO#: 0.7 10*3/uL (ref 0.1–0.9)
MONO%: 20.8 % — ABNORMAL HIGH (ref 0.0–14.0)
NEUT%: 37.7 % — ABNORMAL LOW (ref 38.4–76.8)
NEUTROS ABS: 1.2 10*3/uL — AB (ref 1.5–6.5)
PLATELETS: 97 10*3/uL — AB (ref 145–400)
RBC: 4.69 10*6/uL (ref 3.70–5.45)
RDW: 14.4 % (ref 11.2–14.5)
WBC: 3.2 10*3/uL — ABNORMAL LOW (ref 3.9–10.3)
lymph#: 1.1 10*3/uL (ref 0.9–3.3)

## 2014-10-25 LAB — COMPREHENSIVE METABOLIC PANEL (CC13)
ALBUMIN: 4.2 g/dL (ref 3.5–5.0)
ALK PHOS: 49 U/L (ref 40–150)
ALT: 21 U/L (ref 0–55)
AST: 23 U/L (ref 5–34)
Anion Gap: 9 mEq/L (ref 3–11)
BILIRUBIN TOTAL: 0.75 mg/dL (ref 0.20–1.20)
BUN: 17.6 mg/dL (ref 7.0–26.0)
CO2: 25 mEq/L (ref 22–29)
Calcium: 9.9 mg/dL (ref 8.4–10.4)
Chloride: 108 mEq/L (ref 98–109)
Creatinine: 1 mg/dL (ref 0.6–1.1)
EGFR: 57 mL/min/{1.73_m2} — AB (ref >90)
Glucose: 98 mg/dl (ref 70–140)
POTASSIUM: 4.2 meq/L (ref 3.5–5.1)
Sodium: 142 mEq/L (ref 136–145)
Total Protein: 5.9 g/dL — ABNORMAL LOW (ref 6.4–8.3)

## 2014-10-25 LAB — LACTATE DEHYDROGENASE (CC13): LDH: 168 U/L (ref 125–245)

## 2014-10-25 MED ORDER — IOHEXOL 300 MG/ML  SOLN
100.0000 mL | Freq: Once | INTRAMUSCULAR | Status: AC | PRN
  Administered 2014-10-25: 100 mL via INTRAVENOUS

## 2014-10-25 MED ORDER — HEPARIN SOD (PORK) LOCK FLUSH 100 UNIT/ML IV SOLN
500.0000 [IU] | Freq: Once | INTRAVENOUS | Status: AC
  Administered 2014-10-25: 500 [IU] via INTRAVENOUS
  Filled 2014-10-25: qty 5

## 2014-10-25 MED ORDER — SODIUM CHLORIDE 0.9 % IJ SOLN
10.0000 mL | INTRAMUSCULAR | Status: DC | PRN
  Administered 2014-10-25: 10 mL via INTRAVENOUS
  Filled 2014-10-25: qty 10

## 2014-10-25 NOTE — Patient Instructions (Signed)

## 2014-10-30 ENCOUNTER — Other Ambulatory Visit: Payer: Medicare Other

## 2014-10-31 ENCOUNTER — Telehealth: Payer: Self-pay | Admitting: Hematology and Oncology

## 2014-10-31 ENCOUNTER — Encounter: Payer: Self-pay | Admitting: Hematology and Oncology

## 2014-10-31 ENCOUNTER — Other Ambulatory Visit: Payer: Self-pay | Admitting: Radiology

## 2014-10-31 ENCOUNTER — Ambulatory Visit (HOSPITAL_BASED_OUTPATIENT_CLINIC_OR_DEPARTMENT_OTHER): Payer: Medicare Other | Admitting: Hematology and Oncology

## 2014-10-31 VITALS — BP 123/49 | HR 64 | Temp 98.1°F | Resp 18 | Ht 60.0 in | Wt 117.4 lb

## 2014-10-31 DIAGNOSIS — C9110 Chronic lymphocytic leukemia of B-cell type not having achieved remission: Secondary | ICD-10-CM

## 2014-10-31 DIAGNOSIS — R918 Other nonspecific abnormal finding of lung field: Secondary | ICD-10-CM

## 2014-10-31 DIAGNOSIS — D72819 Decreased white blood cell count, unspecified: Secondary | ICD-10-CM

## 2014-10-31 DIAGNOSIS — D696 Thrombocytopenia, unspecified: Secondary | ICD-10-CM

## 2014-10-31 NOTE — Telephone Encounter (Signed)
gv and printed appt sched and avs for pt for June 2016...gv pt barium °

## 2014-11-01 ENCOUNTER — Ambulatory Visit (HOSPITAL_COMMUNITY)
Admission: RE | Admit: 2014-11-01 | Discharge: 2014-11-01 | Disposition: A | Discharge status: Home / Self Care | Payer: Medicare Other | Source: Ambulatory Visit | Attending: Hematology and Oncology | Admitting: Hematology and Oncology

## 2014-11-01 ENCOUNTER — Encounter (HOSPITAL_COMMUNITY): Payer: Self-pay

## 2014-11-01 DIAGNOSIS — E785 Hyperlipidemia, unspecified: Secondary | ICD-10-CM | POA: Insufficient documentation

## 2014-11-01 DIAGNOSIS — Z79899 Other long term (current) drug therapy: Secondary | ICD-10-CM | POA: Insufficient documentation

## 2014-11-01 DIAGNOSIS — I10 Essential (primary) hypertension: Secondary | ICD-10-CM | POA: Diagnosis not present

## 2014-11-01 DIAGNOSIS — K219 Gastro-esophageal reflux disease without esophagitis: Secondary | ICD-10-CM | POA: Diagnosis not present

## 2014-11-01 DIAGNOSIS — C9110 Chronic lymphocytic leukemia of B-cell type not having achieved remission: Secondary | ICD-10-CM | POA: Insufficient documentation

## 2014-11-01 DIAGNOSIS — M810 Age-related osteoporosis without current pathological fracture: Secondary | ICD-10-CM | POA: Insufficient documentation

## 2014-11-01 DIAGNOSIS — Z452 Encounter for adjustment and management of vascular access device: Secondary | ICD-10-CM | POA: Insufficient documentation

## 2014-11-01 DIAGNOSIS — D696 Thrombocytopenia, unspecified: Secondary | ICD-10-CM | POA: Insufficient documentation

## 2014-11-01 DIAGNOSIS — R918 Other nonspecific abnormal finding of lung field: Secondary | ICD-10-CM | POA: Insufficient documentation

## 2014-11-01 LAB — CBC WITH DIFFERENTIAL/PLATELET
BASOS ABS: 0.1 10*3/uL (ref 0.0–0.1)
Basophils Relative: 3 % — ABNORMAL HIGH (ref 0–1)
Eosinophils Absolute: 0.1 10*3/uL (ref 0.0–0.7)
Eosinophils Relative: 3 % (ref 0–5)
HCT: 41 % (ref 36.0–46.0)
Hemoglobin: 13.6 g/dL (ref 12.0–15.0)
LYMPHS ABS: 1.5 10*3/uL (ref 0.7–4.0)
LYMPHS PCT: 50 % — AB (ref 12–46)
MCH: 29.2 pg (ref 26.0–34.0)
MCHC: 33.2 g/dL (ref 30.0–36.0)
MCV: 88.2 fL (ref 78.0–100.0)
Monocytes Absolute: 0.7 10*3/uL (ref 0.1–1.0)
Monocytes Relative: 23 % — ABNORMAL HIGH (ref 3–12)
NEUTROS ABS: 0.6 10*3/uL — AB (ref 1.7–7.7)
NEUTROS PCT: 21 % — AB (ref 43–77)
PLATELETS: 88 10*3/uL — AB (ref 150–400)
RBC: 4.65 MIL/uL (ref 3.87–5.11)
RDW: 14.2 % (ref 11.5–15.5)
WBC: 3 10*3/uL — AB (ref 4.0–10.5)

## 2014-11-01 LAB — PROTIME-INR
INR: 1.03 (ref 0.00–1.49)
Prothrombin Time: 13.6 seconds (ref 11.6–15.2)

## 2014-11-01 MED ORDER — CEFAZOLIN SODIUM-DEXTROSE 2-3 GM-% IV SOLR
2.0000 g | Freq: Once | INTRAVENOUS | Status: AC
  Administered 2014-11-01: 2 g via INTRAVENOUS

## 2014-11-01 MED ORDER — FENTANYL CITRATE 0.05 MG/ML IJ SOLN
INTRAMUSCULAR | Status: AC
  Filled 2014-11-01: qty 2

## 2014-11-01 MED ORDER — MIDAZOLAM HCL 2 MG/2ML IJ SOLN
INTRAMUSCULAR | Status: DC
Start: 2014-11-01 — End: 2014-11-02
  Filled 2014-11-01: qty 2

## 2014-11-01 MED ORDER — CEFAZOLIN SODIUM-DEXTROSE 2-3 GM-% IV SOLR
INTRAVENOUS | Status: DC
Start: 2014-11-01 — End: 2014-11-02
  Filled 2014-11-01: qty 50

## 2014-11-01 MED ORDER — MIDAZOLAM HCL 2 MG/2ML IJ SOLN
INTRAMUSCULAR | Status: AC | PRN
  Administered 2014-11-01: 2 mg via INTRAVENOUS

## 2014-11-01 MED ORDER — SODIUM CHLORIDE 0.9 % IV SOLN
INTRAVENOUS | Status: DC
  Administered 2014-11-01: 12:00:00 via INTRAVENOUS

## 2014-11-01 MED ORDER — FENTANYL CITRATE 0.05 MG/ML IJ SOLN
INTRAMUSCULAR | Status: AC | PRN
  Administered 2014-11-01 (×2): 50 ug via INTRAVENOUS

## 2014-11-01 NOTE — Assessment & Plan Note (Signed)
This is chronic in nature and unrelated to her CLL. Continue close observation.

## 2014-11-01 NOTE — H&P (Signed)
Chief Complaint: "I'm here to have my port taken out"  Referring Physician(s): Gorsuch,Ni  History of Present Illness: Angelica Sullivan is a 80 y.o. female with history of CLL who has completed chemotherapy. She presents today for port a cath removal.   Past Medical History  Diagnosis Date  . Hypertension   . Hyperlipidemia   . Irritable bowel   . Osteoporosis   . GERD (gastroesophageal reflux disease)   . Rhinitis, chronic 09/15/2011  . Thrombocytopenia 09/15/2011  . Leukemia 08/06/09    CLL  . 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 Leukopenia 01/16/2014    Past Surgical History  Procedure Laterality Date  . Tonsillectomy and adenoidectomy    . Hysterectomy vaginal    . Anterior and posterior vaginal repair    . Hemorrhoid surgery    . Laparoscopic cholecystectomy    . Anterior and posterior vaginal repair      repeat  . Broncoscopy      Allergies: Bactrim  Medications: Prior to Admission medications   Medication Sig Start Date End Date Taking? Authorizing Provider  acyclovir (ZOVIRAX) 400 MG tablet Take 1 tablet (400 mg total) by mouth daily. 03/30/14   Heath Lark, MD  atorvastatin (LIPITOR) 20 MG tablet Take 20 mg by mouth every evening.     Historical Provider, MD  calcium citrate-vitamin D (CITRACAL+D) 315-200 MG-UNIT per tablet Take 1 tablet by mouth daily.     Historical Provider, MD  Cholecalciferol (VITAMIN D) 400 UNITS capsule Take 400 Units by mouth at bedtime.     Historical Provider, MD  diphenhydrAMINE (BENADRYL) 25 mg capsule Take 25 mg by mouth at bedtime as needed for allergies (allergies).     Historical Provider, MD  Echinacea 400 MG CAPS Take 1 capsule by mouth 2 (two) times daily.      Historical Provider, MD  ESTRACE VAGINAL 0.1 MG/GM vaginal cream Place 1 Applicatorful vaginally 2 (two) times a week.  10/24/13   Historical Provider, MD  folic acid (FOLVITE) 734 MCG tablet Take 800 mcg by mouth daily.    Historical Provider, MD    glucosamine-chondroitin 500-400 MG tablet Take 1 tablet by mouth daily.      Historical Provider, MD  lidocaine-prilocaine (EMLA) cream Apply 1 application topically as needed (port access.). 12/16/13   Heath Lark, MD  loratadine (CLARITIN) 10 MG tablet Take 10 mg by mouth daily.      Historical Provider, MD  montelukast (SINGULAIR) 10 MG tablet Take 10 mg by mouth daily.     Historical Provider, MD  Multiple Vitamin (MULTIVITAMIN) tablet Take 1 tablet by mouth daily.      Historical Provider, MD  ondansetron (ZOFRAN) 8 MG tablet Take 1 tablet (8 mg total) by mouth every 8 (eight) hours as needed for nausea. 12/16/13   Heath Lark, MD  polyvinyl alcohol (ARTIFICIAL TEARS) 1.4 % ophthalmic solution Place 1 drop into both eyes 3 (three) times daily as needed for dry eyes (dry eyes).     Historical Provider, MD  sodium chloride (OCEAN) 0.65 % SOLN nasal spray Place 1 spray into both nostrils 2 (two) times daily.     Historical Provider, MD  tiZANidine (ZANAFLEX) 4 MG tablet Take 4 mg by mouth 3 (three) times a week.    Historical Provider, MD  zolpidem (AMBIEN) 5 MG tablet Take 5 mg by mouth at bedtime as needed for sleep (sleep).     Historical Provider, MD    Family History  Problem Relation  Age of Onset  . Pneumonia Father     History   Social History  . Marital Status: Married    Spouse Name: N/A    Number of Children: N/A  . Years of Education: N/A   Social History Main Topics  . Smoking status: Never Smoker   . Smokeless tobacco: Never Used  . Alcohol Use: No  . Drug Use: No  . Sexual Activity: No   Other Topics Concern  . None   Social History Narrative        Review of Systems  Constitutional: Positive for fatigue. Negative for fever and chills.  Respiratory: Negative for shortness of breath.        Occ cough  Cardiovascular: Negative for chest pain.  Gastrointestinal: Negative for nausea, vomiting, abdominal pain and blood in stool.  Genitourinary: Negative for  dysuria and hematuria.  Musculoskeletal: Negative for back pain.  Neurological: Negative for headaches.    Vital Signs: BP 148/51  HR 72  R 18  TEMP 97.5  O2 SATS 100% RA Physical Exam  Constitutional: She is oriented to person, place, and time.  Thin WF in NAD  Cardiovascular: Normal rate and regular rhythm.   Pulmonary/Chest: Effort normal and breath sounds normal.  Clean, intact rt chest wall PAC  Abdominal: Soft. Bowel sounds are normal. There is no tenderness.  Musculoskeletal: Normal range of motion. She exhibits no edema.  Neurological: She is alert and oriented to person, place, and time.    Imaging: Ct Chest W Contrast  10/25/2014   CLINICAL DATA:  Follow-up CLL, diagnosed 2009, last chemotherapy 05/2014  EXAM: CT CHEST, ABDOMEN, AND PELVIS WITH CONTRAST  TECHNIQUE: Multidetector CT imaging of the chest, abdomen and pelvis was performed following the standard protocol during bolus administration of intravenous contrast.  CONTRAST:  176mL OMNIPAQUE IOHEXOL 300 MG/ML  SOLN  COMPARISON:  03/28/2014  FINDINGS: CT CHEST FINDINGS  Stable faint ground-glass nodular opacity in the lateral right upper lobe (series 4/ image 11), measuring 2.4 cm. Additional 6 mm ground-glass nodule in the left upper lobe (series 4/ image 19).  Mild subpleural nodularity/scarring in the lateral right lower lobe (series 4/image 39). No pleural effusion or pneumothorax.  Visualized thyroid is unremarkable.  The heart is normal in size. No pericardial effusion. Mild coronary atherosclerosis in the circumflex. Atherosclerotic calcifications of the aortic arch.  Right chest port terminating at the cavoatrial junction.  Calcified retroesophageal node measuring 13 mm short axis (series 2/ image 17).  Mild thoracic lymphadenopathy, including:  --9 mm short axis left supraclavicular node (series 2/image 1), previously 7 mm  --10 mm short axis right axillary node (series 2/image 9), previously 9 mm  --8 mm short axis left  axillary node (series 2/image 12), previously 7 mm  Degenerative changes of the visualized thoracolumbar spine.  CT ABDOMEN AND PELVIS FINDINGS  Hepatobiliary: Liver is within normal limits.  Status post cholecystectomy.  Mild prominence of the common duct, which smoothly tapers at the ampulla.  Pancreas: Pancreas is within normal limits, noting overlying vascular calcifications.  Spleen: Spleen is normal in size.  Adrenals/Urinary Tract: Adrenal glands are unremarkable.  Bilateral renal atrophy.  Right extrarenal pelvis.  No hydronephrosis.  Bladder is within normal limits.  Stomach/Bowel: Stomach is unremarkable.  No evidence of bowel obstruction.  Vascular/Lymphatic: Atherosclerotic calcifications of the abdominal aorta and branch vessels.  Abdominopelvic lymphadenopathy, including:  --1.2 cm short axis gastrohepatic node (series 2/image 50), previously 1.4 cm  --2.1 cm short axis portacaval  node (series 2/ image 53), previously 2.2 cm  --1.4 cm short axis left para-aortic node (series 2/image 61), unchanged  --1.8 cm short axis portacaval node (series 2/image 73), previously 1.4 cm  --1.2 cm short axis right pelvic sidewall node (series 2/image 23), previously 1.1 cm  Reproductive: Status post hysterectomy.  Bilateral ovaries are within normal limits.  Other: No abdominopelvic ascites.  Musculoskeletal: Degenerative changes of the lumbar spine.  IMPRESSION: Mild thoracic lymphadenopathy, stable versus minimally increased.  Abdominopelvic lymphadenopathy, including a mildly increased portacaval node, but most of which is grossly unchanged.  Stable ground-glass pulmonary nodules.  Spleen is normal in size.   Electronically Signed   By: Julian Hy M.D.   On: 10/25/2014 13:58   Ct Abdomen Pelvis W Contrast  10/25/2014   CLINICAL DATA:  Follow-up CLL, diagnosed 2009, last chemotherapy 05/2014  EXAM: CT CHEST, ABDOMEN, AND PELVIS WITH CONTRAST  TECHNIQUE: Multidetector CT imaging of the chest, abdomen and  pelvis was performed following the standard protocol during bolus administration of intravenous contrast.  CONTRAST:  169mL OMNIPAQUE IOHEXOL 300 MG/ML  SOLN  COMPARISON:  03/28/2014  FINDINGS: CT CHEST FINDINGS  Stable faint ground-glass nodular opacity in the lateral right upper lobe (series 4/ image 11), measuring 2.4 cm. Additional 6 mm ground-glass nodule in the left upper lobe (series 4/ image 19).  Mild subpleural nodularity/scarring in the lateral right lower lobe (series 4/image 39). No pleural effusion or pneumothorax.  Visualized thyroid is unremarkable.  The heart is normal in size. No pericardial effusion. Mild coronary atherosclerosis in the circumflex. Atherosclerotic calcifications of the aortic arch.  Right chest port terminating at the cavoatrial junction.  Calcified retroesophageal node measuring 13 mm short axis (series 2/ image 17).  Mild thoracic lymphadenopathy, including:  --9 mm short axis left supraclavicular node (series 2/image 1), previously 7 mm  --10 mm short axis right axillary node (series 2/image 9), previously 9 mm  --8 mm short axis left axillary node (series 2/image 12), previously 7 mm  Degenerative changes of the visualized thoracolumbar spine.  CT ABDOMEN AND PELVIS FINDINGS  Hepatobiliary: Liver is within normal limits.  Status post cholecystectomy.  Mild prominence of the common duct, which smoothly tapers at the ampulla.  Pancreas: Pancreas is within normal limits, noting overlying vascular calcifications.  Spleen: Spleen is normal in size.  Adrenals/Urinary Tract: Adrenal glands are unremarkable.  Bilateral renal atrophy.  Right extrarenal pelvis.  No hydronephrosis.  Bladder is within normal limits.  Stomach/Bowel: Stomach is unremarkable.  No evidence of bowel obstruction.  Vascular/Lymphatic: Atherosclerotic calcifications of the abdominal aorta and branch vessels.  Abdominopelvic lymphadenopathy, including:  --1.2 cm short axis gastrohepatic node (series 2/image 50),  previously 1.4 cm  --2.1 cm short axis portacaval node (series 2/ image 56), previously 2.2 cm  --1.4 cm short axis left para-aortic node (series 2/image 61), unchanged  --1.8 cm short axis portacaval node (series 2/image 73), previously 1.4 cm  --1.2 cm short axis right pelvic sidewall node (series 2/image 23), previously 1.1 cm  Reproductive: Status post hysterectomy.  Bilateral ovaries are within normal limits.  Other: No abdominopelvic ascites.  Musculoskeletal: Degenerative changes of the lumbar spine.  IMPRESSION: Mild thoracic lymphadenopathy, stable versus minimally increased.  Abdominopelvic lymphadenopathy, including a mildly increased portacaval node, but most of which is grossly unchanged.  Stable ground-glass pulmonary nodules.  Spleen is normal in size.   Electronically Signed   By: Julian Hy M.D.   On: 10/25/2014 13:58    Labs:  CBC:  Recent Labs  05/30/14 0829 08/29/14 0939 10/25/14 0846 11/01/14 1155  WBC 3.9 4.1 3.2* 3.0*  HGB 12.3 13.0 13.4 13.6  HCT 37.3 38.8 40.6 41.0  PLT 39* 83* 97* 88*    COAGS:  Recent Labs  12/13/13 1215 11/01/14 1155  INR 0.98 1.03  APTT 27  --     BMP:  Recent Labs  12/21/13 0525 12/22/13 0530  05/02/14 0820 05/30/14 0829 08/29/14 0939 10/25/14 0846  NA 141 141  < > 143 140 141 142  K 4.1 4.0  < > 4.5 4.6 4.2 4.2  CL 109 107  --   --   --   --   --   CO2 21 22  < > 25 26 25 25   GLUCOSE 117* 115*  < > 123 78 89 98  BUN 24* 26*  < > 19.9 16.2 17.7 17.6  CALCIUM 8.8 8.9  < > 10.1 10.0 10.2 9.9  CREATININE 0.89 0.87  < > 0.9 0.9 0.8 1.0  GFRNONAA 60* 62*  --   --   --   --   --   GFRAA 70* 72*  --   --   --   --   --   < > = values in this interval not displayed.  LIVER FUNCTION TESTS:  Recent Labs  05/02/14 0820 05/30/14 0829 08/29/14 0939 10/25/14 0846  BILITOT 0.49 0.57 0.62 0.75  AST 17 22 21 23   ALT 14 17 18 21   ALKPHOS 50 60 52 49  PROT 6.0* 5.9* 5.7* 5.9*  ALBUMIN 4.0 3.9 3.9 4.2    TUMOR  MARKERS: No results for input(s): AFPTM, CEA, CA199, CHROMGRNA in the last 8760 hours.  Assessment and Plan: Angelica Sullivan is a 80 y.o. female with history of CLL who has completed chemotherapy. She presents today for port a cath removal. Details/risks of procedure d/w pt/husband with their understanding and consent.      Signed: Autumn Messing 11/01/2014, 1:56 PM

## 2014-11-01 NOTE — Discharge Instructions (Signed)
Conscious Sedation, Adult, Care After °Refer to this sheet in the next few weeks. These instructions provide you with information on caring for yourself after your procedure. Your health care provider may also give you more specific instructions. Your treatment has been planned according to current medical practices, but problems sometimes occur. Call your health care provider if you have any problems or questions after your procedure. °WHAT TO EXPECT AFTER THE PROCEDURE  °After your procedure: °· You may feel sleepy, clumsy, and have poor balance for several hours. °· Vomiting may occur if you eat too soon after the procedure. °HOME CARE INSTRUCTIONS °· Do not participate in any activities where you could become injured for at least 24 hours. Do not: °¨ Drive. °¨ Swim. °¨ Ride a bicycle. °¨ Operate heavy machinery. °¨ Cook. °¨ Use power tools. °¨ Climb ladders. °¨ Work from a high place. °· Do not make important decisions or sign legal documents until you are improved. °· If you vomit, drink water, juice, or soup when you can drink without vomiting. Make sure you have little or no nausea before eating solid foods. °· Only take over-the-counter or prescription medicines for pain, discomfort, or fever as directed by your health care provider. °· Make sure you and your family fully understand everything about the medicines given to you, including what side effects may occur. °· You should not drink alcohol, take sleeping pills, or take medicines that cause drowsiness for at least 24 hours. °· If you smoke, do not smoke without supervision. °· If you are feeling better, you may resume normal activities 24 hours after you were sedated. °· Keep all appointments with your health care provider. °SEEK MEDICAL CARE IF: °· Your skin is pale or bluish in color. °· You continue to feel nauseous or vomit. °· Your pain is getting worse and is not helped by medicine. °· You have bleeding or swelling. °· You are still sleepy or  feeling clumsy after 24 hours. °SEEK IMMEDIATE MEDICAL CARE IF: °· You develop a rash. °· You have difficulty breathing. °· You develop any type of allergic problem. °· You have a fever. °MAKE SURE YOU: °· Understand these instructions. °· Will watch your condition. °· Will get help right away if you are not doing well or get worse. °Document Released: 08/10/2013 Document Reviewed: 08/10/2013 °Exit[employer]® Patient Information ©2015 [employer]. This information is not intended to replace advice given to you by your health care provider. Make sure you discuss any questions you have with your health care provider. ° °Incision Care ° An incision is a surgical cut to open your skin. You need to take care of your incision. This helps you to not get an infection. °HOME CARE °· Only take medicine as told by your doctor. °· Do not take off your bandage (dressing) or get your incision wet until your doctor approves. Change the bandage and call your doctor if the bandage gets wet, dirty, or starts to smell. °· Take showers. Do not take baths, swim, or do anything that may soak your incision until it heals. °· Return to your normal diet and activities as told or allowed by your doctor. °· Avoid lifting any weight until your doctor approves. °· Put medicine that helps lessen itching on your incision as told by your doctor. Do not pick or scratch at your incision. °· Keep your doctor visit to have your stitches (sutures) or staples removed. °· Drink enough fluids to keep your pee (urine) clear or   pale yellow. °GET HELP RIGHT AWAY IF: °· You have a fever. °· You have a rash. °· You are dizzy, or you pass out (faint) while standing. °· You have trouble breathing. °· You have a reaction or side effects to medicine given to you. °· You have redness, puffiness (swelling), or more pain in the incision and medicine does not help. °· You have fluid, blood, or yellowish-white fluid (pus) coming from the incision lasting over 1 day. °· You  have muscle aches, chills, or you feel sick. °· You have a bad smell coming from the incision or bandage. °· Your incision opens up after stitches, staples, or sticky strips have been removed. °· You keep feeling sick to your stomach (nauseous) or keep throwing up (vomiting). °MAKE SURE YOU:  °· Understand these instructions. °· Will watch your condition. °· Will get help right away if you are not doing well or get worse. °Document Released: 01/12/2012 Document Reviewed: 12/14/2013 °ExitCare® Patient Information ©2015 [employer]. This information is not intended to replace advice given to you by your health care provider. Make sure you discuss any questions you have with your health care provider. ° °

## 2014-11-01 NOTE — Procedures (Signed)
Interventional Radiology Procedure Note  Procedure: Bedside removal of a right IJ approach single lumen PowerPort.    Complications: No immediate Recommendations:  - Ok to shower tomorrow - Do not submerge for 7 days   Signed,  Dulcy Fanny. Earleen Newport, DO

## 2014-11-01 NOTE — Progress Notes (Signed)
[facility] OFFICE PROGRESS NOTE  Patient Care Team: Precious Reel, MD as PCP - General (Internal Medicine) Heath Lark, MD as Consulting Physician (Hematology and Oncology)  SUMMARY OF ONCOLOGIC HISTORY: Oncology History   CLL stage IV, deletion 13q     CLL (chronic lymphocytic leukemia)   12/12/2013 Bone Marrow Biopsy Bone marrow biopsy show significant involvement of CLL   12/13/2013 Procedure Patient had placement of a port   12/14/2013 Imaging CT scan of the chest, abdomen and pelvis showed significant lymphadenopathy and splenomegaly   12/20/2013 - 12/22/2013 Hospital Admission The patient was admitted to the hospital to begin cycle 1 day 1 of chemotherapy due to risk of infusion reaction. She tolerated treatment well without major side effects.   12/20/2013 - 03/13/2014 Chemotherapy started on cycle 1 of Obinutuzumab, chlorambucil and prednisone. All treatment placed on hold on 03/13/2014 due to persistent pancytopenia.   03/28/2014 Imaging Repeat imaging study showed excellent response to treatment.   10/25/2014 Imaging She has persistent mild lymphadenopathy and splenomegaly, improved compared to previous exam    INTERVAL HISTORY: Please see below for problem oriented charting. She returns to review test results. In the meantime, she has recent sinus congestion but no recent infection requiring antibiotic therapy. She denies new lymphadenopathy.  REVIEW OF SYSTEMS:   Constitutional: Denies fevers, chills or abnormal weight loss Eyes: Denies blurriness of vision Ears, nose, mouth, throat, and face: Denies mucositis or sore throat Respiratory: Denies cough, dyspnea or wheezes Cardiovascular: Denies palpitation, chest discomfort or lower extremity swelling Gastrointestinal:  Denies nausea, heartburn or change in bowel habits Skin: Denies abnormal skin rashes Lymphatics: Denies new lymphadenopathy or easy bruising Neurological:Denies numbness, tingling or new  weaknesses Behavioral/Psych: Mood is stable, no new changes  All other systems were reviewed with the patient and are negative.  I have reviewed the past medical history, past surgical history, social history and family history with the patient and they are unchanged from previous note.  ALLERGIES:  is allergic to bactrim.  MEDICATIONS:  Current Outpatient Prescriptions  Medication Sig Dispense Refill  . acyclovir (ZOVIRAX) 400 MG tablet Take 1 tablet (400 mg total) by mouth daily. 90 tablet 3  . atorvastatin (LIPITOR) 20 MG tablet Take 20 mg by mouth every evening.     . calcium citrate-vitamin D (CITRACAL+D) 315-200 MG-UNIT per tablet Take 1 tablet by mouth daily.     . Cholecalciferol (VITAMIN D) 400 UNITS capsule Take 400 Units by mouth at bedtime.     . diphenhydrAMINE (BENADRYL) 25 mg capsule Take 25 mg by mouth at bedtime as needed for allergies (allergies).     . Echinacea 400 MG CAPS Take 1 capsule by mouth 2 (two) times daily.      Marland Kitchen ESTRACE VAGINAL 0.1 MG/GM vaginal cream Place 1 Applicatorful vaginally 2 (two) times a week.     . folic acid (FOLVITE) 161 MCG tablet Take 800 mcg by mouth daily.    Marland Kitchen glucosamine-chondroitin 500-400 MG tablet Take 1 tablet by mouth daily.      Marland Kitchen lidocaine-prilocaine (EMLA) cream Apply 1 application topically as needed (port access.).    Marland Kitchen loratadine (CLARITIN) 10 MG tablet Take 10 mg by mouth daily.      . montelukast (SINGULAIR) 10 MG tablet Take 10 mg by mouth daily.     . Multiple Vitamin (MULTIVITAMIN) tablet Take 1 tablet by mouth daily.      . ondansetron (ZOFRAN) 8 MG tablet Take 1 tablet (8 mg total)  by mouth every 8 (eight) hours as needed for nausea. 30 tablet 3  . polyvinyl alcohol (ARTIFICIAL TEARS) 1.4 % ophthalmic solution Place 1 drop into both eyes 3 (three) times daily as needed for dry eyes (dry eyes).     . sodium chloride (OCEAN) 0.65 % SOLN nasal spray Place 1 spray into both nostrils 2 (two) times daily.     Marland Kitchen tiZANidine  (ZANAFLEX) 4 MG tablet Take 4 mg by mouth 3 (three) times a week.    . zolpidem (AMBIEN) 5 MG tablet Take 5 mg by mouth at bedtime as needed for sleep (sleep).      No current facility-administered medications for this visit.   Facility-Administered Medications Ordered in Other Visits  Medication Dose Route Frequency Provider Last Rate Last Dose  . 0.9 %  sodium chloride infusion   Intravenous Continuous Heath Lark, MD 10 mL/hr at 11/01/14 1159    . ceFAZolin (ANCEF) IVPB 2 g/50 mL premix  2 g Intravenous Once Ascencion Dike, PA-C        PHYSICAL EXAMINATION: ECOG PERFORMANCE STATUS: 0 - Asymptomatic  Filed Vitals:   10/31/14 1214  BP: 123/49  Pulse: 64  Temp: 98.1 F (36.7 C)  Resp: 18   Filed Weights   10/31/14 1214  Weight: 117 lb 6.4 oz (53.252 kg)    GENERAL:alert, no distress and comfortable SKIN: skin color, texture, turgor are normal, no rashes or significant lesions EYES: normal, Conjunctiva are pink and non-injected, sclera clear OROPHARYNX:no exudate, no erythema and lips, buccal mucosa, and tongue normal  NECK: supple, thyroid normal size, non-tender, without nodularity LYMPH:  no palpable lymphadenopathy in the cervical, axillary or inguinal LUNGS: clear to auscultation and percussion with normal breathing effort HEART: regular rate & rhythm and no murmurs and no lower extremity edema ABDOMEN:abdomen soft, non-tender and normal bowel sounds Musculoskeletal:no cyanosis of digits and no clubbing  NEURO: alert & oriented x 3 with fluent speech, no focal motor/sensory deficits  LABORATORY DATA:  I have reviewed the data as listed    Component Value Date/Time   NA 142 10/25/2014 0846   NA 141 12/22/2013 0530   K 4.2 10/25/2014 0846   K 4.0 12/22/2013 0530   CL 107 12/22/2013 0530   CL 99 04/08/2013 0922   CO2 25 10/25/2014 0846   CO2 22 12/22/2013 0530   GLUCOSE 98 10/25/2014 0846   GLUCOSE 115* 12/22/2013 0530   GLUCOSE 92 04/08/2013 0922   BUN 17.6  10/25/2014 0846   BUN 26* 12/22/2013 0530   CREATININE 1.0 10/25/2014 0846   CREATININE 0.87 12/22/2013 0530   CALCIUM 9.9 10/25/2014 0846   CALCIUM 8.9 12/22/2013 0530   PROT 5.9* 10/25/2014 0846   PROT 5.1* 12/22/2013 0530   ALBUMIN 4.2 10/25/2014 0846   ALBUMIN 3.3* 12/22/2013 0530   AST 23 10/25/2014 0846   AST 49* 12/22/2013 0530   ALT 21 10/25/2014 0846   ALT 105* 12/22/2013 0530   ALKPHOS 49 10/25/2014 0846   ALKPHOS 49 12/22/2013 0530   BILITOT 0.75 10/25/2014 0846   BILITOT 0.7 12/22/2013 0530   GFRNONAA 62* 12/22/2013 0530   GFRAA 72* 12/22/2013 0530    No results found for: SPEP, UPEP  Lab Results  Component Value Date   WBC 3.0* 11/01/2014   NEUTROABS PENDING 11/01/2014   HGB 13.6 11/01/2014   HCT 41.0 11/01/2014   MCV 88.2 11/01/2014   PLT PENDING 11/01/2014      Chemistry      Component  Value Date/Time   NA 142 10/25/2014 0846   NA 141 12/22/2013 0530   K 4.2 10/25/2014 0846   K 4.0 12/22/2013 0530   CL 107 12/22/2013 0530   CL 99 04/08/2013 0922   CO2 25 10/25/2014 0846   CO2 22 12/22/2013 0530   BUN 17.6 10/25/2014 0846   BUN 26* 12/22/2013 0530   CREATININE 1.0 10/25/2014 0846   CREATININE 0.87 12/22/2013 0530      Component Value Date/Time   CALCIUM 9.9 10/25/2014 0846   CALCIUM 8.9 12/22/2013 0530   ALKPHOS 49 10/25/2014 0846   ALKPHOS 49 12/22/2013 0530   AST 23 10/25/2014 0846   AST 49* 12/22/2013 0530   ALT 21 10/25/2014 0846   ALT 105* 12/22/2013 0530   BILITOT 0.75 10/25/2014 0846   BILITOT 0.7 12/22/2013 0530       RADIOGRAPHIC STUDIES: I reviewed all the imaging study with her and her husband I have personally reviewed the radiological images as listed and agreed with the findings in the report.  ASSESSMENT & PLAN:  CLL (chronic lymphocytic leukemia) Overall, she tolerated treatment well. She has completed 6 cycles of chemotherapy.  I suspect that the size of the lymph node and the spleen will continue to improve. I  recommend removal of port. Plan to see her back in 6 months with repeat blood work and imaging study. I recommend she continues chlorambucil every 3 days until her current prescription is finished.   Thrombocytopenia This is likely due to recent treatment and her underlying disease.. The patient denies recent history of bleeding such as epistaxis, hematuria or hematochezia. She is asymptomatic from the low platelet count. I will observe for now.  Overall, it is improving.     Leukopenia This is likely due to recent treatment. The patient denies recent history of fevers, cough, chills, diarrhea or dysuria. She is asymptomatic from the leukopenia. I will observe for now.  I recommend she continues chlorambucil every 3 days until her current prescription is finished.    Lung infiltrate on CT This is chronic in nature and unrelated to her CLL. Continue close observation.   Orders Placed This Encounter  Procedures  . CT Chest W Contrast    Standing Status: Future     Number of Occurrences:      Standing Expiration Date: 01/31/2016    Order Specific Question:  Reason for Exam (SYMPTOM  OR DIAGNOSIS REQUIRED)    Answer:  staging CLL    Order Specific Question:  Preferred imaging location?    Answer:  [facility]  . CT Abdomen Pelvis W Contrast    Standing Status: Future     Number of Occurrences:      Standing Expiration Date: 01/31/2016    Order Specific Question:  Reason for Exam (SYMPTOM  OR DIAGNOSIS REQUIRED)    Answer:  staging CLL    Order Specific Question:  Preferred imaging location?    Answer:  [facility]  . Comprehensive metabolic panel    Standing Status: Future     Number of Occurrences:      Standing Expiration Date: 01/31/2016  . CBC with Differential    Standing Status: Future     Number of Occurrences:      Standing Expiration Date: 01/31/2016  . Lactate dehydrogenase    Standing Status: Future     Number of Occurrences:      Standing  Expiration Date: 01/31/2016   All questions were answered. The patient  knows to call the clinic with any problems, questions or concerns. No barriers to learning was detected. I spent 30 minutes counseling the patient face to face. The total time spent in the appointment was 40 minutes and more than 50% was on counseling and review of test results     [facility] [place], MD 11/01/2014 1:07 PM

## 2014-11-01 NOTE — Assessment & Plan Note (Signed)
This is likely due to recent treatment and her underlying disease.. The patient denies recent history of bleeding such as epistaxis, hematuria or hematochezia. She is asymptomatic from the low platelet count. I will observe for now.  Overall, it is improving.

## 2014-11-01 NOTE — Assessment & Plan Note (Addendum)
Overall, she tolerated treatment well. She has completed 6 cycles of chemotherapy.  I suspect that the size of the lymph node and the spleen will continue to improve. I recommend removal of port. Plan to see her back in 6 months with repeat blood work and imaging study. I recommend she continues chlorambucil every 3 days until her current prescription is finished.

## 2014-11-01 NOTE — Assessment & Plan Note (Signed)
This is likely due to recent treatment. The patient denies recent history of fevers, cough, chills, diarrhea or dysuria. She is asymptomatic from the leukopenia. I will observe for now.  I recommend she continues chlorambucil every 3 days until her current prescription is finished.

## 2015-03-05 ENCOUNTER — Telehealth: Payer: Self-pay | Admitting: *Deleted

## 2015-03-05 NOTE — Telephone Encounter (Signed)
PER DR.GORSUCH- NOTHING TO ADD. WILL SEE PT. NEXT MONTH AND WE WILL KEEP THE SAME. NOTIFIED PT. SHE VOICES UNDERSTANDING.

## 2015-03-05 NOTE — Telephone Encounter (Signed)
I am sorry to hear that I have nothing to add She has appt to see me next month and we will keep the same

## 2015-03-05 NOTE — Telephone Encounter (Signed)
PT. WAS GIVEN ANTIVIRAL AND PAIN MEDICATION. SHE ASKED IF THERE WAS ANYTHING "SPECIAL" THAT NEEDED TO BE DONE.

## 2015-04-23 ENCOUNTER — Other Ambulatory Visit: Payer: Medicare Other

## 2015-04-23 ENCOUNTER — Other Ambulatory Visit (HOSPITAL_BASED_OUTPATIENT_CLINIC_OR_DEPARTMENT_OTHER): Payer: Medicare Other

## 2015-04-23 ENCOUNTER — Encounter (HOSPITAL_COMMUNITY): Payer: Self-pay

## 2015-04-23 ENCOUNTER — Ambulatory Visit (HOSPITAL_COMMUNITY)
Admission: RE | Admit: 2015-04-23 | Discharge: 2015-04-23 | Disposition: A | Discharge status: Home / Self Care | Payer: Medicare Other | Source: Ambulatory Visit | Attending: Hematology and Oncology | Admitting: Hematology and Oncology

## 2015-04-23 DIAGNOSIS — C9110 Chronic lymphocytic leukemia of B-cell type not having achieved remission: Secondary | ICD-10-CM | POA: Insufficient documentation

## 2015-04-23 DIAGNOSIS — D72819 Decreased white blood cell count, unspecified: Secondary | ICD-10-CM | POA: Diagnosis not present

## 2015-04-23 DIAGNOSIS — D696 Thrombocytopenia, unspecified: Secondary | ICD-10-CM

## 2015-04-23 LAB — COMPREHENSIVE METABOLIC PANEL (CC13)
ALK PHOS: 47 U/L (ref 40–150)
ALT: 23 U/L (ref 0–55)
AST: 29 U/L (ref 5–34)
Albumin: 4.7 g/dL (ref 3.5–5.0)
Anion Gap: 7 mEq/L (ref 3–11)
BUN: 14.5 mg/dL (ref 7.0–26.0)
CALCIUM: 10.6 mg/dL — AB (ref 8.4–10.4)
CHLORIDE: 105 meq/L (ref 98–109)
CO2: 29 mEq/L (ref 22–29)
Creatinine: 1 mg/dL (ref 0.6–1.1)
EGFR: 54 mL/min/{1.73_m2} — ABNORMAL LOW (ref >90)
Glucose: 100 mg/dl (ref 70–140)
POTASSIUM: 4.8 meq/L (ref 3.5–5.1)
Sodium: 141 mEq/L (ref 136–145)
Total Bilirubin: 1.34 mg/dL — ABNORMAL HIGH (ref 0.20–1.20)
Total Protein: 6.3 g/dL — ABNORMAL LOW (ref 6.4–8.3)

## 2015-04-23 LAB — CBC WITH DIFFERENTIAL/PLATELET
BASO%: 1.7 % (ref 0.0–2.0)
BASOS ABS: 0.1 10*3/uL (ref 0.0–0.1)
EOS%: 1.5 % (ref 0.0–7.0)
Eosinophils Absolute: 0.1 10*3/uL (ref 0.0–0.5)
HCT: 41.7 % (ref 34.8–46.6)
HGB: 13.9 g/dL (ref 11.6–15.9)
LYMPH#: 3.9 10*3/uL — AB (ref 0.9–3.3)
LYMPH%: 72.9 % — ABNORMAL HIGH (ref 14.0–49.7)
MCH: 30 pg (ref 25.1–34.0)
MCHC: 33.3 g/dL (ref 31.5–36.0)
MCV: 90 fL (ref 79.5–101.0)
MONO#: 0.8 10*3/uL (ref 0.1–0.9)
MONO%: 14.5 % — ABNORMAL HIGH (ref 0.0–14.0)
NEUT#: 0.5 10*3/uL — CL (ref 1.5–6.5)
NEUT%: 9.4 % — ABNORMAL LOW (ref 38.4–76.8)
PLATELETS: 77 10*3/uL — AB (ref 145–400)
RBC: 4.63 10*6/uL (ref 3.70–5.45)
RDW: 14.6 % — ABNORMAL HIGH (ref 11.2–14.5)
WBC: 5.3 10*3/uL (ref 3.9–10.3)

## 2015-04-23 LAB — LACTATE DEHYDROGENASE (CC13): LDH: 209 U/L (ref 125–245)

## 2015-04-23 MED ORDER — IOHEXOL 300 MG/ML  SOLN
100.0000 mL | Freq: Once | INTRAMUSCULAR | Status: AC | PRN
  Administered 2015-04-23: 100 mL via INTRAVENOUS

## 2015-04-24 ENCOUNTER — Telehealth: Payer: Self-pay

## 2015-04-24 ENCOUNTER — Encounter: Payer: Self-pay | Admitting: Hematology and Oncology

## 2015-04-24 ENCOUNTER — Ambulatory Visit (HOSPITAL_BASED_OUTPATIENT_CLINIC_OR_DEPARTMENT_OTHER): Payer: Medicare Other | Admitting: Hematology and Oncology

## 2015-04-24 ENCOUNTER — Other Ambulatory Visit: Payer: Self-pay | Admitting: *Deleted

## 2015-04-24 VITALS — BP 112/36 | HR 72 | Temp 98.2°F | Resp 18 | Ht 60.0 in | Wt 115.4 lb

## 2015-04-24 DIAGNOSIS — C9110 Chronic lymphocytic leukemia of B-cell type not having achieved remission: Secondary | ICD-10-CM | POA: Diagnosis not present

## 2015-04-24 DIAGNOSIS — D709 Neutropenia, unspecified: Secondary | ICD-10-CM | POA: Diagnosis not present

## 2015-04-24 DIAGNOSIS — R161 Splenomegaly, not elsewhere classified: Secondary | ICD-10-CM | POA: Diagnosis not present

## 2015-04-24 DIAGNOSIS — D6959 Other secondary thrombocytopenia: Secondary | ICD-10-CM

## 2015-04-24 DIAGNOSIS — D696 Thrombocytopenia, unspecified: Secondary | ICD-10-CM

## 2015-04-24 MED ORDER — ACYCLOVIR 400 MG PO TABS: 400.0000 mg | ORAL_TABLET | Freq: Every day | ORAL | Status: DC

## 2015-04-24 MED ORDER — IBRUTINIB 140 MG PO CAPS: 420.0000 mg | ORAL_CAPSULE | Freq: Every day | ORAL | Status: DC

## 2015-04-24 NOTE — Assessment & Plan Note (Signed)
She has profound neutropenia and recent infection. I emphasized neutropenic precaution. I will restart acyclovir 400 mg daily.

## 2015-04-24 NOTE — Assessment & Plan Note (Signed)
The cause is related to splenomegaly and her underlying disease. The patient denies recent history of bleeding such as epistaxis, hematuria or hematochezia. She is asymptomatic from the thrombocytopenia. I will observe for now.

## 2015-04-24 NOTE — Telephone Encounter (Signed)
New rx for Ibrutinib faxed to [facility] outpatient pharmacy.

## 2015-04-24 NOTE — Progress Notes (Signed)
[facility] OFFICE PROGRESS NOTE  Patient Care Team: Shon Baton, MD as PCP - General (Internal Medicine) Heath Lark, MD as Consulting Physician (Hematology and Oncology)  SUMMARY OF ONCOLOGIC HISTORY: Oncology History   CLL stage IV, deletion 13q     CLL (chronic lymphocytic leukemia)   12/12/2013 Bone Marrow Biopsy Bone marrow biopsy show significant involvement of CLL   12/13/2013 Procedure Patient had placement of a port   12/14/2013 Imaging CT scan of the chest, abdomen and pelvis showed significant lymphadenopathy and splenomegaly   12/20/2013 - 12/22/2013 Hospital Admission The patient was admitted to the hospital to begin cycle 1 day 1 of chemotherapy due to risk of infusion reaction. She tolerated treatment well without major side effects.   12/20/2013 - 03/13/2014 Chemotherapy started on cycle 1 of Obinutuzumab, chlorambucil and prednisone. All treatment placed on hold on 03/13/2014 due to persistent pancytopenia.   03/28/2014 Imaging Repeat imaging study showed excellent response to treatment.   10/25/2014 Imaging She has persistent mild lymphadenopathy and splenomegaly, improved compared to previous exam   04/23/2015 Imaging Ct scan showed disease progression    INTERVAL HISTORY: Please see below for problem oriented charting. She had recent shingles infection several months ago. She complained of very mild persistent neuropathy at the site of infection. Denies other infection. She had mild nonproductive cough. She bruises easily. The patient denies any recent signs or symptoms of bleeding such as spontaneous epistaxis, hematuria or hematochezia.   REVIEW OF SYSTEMS:   Constitutional: Denies fevers, chills or abnormal weight loss Eyes: Denies blurriness of vision Ears, nose, mouth, throat, and face: Denies mucositis or sore throat Cardiovascular: Denies palpitation, chest discomfort or lower extremity swelling Gastrointestinal:  Denies nausea, heartburn or change  in bowel habits Skin: Denies abnormal skin rashes Lymphatics: Denies new lymphadenopathy  Neurological:Denies numbness, tingling or new weaknesses Behavioral/Psych: Mood is stable, no new changes  All other systems were reviewed with the patient and are negative.  I have reviewed the past medical history, past surgical history, social history and family history with the patient and they are unchanged from previous note.  ALLERGIES:  is allergic to bactrim.  MEDICATIONS:  Current Outpatient Prescriptions  Medication Sig Dispense Refill  . acyclovir (ZOVIRAX) 400 MG tablet Take 1 tablet (400 mg total) by mouth daily. 90 tablet 3  . atorvastatin (LIPITOR) 20 MG tablet Take 20 mg by mouth every evening.     . calcium citrate-vitamin D (CITRACAL+D) 315-200 MG-UNIT per tablet Take 1 tablet by mouth daily.     . Cholecalciferol (VITAMIN D) 400 UNITS capsule Take 400 Units by mouth at bedtime.     . diphenhydrAMINE (BENADRYL) 25 mg capsule Take 25 mg by mouth at bedtime as needed for allergies (allergies).     . Echinacea 400 MG CAPS Take 1 capsule by mouth 2 (two) times daily.      Marland Kitchen ESTRACE VAGINAL 0.1 MG/GM vaginal cream Place 1 Applicatorful vaginally 2 (two) times a week.     . folic acid (FOLVITE) 825 MCG tablet Take 800 mcg by mouth daily.    Marland Kitchen glucosamine-chondroitin 500-400 MG tablet Take 1 tablet by mouth daily.      Marland Kitchen ibrutinib (IMBRUVICA) 140 MG capsul Take 3 capsules (420 mg total) by mouth daily. 90 capsule 6  . lidocaine-prilocaine (EMLA) cream Apply 1 application topically as needed (port access.).    Marland Kitchen loratadine (CLARITIN) 10 MG tablet Take 10 mg by mouth daily.      Marland Kitchen  montelukast (SINGULAIR) 10 MG tablet Take 10 mg by mouth daily.     . Multiple Vitamin (MULTIVITAMIN) tablet Take 1 tablet by mouth daily.      . ondansetron (ZOFRAN) 8 MG tablet Take 1 tablet (8 mg total) by mouth every 8 (eight) hours as needed for nausea. 30 tablet 3  . polyvinyl alcohol (ARTIFICIAL TEARS) 1.4  % ophthalmic solution Place 1 drop into both eyes 3 (three) times daily as needed for dry eyes (dry eyes).     . sodium chloride (OCEAN) 0.65 % SOLN nasal spray Place 1 spray into both nostrils 2 (two) times daily.     Marland Kitchen tiZANidine (ZANAFLEX) 4 MG tablet Take 4 mg by mouth 3 (three) times a week.    . zolpidem (AMBIEN) 5 MG tablet Take 5 mg by mouth at bedtime as needed for sleep (sleep).      No current facility-administered medications for this visit.    PHYSICAL EXAMINATION: ECOG PERFORMANCE STATUS: 1 - Symptomatic but completely ambulatory  Filed Vitals:   04/24/15 0917  BP: 112/36  Pulse: 72  Temp: 98.2 F (36.8 C)  Resp: 18   Filed Weights   04/24/15 0917  Weight: 115 lb 6.4 oz (52.345 kg)    GENERAL:alert, no distress and comfortable SKIN: skin color, texture, turgor are normal, no rashes or significant lesions. She has skin bruises. She has healed blisters on her left flank from recent shingles EYES: normal, Conjunctiva are pink and non-injected, sclera clear OROPHARYNX:no exudate, no erythema and lips, buccal mucosa, and tongue normal  NECK: supple, thyroid normal size, non-tender, without nodularity LYMPH:  Palpable lymphadenopathy in the cervical, axillary and inguinal region  LUNGS: clear to auscultation and percussion with normal breathing effort HEART: regular rate & rhythm and no murmurs and no lower extremity edema ABDOMEN:abdomen soft, non-tender and normal bowel sounds. Splenomegaly is noted. Musculoskeletal:no cyanosis of digits and no clubbing  NEURO: alert & oriented x 3 with fluent speech, no focal motor/sensory deficits  LABORATORY DATA:  I have reviewed the data as listed    Component Value Date/Time   NA 141 04/23/2015 0921   NA 141 12/22/2013 0530   K 4.8 04/23/2015 0921   K 4.0 12/22/2013 0530   CL 107 12/22/2013 0530   CL 99 04/08/2013 0922   CO2 29 04/23/2015 0921   CO2 22 12/22/2013 0530   GLUCOSE 100 04/23/2015 0921   GLUCOSE 115*  12/22/2013 0530   GLUCOSE 92 04/08/2013 0922   BUN 14.5 04/23/2015 0921   BUN 26* 12/22/2013 0530   CREATININE 1.0 04/23/2015 0921   CREATININE 0.87 12/22/2013 0530   CALCIUM 10.6* 04/23/2015 0921   CALCIUM 8.9 12/22/2013 0530   PROT 6.3* 04/23/2015 0921   PROT 5.1* 12/22/2013 0530   ALBUMIN 4.7 04/23/2015 0921   ALBUMIN 3.3* 12/22/2013 0530   AST 29 04/23/2015 0921   AST 49* 12/22/2013 0530   ALT 23 04/23/2015 0921   ALT 105* 12/22/2013 0530   ALKPHOS 47 04/23/2015 0921   ALKPHOS 49 12/22/2013 0530   BILITOT 1.34* 04/23/2015 0921   BILITOT 0.7 12/22/2013 0530   GFRNONAA 62* 12/22/2013 0530   GFRAA 72* 12/22/2013 0530    No results found for: SPEP, UPEP  Lab Results  Component Value Date   WBC 5.3 04/23/2015   NEUTROABS 0.5* 04/23/2015   HGB 13.9 04/23/2015   HCT 41.7 04/23/2015   MCV 90.0 04/23/2015   PLT 77* 04/23/2015      Chemistry  Component Value Date/Time   NA 141 04/23/2015 0921   NA 141 12/22/2013 0530   K 4.8 04/23/2015 0921   K 4.0 12/22/2013 0530   CL 107 12/22/2013 0530   CL 99 04/08/2013 0922   CO2 29 04/23/2015 0921   CO2 22 12/22/2013 0530   BUN 14.5 04/23/2015 0921   BUN 26* 12/22/2013 0530   CREATININE 1.0 04/23/2015 0921   CREATININE 0.87 12/22/2013 0530      Component Value Date/Time   CALCIUM 10.6* 04/23/2015 0921   CALCIUM 8.9 12/22/2013 0530   ALKPHOS 47 04/23/2015 0921   ALKPHOS 49 12/22/2013 0530   AST 29 04/23/2015 0921   AST 49* 12/22/2013 0530   ALT 23 04/23/2015 0921   ALT 105* 12/22/2013 0530   BILITOT 1.34* 04/23/2015 0921   BILITOT 0.7 12/22/2013 0530       RADIOGRAPHIC STUDIES: I have personally reviewed the radiological images as listed and agreed with the findings in the report. Ct Chest W Contrast  04/23/2015   CLINICAL DATA:  Restaging CLL  EXAM: CT CHEST, ABDOMEN, AND PELVIS WITH CONTRAST  TECHNIQUE: Multidetector CT imaging of the chest, abdomen and pelvis was performed following the standard protocol  during bolus administration of intravenous contrast.  CONTRAST:  119m OMNIPAQUE IOHEXOL 300 MG/ML  SOLN  COMPARISON:  10/25/2014  FINDINGS: CT CHEST FINDINGS  Mediastinum: The heart size appears within normal limits. There is no pericardial effusion identified. The trachea is patent and is midline. On unremarkable appearance of the esophagus. Calcified mediastinal lymph node is again noted and appears similar to previous exam. There is no mediastinal or hilar adenopathy. Bilateral axillary adenopathy is again identified. Index right axillary node measures 1.3 cm, image 11/ series 2. Previously 1 cm peer the index left axillary lymph node measures 1.1 cm, image 13/series 2. Previously 7 mm. The index left supraclavicular lymph node measures 7 mm, image 6/series 2. Previously this measured the same.  Lungs/Pleura: No pleural effusions identified. There is no airspace consolidation or atelectasis. Faint area of ground-glass attenuation within the right upper lobe is unchanged measuring 2.4 cm, image 12/series 4. Subpleural nodule within the posterior lateral right lower lobe is unchanged measuring 7 mm, image 39/series 4. Previous index nodule in the left upper lobe is not seen on today's exam.  Musculoskeletal: There are no aggressive lytic or sclerotic bone lesions identified.  CT ABDOMEN AND PELVIS FINDINGS  Hepatobiliary: No suspicious liver abnormality. Previous cholecystectomy. No biliary dilatation.  Pancreas: Unremarkable appearance of the pancreas.  Spleen: The spleen measures 13 cm in cranial caudal dimension. Previously 10 cm.  Adrenals/Urinary Tract: Normal appearance of the adrenal glands. The kidneys are on unremarkable. Urinary bladder is normal.  Stomach/Bowel: The stomach is within normal limits. The small bowel loops have a normal course and caliber. No obstruction. Normal appearance of the colon.  Vascular/Lymphatic: Aortic atherosclerosis noted. No aneurysm. Extensive upper abdominal and pelvic  adenopathy is again noted. The index portacaval lymph node measured 3.3 cm, image 56/ series 2. Previously 2.1 cm. The index periaortic lymph node measures 1.8 cm, image 62/series 2. Previously 1.4 cm. Index left periaortic lymph node at the level of the left renal hilum measures 2.4 cm, image 64/ series 2. Previously 1.5 cm. Index aortocaval lymph node measures 2.5 cm, image 74/series 2. Previously 1.8 cm. Right common iliac lymph node measures 1.9 cm, image 79/series 2. Previously 1.5 cm. Right pelvic sidewall lymph node measures 1.5 cm, image 93/ series 2. Previously 1.2 cm.  Within the left inguinal region there is a 1.9 cm lymph node, image 94/series 2. Previously this measured 0.8 cm.  Reproductive: Previous hysterectomy.  No adnexal mass.  Other: There is no ascites or focal fluid collections within the abdomen or pelvis.  Musculoskeletal: There is no aggressive lytic or sclerotic bone lesions identified.  IMPRESSION: 1. Interval progression of adenopathy within the chest abdomen and pelvis. 2. Splenomegaly.  Increased from previous exam. 3. Aortic atherosclerosis. 4. Pulmonary nodules are unchanged from previous exam.   Electronically Signed   By: Kerby Moors M.D.   On: 04/23/2015 11:47   Ct Abdomen Pelvis W Contrast  04/23/2015   CLINICAL DATA:  Restaging CLL  EXAM: CT CHEST, ABDOMEN, AND PELVIS WITH CONTRAST  TECHNIQUE: Multidetector CT imaging of the chest, abdomen and pelvis was performed following the standard protocol during bolus administration of intravenous contrast.  CONTRAST:  169m OMNIPAQUE IOHEXOL 300 MG/ML  SOLN  COMPARISON:  10/25/2014  FINDINGS: CT CHEST FINDINGS  Mediastinum: The heart size appears within normal limits. There is no pericardial effusion identified. The trachea is patent and is midline. On unremarkable appearance of the esophagus. Calcified mediastinal lymph node is again noted and appears similar to previous exam. There is no mediastinal or hilar adenopathy. Bilateral  axillary adenopathy is again identified. Index right axillary node measures 1.3 cm, image 11/ series 2. Previously 1 cm peer the index left axillary lymph node measures 1.1 cm, image 13/series 2. Previously 7 mm. The index left supraclavicular lymph node measures 7 mm, image 6/series 2. Previously this measured the same.  Lungs/Pleura: No pleural effusions identified. There is no airspace consolidation or atelectasis. Faint area of ground-glass attenuation within the right upper lobe is unchanged measuring 2.4 cm, image 12/series 4. Subpleural nodule within the posterior lateral right lower lobe is unchanged measuring 7 mm, image 39/series 4. Previous index nodule in the left upper lobe is not seen on today's exam.  Musculoskeletal: There are no aggressive lytic or sclerotic bone lesions identified.  CT ABDOMEN AND PELVIS FINDINGS  Hepatobiliary: No suspicious liver abnormality. Previous cholecystectomy. No biliary dilatation.  Pancreas: Unremarkable appearance of the pancreas.  Spleen: The spleen measures 13 cm in cranial caudal dimension. Previously 10 cm.  Adrenals/Urinary Tract: Normal appearance of the adrenal glands. The kidneys are on unremarkable. Urinary bladder is normal.  Stomach/Bowel: The stomach is within normal limits. The small bowel loops have a normal course and caliber. No obstruction. Normal appearance of the colon.  Vascular/Lymphatic: Aortic atherosclerosis noted. No aneurysm. Extensive upper abdominal and pelvic adenopathy is again noted. The index portacaval lymph node measured 3.3 cm, image 56/ series 2. Previously 2.1 cm. The index periaortic lymph node measures 1.8 cm, image 62/series 2. Previously 1.4 cm. Index left periaortic lymph node at the level of the left renal hilum measures 2.4 cm, image 64/ series 2. Previously 1.5 cm. Index aortocaval lymph node measures 2.5 cm, image 74/series 2. Previously 1.8 cm. Right common iliac lymph node measures 1.9 cm, image 79/series 2. Previously  1.5 cm. Right pelvic sidewall lymph node measures 1.5 cm, image 93/ series 2. Previously 1.2 cm. Within the left inguinal region there is a 1.9 cm lymph node, image 94/series 2. Previously this measured 0.8 cm.  Reproductive: Previous hysterectomy.  No adnexal mass.  Other: There is no ascites or focal fluid collections within the abdomen or pelvis.  Musculoskeletal: There is no aggressive lytic or sclerotic bone lesions identified.  IMPRESSION: 1. Interval progression of adenopathy within  the chest abdomen and pelvis. 2. Splenomegaly.  Increased from previous exam. 3. Aortic atherosclerosis. 4. Pulmonary nodules are unchanged from previous exam.   Electronically Signed   By: Kerby Moors M.D.   On: 04/23/2015 11:47     ASSESSMENT & PLAN:  CLL (chronic lymphocytic leukemia) Unfortunately, she had clinical disease progression on laboratory blood work and imaging study. She is also symptomatic with worsening pancytopenia and recurrent infection. She will need to be treated with second line treatment. I discussed with her current NCCN guidelines. Ibrutinib has been now approve as first-line treatment for frail elderly patient with CLL. I discussed with her the risks, benefit, side effects of Ibrutinib and she agreed to proceed. She will be out of town soon. Hopefully, we can start her treatment around 05/07/2015. As soon as the patient receives her medication, she will give me a call and I will bring her back for further follow-up and toxicity review within 7 to 10 days of start of treatment.  Neutropenia She has profound neutropenia and recent infection. I emphasized neutropenic precaution. I will restart acyclovir 400 mg daily.  Thrombocytopenia The cause is related to splenomegaly and her underlying disease. The patient denies recent history of bleeding such as epistaxis, hematuria or hematochezia. She is asymptomatic from the thrombocytopenia. I will observe for now.    No orders of the  defined types were placed in this encounter.   All questions were answered. The patient knows to call the clinic with any problems, questions or concerns. No barriers to learning was detected. I spent 30 minutes counseling the patient face to face. The total time spent in the appointment was 40 minutes and more than 50% was on counseling and review of test results     [facility], [place], MD 04/24/2015 9:48 AM

## 2015-04-24 NOTE — Assessment & Plan Note (Signed)
Unfortunately, she had clinical disease progression on laboratory blood work and imaging study. She is also symptomatic with worsening pancytopenia and recurrent infection. She will need to be treated with second line treatment. I discussed with her current NCCN guidelines. Ibrutinib has been now approve as first-line treatment for frail elderly patient with CLL. I discussed with her the risks, benefit, side effects of Ibrutinib and she agreed to proceed. She will be out of town soon. Hopefully, we can start her treatment around 05/07/2015. As soon as the patient receives her medication, she will give me a call and I will bring her back for further follow-up and toxicity review within 7 to 10 days of start of treatment.

## 2015-04-24 NOTE — Progress Notes (Signed)
I faxed prior auth for imbruvica to [facility]

## 2015-04-24 NOTE — Telephone Encounter (Signed)
Sent prior authorization request form from  [place] for [place] to [employer].

## 2015-04-25 ENCOUNTER — Encounter: Payer: Self-pay | Admitting: Hematology and Oncology

## 2015-04-25 NOTE — Progress Notes (Signed)
Per [facility] imbruvica  140mg  capsule is approved 04/24/15-04/23/16  [id number]

## 2015-04-26 ENCOUNTER — Encounter: Payer: Self-pay | Admitting: Hematology and Oncology

## 2015-04-26 NOTE — Progress Notes (Signed)
Per [facility]--- [facility] approved asst 0.00 copay and scrip imbruvica is ready for pick up

## 2015-05-08 ENCOUNTER — Telehealth: Payer: Self-pay | Admitting: *Deleted

## 2015-05-08 NOTE — Telephone Encounter (Signed)
Received call earlier from pt asking about imbruvica S.E.  She has the drug & plans to start tomorrow.  Informed to call if any concerns especially any bleeding/bruising, signs of infection, heart arrhymias, chest pain, or dizziness, & any trouble passing urine.  Informed she will need an MD & probably lab f/u appt 7-10 days after starting this med & will leave Dr. Alvy Bimler a message to see where she wants to see her.  Pt expressed understanding & knows to call with problems.

## 2015-05-09 ENCOUNTER — Telehealth: Payer: Self-pay | Admitting: Hematology and Oncology

## 2015-05-09 ENCOUNTER — Other Ambulatory Visit: Payer: Self-pay | Admitting: Hematology and Oncology

## 2015-05-09 DIAGNOSIS — C9110 Chronic lymphocytic leukemia of B-cell type not having achieved remission: Secondary | ICD-10-CM

## 2015-05-09 NOTE — Telephone Encounter (Signed)
returned call and confirmed appt....pt ok adn aware °

## 2015-05-09 NOTE — Telephone Encounter (Signed)
lvm for pt regarding to July appt. °

## 2015-05-18 ENCOUNTER — Ambulatory Visit (HOSPITAL_BASED_OUTPATIENT_CLINIC_OR_DEPARTMENT_OTHER): Payer: Medicare Other | Admitting: Hematology and Oncology

## 2015-05-18 ENCOUNTER — Telehealth: Payer: Self-pay | Admitting: Hematology and Oncology

## 2015-05-18 ENCOUNTER — Encounter: Payer: Self-pay | Admitting: Hematology and Oncology

## 2015-05-18 ENCOUNTER — Other Ambulatory Visit (HOSPITAL_BASED_OUTPATIENT_CLINIC_OR_DEPARTMENT_OTHER): Payer: Medicare Other

## 2015-05-18 VITALS — BP 151/43 | HR 62 | Temp 97.4°F | Resp 18 | Ht 60.0 in | Wt 117.1 lb

## 2015-05-18 DIAGNOSIS — D696 Thrombocytopenia, unspecified: Secondary | ICD-10-CM

## 2015-05-18 DIAGNOSIS — J310 Chronic rhinitis: Secondary | ICD-10-CM

## 2015-05-18 DIAGNOSIS — C9110 Chronic lymphocytic leukemia of B-cell type not having achieved remission: Secondary | ICD-10-CM

## 2015-05-18 LAB — COMPREHENSIVE METABOLIC PANEL (CC13)
ALT: 17 U/L (ref 0–55)
ANION GAP: 7 meq/L (ref 3–11)
AST: 22 U/L (ref 5–34)
Albumin: 3.9 g/dL (ref 3.5–5.0)
Alkaline Phosphatase: 43 U/L (ref 40–150)
BUN: 16.5 mg/dL (ref 7.0–26.0)
CO2: 27 mEq/L (ref 22–29)
CREATININE: 0.8 mg/dL (ref 0.6–1.1)
Calcium: 9.9 mg/dL (ref 8.4–10.4)
Chloride: 102 mEq/L (ref 98–109)
EGFR: 75 mL/min/{1.73_m2} — ABNORMAL LOW (ref >90)
GLUCOSE: 86 mg/dL (ref 70–140)
Potassium: 5.1 mEq/L (ref 3.5–5.1)
Sodium: 135 mEq/L — ABNORMAL LOW (ref 136–145)
Total Bilirubin: 1.12 mg/dL (ref 0.20–1.20)
Total Protein: 5.7 g/dL — ABNORMAL LOW (ref 6.4–8.3)

## 2015-05-18 LAB — TECHNOLOGIST REVIEW

## 2015-05-18 LAB — CBC WITH DIFFERENTIAL/PLATELET
BASO%: 0.3 % (ref 0.0–2.0)
BASOS ABS: 0.1 10*3/uL (ref 0.0–0.1)
EOS%: 0.7 % (ref 0.0–7.0)
Eosinophils Absolute: 0.3 10*3/uL (ref 0.0–0.5)
HCT: 38.9 % (ref 34.8–46.6)
HGB: 12.6 g/dL (ref 11.6–15.9)
LYMPH%: 84.5 % — ABNORMAL HIGH (ref 14.0–49.7)
MCH: 29.8 pg (ref 25.1–34.0)
MCHC: 32.5 g/dL (ref 31.5–36.0)
MCV: 91.7 fL (ref 79.5–101.0)
MONO#: 1.5 10*3/uL — ABNORMAL HIGH (ref 0.1–0.9)
MONO%: 4 % (ref 0.0–14.0)
NEUT#: 4.1 10*3/uL (ref 1.5–6.5)
NEUT%: 10.5 % — AB (ref 38.4–76.8)
Platelets: 83 10*3/uL — ABNORMAL LOW (ref 145–400)
RBC: 4.24 10*6/uL (ref 3.70–5.45)
RDW: 14.5 % (ref 11.2–14.5)
WBC: 38.7 10*3/uL — ABNORMAL HIGH (ref 3.9–10.3)
lymph#: 32.7 10*3/uL — ABNORMAL HIGH (ref 0.9–3.3)

## 2015-05-18 LAB — LACTATE DEHYDROGENASE (CC13): LDH: 181 U/L (ref 125–245)

## 2015-05-18 NOTE — Assessment & Plan Note (Signed)
This is chronic in nature likely related to allergies. Clinically, she does not appear to have infection.

## 2015-05-18 NOTE — Telephone Encounter (Signed)
per pof to sch pt appt-gave pt copy of avs °

## 2015-05-18 NOTE — Assessment & Plan Note (Signed)
The cause is related to splenomegaly and her underlying disease. The patient denies recent history of bleeding such as epistaxis, hematuria or hematochezia. She is asymptomatic from the thrombocytopenia. I will observe for now.

## 2015-05-18 NOTE — Assessment & Plan Note (Signed)
She tolerated treatment well. She has transient lymphocytosis which is not an uncommon finding with the chemotherapy. I recommend she continue at same dose without dose adjustment. Neutropenia has resolved. Thrombocytopenia is improving. Plan to see her back within a month for further follow-up

## 2015-05-18 NOTE — Progress Notes (Signed)
[place] OFFICE PROGRESS NOTE  Patient Care Team: Shon Baton, MD as PCP - General (Internal Medicine) Heath Lark, MD as Consulting Physician (Hematology and Oncology)  SUMMARY OF ONCOLOGIC HISTORY: Oncology History   CLL stage IV, deletion 13q     CLL (chronic lymphocytic leukemia)   12/12/2013 Bone Marrow Biopsy Bone marrow biopsy show significant involvement of CLL   12/13/2013 Procedure Patient had placement of a port   12/14/2013 Imaging CT scan of the chest, abdomen and pelvis showed significant lymphadenopathy and splenomegaly   12/20/2013 - 12/22/2013 Hospital Admission The patient was admitted to the hospital to begin cycle 1 day 1 of chemotherapy due to risk of infusion reaction. She tolerated treatment well without major side effects.   12/20/2013 - 03/13/2014 Chemotherapy started on cycle 1 of Obinutuzumab, chlorambucil and prednisone. All treatment placed on hold on 03/13/2014 due to persistent pancytopenia.   03/28/2014 Imaging Repeat imaging study showed excellent response to treatment.   10/25/2014 Imaging She has persistent mild lymphadenopathy and splenomegaly, improved compared to previous exam   04/23/2015 Imaging Ct scan showed disease progression   05/09/2015 -  Chemotherapy She started taking ibrutinib.    INTERVAL HISTORY: Please see below for problem oriented charting. She feels well. She complained of mild joint pain in the first week of treatment. That has resolved. She continues a persistent nasal drainage. Denies diarrhea. The patient denies any recent signs or symptoms of bleeding such as spontaneous epistaxis, hematuria or hematochezia.   REVIEW OF SYSTEMS:   Constitutional: Denies fevers, chills or abnormal weight loss Eyes: Denies blurriness of vision Ears, nose, mouth, throat, and face: Denies mucositis or sore throat Respiratory: Denies cough, dyspnea or wheezes Cardiovascular: Denies palpitation, chest discomfort or lower extremity  swelling Gastrointestinal:  Denies nausea, heartburn or change in bowel habits Skin: Denies abnormal skin rashes Lymphatics: Denies new lymphadenopathy or easy bruising Neurological:Denies numbness, tingling or new weaknesses Behavioral/Psych: Mood is stable, no new changes  All other systems were reviewed with the patient and are negative.  I have reviewed the past medical history, past surgical history, social history and family history with the patient and they are unchanged from previous note.  ALLERGIES:  is allergic to bactrim.  MEDICATIONS:  Current Outpatient Prescriptions  Medication Sig Dispense Refill  . acyclovir (ZOVIRAX) 400 MG tablet Take 1 tablet (400 mg total) by mouth daily. 90 tablet 3  . atorvastatin (LIPITOR) 20 MG tablet Take 20 mg by mouth every evening.     . calcium citrate-vitamin D (CITRACAL+D) 315-200 MG-UNIT per tablet Take 1 tablet by mouth daily.     . Cholecalciferol (VITAMIN D) 400 UNITS capsule Take 400 Units by mouth at bedtime.     . diphenhydrAMINE (BENADRYL) 25 mg capsule Take 25 mg by mouth at bedtime as needed for allergies (allergies).     . Echinacea 400 MG CAPS Take 1 capsule by mouth 2 (two) times daily.      Marland Kitchen ESTRACE VAGINAL 0.1 MG/GM vaginal cream Place 1 Applicatorful vaginally 2 (two) times a week.     Marland Kitchen glucosamine-chondroitin 500-400 MG tablet Take 1 tablet by mouth daily.      Marland Kitchen ibrutinib (IMBRUVICA) 140 MG capsul Take 3 capsules (420 mg total) by mouth daily. 90 capsule 6  . lidocaine-prilocaine (EMLA) cream Apply 1 application topically as needed (port access.).    Marland Kitchen loratadine (CLARITIN) 10 MG tablet Take 10 mg by mouth daily.      . montelukast (SINGULAIR) 10  MG tablet Take 10 mg by mouth daily.     . Multiple Vitamin (MULTIVITAMIN) tablet Take 1 tablet by mouth daily.      . ondansetron (ZOFRAN) 8 MG tablet Take 1 tablet (8 mg total) by mouth every 8 (eight) hours as needed for nausea. 30 tablet 3  . polyvinyl alcohol (ARTIFICIAL  TEARS) 1.4 % ophthalmic solution Place 1 drop into both eyes 3 (three) times daily as needed for dry eyes (dry eyes).     . sodium chloride (OCEAN) 0.65 % SOLN nasal spray Place 1 spray into both nostrils 2 (two) times daily.     Marland Kitchen tiZANidine (ZANAFLEX) 4 MG tablet Take 4 mg by mouth 3 (three) times a week.    . zolpidem (AMBIEN) 5 MG tablet Take 5 mg by mouth at bedtime as needed for sleep (sleep).      No current facility-administered medications for this visit.    PHYSICAL EXAMINATION: ECOG PERFORMANCE STATUS: 0 - Asymptomatic  Filed Vitals:   05/18/15 1043  BP: 151/43  Pulse: 62  Temp: 97.4 F (36.3 C)  Resp: 18   Filed Weights   05/18/15 1043  Weight: 117 lb 1.6 oz (53.116 kg)    GENERAL:alert, no distress and comfortable SKIN: noted skin bruises. No petechiae.  EYES: normal, Conjunctiva are pink and non-injected, sclera clear OROPHARYNX:no exudate, no erythema and lips, buccal mucosa, and tongue normal  NECK: supple, thyroid normal size, non-tender, without nodularity LYMPH:  no palpable lymphadenopathy in the cervical, axillary or inguinal LUNGS: clear to auscultation and percussion with normal breathing effort HEART: regular rate & rhythm and no murmurs and no lower extremity edema ABDOMEN:abdomen soft, non-tender and normal bowel sounds Musculoskeletal:no cyanosis of digits and no clubbing  NEURO: alert & oriented x 3 with fluent speech, no focal motor/sensory deficits  LABORATORY DATA:  I have reviewed the data as listed    Component Value Date/Time   NA 141 04/23/2015 0921   NA 141 12/22/2013 0530   K 4.8 04/23/2015 0921   K 4.0 12/22/2013 0530   CL 107 12/22/2013 0530   CL 99 04/08/2013 0922   CO2 29 04/23/2015 0921   CO2 22 12/22/2013 0530   GLUCOSE 100 04/23/2015 0921   GLUCOSE 115* 12/22/2013 0530   GLUCOSE 92 04/08/2013 0922   BUN 14.5 04/23/2015 0921   BUN 26* 12/22/2013 0530   CREATININE 1.0 04/23/2015 0921   CREATININE 0.87 12/22/2013 0530    CALCIUM 10.6* 04/23/2015 0921   CALCIUM 8.9 12/22/2013 0530   PROT 6.3* 04/23/2015 0921   PROT 5.1* 12/22/2013 0530   ALBUMIN 4.7 04/23/2015 0921   ALBUMIN 3.3* 12/22/2013 0530   AST 29 04/23/2015 0921   AST 49* 12/22/2013 0530   ALT 23 04/23/2015 0921   ALT 105* 12/22/2013 0530   ALKPHOS 47 04/23/2015 0921   ALKPHOS 49 12/22/2013 0530   BILITOT 1.34* 04/23/2015 0921   BILITOT 0.7 12/22/2013 0530   GFRNONAA 62* 12/22/2013 0530   GFRAA 72* 12/22/2013 0530    No results found for: SPEP, UPEP  Lab Results  Component Value Date   WBC 38.7* 05/18/2015   NEUTROABS 4.1 05/18/2015   HGB 12.6 05/18/2015   HCT 38.9 05/18/2015   MCV 91.7 05/18/2015   PLT 83* 05/18/2015      Chemistry      Component Value Date/Time   NA 141 04/23/2015 0921   NA 141 12/22/2013 0530   K 4.8 04/23/2015 0921   K 4.0 12/22/2013 0530  CL 107 12/22/2013 0530   CL 99 04/08/2013 0922   CO2 29 04/23/2015 0921   CO2 22 12/22/2013 0530   BUN 14.5 04/23/2015 0921   BUN 26* 12/22/2013 0530   CREATININE 1.0 04/23/2015 0921   CREATININE 0.87 12/22/2013 0530      Component Value Date/Time   CALCIUM 10.6* 04/23/2015 0921   CALCIUM 8.9 12/22/2013 0530   ALKPHOS 47 04/23/2015 0921   ALKPHOS 49 12/22/2013 0530   AST 29 04/23/2015 0921   AST 49* 12/22/2013 0530   ALT 23 04/23/2015 0921   ALT 105* 12/22/2013 0530   BILITOT 1.34* 04/23/2015 0921   BILITOT 0.7 12/22/2013 0530      ASSESSMENT & PLAN:  CLL (chronic lymphocytic leukemia) She tolerated treatment well. She has transient lymphocytosis which is not an uncommon finding with the chemotherapy. I recommend she continue at same dose without dose adjustment. Neutropenia has resolved. Thrombocytopenia is improving. Plan to see her back within a month for further follow-up  Thrombocytopenia The cause is related to splenomegaly and her underlying disease. The patient denies recent history of bleeding such as epistaxis, hematuria or hematochezia. She  is asymptomatic from the thrombocytopenia. I will observe for now.     Rhinitis, chronic This is chronic in nature likely related to allergies. Clinically, she does not appear to have infection.   Orders Placed This Encounter  Procedures  . CBC with Differential/Platelet    Standing Status: Future     Number of Occurrences:      Standing Expiration Date: 06/21/2016  . Comprehensive metabolic panel    Standing Status: Future     Number of Occurrences:      Standing Expiration Date: 06/21/2016  . Lactate dehydrogenase    Standing Status: Future     Number of Occurrences:      Standing Expiration Date: 06/21/2016   All questions were answered. The patient knows to call the clinic with any problems, questions or concerns. No barriers to learning was detected. I spent 15 minutes counseling the patient face to face. The total time spent in the appointment was 20 minutes and more than 50% was on counseling and review of test results     [facility], [place], MD 05/18/2015 11:06 AM

## 2015-05-23 ENCOUNTER — Telehealth: Payer: Self-pay | Admitting: *Deleted

## 2015-05-23 ENCOUNTER — Other Ambulatory Visit: Payer: Self-pay | Admitting: Hematology and Oncology

## 2015-05-23 ENCOUNTER — Telehealth: Payer: Self-pay | Admitting: Hematology and Oncology

## 2015-05-23 NOTE — Telephone Encounter (Signed)
Bruising is a common side -effect Unless she has bleeding, she can continue taking ibrutinib I recommend she takes pictures If size of bruise gets worse she stops When will she return? I need to see her back before next month She should not get massage until I see her Continue OTC allergy medications

## 2015-05-23 NOTE — Telephone Encounter (Signed)
Pt called stated she has been taking Ibrutinib for at least ten days when pt saw Dr. Alvy Bimler on 7/15.   Pt was out of state on the weekend, and noticed of more bruising on  Left lower leg, bruising on Left foot on inside of heel -  About 3 inches in size.  Pt also noted more swelling in her feet - pt tries to elevated feet while riding in the car.   Pt has several questions for Dr. Alvy Bimler and would like to know md's suggestions re: 1.   Leg bruising and swelling  -  What can pt do to help ? 2.   More hoarseness today -  Denied  Fever, has post nasal drainage ( coughing up clear sputum ). 3.   Has massage every 4 weeks for Fibromyalgia -  Is it ok for pt to have massage while on Ibrutinib ?  If yes,  Pt will need a note       Sent to  [facility] stating it is ok for pt to have massage. Pt's  Phone    [phone].

## 2015-05-23 NOTE — Telephone Encounter (Signed)
Pt has appts on 06/18/15 with Dr. Alvy Bimler.

## 2015-05-23 NOTE — Telephone Encounter (Signed)
S/w pt confirming labs/ov per 07/20 POF..... [facility]

## 2015-05-23 NOTE — Telephone Encounter (Signed)
Spoke with Angelica Sullivan and informed Angelica Sullivan of Dr. Calton Dach instructions below.  Informed Angelica Sullivan that a scheduler will call Angelica Sullivan with an appt sooner than 8/15.  Angelica Sullivan voiced understanding.

## 2015-05-25 ENCOUNTER — Encounter: Payer: Self-pay | Admitting: Hematology and Oncology

## 2015-05-25 NOTE — Progress Notes (Signed)
Pt is approved with [employer] for [employer] from 04/25/15 to 04/23/16 or when the benefit cap has been met.  The amount of the grant is $11,000.

## 2015-05-29 ENCOUNTER — Ambulatory Visit (HOSPITAL_BASED_OUTPATIENT_CLINIC_OR_DEPARTMENT_OTHER): Payer: Medicare Other | Admitting: Hematology and Oncology

## 2015-05-29 ENCOUNTER — Other Ambulatory Visit (HOSPITAL_BASED_OUTPATIENT_CLINIC_OR_DEPARTMENT_OTHER): Payer: Medicare Other

## 2015-05-29 VITALS — BP 133/44 | HR 68 | Temp 99.3°F | Resp 18 | Ht 60.0 in | Wt 112.4 lb

## 2015-05-29 DIAGNOSIS — D696 Thrombocytopenia, unspecified: Secondary | ICD-10-CM

## 2015-05-29 DIAGNOSIS — R238 Other skin changes: Secondary | ICD-10-CM

## 2015-05-29 DIAGNOSIS — C9110 Chronic lymphocytic leukemia of B-cell type not having achieved remission: Secondary | ICD-10-CM

## 2015-05-29 DIAGNOSIS — R233 Spontaneous ecchymoses: Secondary | ICD-10-CM

## 2015-05-29 LAB — COMPREHENSIVE METABOLIC PANEL (CC13)
ALBUMIN: 4.2 g/dL (ref 3.5–5.0)
ALT: 19 U/L (ref 0–55)
AST: 23 U/L (ref 5–34)
Alkaline Phosphatase: 42 U/L (ref 40–150)
Anion Gap: 8 mEq/L (ref 3–11)
BUN: 15.4 mg/dL (ref 7.0–26.0)
CHLORIDE: 102 meq/L (ref 98–109)
CO2: 27 mEq/L (ref 22–29)
Calcium: 10 mg/dL (ref 8.4–10.4)
Creatinine: 0.8 mg/dL (ref 0.6–1.1)
EGFR: 68 mL/min/{1.73_m2} — ABNORMAL LOW (ref >90)
Glucose: 86 mg/dl (ref 70–140)
Potassium: 5.1 mEq/L (ref 3.5–5.1)
Sodium: 138 mEq/L (ref 136–145)
Total Bilirubin: 0.74 mg/dL (ref 0.20–1.20)
Total Protein: 5.9 g/dL — ABNORMAL LOW (ref 6.4–8.3)

## 2015-05-29 LAB — CBC WITH DIFFERENTIAL/PLATELET
BASO%: 0.3 % (ref 0.0–2.0)
BASOS ABS: 0.2 10*3/uL — AB (ref 0.0–0.1)
EOS%: 0.3 % (ref 0.0–7.0)
Eosinophils Absolute: 0.2 10*3/uL (ref 0.0–0.5)
HCT: 38.9 % (ref 34.8–46.6)
HGB: 12.6 g/dL (ref 11.6–15.9)
LYMPH#: 42 10*3/uL — AB (ref 0.9–3.3)
LYMPH%: 82.2 % — ABNORMAL HIGH (ref 14.0–49.7)
MCH: 29.9 pg (ref 25.1–34.0)
MCHC: 32.3 g/dL (ref 31.5–36.0)
MCV: 92.3 fL (ref 79.5–101.0)
MONO#: 1.5 10*3/uL — AB (ref 0.1–0.9)
MONO%: 3 % (ref 0.0–14.0)
NEUT%: 14.2 % — ABNORMAL LOW (ref 38.4–76.8)
NEUTROS ABS: 7.2 10*3/uL — AB (ref 1.5–6.5)
PLATELETS: 124 10*3/uL — AB (ref 145–400)
RBC: 4.22 10*6/uL (ref 3.70–5.45)
RDW: 14.7 % — AB (ref 11.2–14.5)
WBC: 51.1 10*3/uL (ref 3.9–10.3)

## 2015-05-29 LAB — LACTATE DEHYDROGENASE (CC13): LDH: 180 U/L (ref 125–245)

## 2015-05-30 ENCOUNTER — Encounter: Payer: Self-pay | Admitting: Hematology and Oncology

## 2015-05-30 DIAGNOSIS — R238 Other skin changes: Secondary | ICD-10-CM | POA: Insufficient documentation

## 2015-05-30 DIAGNOSIS — R233 Spontaneous ecchymoses: Secondary | ICD-10-CM | POA: Insufficient documentation

## 2015-05-30 NOTE — Assessment & Plan Note (Signed)
The cause is related to splenomegaly and her underlying disease. The patient denies recent history of bleeding such as epistaxis, hematuria or hematochezia. She is asymptomatic from the thrombocytopenia. I will observe for now.

## 2015-05-30 NOTE — Assessment & Plan Note (Signed)
This is related to Ibrutinib and recent excessive activity. I recommend avoid massage therapy and strenuous activities.

## 2015-05-30 NOTE — Assessment & Plan Note (Signed)
She tolerated treatment well. She has transient lymphocytosis which is not an uncommon finding with the chemotherapy. I recommend she continue at same dose without dose adjustment. Neutropenia has resolved. Thrombocytopenia is improving. Plan to see her back within a month for further follow-up

## 2015-05-30 NOTE — Progress Notes (Signed)
[facility] OFFICE PROGRESS NOTE  Patient Care Team: Shon Baton, MD as PCP - General (Internal Medicine) Heath Lark, MD as Consulting Physician (Hematology and Oncology)  SUMMARY OF ONCOLOGIC HISTORY: Oncology History   CLL stage IV, deletion 13q     CLL (chronic lymphocytic leukemia)   12/12/2013 Bone Marrow Biopsy Bone marrow biopsy show significant involvement of CLL   12/13/2013 Procedure Patient had placement of a port   12/14/2013 Imaging CT scan of the chest, abdomen and pelvis showed significant lymphadenopathy and splenomegaly   12/20/2013 - 12/22/2013 Hospital Admission The patient was admitted to the hospital to begin cycle 1 day 1 of chemotherapy due to risk of infusion reaction. She tolerated treatment well without major side effects.   12/20/2013 - 03/13/2014 Chemotherapy started on cycle 1 of Obinutuzumab, chlorambucil and prednisone. All treatment placed on hold on 03/13/2014 due to persistent pancytopenia.   03/28/2014 Imaging Repeat imaging study showed excellent response to treatment.   10/25/2014 Imaging She has persistent mild lymphadenopathy and splenomegaly, improved compared to previous exam   04/23/2015 Imaging Ct scan showed disease progression   05/09/2015 -  Chemotherapy She started taking ibrutinib.    INTERVAL HISTORY: Please see below for problem oriented charting. She is seen urgently because of new, excessive bruising at the bottom of her right foot. She traveled to the beach recently and was in a prolonged car ride and out in the sun. She walked around a lot and had excessive leg swelling along with new onset of skin bruising at the bottom of her right foot. The patient denies any recent signs or symptoms of bleeding such as spontaneous epistaxis, hematuria or hematochezia.   REVIEW OF SYSTEMS:   Constitutional: Denies fevers, chills or abnormal weight loss Eyes: Denies blurriness of vision Ears, nose, mouth, throat, and face: Denies mucositis or  sore throat Respiratory: Denies cough, dyspnea or wheezes Cardiovascular: Denies palpitation, chest discomfort  Gastrointestinal:  Denies nausea, heartburn or change in bowel habits Skin: Denies abnormal skin rashes Lymphatics: Denies new lymphadenopathy  Neurological:Denies numbness, tingling or new weaknesses Behavioral/Psych: Mood is stable, no new changes  All other systems were reviewed with the patient and are negative.  I have reviewed the past medical history, past surgical history, social history and family history with the patient and they are unchanged from previous note.  ALLERGIES:  is allergic to bactrim.  MEDICATIONS:  Current Outpatient Prescriptions  Medication Sig Dispense Refill  . acyclovir (ZOVIRAX) 400 MG tablet Take 1 tablet (400 mg total) by mouth daily. 90 tablet 3  . atorvastatin (LIPITOR) 20 MG tablet Take 20 mg by mouth every evening.     . calcium citrate-vitamin D (CITRACAL+D) 315-200 MG-UNIT per tablet Take 1 tablet by mouth daily.     . Cholecalciferol (VITAMIN D) 400 UNITS capsule Take 1,000 Units by mouth at bedtime.     . diphenhydrAMINE (BENADRYL) 25 mg capsule Take 25 mg by mouth at bedtime as needed for allergies (allergies).     . Echinacea 400 MG CAPS Take 1 capsule by mouth 2 (two) times daily.      Marland Kitchen ESTRACE VAGINAL 0.1 MG/GM vaginal cream Place 1 Applicatorful vaginally 2 (two) times a week.     Marland Kitchen glucosamine-chondroitin 500-400 MG tablet Take 1 tablet by mouth daily.      Marland Kitchen ibrutinib (IMBRUVICA) 140 MG capsul Take 3 capsules (420 mg total) by mouth daily. 90 capsule 6  . lidocaine-prilocaine (EMLA) cream Apply 1 application topically as  needed (port access.).    Marland Kitchen loratadine (CLARITIN) 10 MG tablet Take 10 mg by mouth daily.      . montelukast (SINGULAIR) 10 MG tablet Take 10 mg by mouth daily.     . Multiple Vitamin (MULTIVITAMIN) tablet Take 1 tablet by mouth daily.      . ondansetron (ZOFRAN) 8 MG tablet Take 1 tablet (8 mg total) by mouth  every 8 (eight) hours as needed for nausea. 30 tablet 3  . polyvinyl alcohol (ARTIFICIAL TEARS) 1.4 % ophthalmic solution Place 1 drop into both eyes 3 (three) times daily as needed for dry eyes (dry eyes).     . sodium chloride (OCEAN) 0.65 % SOLN nasal spray Place 1 spray into both nostrils 2 (two) times daily.     Marland Kitchen tiZANidine (ZANAFLEX) 4 MG tablet Take 4 mg by mouth 3 (three) times a week.    . zolpidem (AMBIEN) 5 MG tablet Take 5 mg by mouth at bedtime as needed for sleep (sleep).      No current facility-administered medications for this visit.    PHYSICAL EXAMINATION: ECOG PERFORMANCE STATUS: 1 - Symptomatic but completely ambulatory  Filed Vitals:   05/29/15 1429  BP: 133/44  Pulse: 68  Temp: 99.3 F (37.4 C)  Resp: 18   Filed Weights   05/29/15 1429  Weight: 112 lb 6.4 oz (50.984 kg)    GENERAL:alert, no distress and comfortable SKIN: noted skin bruises. No petechiae EYES: normal, Conjunctiva are pink and non-injected, sclera clear OROPHARYNX:no exudate, no erythema and lips, buccal mucosa, and tongue normal  NECK: supple, thyroid normal size, non-tender, without nodularity LYMPH:  no palpable lymphadenopathy in the cervical, axillary or inguinal LUNGS: clear to auscultation and percussion with normal breathing effort HEART: regular rate & rhythm and no murmurs and no lower extremity edema ABDOMEN:abdomen soft, non-tender and normal bowel sounds Musculoskeletal:no cyanosis of digits and no clubbing  NEURO: alert & oriented x 3 with fluent speech, no focal motor/sensory deficits  LABORATORY DATA:  I have reviewed the data as listed    Component Value Date/Time   NA 138 05/29/2015 1408   NA 141 12/22/2013 0530   K 5.1 05/29/2015 1408   K 4.0 12/22/2013 0530   CL 107 12/22/2013 0530   CL 99 04/08/2013 0922   CO2 27 05/29/2015 1408   CO2 22 12/22/2013 0530   GLUCOSE 86 05/29/2015 1408   GLUCOSE 115* 12/22/2013 0530   GLUCOSE 92 04/08/2013 0922   BUN 15.4  05/29/2015 1408   BUN 26* 12/22/2013 0530   CREATININE 0.8 05/29/2015 1408   CREATININE 0.87 12/22/2013 0530   CALCIUM 10.0 05/29/2015 1408   CALCIUM 8.9 12/22/2013 0530   PROT 5.9* 05/29/2015 1408   PROT 5.1* 12/22/2013 0530   ALBUMIN 4.2 05/29/2015 1408   ALBUMIN 3.3* 12/22/2013 0530   AST 23 05/29/2015 1408   AST 49* 12/22/2013 0530   ALT 19 05/29/2015 1408   ALT 105* 12/22/2013 0530   ALKPHOS 42 05/29/2015 1408   ALKPHOS 49 12/22/2013 0530   BILITOT 0.74 05/29/2015 1408   BILITOT 0.7 12/22/2013 0530   GFRNONAA 62* 12/22/2013 0530   GFRAA 72* 12/22/2013 0530    No results found for: SPEP, UPEP  Lab Results  Component Value Date   WBC 51.1* 05/29/2015   NEUTROABS 7.2* 05/29/2015   HGB 12.6 05/29/2015   HCT 38.9 05/29/2015   MCV 92.3 05/29/2015   PLT 124* 05/29/2015      Chemistry  Component Value Date/Time   NA 138 05/29/2015 1408   NA 141 12/22/2013 0530   K 5.1 05/29/2015 1408   K 4.0 12/22/2013 0530   CL 107 12/22/2013 0530   CL 99 04/08/2013 0922   CO2 27 05/29/2015 1408   CO2 22 12/22/2013 0530   BUN 15.4 05/29/2015 1408   BUN 26* 12/22/2013 0530   CREATININE 0.8 05/29/2015 1408   CREATININE 0.87 12/22/2013 0530      Component Value Date/Time   CALCIUM 10.0 05/29/2015 1408   CALCIUM 8.9 12/22/2013 0530   ALKPHOS 42 05/29/2015 1408   ALKPHOS 49 12/22/2013 0530   AST 23 05/29/2015 1408   AST 49* 12/22/2013 0530   ALT 19 05/29/2015 1408   ALT 105* 12/22/2013 0530   BILITOT 0.74 05/29/2015 1408   BILITOT 0.7 12/22/2013 0530     ASSESSMENT & PLAN:  CLL (chronic lymphocytic leukemia) She tolerated treatment well. She has transient lymphocytosis which is not an uncommon finding with the chemotherapy. I recommend she continue at same dose without dose adjustment. Neutropenia has resolved. Thrombocytopenia is improving. Plan to see her back within a month for further follow-up  Thrombocytopenia The cause is related to splenomegaly and her  underlying disease. The patient denies recent history of bleeding such as epistaxis, hematuria or hematochezia. She is asymptomatic from the thrombocytopenia. I will observe for now.     Easy bruising This is related to Ibrutinib and recent excessive activity. I recommend avoid massage therapy and strenuous activities.   No orders of the defined types were placed in this encounter.   All questions were answered. The patient knows to call the clinic with any problems, questions or concerns. No barriers to learning was detected. I spent 15 minutes counseling the patient face to face. The total time spent in the appointment was 20 minutes and more than 50% was on counseling and review of test results     [facility], [place], MD 05/30/2015 8:28 PM

## 2015-06-18 ENCOUNTER — Ambulatory Visit (HOSPITAL_BASED_OUTPATIENT_CLINIC_OR_DEPARTMENT_OTHER): Payer: Medicare Other | Admitting: Hematology and Oncology

## 2015-06-18 ENCOUNTER — Other Ambulatory Visit (HOSPITAL_BASED_OUTPATIENT_CLINIC_OR_DEPARTMENT_OTHER): Payer: Medicare Other

## 2015-06-18 ENCOUNTER — Encounter: Payer: Self-pay | Admitting: Hematology and Oncology

## 2015-06-18 ENCOUNTER — Telehealth: Payer: Self-pay | Admitting: Hematology and Oncology

## 2015-06-18 ENCOUNTER — Other Ambulatory Visit: Payer: Self-pay | Admitting: Hematology and Oncology

## 2015-06-18 VITALS — BP 139/47 | HR 66 | Temp 97.7°F | Resp 18 | Ht 60.0 in | Wt 114.4 lb

## 2015-06-18 DIAGNOSIS — C9110 Chronic lymphocytic leukemia of B-cell type not having achieved remission: Secondary | ICD-10-CM

## 2015-06-18 DIAGNOSIS — R238 Other skin changes: Secondary | ICD-10-CM

## 2015-06-18 DIAGNOSIS — D696 Thrombocytopenia, unspecified: Secondary | ICD-10-CM

## 2015-06-18 DIAGNOSIS — R233 Spontaneous ecchymoses: Secondary | ICD-10-CM

## 2015-06-18 LAB — COMPREHENSIVE METABOLIC PANEL (CC13)
ALT: 17 U/L (ref 0–55)
AST: 18 U/L (ref 5–34)
Albumin: 3.9 g/dL (ref 3.5–5.0)
Alkaline Phosphatase: 36 U/L — ABNORMAL LOW (ref 40–150)
Anion Gap: 3 mEq/L (ref 3–11)
BUN: 11.8 mg/dL (ref 7.0–26.0)
CALCIUM: 9.7 mg/dL (ref 8.4–10.4)
CHLORIDE: 105 meq/L (ref 98–109)
CO2: 26 mEq/L (ref 22–29)
CREATININE: 0.8 mg/dL (ref 0.6–1.1)
EGFR: 70 mL/min/{1.73_m2} — ABNORMAL LOW (ref >90)
Glucose: 90 mg/dl (ref 70–140)
Potassium: 5 mEq/L (ref 3.5–5.1)
Sodium: 134 mEq/L — ABNORMAL LOW (ref 136–145)
Total Bilirubin: 0.92 mg/dL (ref 0.20–1.20)
Total Protein: 5.4 g/dL — ABNORMAL LOW (ref 6.4–8.3)

## 2015-06-18 LAB — CBC WITH DIFFERENTIAL/PLATELET
BASO%: 0.5 % (ref 0.0–2.0)
Basophils Absolute: 0.2 10*3/uL — ABNORMAL HIGH (ref 0.0–0.1)
EOS%: 0.3 % (ref 0.0–7.0)
Eosinophils Absolute: 0.1 10*3/uL (ref 0.0–0.5)
HEMATOCRIT: 38.2 % (ref 34.8–46.6)
HEMOGLOBIN: 12.6 g/dL (ref 11.6–15.9)
LYMPH#: 38.5 10*3/uL — AB (ref 0.9–3.3)
LYMPH%: 90.6 % — ABNORMAL HIGH (ref 14.0–49.7)
MCH: 30.3 pg (ref 25.1–34.0)
MCHC: 32.9 g/dL (ref 31.5–36.0)
MCV: 92.2 fL (ref 79.5–101.0)
MONO#: 1.4 10*3/uL — ABNORMAL HIGH (ref 0.1–0.9)
MONO%: 3.3 % (ref 0.0–14.0)
NEUT#: 2.2 10*3/uL (ref 1.5–6.5)
NEUT%: 5.3 % — ABNORMAL LOW (ref 38.4–76.8)
Platelets: 101 10*3/uL — ABNORMAL LOW (ref 145–400)
RBC: 4.15 10*6/uL (ref 3.70–5.45)
RDW: 13.6 % (ref 11.2–14.5)
WBC: 42.5 10*3/uL — AB (ref 3.9–10.3)

## 2015-06-18 LAB — LACTATE DEHYDROGENASE (CC13): LDH: 192 U/L (ref 125–245)

## 2015-06-18 LAB — TECHNOLOGIST REVIEW

## 2015-06-18 NOTE — Telephone Encounter (Signed)
per pof to sch pt appt-gave pt copy of avs °

## 2015-06-18 NOTE — Assessment & Plan Note (Signed)
The cause is related to splenomegaly and her underlying disease. The patient denies recent history of bleeding such as epistaxis, hematuria or hematochezia. She is asymptomatic from the thrombocytopenia. I will observe for now.

## 2015-06-18 NOTE — Progress Notes (Signed)
[place] OFFICE PROGRESS NOTE  Patient Care Team: Shon Baton, MD as PCP - General (Internal Medicine) Heath Lark, MD as Consulting Physician (Hematology and Oncology)  SUMMARY OF ONCOLOGIC HISTORY: Oncology History   CLL stage IV, deletion 13q     CLL (chronic lymphocytic leukemia)   12/12/2013 Bone Marrow Biopsy Bone marrow biopsy show significant involvement of CLL   12/13/2013 Procedure Patient had placement of a port   12/14/2013 Imaging CT scan of the chest, abdomen and pelvis showed significant lymphadenopathy and splenomegaly   12/20/2013 - 12/22/2013 Hospital Admission The patient was admitted to the hospital to begin cycle 1 day 1 of chemotherapy due to risk of infusion reaction. She tolerated treatment well without major side effects.   12/20/2013 - 03/13/2014 Chemotherapy started on cycle 1 of Obinutuzumab, chlorambucil and prednisone. All treatment placed on hold on 03/13/2014 due to persistent pancytopenia.   03/28/2014 Imaging Repeat imaging study showed excellent response to treatment.   10/25/2014 Imaging She has persistent mild lymphadenopathy and splenomegaly, improved compared to previous exam   04/23/2015 Imaging Ct scan showed disease progression   05/09/2015 -  Chemotherapy She started taking ibrutinib.    INTERVAL HISTORY: Please see below for problem oriented charting.  she returns for further follow-up. She continues to bruise easily but denies any bleeding. Overall, she has no side effects of treatment. Denies diarrhea or fatigue.  REVIEW OF SYSTEMS:   Constitutional: Denies fevers, chills or abnormal weight loss Eyes: Denies blurriness of vision Ears, nose, mouth, throat, and face: Denies mucositis or sore throat Respiratory: Denies cough, dyspnea or wheezes Cardiovascular: Denies palpitation, chest discomfort or lower extremity swelling Gastrointestinal:  Denies nausea, heartburn or change in bowel habits Skin: Denies abnormal skin  rashes Lymphatics: Denies new lymphadenopathy  Neurological:Denies numbness, tingling or new weaknesses Behavioral/Psych: Mood is stable, no new changes  All other systems were reviewed with the patient and are negative.  I have reviewed the past medical history, past surgical history, social history and family history with the patient and they are unchanged from previous note.  ALLERGIES:  is allergic to bactrim.  MEDICATIONS:  Current Outpatient Prescriptions  Medication Sig Dispense Refill  . acyclovir (ZOVIRAX) 400 MG tablet Take 1 tablet (400 mg total) by mouth daily. 90 tablet 3  . atorvastatin (LIPITOR) 20 MG tablet Take 20 mg by mouth every evening.     . calcium citrate-vitamin D (CITRACAL+D) 315-200 MG-UNIT per tablet Take 1 tablet by mouth daily.     . Cholecalciferol (VITAMIN D) 400 UNITS capsule Take 1,000 Units by mouth at bedtime.     . diphenhydrAMINE (BENADRYL) 25 mg capsule Take 25 mg by mouth at bedtime as needed for allergies (allergies).     . Echinacea 400 MG CAPS Take 1 capsule by mouth 2 (two) times daily.      Marland Kitchen ESTRACE VAGINAL 0.1 MG/GM vaginal cream Place 1 Applicatorful vaginally 2 (two) times a week.     Marland Kitchen glucosamine-chondroitin 500-400 MG tablet Take 1 tablet by mouth daily.      Marland Kitchen ibrutinib (IMBRUVICA) 140 MG capsul Take 3 capsules (420 mg total) by mouth daily. 90 capsule 6  . loratadine (CLARITIN) 10 MG tablet Take 10 mg by mouth daily.      . montelukast (SINGULAIR) 10 MG tablet Take 10 mg by mouth daily.     . Multiple Vitamin (MULTIVITAMIN) tablet Take 1 tablet by mouth daily.      . ondansetron (ZOFRAN) 8 MG tablet  Take 1 tablet (8 mg total) by mouth every 8 (eight) hours as needed for nausea. 30 tablet 3  . polyvinyl alcohol (ARTIFICIAL TEARS) 1.4 % ophthalmic solution Place 1 drop into both eyes 3 (three) times daily as needed for dry eyes (dry eyes).     . sodium chloride (OCEAN) 0.65 % SOLN nasal spray Place 1 spray into both nostrils 2 (two) times  daily.     Marland Kitchen tiZANidine (ZANAFLEX) 4 MG tablet Take 4 mg by mouth 3 (three) times a week.    . zolpidem (AMBIEN) 5 MG tablet Take 5 mg by mouth at bedtime as needed for sleep (sleep).      No current facility-administered medications for this visit.    PHYSICAL EXAMINATION: ECOG PERFORMANCE STATUS: 0 - Asymptomatic  Filed Vitals:   06/18/15 1101  BP: 139/47  Pulse: 66  Temp: 97.7 F (36.5 C)  Resp: 18   Filed Weights   06/18/15 1101  Weight: 114 lb 6.4 oz (51.891 kg)    GENERAL:alert, no distress and comfortable SKIN:  Noted some mild skin rash and bruises EYES: normal, Conjunctiva are pink and non-injected, sclera clear Musculoskeletal:no cyanosis of digits and no clubbing  NEURO: alert & oriented x 3 with fluent speech, no focal motor/sensory deficits  LABORATORY DATA:  I have reviewed the data as listed    Component Value Date/Time   NA 134* 06/18/2015 1039   NA 141 12/22/2013 0530   K 5.0 06/18/2015 1039   K 4.0 12/22/2013 0530   CL 107 12/22/2013 0530   CL 99 04/08/2013 0922   CO2 26 06/18/2015 1039   CO2 22 12/22/2013 0530   GLUCOSE 90 06/18/2015 1039   GLUCOSE 115* 12/22/2013 0530   GLUCOSE 92 04/08/2013 0922   BUN 11.8 06/18/2015 1039   BUN 26* 12/22/2013 0530   CREATININE 0.8 06/18/2015 1039   CREATININE 0.87 12/22/2013 0530   CALCIUM 9.7 06/18/2015 1039   CALCIUM 8.9 12/22/2013 0530   PROT 5.4* 06/18/2015 1039   PROT 5.1* 12/22/2013 0530   ALBUMIN 3.9 06/18/2015 1039   ALBUMIN 3.3* 12/22/2013 0530   AST 18 06/18/2015 1039   AST 49* 12/22/2013 0530   ALT 17 06/18/2015 1039   ALT 105* 12/22/2013 0530   ALKPHOS 36* 06/18/2015 1039   ALKPHOS 49 12/22/2013 0530   BILITOT 0.92 06/18/2015 1039   BILITOT 0.7 12/22/2013 0530   GFRNONAA 62* 12/22/2013 0530   GFRAA 72* 12/22/2013 0530    No results found for: SPEP, UPEP  Lab Results  Component Value Date   WBC 42.5* 06/18/2015   NEUTROABS 2.2 06/18/2015   HGB 12.6 06/18/2015   HCT 38.2  06/18/2015   MCV 92.2 06/18/2015   PLT 101* 06/18/2015      Chemistry      Component Value Date/Time   NA 134* 06/18/2015 1039   NA 141 12/22/2013 0530   K 5.0 06/18/2015 1039   K 4.0 12/22/2013 0530   CL 107 12/22/2013 0530   CL 99 04/08/2013 0922   CO2 26 06/18/2015 1039   CO2 22 12/22/2013 0530   BUN 11.8 06/18/2015 1039   BUN 26* 12/22/2013 0530   CREATININE 0.8 06/18/2015 1039   CREATININE 0.87 12/22/2013 0530      Component Value Date/Time   CALCIUM 9.7 06/18/2015 1039   CALCIUM 8.9 12/22/2013 0530   ALKPHOS 36* 06/18/2015 1039   ALKPHOS 49 12/22/2013 0530   AST 18 06/18/2015 1039   AST 49* 12/22/2013 0530  ALT 17 06/18/2015 1039   ALT 105* 12/22/2013 0530   BILITOT 0.92 06/18/2015 1039   BILITOT 0.7 12/22/2013 0530      ASSESSMENT & PLAN:  CLL (chronic lymphocytic leukemia) She tolerated treatment well. She has transient lymphocytosis which is not an uncommon finding with the chemotherapy. I recommend she continue at same dose without dose adjustment. Neutropenia has resolved. Thrombocytopenia is improving. Plan to see her back within 3 months for further follow-up  Thrombocytopenia The cause is related to splenomegaly and her underlying disease. The patient denies recent history of bleeding such as epistaxis, hematuria or hematochezia. She is asymptomatic from the thrombocytopenia. I will observe for now.      Easy bruising This is related to Ibrutinib and recent excessive activity. I recommend avoid deep massage therapy and strenuous activities.   Orders Placed This Encounter  Procedures  . CBC with Differential/Platelet    Standing Status: Future     Number of Occurrences:      Standing Expiration Date: 07/22/2016  . Comprehensive metabolic panel    Standing Status: Future     Number of Occurrences:      Standing Expiration Date: 07/22/2016  . Lactate dehydrogenase    Standing Status: Future     Number of Occurrences:      Standing  Expiration Date: 07/22/2016   All questions were answered. The patient knows to call the clinic with any problems, questions or concerns. No barriers to learning was detected. I spent 15 minutes counseling the patient face to face. The total time spent in the appointment was 20 minutes and more than 50% was on counseling and review of test results     [facility], [place], MD 06/18/2015 3:17 PM

## 2015-06-18 NOTE — Assessment & Plan Note (Signed)
This is related to Ibrutinib and recent excessive activity. I recommend avoid deep massage therapy and strenuous activities.

## 2015-06-18 NOTE — Assessment & Plan Note (Signed)
She tolerated treatment well. She has transient lymphocytosis which is not an uncommon finding with the chemotherapy. I recommend she continue at same dose without dose adjustment. Neutropenia has resolved. Thrombocytopenia is improving. Plan to see her back within 3 months for further follow-up

## 2015-07-02 ENCOUNTER — Telehealth: Payer: Self-pay | Admitting: *Deleted

## 2015-07-02 NOTE — Telephone Encounter (Signed)
Voicemail from patient's spouse Marden Noble requesting a return call.  "She is having symptoms taking medicines.  Please tell the nurse and call us so we will know what to do."  Observed Ibrutinib on medication list.  Called number provided [phone].  Message left requesting return call with symptoms and instructions to report to ED for any bleeding or signs and symptoms of distress.  1518 called home number [phone].  Same message left to call with signs and symptoms or report to the ED.

## 2015-07-03 ENCOUNTER — Other Ambulatory Visit: Payer: Self-pay | Admitting: Hematology and Oncology

## 2015-07-03 ENCOUNTER — Telehealth: Payer: Self-pay | Admitting: *Deleted

## 2015-07-03 ENCOUNTER — Ambulatory Visit (HOSPITAL_COMMUNITY)
Admission: RE | Admit: 2015-07-03 | Discharge: 2015-07-03 | Disposition: A | Discharge status: Home / Self Care | Payer: Medicare Other | Source: Ambulatory Visit | Attending: Hematology and Oncology | Admitting: Hematology and Oncology

## 2015-07-03 ENCOUNTER — Encounter (HOSPITAL_COMMUNITY): Payer: Self-pay

## 2015-07-03 ENCOUNTER — Encounter: Payer: Self-pay | Admitting: Hematology and Oncology

## 2015-07-03 ENCOUNTER — Telehealth: Payer: Self-pay | Admitting: Hematology and Oncology

## 2015-07-03 DIAGNOSIS — I998 Other disorder of circulatory system: Secondary | ICD-10-CM | POA: Diagnosis not present

## 2015-07-03 DIAGNOSIS — R410 Disorientation, unspecified: Secondary | ICD-10-CM | POA: Insufficient documentation

## 2015-07-03 DIAGNOSIS — W19XXXA Unspecified fall, initial encounter: Secondary | ICD-10-CM | POA: Insufficient documentation

## 2015-07-03 HISTORY — DX: Disorientation, unspecified: R41.0

## 2015-07-03 NOTE — Telephone Encounter (Signed)
CT head approved and scheduled for this afternoon at 3 pm.   Called husband and instructed him to bring pt to [facility] at 2:45 pm for CT Scan.   Wait in lobby at [facility] after CT for results.  Explained to husband if the CT were to show any bleeding pt may need to go directly to ED, so please wait in lobby for results to be called to Dr. Alvy Bimler.   He verbalized understanding.

## 2015-07-03 NOTE — Telephone Encounter (Signed)
S/w pt and husband in lobby after her CT Scan.  Informed them CT scan normal.   Return for appt as scheduled on 9/1 for lab/EKG/MD.   They verbalized understanding.

## 2015-07-03 NOTE — Telephone Encounter (Signed)
s.w. pt husband and advised on 9.1 appt.Marland KitchenMarland KitchenMarland KitchenMarland Kitchenpt ok and aware

## 2015-07-03 NOTE — Telephone Encounter (Signed)
I managed to get hold of the patient today. She had a fall last Thursday, accidental injury to her tailbone.  After that, around Friday or Saturday, she had a spell of confusion episode which she described as psychotic, lasted 15 minutes. She could not describe further what happened.  Yesterday, she sat on the floor in her closet for unknown reason and her husband noted that she appeared confused for approximately 10 minutes. I recommend she hold her chemotherapy further until evaluation. I have ordered a CT scan of the head tomorrow to exclude injury to her brain and I plan to see her in 2 days for further evaluation

## 2015-07-04 ENCOUNTER — Other Ambulatory Visit: Payer: Self-pay | Admitting: Hematology and Oncology

## 2015-07-04 DIAGNOSIS — C9110 Chronic lymphocytic leukemia of B-cell type not having achieved remission: Secondary | ICD-10-CM

## 2015-07-05 ENCOUNTER — Ambulatory Visit: Payer: Medicare Other

## 2015-07-05 ENCOUNTER — Telehealth: Payer: Self-pay | Admitting: Hematology and Oncology

## 2015-07-05 ENCOUNTER — Encounter: Payer: Self-pay | Admitting: Hematology and Oncology

## 2015-07-05 ENCOUNTER — Ambulatory Visit (HOSPITAL_BASED_OUTPATIENT_CLINIC_OR_DEPARTMENT_OTHER): Payer: Medicare Other | Admitting: Hematology and Oncology

## 2015-07-05 ENCOUNTER — Other Ambulatory Visit (HOSPITAL_BASED_OUTPATIENT_CLINIC_OR_DEPARTMENT_OTHER): Payer: Medicare Other

## 2015-07-05 ENCOUNTER — Other Ambulatory Visit: Payer: Self-pay | Admitting: *Deleted

## 2015-07-05 VITALS — BP 146/58 | HR 69 | Temp 98.2°F | Resp 18 | Ht 60.0 in | Wt 115.0 lb

## 2015-07-05 DIAGNOSIS — D6959 Other secondary thrombocytopenia: Secondary | ICD-10-CM | POA: Diagnosis not present

## 2015-07-05 DIAGNOSIS — D701 Agranulocytosis secondary to cancer chemotherapy: Secondary | ICD-10-CM

## 2015-07-05 DIAGNOSIS — E162 Hypoglycemia, unspecified: Secondary | ICD-10-CM | POA: Diagnosis not present

## 2015-07-05 DIAGNOSIS — G459 Transient cerebral ischemic attack, unspecified: Secondary | ICD-10-CM

## 2015-07-05 DIAGNOSIS — C9110 Chronic lymphocytic leukemia of B-cell type not having achieved remission: Secondary | ICD-10-CM | POA: Diagnosis not present

## 2015-07-05 HISTORY — DX: Transient cerebral ischemic attack, unspecified: G45.9

## 2015-07-05 LAB — CBC WITH DIFFERENTIAL/PLATELET
BASO%: 0.4 % (ref 0.0–2.0)
Basophils Absolute: 0.1 10*3/uL (ref 0.0–0.1)
EOS ABS: 0.1 10*3/uL (ref 0.0–0.5)
EOS%: 0.6 % (ref 0.0–7.0)
HCT: 39.5 % (ref 34.8–46.6)
HGB: 12.8 g/dL (ref 11.6–15.9)
LYMPH%: 87.7 % — AB (ref 14.0–49.7)
MCH: 29.6 pg (ref 25.1–34.0)
MCHC: 32.5 g/dL (ref 31.5–36.0)
MCV: 91 fL (ref 79.5–101.0)
MONO#: 1.6 10*3/uL — ABNORMAL HIGH (ref 0.1–0.9)
MONO%: 7 % (ref 0.0–14.0)
NEUT%: 4.3 % — ABNORMAL LOW (ref 38.4–76.8)
NEUTROS ABS: 1 10*3/uL — AB (ref 1.5–6.5)
Platelets: 97 10*3/uL — ABNORMAL LOW (ref 145–400)
RBC: 4.34 10*6/uL (ref 3.70–5.45)
RDW: 12.7 % (ref 11.2–14.5)
WBC: 23.7 10*3/uL — AB (ref 3.9–10.3)
lymph#: 20.8 10*3/uL — ABNORMAL HIGH (ref 0.9–3.3)

## 2015-07-05 LAB — COMPREHENSIVE METABOLIC PANEL (CC13)
ALBUMIN: 3.8 g/dL (ref 3.5–5.0)
ALT: 16 U/L (ref 0–55)
ANION GAP: 7 meq/L (ref 3–11)
AST: 20 U/L (ref 5–34)
Alkaline Phosphatase: 48 U/L (ref 40–150)
BILIRUBIN TOTAL: 0.63 mg/dL (ref 0.20–1.20)
BUN: 12 mg/dL (ref 7.0–26.0)
CALCIUM: 9.6 mg/dL (ref 8.4–10.4)
CHLORIDE: 103 meq/L (ref 98–109)
CO2: 26 mEq/L (ref 22–29)
CREATININE: 0.7 mg/dL (ref 0.6–1.1)
EGFR: 78 mL/min/{1.73_m2} — ABNORMAL LOW (ref >90)
Glucose: 57 mg/dl — ABNORMAL LOW (ref 70–140)
Potassium: 4.3 mEq/L (ref 3.5–5.1)
Sodium: 136 mEq/L (ref 136–145)
TOTAL PROTEIN: 5.5 g/dL — AB (ref 6.4–8.3)

## 2015-07-05 LAB — TECHNOLOGIST REVIEW

## 2015-07-05 MED ORDER — IBRUTINIB 140 MG PO CAPS: 280.0000 mg | ORAL_CAPSULE | Freq: Every day | ORAL | Status: DC

## 2015-07-05 NOTE — Assessment & Plan Note (Signed)
This is related to side effects of treatment. I recommend reducing the dose to 2 pills per day

## 2015-07-05 NOTE — Telephone Encounter (Signed)
Per Danae Chen at Dr. Carles Collet office Dr. Carles Collet will review rocords and they will contact pt with appt

## 2015-07-05 NOTE — Progress Notes (Signed)
[place] OFFICE PROGRESS NOTE  Patient Care Team: Shon Baton, MD as PCP - General (Internal Medicine) Heath Lark, MD as Consulting Physician (Hematology and Oncology)  SUMMARY OF ONCOLOGIC HISTORY: Oncology History   CLL stage IV, deletion 13q     CLL (chronic lymphocytic leukemia)   12/12/2013 Bone Marrow Biopsy Bone marrow biopsy show significant involvement of CLL   12/13/2013 Procedure Patient had placement of a port   12/14/2013 Imaging CT scan of the chest, abdomen and pelvis showed significant lymphadenopathy and splenomegaly   12/20/2013 - 12/22/2013 Hospital Admission The patient was admitted to the hospital to begin cycle 1 day 1 of chemotherapy due to risk of infusion reaction. She tolerated treatment well without major side effects.   12/20/2013 - 03/13/2014 Chemotherapy started on cycle 1 of Obinutuzumab, chlorambucil and prednisone. All treatment placed on hold on 03/13/2014 due to persistent pancytopenia.   03/28/2014 Imaging Repeat imaging study showed excellent response to treatment.   10/25/2014 Imaging She has persistent mild lymphadenopathy and splenomegaly, improved compared to previous exam   04/23/2015 Imaging Ct scan showed disease progression   05/09/2015 -  Chemotherapy She started taking ibrutinib.    INTERVAL HISTORY: Please see below for problem oriented charting. She returns to be seen urgently because of numerous spells over the past week. She had a fall last Thursday, accidental injury to her tailbone.  After that, around Friday or Saturday, she had a spell of confusion episode which she described as psychotic, lasted 15 minutes. She could not describe further what happened. According to her husband, she was in the shower and was answering his questions inappropriately. On Monday, she sat on the floor in her closet for unknown reason and her husband noted that she appeared confused for approximately 10 minutes. I recommend she hold her chemotherapy  further until evaluation. CT scan of the head on 07/02/2015 show no evidence of acute infarct She denies further spells. She denies permanent neurological deficit. She denies associated diaphoresis, chest pain or shortness of breath  REVIEW OF SYSTEMS:   Constitutional: Denies fevers, chills or abnormal weight loss Eyes: Denies blurriness of vision Ears, nose, mouth, throat, and face: Denies mucositis or sore throat Respiratory: Denies cough, dyspnea or wheezes Cardiovascular: Denies palpitation, chest discomfort or lower extremity swelling Gastrointestinal:  Denies nausea, heartburn or change in bowel habits Skin: Denies abnormal skin rashes Lymphatics: Denies new lymphadenopathy  Neurological:Denies numbness, tingling or new weaknesses Behavioral/Psych: Mood is stable, no new changes  All other systems were reviewed with the patient and are negative.  I have reviewed the past medical history, past surgical history, social history and family history with the patient and they are unchanged from previous note.  ALLERGIES:  is allergic to bactrim.  MEDICATIONS:  Current Outpatient Prescriptions  Medication Sig Dispense Refill  . acyclovir (ZOVIRAX) 400 MG tablet Take 1 tablet (400 mg total) by mouth daily. 90 tablet 3  . atorvastatin (LIPITOR) 20 MG tablet Take 20 mg by mouth every evening.     . calcium citrate-vitamin D (CITRACAL+D) 315-200 MG-UNIT per tablet Take 1 tablet by mouth daily.     . Cholecalciferol (VITAMIN D) 400 UNITS capsule Take 1,000 Units by mouth at bedtime.     . diphenhydrAMINE (BENADRYL) 25 mg capsule Take 25 mg by mouth at bedtime as needed for allergies (allergies).     . Echinacea 400 MG CAPS Take 1 capsule by mouth 2 (two) times daily.      Marland Kitchen ESTRACE  VAGINAL 0.1 MG/GM vaginal cream Place 1 Applicatorful vaginally 2 (two) times a week.     Marland Kitchen glucosamine-chondroitin 500-400 MG tablet Take 1 tablet by mouth daily.      Marland Kitchen ibrutinib (IMBRUVICA) 140 MG capsul  Take 2 capsules (280 mg total) by mouth daily. 60 capsule 6  . loratadine (CLARITIN) 10 MG tablet Take 10 mg by mouth daily.      . montelukast (SINGULAIR) 10 MG tablet Take 10 mg by mouth daily.     . Multiple Vitamin (MULTIVITAMIN) tablet Take 1 tablet by mouth daily.      . ondansetron (ZOFRAN) 8 MG tablet Take 1 tablet (8 mg total) by mouth every 8 (eight) hours as needed for nausea. 30 tablet 3  . polyvinyl alcohol (ARTIFICIAL TEARS) 1.4 % ophthalmic solution Place 1 drop into both eyes 3 (three) times daily as needed for dry eyes (dry eyes).     . sodium chloride (OCEAN) 0.65 % SOLN nasal spray Place 1 spray into both nostrils 2 (two) times daily.     Marland Kitchen tiZANidine (ZANAFLEX) 4 MG tablet Take 4 mg by mouth 3 (three) times a week.    . zolpidem (AMBIEN) 5 MG tablet Take 5 mg by mouth at bedtime as needed for sleep (sleep).      No current facility-administered medications for this visit.    PHYSICAL EXAMINATION: ECOG PERFORMANCE STATUS: 1 - Symptomatic but completely ambulatory  Filed Vitals:   07/05/15 0949  BP: 146/58  Pulse: 69  Temp: 98.2 F (36.8 C)  Resp: 18   Filed Weights   07/05/15 0949  Weight: 115 lb (52.164 kg)    GENERAL:alert, no distress and comfortable SKIN: skin color, texture, turgor are normal, no rashes or significant lesions. Extensive bruises are noted EYES: normal, Conjunctiva are pink and non-injected, sclera clear OROPHARYNX:no exudate, no erythema and lips, buccal mucosa, and tongue normal  NECK: supple, thyroid normal size, non-tender, without nodularity LYMPH:  no palpable lymphadenopathy in the cervical, axillary or inguinal LUNGS: clear to auscultation and percussion with normal breathing effort HEART: regular rate & rhythm and no murmurs and no lower extremity edema ABDOMEN:abdomen soft, non-tender and normal bowel sounds Musculoskeletal:no cyanosis of digits and no clubbing  NEURO: alert & oriented x 3 with fluent speech, no focal  motor/sensory deficits  LABORATORY DATA:  I have reviewed the data as listed    Component Value Date/Time   NA 136 07/05/2015 0840   NA 141 12/22/2013 0530   K 4.3 07/05/2015 0840   K 4.0 12/22/2013 0530   CL 107 12/22/2013 0530   CL 99 04/08/2013 0922   CO2 26 07/05/2015 0840   CO2 22 12/22/2013 0530   GLUCOSE 57* 07/05/2015 0840   GLUCOSE 115* 12/22/2013 0530   GLUCOSE 92 04/08/2013 0922   BUN 12.0 07/05/2015 0840   BUN 26* 12/22/2013 0530   CREATININE 0.7 07/05/2015 0840   CREATININE 0.87 12/22/2013 0530   CALCIUM 9.6 07/05/2015 0840   CALCIUM 8.9 12/22/2013 0530   PROT 5.5* 07/05/2015 0840   PROT 5.1* 12/22/2013 0530   ALBUMIN 3.8 07/05/2015 0840   ALBUMIN 3.3* 12/22/2013 0530   AST 20 07/05/2015 0840   AST 49* 12/22/2013 0530   ALT 16 07/05/2015 0840   ALT 105* 12/22/2013 0530   ALKPHOS 48 07/05/2015 0840   ALKPHOS 49 12/22/2013 0530   BILITOT 0.63 07/05/2015 0840   BILITOT 0.7 12/22/2013 0530   GFRNONAA 62* 12/22/2013 0530   GFRAA 72* 12/22/2013 0530  No results found for: SPEP, UPEP  Lab Results  Component Value Date   WBC 23.7* 07/05/2015   NEUTROABS 1.0* 07/05/2015   HGB 12.8 07/05/2015   HCT 39.5 07/05/2015   MCV 91.0 07/05/2015   PLT 97* 07/05/2015      Chemistry      Component Value Date/Time   NA 136 07/05/2015 0840   NA 141 12/22/2013 0530   K 4.3 07/05/2015 0840   K 4.0 12/22/2013 0530   CL 107 12/22/2013 0530   CL 99 04/08/2013 0922   CO2 26 07/05/2015 0840   CO2 22 12/22/2013 0530   BUN 12.0 07/05/2015 0840   BUN 26* 12/22/2013 0530   CREATININE 0.7 07/05/2015 0840   CREATININE 0.87 12/22/2013 0530      Component Value Date/Time   CALCIUM 9.6 07/05/2015 0840   CALCIUM 8.9 12/22/2013 0530   ALKPHOS 48 07/05/2015 0840   ALKPHOS 49 12/22/2013 0530   AST 20 07/05/2015 0840   AST 49* 12/22/2013 0530   ALT 16 07/05/2015 0840   ALT 105* 12/22/2013 0530   BILITOT 0.63 07/05/2015 0840   BILITOT 0.7 12/22/2013 0530     EKG my  office showed no evidence of atrial fibrillation  RADIOGRAPHIC STUDIES: I have personally reviewed the radiological images as listed and agreed with the findings in the report. Ct Head Wo Contrast  07/03/2015   CLINICAL DATA:  Pain and confusion following recent fall. History of chronic lymphocytic leukemia  EXAM: CT HEAD WITHOUT CONTRAST  TECHNIQUE: Contiguous axial images were obtained from the base of the skull through the vertex without intravenous contrast.  COMPARISON:  October 10, 2009  FINDINGS: There is stable mild diffuse atrophy. There is no intracranial mass, hemorrhage, extra-axial fluid collection, or midline shift. There is patchy small vessel disease throughout the centra semiovale bilaterally, stable. Small vessel disease is noted in both external capsules. There is no new gray-white compartment lesion. No acute infarct apparent. The bony calvarium appears intact. The mastoid air cells are clear.  IMPRESSION: Stable atrophy with supratentorial small vessel disease. No intracranial mass, hemorrhage, or extra-axial fluid collection. No acute infarct apparent.   Electronically Signed   By: Lowella Grip III M.D.   On: 07/03/2015 15:40     ASSESSMENT & PLAN:  CLL (chronic lymphocytic leukemia) She has some nonspecific spells of unknown etiology. She had easy bruising which is unknown side effects of treatment. Her total neutrophil count is a little low today. I recommend we reduce the dose of chemotherapy from 3 pills a day to 2 pills a day for now.  Neutropenia This is related to side effects of treatment. I recommend reducing the dose to 2 pills per day  Thrombocytopenia due to drugs The cause is related to splenomegaly and her underlying disease. The patient denies recent history of bleeding such as epistaxis, hematuria or hematochezia. She is asymptomatic from the thrombocytopenia apart from bruising. As above, I will reduce the dose of her medication   TIA (transient  ischemic attack) She has recurrent spells of what [place] transient ischemic attack. CT scan was ordered and showed no evidence of acute infarct. I am concerned about this. EKG today showed no evidence of atrial fibrillation but I cannot exclude the possibility of peroxisomal atrial fibrillation. She also is noted to have hypoglycemia and when I questioned her she told me about her sporadic diet. I recommend she have frequent small meals several times a day. I recommend neurology consultation and she agreed  to proceed  Hypoglycemia She is noted to have hypoglycemia. She have recurrent spells of what sounds like TIA. I am getting neurology consult. I recommend frequent small meals per day At present time, she is not symptomatic   Orders Placed This Encounter  Procedures  . Ambulatory referral to Neurology    Referral Priority:  Urgent    Referral Type:  Consultation    Referral Reason:  Specialty Services Required    Referred to Provider:  Ludwig Clarks, DO    Requested Specialty:  Neurology    Number of Visits Requested:  1   All questions were answered. The patient knows to call the clinic with any problems, questions or concerns. No barriers to learning was detected. I spent 25 minutes counseling the patient face to face. The total time spent in the appointment was 30 minutes and more than 50% was on counseling and review of test results     [facility], [place], MD 07/05/2015 10:27 AM

## 2015-07-05 NOTE — Telephone Encounter (Signed)
Rx Ibrutinib, dose decreased, faxed to [facility].

## 2015-07-05 NOTE — Assessment & Plan Note (Signed)
She is noted to have hypoglycemia. She have recurrent spells of what sounds like TIA. I am getting neurology consult. I recommend frequent small meals per day At present time, she is not symptomatic

## 2015-07-05 NOTE — Assessment & Plan Note (Signed)
She has recurrent spells of what [place] transient ischemic attack. CT scan was ordered and showed no evidence of acute infarct. I am concerned about this. EKG today showed no evidence of atrial fibrillation but I cannot exclude the possibility of peroxisomal atrial fibrillation. She also is noted to have hypoglycemia and when I questioned her she told me about her sporadic diet. I recommend she have frequent small meals several times a day. I recommend neurology consultation and she agreed to proceed

## 2015-07-05 NOTE — Assessment & Plan Note (Signed)
She has some nonspecific spells of unknown etiology. She had easy bruising which is unknown side effects of treatment. Her total neutrophil count is a little low today. I recommend we reduce the dose of chemotherapy from 3 pills a day to 2 pills a day for now.

## 2015-07-05 NOTE — Assessment & Plan Note (Signed)
The cause is related to splenomegaly and her underlying disease. The patient denies recent history of bleeding such as epistaxis, hematuria or hematochezia. She is asymptomatic from the thrombocytopenia apart from bruising. As above, I will reduce the dose of her medication

## 2015-07-11 ENCOUNTER — Encounter: Payer: Self-pay | Admitting: Neurology

## 2015-07-11 ENCOUNTER — Ambulatory Visit (INDEPENDENT_AMBULATORY_CARE_PROVIDER_SITE_OTHER): Payer: Medicare Other | Admitting: Neurology

## 2015-07-11 VITALS — BP 138/80 | HR 80 | Ht 60.0 in | Wt 116.0 lb

## 2015-07-11 DIAGNOSIS — R569 Unspecified convulsions: Secondary | ICD-10-CM | POA: Diagnosis not present

## 2015-07-11 DIAGNOSIS — R404 Transient alteration of awareness: Secondary | ICD-10-CM | POA: Diagnosis not present

## 2015-07-11 NOTE — Progress Notes (Signed)
EMA HEBNER was seen today in neurologic consultation at the request of Precious Reel, MD.  The patient is accompanied by her husband who supplements the history.  The patient experienced a fall on August 25; she had been outside cleaning in the heat and was going up an incline and she fell and she hit her head and hit her tailbone.  No LOC.  Her husband helped her up.  A few days later, she went to take a shower and her husband noted she didn't follow the normal routine (she didn't get her shower cap, towel, etc).  When she went to get out of the shower she grabbed a towel but asked her husband where the towel was.  She then walked over to a stack of towels and pulled the phone on top of her foot.  She felt a bit confused but her husband states that was the "end" of the event. Her speech was clear.  Just a few days later on August 29, she had just got up from her nap.   She told her husband that she didn't feel well (nausea).  She got up and before she even got into the bedroom door, she had pulled down her pants to go to the bathroom even though she was no where near the bathroom.  Her husband then heard a thump and she was in her closet and sat on the floor with pants down.  He asked her if she fell and she said "no, I have to use the bathroom."  She urinated on the floor and she asked for toilet paper.  She was then back to normal.  She states today that she remembers the entire event but her husband states that she didn't remember it until he relayed the story a few times.  No history of similar previously.  No events since that time.  That same day she had a CT of the head without contrast which I reviewed.  There was nothing acute on this examination.  There was atrophy and small vessel disease.  Her chemotherapy dose was subsequently decreased.  She was sent here for evaluation of possible TIA.  She had an EKG that was unremarkable for atrial fibrillation.   ALLERGIES:   Allergies  Allergen  Reactions  . Bactrim [Sulfamethoxazole W/Trimethoprim (Co-Trimoxazole)] Diarrhea and Other (See Comments)    Abdominal cramping    CURRENT MEDICATIONS:  Outpatient Encounter Prescriptions as of 07/11/2015  Medication Sig  . acyclovir (ZOVIRAX) 400 MG tablet Take 1 tablet (400 mg total) by mouth daily.  Marland Kitchen atorvastatin (LIPITOR) 20 MG tablet Take 20 mg by mouth every evening.   . calcium citrate-vitamin D (CITRACAL+D) 315-200 MG-UNIT per tablet Take 1 tablet by mouth daily.   . Cholecalciferol (VITAMIN D) 400 UNITS capsule Take 1,000 Units by mouth at bedtime.   . diphenhydrAMINE (BENADRYL) 25 mg capsule Take 25 mg by mouth at bedtime as needed for allergies (allergies).   . Echinacea 400 MG CAPS Take 1 capsule by mouth 2 (two) times daily.    Marland Kitchen ESTRACE VAGINAL 0.1 MG/GM vaginal cream Place 1 Applicatorful vaginally 2 (two) times a week.   Marland Kitchen glucosamine-chondroitin 500-400 MG tablet Take 1 tablet by mouth daily.    Marland Kitchen ibrutinib (IMBRUVICA) 140 MG capsul Take 2 capsules (280 mg total) by mouth daily.  Marland Kitchen loratadine (CLARITIN) 10 MG tablet Take 10 mg by mouth daily.    . montelukast (SINGULAIR) 10 MG tablet Take 10 mg by mouth daily.   Marland Kitchen  Multiple Vitamin (MULTIVITAMIN) tablet Take 1 tablet by mouth daily.    . ondansetron (ZOFRAN) 8 MG tablet Take 1 tablet (8 mg total) by mouth every 8 (eight) hours as needed for nausea.  . polyvinyl alcohol (ARTIFICIAL TEARS) 1.4 % ophthalmic solution Place 1 drop into both eyes 3 (three) times daily as needed for dry eyes (dry eyes).   . sodium chloride (OCEAN) 0.65 % SOLN nasal spray Place 1 spray into both nostrils 2 (two) times daily.   Marland Kitchen tiZANidine (ZANAFLEX) 4 MG tablet Take 4 mg by mouth 3 (three) times a week.  . zolpidem (AMBIEN) 5 MG tablet Take 5 mg by mouth at bedtime as needed for sleep (sleep).    No facility-administered encounter medications on file as of 07/11/2015.    PAST MEDICAL HISTORY:   Past Medical History  Diagnosis Date  .  Hypertension   . Hyperlipidemia   . Irritable bowel   . Osteoporosis   . GERD (gastroesophageal reflux disease)   . Rhinitis, chronic 09/15/2011  . Thrombocytopenia 09/15/2011  . Leukopenia 01/16/2014  . Confusion 07/03/2015  . Leukemia 08/06/09    CLL  . 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 TIA (transient ischemic attack) 07/05/2015    PAST SURGICAL HISTORY:   Past Surgical History  Procedure Laterality Date  . Tonsillectomy and adenoidectomy    . Vaginal hysterectomy    . Anterior and posterior vaginal repair    . Hemorrhoid surgery    . Laparoscopic cholecystectomy    . Anterior and posterior vaginal repair      repeat  . Broncoscopy      SOCIAL HISTORY:   Social History   Social History  . Marital Status: Married    Spouse Name: N/A  . Number of Children: N/A  . Years of Education: N/A   Occupational History  . Not on file.   Social History Main Topics  . Smoking status: Never Smoker   . Smokeless tobacco: Never Used  . Alcohol Use: No  . Drug Use: No  . Sexual Activity: No   Other Topics Concern  . Not on file   Social History Narrative    FAMILY HISTORY:   Family Status  Relation Status Death Age  . Mother Deceased     pneumonia  . Father Deceased     unknown  . Sister Alive     lung cancer, mitral valve  . Sister Alive     healthy  . Brother Deceased 89  . Brother Deceased     memory issues    ROS:  A complete 10 system review of systems was obtained and was unremarkable apart from what is mentioned above.  PHYSICAL EXAMINATION:    VITALS:   Filed Vitals:   07/11/15 0817  BP: 138/80  Pulse: 80  Height: 5' (1.524 m)  Weight: 116 lb (52.617 kg)    GEN:  Normal appears female in no acute distress.  Appears stated age. HEENT:  Normocephalic.  The mucous membranes are moist. The superficial temporal arteries are without ropiness or tenderness. Cardiovascular: Regular rate and rhythm. Lungs: Clear to auscultation  bilaterally. Neck/Heme: There are no carotid bruits noted bilaterally. Skin:  She has lesions on the face (forehead), neck, hands that the dermatologist has just frozen.  NEUROLOGICAL: Orientation:  The patient is alert and oriented x 3.  Fund of knowledge is appropriate.  Recent and remote memory intact.  Attention span and concentration normal.  Repeats and names without difficulty.  She scores a 3/4 on her clock drawing.  The hands are drawn correctly.  She rates out her name as well as the month, date and year correctly. Cranial nerves: There is good facial symmetry. The pupils are equal round and reactive to light bilaterally. Fundoscopic exam reveals clear disc margins bilaterally. Extraocular muscles are intact and visual fields are full to confrontational testing. Speech is fluent and clear. Soft palate rises symmetrically and there is no tongue deviation. Hearing is intact to conversational tone. Tone: Tone is good throughout. Sensation: Sensation is intact to light touch and pinprick throughout (facial, trunk, extremities). Vibration is intact at the bilateral big toe. There is no extinction with double simultaneous stimulation. There is no sensory dermatomal level identified. Coordination:  The patient has no difficulty with RAM's or FNF bilaterally. Motor: Strength is 5/5 in the bilateral upper and lower extremities.  Shoulder shrug is equal and symmetric. There is no pronator drift.  There are no fasciculations noted. DTR's: Deep tendon reflexes are 2/4 at the bilateral biceps, triceps, brachioradialis, patella and 1/4 at the bilateral achilles.  Plantar responses are downgoing bilaterally. Gait and Station: The patient is able to ambulate without difficulty.  She is somewhat slow and tenuous.  She has difficulty ambulating in a tandem fashion.  She is able to stand in the Romberg position.   IMPRESSION/PLAN  1.  Transient alteration of awareness  -The patient has now had 2 events that  are suspicious for complex partial seizures.  She and I discussed this today, along with her husband.  I did ask her to follow seizure and safety, which would include no driving.  -We will do a routine EEG and if that is negative, we will do an ambulatory EEG.  -She will have an MRI of the brain with and without gadolinium with seizure protocol.  -If the above are negative, then I will leave it to the patient's discretion whether or not she wants to start antiepileptics medications.  It would potentially be advisable, given the fact that her history is quite suspicious, but we decided to hold on further discussion until the results of the above are completed.  -Greater than 50% of the 45 minute visit was spent in counseling with the patient and her husband.

## 2015-07-11 NOTE — Patient Instructions (Signed)
1. We have scheduled you at [facility] for your MRI on 07/19/2015 at 9:00 am. Please arrive 15 minutes prior and [place]. If you need to reschedule for any reason please call [phone].

## 2015-07-18 ENCOUNTER — Ambulatory Visit (INDEPENDENT_AMBULATORY_CARE_PROVIDER_SITE_OTHER): Payer: Medicare Other | Admitting: Neurology

## 2015-07-18 DIAGNOSIS — R404 Transient alteration of awareness: Secondary | ICD-10-CM | POA: Diagnosis not present

## 2015-07-18 NOTE — Procedures (Signed)
ELECTROENCEPHALOGRAM REPORT  Date of Study: 07/18/2015  Patient's Name: Angelica Sullivan MRN: [id number] Date of Birth: 04-21-1933  Referring Provider: Dr. Wells Guiles Tat  Clinical History: This is an 81 year old woman with transient confusional episodes.  Medications: Zovirax, Lipitor, Citrical+D, Vitamin D, Echinacea, Benadryl, Claritin, Singulair, multivitamin, Zanaflex, Ambien, Zofran, Imbruvica, Glucosamin/Chondroitin,  Technical Summary: A multichannel digital EEG recording measured by the international 10-20 system with electrodes applied with paste and impedances below 5000 ohms performed in our laboratory with EKG monitoring in an awake and asleep patient.  Hyperventilation and photic stimulation were performed.  The digital EEG was referentially recorded, reformatted, and digitally filtered in a variety of bipolar and referential montages for optimal display.    Description: The patient is awake and asleep during the recording.  During maximal wakefulness, there is a symmetric, medium voltage 9 Hz posterior dominant rhythm that attenuates with eye opening.  There is occasional focal 2 Hz delta slowing over the left temporal region.  During drowsiness and sleep, there is an increase in theta and delta slowing of the background, at times with shifting asymmetry over the bilateral temporal regions.  Vertex waves and symmetric sleep spindles were seen.  Photic stimulation did not elicit any abnormalities During hyperventilation, focal slowing over the left temporal region is seen.  There were no epileptiform discharges or electrographic seizures seen.    EKG lead was unremarkable.  Impression: This awake and asleep EEG is abnormal due to occasional focal slowing over the left temporal region.  Clinical Correlation of the above findings indicates focal cerebral dysfunction over the left temporal region suggestive of underlying structural or physiologic abnormality. The absence of  epileptiform discharges does not exclude a clinical diagnosis of epilepsy.  If further clinical questions remain, prolonged EEG may be helpful.  Clinical correlation is advised.   Ellouise Newer, M.D.

## 2015-07-19 ENCOUNTER — Ambulatory Visit (HOSPITAL_COMMUNITY)
Admission: RE | Admit: 2015-07-19 | Discharge: 2015-07-19 | Disposition: A | Discharge status: Home / Self Care | Payer: Medicare Other | Source: Ambulatory Visit | Attending: Neurology | Admitting: Neurology

## 2015-07-19 ENCOUNTER — Telehealth: Payer: Self-pay | Admitting: Neurology

## 2015-07-19 DIAGNOSIS — R9082 White matter disease, unspecified: Secondary | ICD-10-CM | POA: Insufficient documentation

## 2015-07-19 DIAGNOSIS — G319 Degenerative disease of nervous system, unspecified: Secondary | ICD-10-CM | POA: Insufficient documentation

## 2015-07-19 DIAGNOSIS — R569 Unspecified convulsions: Secondary | ICD-10-CM

## 2015-07-19 MED ORDER — GADOBENATE DIMEGLUMINE 529 MG/ML IV SOLN
10.0000 mL | Freq: Once | INTRAVENOUS | Status: AC | PRN
Start: 2015-07-19 — End: 2015-07-19
  Administered 2015-07-19: 9 mL via INTRAVENOUS

## 2015-07-19 NOTE — Telephone Encounter (Signed)
-----   Message from [place], DO sent at 07/19/2015 11:39 AM EDT ----- Reviewed.  Severe small vessel disease.  Luvenia Starch, you can let pt/husband know that nothing new on MRI.  Just hardening of arteries in brain.

## 2015-07-19 NOTE — Telephone Encounter (Signed)
Patient made aware of MR and EEG results. She will proceed with AMB EEG.

## 2015-07-19 NOTE — Telephone Encounter (Signed)
From Dr Carles Collet Luvenia Starch, let pt/husband know that EEG didn't show seizure waves but some slowing over L front of brain. Needs ambulatory as we talked about

## 2015-07-20 ENCOUNTER — Telehealth: Payer: Self-pay | Admitting: Neurology

## 2015-07-20 NOTE — Telephone Encounter (Signed)
Pt called about scheduling appt for next week/ call back @ [phone]

## 2015-07-23 ENCOUNTER — Telehealth: Payer: Self-pay | Admitting: Neurology

## 2015-07-23 NOTE — Telephone Encounter (Signed)
Pt called and said she was returning your call to set up her 24 hour EEG/Dawn CB# [phone]

## 2015-07-25 ENCOUNTER — Ambulatory Visit (INDEPENDENT_AMBULATORY_CARE_PROVIDER_SITE_OTHER): Payer: Medicare Other | Admitting: Neurology

## 2015-07-25 DIAGNOSIS — R404 Transient alteration of awareness: Secondary | ICD-10-CM | POA: Diagnosis not present

## 2015-07-30 NOTE — Procedures (Signed)
ELECTROENCEPHALOGRAM REPORT  Dates of Recording: 07/25/2015 to 07/26/2015  Patient's Name: Angelica Sullivan MRN: [id number] Date of Birth: 13-Sep-1933  Referring Provider: Dr. Wells Guiles Tat  Procedure: 24-hour ambulatory EEG  History: This is an 81 year old woman with transient confusional episodes.  Medications: Zovirax, Lipitor, Citrical+D, Vitamin D, Echinacea, Benadryl, Claritin, Singulair, multivitamin, Zanaflex, Ambien, Zofran, Imbruvica, Glucosamin/Chondroitin  Technical Summary: This is a 24-hour multichannel digital EEG recording measured by the international 10-20 system with electrodes applied with paste and impedances below 5000 ohms performed as portable with EKG monitoring.  The digital EEG was referentially recorded, reformatted, and digitally filtered in a variety of bipolar and referential montages for optimal display.    DESCRIPTION OF RECORDING: During maximal wakefulness, the background activity consisted of a symmetric 9 Hz posterior dominant rhythm which was reactive to eye opening.  There is occasional focal 2 Hz delta slowing seen over the left temporal region. There were no epileptiform discharges seen in wakefulness.  During the recording, the patient progresses through wakefulness, drowsiness, and Stage 2 sleep. Similar occasional focal delta slowing is seen during drowsiness and sleep. Again, there were no epileptiform discharges seen.  Events: There were no push button events.   There were no electrographic seizures seen.  EKG lead was unremarkable.  IMPRESSION: This 24-hour ambulatory EEG study is abnormal due to occasional focal slowing over the left temporal region.  CLINICAL CORRELATION of the above findings indicates focal cerebral dysfunction over the left temporal region suggestive of underlying structural or physiologic abnormality. The absence of epileptiform discharges does not exclude a clinical diagnosis of epilepsy.  Typical events were not  captured. If further clinical questions remain, inpatient video EEG monitoring may be helpful.   Ellouise Newer, M.D.

## 2015-07-31 ENCOUNTER — Telehealth: Payer: Self-pay | Admitting: Neurology

## 2015-07-31 NOTE — Telephone Encounter (Signed)
-----   Message from [place], DO sent at 07/30/2015  5:05 PM EDT ----- See if pt can come in Friday morning for f/u to discuss testing with her husband

## 2015-07-31 NOTE — Telephone Encounter (Signed)
Patient is going out of town this Thursday. Appt made for next week to discuss.

## 2015-08-09 ENCOUNTER — Ambulatory Visit (INDEPENDENT_AMBULATORY_CARE_PROVIDER_SITE_OTHER): Payer: Medicare Other | Admitting: Neurology

## 2015-08-09 ENCOUNTER — Encounter: Payer: Self-pay | Admitting: Neurology

## 2015-08-09 VITALS — BP 168/70 | HR 68 | Ht 60.0 in | Wt 114.0 lb

## 2015-08-09 DIAGNOSIS — G40209 Localization-related (focal) (partial) symptomatic epilepsy and epileptic syndromes with complex partial seizures, not intractable, without status epilepticus: Secondary | ICD-10-CM | POA: Diagnosis not present

## 2015-08-09 MED ORDER — LEVETIRACETAM 500 MG PO TABS: 500.0000 mg | ORAL_TABLET | Freq: Two times a day (BID) | ORAL | Status: DC

## 2015-08-09 NOTE — Progress Notes (Signed)
Angelica Sullivan was seen today in neurologic consultation at the request of Precious Reel, MD.  The patient is accompanied by her husband who supplements the history.  The patient experienced a fall on August 25; she had been outside cleaning in the heat and was going up an incline and she fell and she hit her head and hit her tailbone.  No LOC.  Her husband helped her up.  A few days later, she went to take a shower and her husband noted she didn't follow the normal routine (she didn't get her shower cap, towel, etc).  When she went to get out of the shower she grabbed a towel but asked her husband where the towel was.  She then walked over to a stack of towels and pulled the phone on top of her foot.  She felt a bit confused but her husband states that was the "end" of the event. Her speech was clear.  Just a few days later on August 29, she had just got up from her nap.   She told her husband that she didn't feel well (nausea).  She got up and before she even got into the bedroom door, she had pulled down her pants to go to the bathroom even though she was no where near the bathroom.  Her husband then heard a thump and she was in her closet and sat on the floor with pants down.  He asked her if she fell and she said "no, I have to use the bathroom."  She urinated on the floor and she asked for toilet paper.  She was then back to normal.  She states today that she remembers the entire event but her husband states that she didn't remember it until he relayed the story a few times.  No history of similar previously.  No events since that time.  That same day she had a CT of the head without contrast which I reviewed.  There was nothing acute on this examination.  There was atrophy and small vessel disease.  Her chemotherapy dose was subsequently decreased.  She was sent here for evaluation of possible TIA.  She had an EKG that was unremarkable for atrial fibrillation.  08/09/15 update:  The patient returns today  for follow-up, accompanied by her husband who supplements the history.  The patient had an MRI of the brain since last visit on 07/19/2015.  There is evidence of very significant cerebral small vessel disease, but it was otherwise unremarkable and nonacute.  She had a routine EEG, followed by a 24 hour ambulatory EEG.  Both of these demonstrated evidence of left temporal slowing, without evidence of epileptiform activity.  The patient has had no further events since our last visit.   ALLERGIES:   Allergies  Allergen Reactions  . Bactrim [Sulfamethoxazole W/Trimethoprim (Co-Trimoxazole)] Diarrhea and Other (See Comments)    Abdominal cramping    CURRENT MEDICATIONS:  Outpatient Encounter Prescriptions as of 08/09/2015  Medication Sig  . acyclovir (ZOVIRAX) 400 MG tablet Take 1 tablet (400 mg total) by mouth daily.  Marland Kitchen atorvastatin (LIPITOR) 20 MG tablet Take 20 mg by mouth every evening.   . calcium citrate-vitamin D (CITRACAL+D) 315-200 MG-UNIT per tablet Take 1 tablet by mouth daily.   . Cholecalciferol (VITAMIN D) 400 UNITS capsule Take 1,000 Units by mouth at bedtime.   . diphenhydrAMINE (BENADRYL) 25 mg capsule Take 25 mg by mouth at bedtime as needed for allergies (allergies).   . Echinacea  400 MG CAPS Take 1 capsule by mouth 2 (two) times daily.    Marland Kitchen ESTRACE VAGINAL 0.1 MG/GM vaginal cream Place 1 Applicatorful vaginally 2 (two) times a week.   Marland Kitchen glucosamine-chondroitin 500-400 MG tablet Take 1 tablet by mouth daily.    Marland Kitchen ibrutinib (IMBRUVICA) 140 MG capsul Take 2 capsules (280 mg total) by mouth daily.  Marland Kitchen loratadine (CLARITIN) 10 MG tablet Take 10 mg by mouth daily.    . montelukast (SINGULAIR) 10 MG tablet Take 10 mg by mouth daily.   . Multiple Vitamin (MULTIVITAMIN) tablet Take 1 tablet by mouth daily.    . ondansetron (ZOFRAN) 8 MG tablet Take 1 tablet (8 mg total) by mouth every 8 (eight) hours as needed for nausea.  . polyvinyl alcohol (ARTIFICIAL TEARS) 1.4 % ophthalmic solution  Place 1 drop into both eyes 3 (three) times daily as needed for dry eyes (dry eyes).   . sodium chloride (OCEAN) 0.65 % SOLN nasal spray Place 1 spray into both nostrils 2 (two) times daily.   Marland Kitchen tiZANidine (ZANAFLEX) 4 MG tablet Take 4 mg by mouth 3 (three) times a week.  . zolpidem (AMBIEN) 5 MG tablet Take 5 mg by mouth at bedtime as needed for sleep (sleep).    No facility-administered encounter medications on file as of 08/09/2015.    PAST MEDICAL HISTORY:   Past Medical History  Diagnosis Date  . Hypertension   . Hyperlipidemia   . Irritable bowel   . Osteoporosis   . GERD (gastroesophageal reflux disease)   . Rhinitis, chronic 09/15/2011  . Thrombocytopenia 09/15/2011  . Leukopenia 01/16/2014  . Confusion 07/03/2015  . Leukemia 08/06/09    CLL  . 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 TIA (transient ischemic attack) 07/05/2015    PAST SURGICAL HISTORY:   Past Surgical History  Procedure Laterality Date  . Tonsillectomy and adenoidectomy    . Vaginal hysterectomy    . Anterior and posterior vaginal repair    . Hemorrhoid surgery    . Laparoscopic cholecystectomy    . Anterior and posterior vaginal repair      repeat  . Broncoscopy      SOCIAL HISTORY:   Social History   Social History  . Marital Status: Married    Spouse Name: N/A  . Number of Children: N/A  . Years of Education: N/A   Occupational History  . Not on file.   Social History Main Topics  . Smoking status: Never Smoker   . Smokeless tobacco: Never Used  . Alcohol Use: No  . Drug Use: No  . Sexual Activity: No   Other Topics Concern  . Not on file   Social History Narrative    FAMILY HISTORY:   Family Status  Relation Status Death Age  . Mother Deceased     pneumonia  . Father Deceased     unknown  . Sister Alive     lung cancer, mitral valve  . Sister Alive     healthy  . Brother Deceased 58  . Brother Deceased     memory issues    ROS:  A complete 10 system review of  systems was obtained and was unremarkable apart from what is mentioned above.  PHYSICAL EXAMINATION:    VITALS:   Filed Vitals:   08/09/15 0847  BP: 168/70  Pulse: 68  Height: 5' (1.524 m)  Weight: 114 lb (51.71 kg)    GEN:  Normal appears female in no acute distress.  Appears stated age. HEENT:  Normocephalic.  The mucous membranes are moist. The superficial temporal arteries are without ropiness or tenderness. Cardiovascular: Regular rate and rhythm. Lungs: Clear to auscultation bilaterally. Neck/Heme: There are no carotid bruits noted bilaterally. Skin:  She has lesions on the face (forehead), neck, hands that the dermatologist has just frozen.  NEUROLOGICAL: Orientation:  The patient is alert and oriented x 3.  Fund of knowledge is appropriate.  Recent and remote memory intact.  Attention span and concentration normal.  Repeats and names without difficulty.  She scores a 3/4 on her clock drawing.  The hands are drawn correctly.  She rates out her name as well as the month, date and year correctly. Cranial nerves: There is good facial symmetry. The pupils are equal round and reactive to light bilaterally. Fundoscopic exam reveals clear disc margins bilaterally. Extraocular muscles are intact and visual fields are full to confrontational testing. Speech is fluent and clear. Soft palate rises symmetrically and there is no tongue deviation. Hearing is intact to conversational tone. Tone: Tone is good throughout. Sensation: Sensation is intact to light touch and pinprick throughout (facial, trunk, extremities). Vibration is intact at the bilateral big toe. There is no extinction with double simultaneous stimulation. There is no sensory dermatomal level identified. Coordination:  The patient has no difficulty with RAM's or FNF bilaterally. Motor: Strength is 5/5 in the bilateral upper and lower extremities.  Shoulder shrug is equal and symmetric. There is no pronator drift.  There are no  fasciculations noted. DTR's: Deep tendon reflexes are 2/4 at the bilateral biceps, triceps, brachioradialis, patella and 1/4 at the bilateral achilles.  Plantar responses are downgoing bilaterally. Gait and Station: The patient is able to ambulate without difficulty.  She is somewhat slow and tenuous.  She has difficulty ambulating in a tandem fashion.  She is able to stand in the Romberg position.   IMPRESSION/PLAN  1.  Transient alteration of awareness  -The patient has now had 2 events that are suspicious for complex partial seizures.    -Greater than 50% of the 45 minute visit was spent in counseling with the patient and her husband. -Long discussion with the patient and her husband.  Even though her EEGs did not demonstrate any specific epileptiform activity, I am still very clinically suspicious for complex partial seizures.  Her EEGs did demonstrate left temporal slowing that was focal.  While left temporal slowing is not specific and can be seen in patients of this age group, I recommended to her that she start on an anti-epileptic medication given the history.  She was agreeable to try Keppra, 500 mg bid.  Risks, benefits, side effects and alternative therapies were discussed.  The opportunity to ask questions was given and they were answered to the best of my ability.  The patient expressed understanding and willingness to follow the outlined treatment protocols.  -talked about [place] driving laws and recommended she not drive at least for 6 months from last event (august 29).  She understood.  Sz and safety discussed 2.  F/u in 4 months, sooner should new neurology issues arise.  Much greater than 50% of this visit was spent in counseling with the patient and the family.  Total face to face time:  25 min

## 2015-09-13 ENCOUNTER — Ambulatory Visit (HOSPITAL_BASED_OUTPATIENT_CLINIC_OR_DEPARTMENT_OTHER): Payer: Medicare Other | Admitting: Hematology and Oncology

## 2015-09-13 ENCOUNTER — Telehealth: Payer: Self-pay | Admitting: Hematology and Oncology

## 2015-09-13 ENCOUNTER — Encounter: Payer: Self-pay | Admitting: Hematology and Oncology

## 2015-09-13 ENCOUNTER — Other Ambulatory Visit (HOSPITAL_BASED_OUTPATIENT_CLINIC_OR_DEPARTMENT_OTHER): Payer: Medicare Other

## 2015-09-13 VITALS — BP 137/40 | HR 67 | Temp 97.4°F | Resp 17 | Ht 60.0 in | Wt 116.6 lb

## 2015-09-13 DIAGNOSIS — M19041 Primary osteoarthritis, right hand: Secondary | ICD-10-CM | POA: Insufficient documentation

## 2015-09-13 DIAGNOSIS — D696 Thrombocytopenia, unspecified: Secondary | ICD-10-CM

## 2015-09-13 DIAGNOSIS — G4089 Other seizures: Secondary | ICD-10-CM

## 2015-09-13 DIAGNOSIS — C9110 Chronic lymphocytic leukemia of B-cell type not having achieved remission: Secondary | ICD-10-CM

## 2015-09-13 DIAGNOSIS — J310 Chronic rhinitis: Secondary | ICD-10-CM | POA: Diagnosis not present

## 2015-09-13 DIAGNOSIS — R5382 Chronic fatigue, unspecified: Secondary | ICD-10-CM

## 2015-09-13 DIAGNOSIS — L989 Disorder of the skin and subcutaneous tissue, unspecified: Secondary | ICD-10-CM

## 2015-09-13 DIAGNOSIS — G40909 Epilepsy, unspecified, not intractable, without status epilepticus: Secondary | ICD-10-CM

## 2015-09-13 DIAGNOSIS — M19042 Primary osteoarthritis, left hand: Secondary | ICD-10-CM

## 2015-09-13 LAB — CBC WITH DIFFERENTIAL/PLATELET
BASO%: 0.4 % (ref 0.0–2.0)
Basophils Absolute: 0.1 10*3/uL (ref 0.0–0.1)
EOS%: 0.5 % (ref 0.0–7.0)
Eosinophils Absolute: 0.1 10*3/uL (ref 0.0–0.5)
HCT: 42.1 % (ref 34.8–46.6)
HGB: 14 g/dL (ref 11.6–15.9)
LYMPH%: 80.2 % — AB (ref 14.0–49.7)
MCH: 29 pg (ref 25.1–34.0)
MCHC: 33.3 g/dL (ref 31.5–36.0)
MCV: 87.3 fL (ref 79.5–101.0)
MONO#: 1.1 10*3/uL — AB (ref 0.1–0.9)
MONO%: 9.4 % (ref 0.0–14.0)
NEUT%: 9.5 % — ABNORMAL LOW (ref 38.4–76.8)
NEUTROS ABS: 1.1 10*3/uL — AB (ref 1.5–6.5)
PLATELETS: 83 10*3/uL — AB (ref 145–400)
RBC: 4.82 10*6/uL (ref 3.70–5.45)
RDW: 13.4 % (ref 11.2–14.5)
WBC: 11.5 10*3/uL — AB (ref 3.9–10.3)
lymph#: 9.2 10*3/uL — ABNORMAL HIGH (ref 0.9–3.3)

## 2015-09-13 LAB — COMPREHENSIVE METABOLIC PANEL (CC13)
ALK PHOS: 48 U/L (ref 40–150)
ALT: 21 U/L (ref 0–55)
AST: 21 U/L (ref 5–34)
Albumin: 3.8 g/dL (ref 3.5–5.0)
Anion Gap: 8 mEq/L (ref 3–11)
BUN: 15.7 mg/dL (ref 7.0–26.0)
CO2: 26 meq/L (ref 22–29)
CREATININE: 0.8 mg/dL (ref 0.6–1.1)
Calcium: 9.6 mg/dL (ref 8.4–10.4)
Chloride: 104 mEq/L (ref 98–109)
EGFR: 65 mL/min/{1.73_m2} — ABNORMAL LOW (ref >90)
GLUCOSE: 104 mg/dL (ref 70–140)
Potassium: 4.4 mEq/L (ref 3.5–5.1)
SODIUM: 138 meq/L (ref 136–145)
TOTAL PROTEIN: 5.5 g/dL — AB (ref 6.4–8.3)
Total Bilirubin: 0.99 mg/dL (ref 0.20–1.20)

## 2015-09-13 LAB — LACTATE DEHYDROGENASE (CC13): LDH: 201 U/L (ref 125–245)

## 2015-09-13 MED ORDER — PREDNISONE 5 MG PO TABS: 5.0000 mg | ORAL_TABLET | Freq: Every day | ORAL | Status: DC

## 2015-09-13 NOTE — Progress Notes (Signed)
[facility] OFFICE PROGRESS NOTE  Patient Care Team: Shon Baton, MD as PCP - General (Internal Medicine) Heath Lark, MD as Consulting Physician (Hematology and Oncology)  SUMMARY OF ONCOLOGIC HISTORY: Oncology History   CLL stage IV, deletion 13q     CLL (chronic lymphocytic leukemia) ([place])   12/12/2013 Bone Marrow Biopsy Bone marrow biopsy show significant involvement of CLL   12/13/2013 Procedure Patient had placement of a port   12/14/2013 Imaging CT scan of the chest, abdomen and pelvis showed significant lymphadenopathy and splenomegaly   12/20/2013 - 12/22/2013 Hospital Admission The patient was admitted to the hospital to begin cycle 1 day 1 of chemotherapy due to risk of infusion reaction. She tolerated treatment well without major side effects.   12/20/2013 - 03/13/2014 Chemotherapy started on cycle 1 of Obinutuzumab, chlorambucil and prednisone. All treatment placed on hold on 03/13/2014 due to persistent pancytopenia.   03/28/2014 Imaging Repeat imaging study showed excellent response to treatment.   10/25/2014 Imaging She has persistent mild lymphadenopathy and splenomegaly, improved compared to previous exam   04/23/2015 Imaging Ct scan showed disease progression   05/09/2015 -  Chemotherapy She started taking ibrutinib.    INTERVAL HISTORY: Please see below for problem oriented charting. She returns for further follow-up. She was recently diagnosed with seizure disorder and was placed on Keppra. She complained of excessive fatigue and occasional dizziness. She also had skin cancer removed from the left temple area and have cryotherapy under the care of her dermatologist. She complained of arthritis pain in hands and significant nasal drainage and sinus congestion. She denies recent cough, fevers or chills. She complained of poor energy and poor appetite. The patient denies any recent signs or symptoms of bleeding such as spontaneous epistaxis, hematuria or  hematochezia.  REVIEW OF SYSTEMS:   Constitutional: Denies fevers, chills or abnormal weight loss Eyes: Denies blurriness of vision Ears, nose, mouth, throat, and face: Denies mucositis or sore throat Respiratory: Denies cough, dyspnea or wheezes Cardiovascular: Denies palpitation, chest discomfort or lower extremity swelling Gastrointestinal:  Denies nausea, heartburn or change in bowel habits Lymphatics: Denies new lymphadenopathy  Neurological:Denies numbness, tingling or new weaknesses Behavioral/Psych: Mood is stable, no new changes  All other systems were reviewed with the patient and are negative.  I have reviewed the past medical history, past surgical history, social history and family history with the patient and they are unchanged from previous note.  ALLERGIES:  is allergic to bactrim.  MEDICATIONS:  Current Outpatient Prescriptions  Medication Sig Dispense Refill  . acyclovir (ZOVIRAX) 400 MG tablet Take 1 tablet (400 mg total) by mouth daily. 90 tablet 3  . atorvastatin (LIPITOR) 20 MG tablet Take 20 mg by mouth every evening.     . calcium citrate-vitamin D (CITRACAL+D) 315-200 MG-UNIT per tablet Take 1 tablet by mouth daily.     . Cholecalciferol (VITAMIN D) 400 UNITS capsule Take 1,000 Units by mouth at bedtime.     . diphenhydrAMINE (BENADRYL) 25 mg capsule Take 25 mg by mouth at bedtime as needed for allergies (allergies).     . Echinacea 400 MG CAPS Take 1 capsule by mouth 2 (two) times daily.      Marland Kitchen ESTRACE VAGINAL 0.1 MG/GM vaginal cream Place 1 Applicatorful vaginally 2 (two) times a week.     Marland Kitchen glucosamine-chondroitin 500-400 MG tablet Take 1 tablet by mouth daily.      Marland Kitchen ibrutinib (IMBRUVICA) 140 MG capsul Take 2 capsules (280 mg total) by mouth  daily. 60 capsule 6  . levETIRAcetam (KEPPRA) 500 MG tablet Take 1 tablet (500 mg total) by mouth 2 (two) times daily. 180 tablet 1  . loratadine (CLARITIN) 10 MG tablet Take 10 mg by mouth daily.      . montelukast  (SINGULAIR) 10 MG tablet Take 10 mg by mouth daily.     . Multiple Vitamin (MULTIVITAMIN) tablet Take 1 tablet by mouth daily.      . ondansetron (ZOFRAN) 8 MG tablet Take 1 tablet (8 mg total) by mouth every 8 (eight) hours as needed for nausea. 30 tablet 3  . polyvinyl alcohol (ARTIFICIAL TEARS) 1.4 % ophthalmic solution Place 1 drop into both eyes 3 (three) times daily as needed for dry eyes (dry eyes).     . sodium chloride (OCEAN) 0.65 % SOLN nasal spray Place 1 spray into both nostrils 2 (two) times daily.     Marland Kitchen tiZANidine (ZANAFLEX) 4 MG tablet Take 4 mg by mouth 3 (three) times a week.    . zolpidem (AMBIEN) 5 MG tablet Take 5 mg by mouth at bedtime as needed for sleep (sleep).     . predniSONE (DELTASONE) 5 MG tablet Take 1 tablet (5 mg total) by mouth daily with breakfast. 14 tablet 0   No current facility-administered medications for this visit.    PHYSICAL EXAMINATION: ECOG PERFORMANCE STATUS: 1 - Symptomatic but completely ambulatory  Filed Vitals:   09/13/15 0942  BP: 137/40  Pulse: 67  Temp: 97.4 F (36.3 C)  Resp: 17   Filed Weights   09/13/15 0942  Weight: 116 lb 9.6 oz (52.889 kg)    GENERAL:alert, no distress and comfortable SKIN: Noted in skin. Multiple actinic keratosis. There is a large skin lesion on the left temple  EYES: normal, Conjunctiva are pink and non-injected, sclera clear OROPHARYNX:no exudate, no erythema and lips, buccal mucosa, and tongue normal  NECK: supple, thyroid normal size, non-tender, without nodularity LYMPH:  no palpable lymphadenopathy in the cervical, axillary or inguinal LUNGS: clear to auscultation and percussion with normal breathing effort HEART: regular rate & rhythm and no murmurs and no lower extremity edema ABDOMEN:abdomen soft, non-tender and normal bowel sounds Musculoskeletal:no cyanosis of digits and no clubbing  NEURO: alert & oriented x 3 with fluent speech, no focal motor/sensory deficits  LABORATORY DATA:  I  have reviewed the data as listed    Component Value Date/Time   NA 136 07/05/2015 0840   NA 141 12/22/2013 0530   K 4.3 07/05/2015 0840   K 4.0 12/22/2013 0530   CL 107 12/22/2013 0530   CL 99 04/08/2013 0922   CO2 26 07/05/2015 0840   CO2 22 12/22/2013 0530   GLUCOSE 57* 07/05/2015 0840   GLUCOSE 115* 12/22/2013 0530   GLUCOSE 92 04/08/2013 0922   BUN 12.0 07/05/2015 0840   BUN 26* 12/22/2013 0530   CREATININE 0.7 07/05/2015 0840   CREATININE 0.87 12/22/2013 0530   CALCIUM 9.6 07/05/2015 0840   CALCIUM 8.9 12/22/2013 0530   PROT 5.5* 07/05/2015 0840   PROT 5.1* 12/22/2013 0530   ALBUMIN 3.8 07/05/2015 0840   ALBUMIN 3.3* 12/22/2013 0530   AST 20 07/05/2015 0840   AST 49* 12/22/2013 0530   ALT 16 07/05/2015 0840   ALT 105* 12/22/2013 0530   ALKPHOS 48 07/05/2015 0840   ALKPHOS 49 12/22/2013 0530   BILITOT 0.63 07/05/2015 0840   BILITOT 0.7 12/22/2013 0530   GFRNONAA 62* 12/22/2013 0530   GFRAA 72* 12/22/2013 0530  No results found for: SPEP, UPEP  Lab Results  Component Value Date   WBC 11.5* 09/13/2015   NEUTROABS 1.1* 09/13/2015   HGB 14.0 09/13/2015   HCT 42.1 09/13/2015   MCV 87.3 09/13/2015   PLT 83* 09/13/2015      Chemistry      Component Value Date/Time   NA 136 07/05/2015 0840   NA 141 12/22/2013 0530   K 4.3 07/05/2015 0840   K 4.0 12/22/2013 0530   CL 107 12/22/2013 0530   CL 99 04/08/2013 0922   CO2 26 07/05/2015 0840   CO2 22 12/22/2013 0530   BUN 12.0 07/05/2015 0840   BUN 26* 12/22/2013 0530   CREATININE 0.7 07/05/2015 0840   CREATININE 0.87 12/22/2013 0530      Component Value Date/Time   CALCIUM 9.6 07/05/2015 0840   CALCIUM 8.9 12/22/2013 0530   ALKPHOS 48 07/05/2015 0840   ALKPHOS 49 12/22/2013 0530   AST 20 07/05/2015 0840   AST 49* 12/22/2013 0530   ALT 16 07/05/2015 0840   ALT 105* 12/22/2013 0530   BILITOT 0.63 07/05/2015 0840   BILITOT 0.7 12/22/2013 0530     ASSESSMENT & PLAN:  CLL (chronic lymphocytic  leukemia) She has some nonspecific spells of unknown etiology. She had easy bruising which is a known side effects of treatment. She tolerated reduce dosed Ibrutinib. We will continue to same. She is heading towards remission. I recommend we continue for 3 more months before we repeat imaging study.  Thrombocytopenia ([place]) This is likely due to recent treatment and possibly consumption from recent skin biopsy. The patient denies recent history of bleeding such as epistaxis, hematuria or hematochezia. She is asymptomatic from the low platelet count. I will observe for now.  she does not require transfusion now. I will continue the chemotherapy at current dose without dosage adjustment.  If the thrombocytopenia gets progressive worse in the future, I might have to delay her treatment or adjust the chemotherapy dose.    Rhinitis, chronic She has chronic rhinitis. Recent MRI show evidence of sinusitis. Clinically, she does not look like she had bacterial infection. I recommend she continues on Claritin and Singulair as prescribed. I recommend the addition of nasal decongestion spray such as Nasacort over-the-counter. I also recommended a trial of low-dose prednisone 5 mg daily for 2 weeks to see we can help with the symptoms. I suspect this might be the major cause of her fatigue.  Chronic fatigue She has acute on chronic fatigue which she attributed to recent treatment with Keppra. I reviewed the blood tests with her and showed that her hemoglobin is now 14 and that is unlikely be the cause of her fatigue. It is very well possible that her recent nasal drainage and congestion could be causing difficulties with breathing at night and hence causing fatigue. As above, I recommend a short course therapy with prednisone. If after she completes her treatment and she still has excessive fatigue, I recommend she call her neurologist to consider dose adjustment of Keppra. In addition, I will check a  TSH in the next visit  Skin lesion of face She has actinic keratosis and recent diagnosis of skin cancer.  She had recent skin biopsy and cryotherapy. I will defer to her dermatologist for further management.  Seizure disorder [facility]) She was diagnosed with recent seizure disorder and is currently taking Keppra. She complained of excessive fatigue and dizziness as side effects from Keppra. As above, I recommend a trial of  short course prednisone to address her chronic sinusitis that could be contributing to the fatigue I will defer to the neurologist for further management.   Osteoarthritis of both hands She has changes in her hands to suggest osteoarthritis. She complained of pain in the joints worse recently in the morning with stiffness. I recommend a trial short course prednisone as above.   Orders Placed This Encounter  Procedures  . CBC with Differential/Platelet    Standing Status: Future     Number of Occurrences:      Standing Expiration Date: 10/17/2016  . Comprehensive metabolic panel    Standing Status: Future     Number of Occurrences:      Standing Expiration Date: 10/17/2016  . Lactate dehydrogenase    Standing Status: Future     Number of Occurrences:      Standing Expiration Date: 10/17/2016  . TSH    Standing Status: Future     Number of Occurrences:      Standing Expiration Date: 10/17/2016   All questions were answered. The patient knows to call the clinic with any problems, questions or concerns. No barriers to learning was detected. I spent 30 minutes counseling the patient face to face. The total time spent in the appointment was 40 minutes and more than 50% was on counseling and review of test results     [facility], [place], MD 09/13/2015 10:17 AM

## 2015-09-13 NOTE — Telephone Encounter (Signed)
Gave patient avs report and appointments for February 2017.  °

## 2015-09-13 NOTE — Assessment & Plan Note (Signed)
She has acute on chronic fatigue which she attributed to recent treatment with Keppra. I reviewed the blood tests with her and showed that her hemoglobin is now 14 and that is unlikely be the cause of her fatigue. It is very well possible that her recent nasal drainage and congestion could be causing difficulties with breathing at night and hence causing fatigue. As above, I recommend a short course therapy with prednisone. If after she completes her treatment and she still has excessive fatigue, I recommend she call her neurologist to consider dose adjustment of Keppra. In addition, I will check a TSH in the next visit

## 2015-09-13 NOTE — Assessment & Plan Note (Signed)
She has actinic keratosis and recent diagnosis of skin cancer.  She had recent skin biopsy and cryotherapy. I will defer to her dermatologist for further management.

## 2015-09-13 NOTE — Assessment & Plan Note (Signed)
This is likely due to recent treatment and possibly consumption from recent skin biopsy. The patient denies recent history of bleeding such as epistaxis, hematuria or hematochezia. She is asymptomatic from the low platelet count. I will observe for now.  she does not require transfusion now. I will continue the chemotherapy at current dose without dosage adjustment.  If the thrombocytopenia gets progressive worse in the future, I might have to delay her treatment or adjust the chemotherapy dose.

## 2015-09-13 NOTE — Assessment & Plan Note (Signed)
She has some nonspecific spells of unknown etiology. She had easy bruising which is a known side effects of treatment. She tolerated reduce dosed Ibrutinib. We will continue to same. She is heading towards remission. I recommend we continue for 3 more months before we repeat imaging study.

## 2015-09-13 NOTE — Assessment & Plan Note (Signed)
She was diagnosed with recent seizure disorder and is currently taking Keppra. She complained of excessive fatigue and dizziness as side effects from Keppra. As above, I recommend a trial of short course prednisone to address her chronic sinusitis that could be contributing to the fatigue I will defer to the neurologist for further management.

## 2015-09-13 NOTE — Assessment & Plan Note (Signed)
She has changes in her hands to suggest osteoarthritis. She complained of pain in the joints worse recently in the morning with stiffness. I recommend a trial short course prednisone as above.

## 2015-09-13 NOTE — Assessment & Plan Note (Signed)
She has chronic rhinitis. Recent MRI show evidence of sinusitis. Clinically, she does not look like she had bacterial infection. I recommend she continues on Claritin and Singulair as prescribed. I recommend the addition of nasal decongestion spray such as Nasacort over-the-counter. I also recommended a trial of low-dose prednisone 5 mg daily for 2 weeks to see we can help with the symptoms. I suspect this might be the major cause of her fatigue.

## 2015-11-05 ENCOUNTER — Ambulatory Visit (INDEPENDENT_AMBULATORY_CARE_PROVIDER_SITE_OTHER): Payer: Medicare Other | Admitting: Physician Assistant

## 2015-11-05 VITALS — BP 134/44 | HR 81 | Temp 99.1°F | Resp 18 | Ht 60.0 in | Wt 119.6 lb

## 2015-11-05 DIAGNOSIS — B349 Viral infection, unspecified: Secondary | ICD-10-CM

## 2015-11-05 DIAGNOSIS — J029 Acute pharyngitis, unspecified: Secondary | ICD-10-CM

## 2015-11-05 DIAGNOSIS — H9201 Otalgia, right ear: Secondary | ICD-10-CM | POA: Diagnosis not present

## 2015-11-05 LAB — POCT RAPID STREP A (OFFICE): Rapid Strep A Screen: NEGATIVE

## 2015-11-05 MED ORDER — OSELTAMIVIR PHOSPHATE 75 MG PO CAPS
75.0000 mg | ORAL_CAPSULE | Freq: Two times a day (BID) | ORAL | Status: DC
Start: 2015-11-05 — End: 2015-12-11

## 2015-11-05 MED ORDER — MAGIC MOUTHWASH W/LIDOCAINE: 10.0000 mL | ORAL | Status: DC | PRN

## 2015-11-05 NOTE — Progress Notes (Signed)
Patient ID: Angelica Sullivan, female    DOB: [birth year], 82 y.o.   MRN: [id number]  PCP: Precious Reel, MD  Subjective:   Chief Complaint  Patient presents with  . Generalized Body Aches    x last night  . Sore Throat  . Cough    HPI Presents for evaluation of sore throat and ear pain.  Sore throat began yesterday. RIGHT ear hurts. RIGHT neck hurts below the ear. "I always have a cough." No worse. Body aches and fatigue go along with her chemo, not sure if it's any worse. Oncology follow up in February for CLL. She contacted her PCP office this morning and was advised that she may need to be seen. If she did not come here, they were going to send in Tamiflu.    Review of Systems  As above. No GI/GU symptoms.    Patient Active Problem List   Diagnosis Date Noted  . Chronic fatigue 09/13/2015  . Seizure disorder ([place]) 09/13/2015  . Osteoarthritis of both hands 09/13/2015  . TIA (transient ischemic attack) 07/05/2015  . Hypoglycemia 07/05/2015  . Confusion 07/03/2015  . Easy bruising 05/30/2015  . Neutropenia ([place]) 04/24/2015  . Lung infiltrate on CT 11/01/2014  . Hypotension 05/02/2014  . Skin lesion of face 03/30/2014  . Back pain 03/30/2014  . Leukopenia 01/16/2014  . CLL (chronic lymphocytic leukemia) ([place]) 11/08/2013  . Anemia, unspecified 08/26/2013  . Abnormal chest x-ray 08/17/2012  . Rhinitis, chronic 09/15/2011  . Thrombocytopenia ([place]) 09/15/2011  . NONSPECIFIC ABN FINDING RAD & OTH EXAM GI TRACT 04/09/2010  . GERD 02/12/2010  . IRRITABLE BOWEL SYNDROME 02/12/2010  . FECAL OCCULT BLOOD 02/12/2010     Prior to Admission medications   Medication Sig Start Date End Date Taking? Authorizing Provider  acyclovir (ZOVIRAX) 400 MG tablet Take 1 tablet (400 mg total) by mouth daily. 04/24/15  Yes Heath Lark, MD  atorvastatin (LIPITOR) 20 MG tablet Take 20 mg by mouth every evening.    Yes Historical Provider, MD  calcium citrate-vitamin D (CITRACAL+D)  315-200 MG-UNIT per tablet Take 1 tablet by mouth daily.    Yes Historical Provider, MD  Cholecalciferol (VITAMIN D) 400 UNITS capsule Take 1,000 Units by mouth at bedtime.    Yes Historical Provider, MD  diphenhydrAMINE (BENADRYL) 25 mg capsule Take 25 mg by mouth at bedtime as needed for allergies (allergies).    Yes Historical Provider, MD  Echinacea 400 MG CAPS Take 1 capsule by mouth 2 (two) times daily.     Yes Historical Provider, MD  ESTRACE VAGINAL 0.1 MG/GM vaginal cream Place 1 Applicatorful vaginally 2 (two) times a week.  10/24/13  Yes Historical Provider, MD  glucosamine-chondroitin 500-400 MG tablet Take 1 tablet by mouth daily.     Yes Historical Provider, MD  ibrutinib (IMBRUVICA) 140 MG capsul Take 2 capsules (280 mg total) by mouth daily. 07/05/15  Yes Heath Lark, MD  levETIRAcetam (KEPPRA) 500 MG tablet Take 1 tablet (500 mg total) by mouth 2 (two) times daily. 08/09/15  Yes Rebecca S Tat, DO  loratadine (CLARITIN) 10 MG tablet Take 10 mg by mouth daily.     Yes Historical Provider, MD  montelukast (SINGULAIR) 10 MG tablet Take 10 mg by mouth daily.    Yes Historical Provider, MD  ondansetron (ZOFRAN) 8 MG tablet Take 1 tablet (8 mg total) by mouth every 8 (eight) hours as needed for nausea. 12/16/13  Yes Heath Lark, MD  polyvinyl alcohol (ARTIFICIAL TEARS) 1.4 %  ophthalmic solution Place 1 drop into both eyes 3 (three) times daily as needed for dry eyes (dry eyes).    Yes Historical Provider, MD  sodium chloride (OCEAN) 0.65 % SOLN nasal spray Place 1 spray into both nostrils 2 (two) times daily.    Yes Historical Provider, MD  tiZANidine (ZANAFLEX) 4 MG tablet Take 4 mg by mouth 3 (three) times a week.   Yes Historical Provider, MD  zolpidem (AMBIEN) 5 MG tablet Take 5 mg by mouth at bedtime as needed for sleep (sleep).    Yes Historical Provider, MD  Multiple Vitamin (MULTIVITAMIN) tablet Take 1 tablet by mouth daily.      Historical Provider, MD  predniSONE (DELTASONE) 5 MG tablet  Take 1 tablet (5 mg total) by mouth daily with breakfast. Patient not taking: Reported on 11/05/2015 09/13/15   Heath Lark, MD     Allergies  Allergen Reactions  . Bactrim [Sulfamethoxazole W/Trimethoprim (Co-Trimoxazole)] Diarrhea and Other (See Comments)    Abdominal cramping       Objective:  Physical Exam  Constitutional: She is oriented to person, place, and time. Vital signs are normal. She appears well-developed and well-nourished. She is active and cooperative. No distress (frail appearing).  BP 134/44 mmHg  Pulse 81  Temp(Src) 99.1 F (37.3 C) (Oral)  Resp 18  Ht 5' (1.524 m)  Wt 119 lb 9.6 oz (54.25 kg)  BMI 23.36 kg/m2  SpO2 98%  HENT:  Head: Normocephalic and atraumatic.  Right Ear: Hearing, tympanic membrane, external ear and ear canal normal.  Left Ear: Hearing, tympanic membrane, external ear and ear canal normal.  Nose: Nose normal.  Mouth/Throat: Uvula is midline, oropharynx is clear and moist and mucous membranes are normal. No oral lesions.  Initially, TMs were obscured by debris in the canals. After irrigation, normal canals and TMs appreciated.  Eyes: Conjunctivae are normal. No scleral icterus.  Neck: Normal range of motion, full passive range of motion without pain and phonation normal. Neck supple. No thyromegaly present.  Cardiovascular: Normal rate, regular rhythm and normal heart sounds.   Pulses:      Radial pulses are 2+ on the right side, and 2+ on the left side.  Pulmonary/Chest: Effort normal and breath sounds normal.  Lymphadenopathy:       Head (right side): No tonsillar, no preauricular, no posterior auricular and no occipital adenopathy present.       Head (left side): No tonsillar, no preauricular, no posterior auricular and no occipital adenopathy present.    She has no cervical adenopathy.       Right: No supraclavicular adenopathy present.       Left: No supraclavicular adenopathy present.  Neurological: She is alert and oriented to  person, place, and time. No sensory deficit.  Skin: Skin is warm, dry and intact. No rash noted. No cyanosis or erythema. Nails show no clubbing.  Psychiatric: She has a normal mood and affect. Her speech is normal and behavior is normal.           Assessment & Plan:   1. Pharyngitis Supportive care. Acetaminophen PRN. Rest. Fluids. - POCT rapid strep A - Culture, Group A Strep - magic mouthwash w/lidocaine SOLN; Take 10 mLs by mouth every 2 (two) hours as needed for mouth pain.  Dispense: 360 mL; Refill: 0  2. Otalgia, right Likely due to #1  3. Viral syndrome Supportive care.  Anticipatory guidance.  RTC if symptoms worsen/persist. If not improving, RTC, follow-up with PCP or  contact oncologist. - oseltamivir (TAMIFLU) 75 MG capsule; Take 1 capsule (75 mg total) by mouth 2 (two) times daily.  Dispense: 10 capsule; Refill: 0   Fara Chute, PA-C Physician Assistant-Certified [facility]

## 2015-11-05 NOTE — Patient Instructions (Addendum)
Get plenty of rest and drink at least 64 ounces of water daily. You may take Tylenol if needed.

## 2015-11-07 LAB — CULTURE, GROUP A STREP: ORGANISM ID, BACTERIA: NORMAL

## 2015-11-26 ENCOUNTER — Telehealth: Payer: Self-pay | Admitting: *Deleted

## 2015-11-26 ENCOUNTER — Ambulatory Visit (HOSPITAL_BASED_OUTPATIENT_CLINIC_OR_DEPARTMENT_OTHER): Payer: Medicare Other

## 2015-11-26 ENCOUNTER — Ambulatory Visit (HOSPITAL_BASED_OUTPATIENT_CLINIC_OR_DEPARTMENT_OTHER): Payer: Medicare Other | Admitting: Hematology and Oncology

## 2015-11-26 VITALS — BP 145/49 | HR 92 | Temp 97.7°F | Resp 17 | Wt 117.6 lb

## 2015-11-26 DIAGNOSIS — D709 Neutropenia, unspecified: Secondary | ICD-10-CM

## 2015-11-26 DIAGNOSIS — J069 Acute upper respiratory infection, unspecified: Secondary | ICD-10-CM | POA: Diagnosis not present

## 2015-11-26 DIAGNOSIS — D696 Thrombocytopenia, unspecified: Secondary | ICD-10-CM

## 2015-11-26 DIAGNOSIS — D701 Agranulocytosis secondary to cancer chemotherapy: Secondary | ICD-10-CM

## 2015-11-26 DIAGNOSIS — R5383 Other fatigue: Secondary | ICD-10-CM

## 2015-11-26 DIAGNOSIS — R5382 Chronic fatigue, unspecified: Secondary | ICD-10-CM

## 2015-11-26 DIAGNOSIS — C9110 Chronic lymphocytic leukemia of B-cell type not having achieved remission: Secondary | ICD-10-CM

## 2015-11-26 DIAGNOSIS — J310 Chronic rhinitis: Secondary | ICD-10-CM

## 2015-11-26 DIAGNOSIS — T451X5A Adverse effect of antineoplastic and immunosuppressive drugs, initial encounter: Secondary | ICD-10-CM

## 2015-11-26 LAB — COMPREHENSIVE METABOLIC PANEL
ALBUMIN: 3.2 g/dL — AB (ref 3.5–5.0)
ALK PHOS: 104 U/L (ref 40–150)
ALT: 42 U/L (ref 0–55)
AST: 51 U/L — AB (ref 5–34)
Anion Gap: 8 mEq/L (ref 3–11)
BUN: 11.3 mg/dL (ref 7.0–26.0)
CALCIUM: 9.9 mg/dL (ref 8.4–10.4)
CHLORIDE: 97 meq/L — AB (ref 98–109)
CO2: 25 mEq/L (ref 22–29)
Creatinine: 0.9 mg/dL (ref 0.6–1.1)
EGFR: 63 mL/min/{1.73_m2} — AB (ref >90)
Glucose: 159 mg/dl — ABNORMAL HIGH (ref 70–140)
POTASSIUM: 4.4 meq/L (ref 3.5–5.1)
SODIUM: 130 meq/L — AB (ref 136–145)
Total Bilirubin: 2.4 mg/dL — ABNORMAL HIGH (ref 0.20–1.20)
Total Protein: 6 g/dL — ABNORMAL LOW (ref 6.4–8.3)

## 2015-11-26 LAB — CBC WITH DIFFERENTIAL/PLATELET
BASO%: 1.5 % (ref 0.0–2.0)
Basophils Absolute: 0.1 10*3/uL (ref 0.0–0.1)
EOS ABS: 0 10*3/uL (ref 0.0–0.5)
EOS%: 0.2 % (ref 0.0–7.0)
HEMATOCRIT: 36.3 % (ref 34.8–46.6)
HGB: 11.8 g/dL (ref 11.6–15.9)
LYMPH#: 1.6 10*3/uL (ref 0.9–3.3)
LYMPH%: 45.5 % (ref 14.0–49.7)
MCH: 27.8 pg (ref 25.1–34.0)
MCHC: 32.6 g/dL (ref 31.5–36.0)
MCV: 85.1 fL (ref 79.5–101.0)
MONO#: 1.2 10*3/uL — AB (ref 0.1–0.9)
MONO%: 32.1 % — ABNORMAL HIGH (ref 0.0–14.0)
NEUT%: 20.7 % — AB (ref 38.4–76.8)
NEUTROS ABS: 0.7 10*3/uL — AB (ref 1.5–6.5)
PLATELETS: 73 10*3/uL — AB (ref 145–400)
RBC: 4.27 10*6/uL (ref 3.70–5.45)
RDW: 13.4 % (ref 11.2–14.5)
WBC: 3.6 10*3/uL — AB (ref 3.9–10.3)

## 2015-11-26 LAB — TSH: TSH: 0.634 m[IU]/L (ref 0.308–3.960)

## 2015-11-26 LAB — TECHNOLOGIST REVIEW

## 2015-11-26 LAB — LACTATE DEHYDROGENASE: LDH: 260 U/L — ABNORMAL HIGH (ref 125–245)

## 2015-11-26 MED ORDER — AZITHROMYCIN 500 MG PO TABS: 500.0000 mg | ORAL_TABLET | Freq: Every day | ORAL | Status: DC

## 2015-11-26 NOTE — Telephone Encounter (Signed)
Labs before appt I have 1230, 1pm, or 2 pm today, 30 mins

## 2015-11-26 NOTE — Telephone Encounter (Signed)
Husband called on behalf of wife to request MD Alvy Bimler to see patient sooner than 12/11/15. Patient is complaining of: weakness, little SOB, chills, cough with clear mucous, fever of 100.4, no sweats. Patient is eating/drinking a little. Please advise. Message sent to MD Alvy Bimler.

## 2015-11-26 NOTE — Progress Notes (Signed)
[place] OFFICE PROGRESS NOTE  Patient Care Team: Shon Baton, MD as PCP - General (Internal Medicine) Heath Lark, MD as Consulting Physician (Hematology and Oncology) Eustace Quail Tat, DO as Consulting Physician (Neurology)  SUMMARY OF ONCOLOGIC HISTORY: Oncology History   CLL stage IV, deletion 13q     CLL (chronic lymphocytic leukemia) ([place])   12/12/2013 Bone Marrow Biopsy Bone marrow biopsy show significant involvement of CLL   12/13/2013 Procedure Patient had placement of a port   12/14/2013 Imaging CT scan of the chest, abdomen and pelvis showed significant lymphadenopathy and splenomegaly   12/20/2013 - 12/22/2013 Hospital Admission The patient was admitted to the hospital to begin cycle 1 day 1 of chemotherapy due to risk of infusion reaction. She tolerated treatment well without major side effects.   12/20/2013 - 03/13/2014 Chemotherapy started on cycle 1 of Obinutuzumab, chlorambucil and prednisone. All treatment placed on hold on 03/13/2014 due to persistent pancytopenia.   03/28/2014 Imaging Repeat imaging study showed excellent response to treatment.   10/25/2014 Imaging She has persistent mild lymphadenopathy and splenomegaly, improved compared to previous exam   04/23/2015 Imaging Ct scan showed disease progression   05/09/2015 -  Chemotherapy She started taking ibrutinib.   11/26/2015 Adverse Reaction Treatment  was placed on hold due to neutropenia and recent infection    INTERVAL HISTORY: Please see below for problem oriented charting.  she is seen urgently today because she feels unwell. She was treated with 2 courses of oral antibiotics by her primary care doctor. She thinks the name of those antibiotics are Augmentin and levofloxacin. Early January of this year, she went to an urgent care and was prescribed Tamiflu. According to her, throat swab was negative for streptococcal infection. She does not feel better. She felt excessive fatigue, with symptoms of sore  throat, nasal congestion, sneezing and coughing. She denies fevers or chills. She is weak with poor appetite. She denies recent seizure episodes. She is wondering whether her symptoms could be related to her medications.  She denies lymphadenopathy.  REVIEW OF SYSTEMS:   Eyes: Denies blurriness of vision Ears, nose, mouth, throat, and face: Denies mucositis  Cardiovascular: Denies palpitation, chest discomfort or lower extremity swelling Gastrointestinal:  Denies nausea, heartburn or change in bowel habits Skin: Denies abnormal skin rashes Lymphatics: Denies new lymphadenopathy or easy bruising Neurological:Denies numbness, tingling  Behavioral/Psych: Mood is stable, no new changes  All other systems were reviewed with the patient and are negative.  I have reviewed the past medical history, past surgical history, social history and family history with the patient and they are unchanged from previous note.  ALLERGIES:  is allergic to bactrim.  MEDICATIONS:  Current Outpatient Prescriptions  Medication Sig Dispense Refill  . acyclovir (ZOVIRAX) 400 MG tablet Take 1 tablet (400 mg total) by mouth daily. 90 tablet 3  . atorvastatin (LIPITOR) 20 MG tablet Take 20 mg by mouth every evening.     . calcium citrate-vitamin D (CITRACAL+D) 315-200 MG-UNIT per tablet Take 1 tablet by mouth daily.     . Cholecalciferol (VITAMIN D) 400 UNITS capsule Take 1,000 Units by mouth at bedtime.     . diphenhydrAMINE (BENADRYL) 25 mg capsule Take 25 mg by mouth at bedtime as needed for allergies (allergies).     . Echinacea 400 MG CAPS Take 1 capsule by mouth 2 (two) times daily.      Marland Kitchen ESTRACE VAGINAL 0.1 MG/GM vaginal cream Place 1 Applicatorful vaginally 2 (two) times a week.     Marland Kitchen  glucosamine-chondroitin 500-400 MG tablet Take 1 tablet by mouth daily.      Marland Kitchen ibrutinib (IMBRUVICA) 140 MG capsul Take 2 capsules (280 mg total) by mouth daily. 60 capsule 6  . levETIRAcetam (KEPPRA) 500 MG tablet Take 1  tablet (500 mg total) by mouth 2 (two) times daily. 180 tablet 1  . loratadine (CLARITIN) 10 MG tablet Take 10 mg by mouth daily.      . magic mouthwash w/lidocaine SOLN Take 10 mLs by mouth every 2 (two) hours as needed for mouth pain. 360 mL 0  . montelukast (SINGULAIR) 10 MG tablet Take 10 mg by mouth daily.     . Multiple Vitamin (MULTIVITAMIN) tablet Take 1 tablet by mouth daily.      . ondansetron (ZOFRAN) 8 MG tablet Take 1 tablet (8 mg total) by mouth every 8 (eight) hours as needed for nausea. 30 tablet 3  . oseltamivir (TAMIFLU) 75 MG capsule Take 1 capsule (75 mg total) by mouth 2 (two) times daily. 10 capsule 0  . polyvinyl alcohol (ARTIFICIAL TEARS) 1.4 % ophthalmic solution Place 1 drop into both eyes 3 (three) times daily as needed for dry eyes (dry eyes).     . sodium chloride (OCEAN) 0.65 % SOLN nasal spray Place 1 spray into both nostrils 2 (two) times daily.     Marland Kitchen azithromycin (ZITHROMAX) 500 MG tablet Take 1 tablet (500 mg total) by mouth daily. 3 tablet 0   No current facility-administered medications for this visit.    PHYSICAL EXAMINATION: ECOG PERFORMANCE STATUS: 2 - Symptomatic, <50% confined to bed  Filed Vitals:   11/26/15 1226  BP: 145/49  Pulse: 92  Temp: 97.7 F (36.5 C)  Resp: 17   Filed Weights   11/26/15 1226  Weight: 117 lb 9.6 oz (53.343 kg)    GENERAL:alert, no distress and comfortable SKIN: skin color, texture, turgor are normal, no rashes or significant lesions. She had multiple bruises EYES: normal, Conjunctiva are pink and non-injected, sclera clear OROPHARYNX:no exudate, no erythema and lips, buccal mucosa, and tongue normal  NECK: supple, thyroid normal size, non-tender, without nodularity LYMPH:  no palpable lymphadenopathy in the cervical, axillary or inguinal LUNGS: clear to auscultation and percussion with normal breathing effort HEART: regular rate & rhythm and no murmurs and no lower extremity edema ABDOMEN:abdomen soft, non-tender  and normal bowel sounds Musculoskeletal:no cyanosis of digits and no clubbing  NEURO: alert & oriented x 3 with fluent speech, no focal motor/sensory deficits  LABORATORY DATA:  I have reviewed the data as listed    Component Value Date/Time   NA 130* 11/26/2015 1212   NA 141 12/22/2013 0530   K 4.4 11/26/2015 1212   K 4.0 12/22/2013 0530   CL 107 12/22/2013 0530   CL 99 04/08/2013 0922   CO2 25 11/26/2015 1212   CO2 22 12/22/2013 0530   GLUCOSE 159* 11/26/2015 1212   GLUCOSE 115* 12/22/2013 0530   GLUCOSE 92 04/08/2013 0922   BUN 11.3 11/26/2015 1212   BUN 26* 12/22/2013 0530   CREATININE 0.9 11/26/2015 1212   CREATININE 0.87 12/22/2013 0530   CALCIUM 9.9 11/26/2015 1212   CALCIUM 8.9 12/22/2013 0530   PROT 6.0* 11/26/2015 1212   PROT 5.1* 12/22/2013 0530   ALBUMIN 3.2* 11/26/2015 1212   ALBUMIN 3.3* 12/22/2013 0530   AST 51* 11/26/2015 1212   AST 49* 12/22/2013 0530   ALT 42 11/26/2015 1212   ALT 105* 12/22/2013 0530   ALKPHOS 104 11/26/2015 1212  ALKPHOS 49 12/22/2013 0530   BILITOT 2.40* 11/26/2015 1212   BILITOT 0.7 12/22/2013 0530   GFRNONAA 62* 12/22/2013 0530   GFRAA 72* 12/22/2013 0530    No results found for: SPEP, UPEP  Lab Results  Component Value Date   WBC 3.6* 11/26/2015   NEUTROABS 0.7* 11/26/2015   HGB 11.8 11/26/2015   HCT 36.3 11/26/2015   MCV 85.1 11/26/2015   PLT 73* 11/26/2015      Chemistry      Component Value Date/Time   NA 130* 11/26/2015 1212   NA 141 12/22/2013 0530   K 4.4 11/26/2015 1212   K 4.0 12/22/2013 0530   CL 107 12/22/2013 0530   CL 99 04/08/2013 0922   CO2 25 11/26/2015 1212   CO2 22 12/22/2013 0530   BUN 11.3 11/26/2015 1212   BUN 26* 12/22/2013 0530   CREATININE 0.9 11/26/2015 1212   CREATININE 0.87 12/22/2013 0530      Component Value Date/Time   CALCIUM 9.9 11/26/2015 1212   CALCIUM 8.9 12/22/2013 0530   ALKPHOS 104 11/26/2015 1212   ALKPHOS 49 12/22/2013 0530   AST 51* 11/26/2015 1212   AST 49*  12/22/2013 0530   ALT 42 11/26/2015 1212   ALT 105* 12/22/2013 0530   BILITOT 2.40* 11/26/2015 1212   BILITOT 0.7 12/22/2013 0530      I review her peripheral smear myself and later with the pathologist. There were absolute neutropenia, thrombocytopenia with circulating reactive monocytes and large granular lymphocytes. Overall picture is compatible with active infection  ASSESSMENT & PLAN:  CLL (chronic lymphocytic leukemia)  She has unresolved infection despite multiple rounds of antibiotics in December 2016 and recent treatment with Tamiflu 2 weeks ago. Peripheral blood confirmed monocytosis , nonmalignant in nature but likely due to unresolved infection. I recommend holding treatment especially the patient currently being neutropenic. I will reassess again in 2 weeks  Neutropenia  This is likely related to her chemotherapy As above, I plan to put her treatment on hold There is no role for G-CSF right now as she is not septic. I will reassess in 2 weeks and if she remains neutropenic, I will order additional workup  Upper respiratory tract infection  She has recurrent, unresolved upper respiratory tract infection. 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 As above, I will stop her chemotherapy.  With lingering monocytosis and her upper URI symptoms, I will treat her with another course of oral antibiotics. I will reassess in 2 weeks.  if her symptoms remain unresolved, I will consider treating her with IVIG.  Thrombocytopenia ([place]) This is likely due to recent treatment and possibly consumption from recent skin biopsy. The patient denies recent history of bleeding such as epistaxis, hematuria or hematochezia. She is asymptomatic from the low platelet count. I will observe for now.  she does not require transfusion now. I will hold treatment   Chronic fatigue  She has chronic fatigue and felt that it could be related to her anti-seizure  medications.  the cause of her chronic fatigue is likely multifactorial, likely related to unresolved infection and possibly side effects from chemotherapy. It is not clear to me whether Keppra would actually cause chronic fatigue I reinforced the importance of her to be compliant with the anti-seizure medications. She has an appointment to see her neurologist in 2 weeks. I recommend she continues her medication for now    Orders Placed This Encounter  Procedures  . CBC with Differential/Platelet  Standing Status: Future     Number of Occurrences:      Standing Expiration Date: 12/31/2016  . Comprehensive metabolic panel    Standing Status: Future     Number of Occurrences:      Standing Expiration Date: 12/31/2016  . Lactate dehydrogenase (LDH) - [facility]    Standing Status: Future     Number of Occurrences:      Standing Expiration Date: 12/31/2016   All questions were answered. The patient knows to call the clinic with any problems, questions or concerns. No barriers to learning was detected. I spent 30 minutes counseling the patient face to face. The total time spent in the appointment was 40 minutes and more than 50% was on counseling and review of test results     [facility], [place], MD 11/27/2015 9:41 AM

## 2015-11-26 NOTE — Telephone Encounter (Signed)
Pt will be here at 12 for labs/1230 Dr Alvy Bimler

## 2015-11-27 DIAGNOSIS — J069 Acute upper respiratory infection, unspecified: Secondary | ICD-10-CM | POA: Insufficient documentation

## 2015-11-27 NOTE — Assessment & Plan Note (Signed)
This is likely due to recent treatment and possibly consumption from recent skin biopsy. The patient denies recent history of bleeding such as epistaxis, hematuria or hematochezia. She is asymptomatic from the low platelet count. I will observe for now.  she does not require transfusion now. I will hold treatment

## 2015-11-27 NOTE — Assessment & Plan Note (Signed)
This is likely related to her chemotherapy As above, I plan to put her treatment on hold There is no role for G-CSF right now as she is not septic. I will reassess in 2 weeks and if she remains neutropenic, I will order additional workup

## 2015-11-27 NOTE — Assessment & Plan Note (Signed)
She has unresolved infection despite multiple rounds of antibiotics in December 2016 and recent treatment with Tamiflu 2 weeks ago. Peripheral blood confirmed monocytosis , nonmalignant in nature but likely due to unresolved infection. I recommend holding treatment especially the patient currently being neutropenic. I will reassess again in 2 weeks

## 2015-11-27 NOTE — Assessment & Plan Note (Signed)
She has chronic fatigue and felt that it could be related to her anti-seizure medications.  the cause of her chronic fatigue is likely multifactorial, likely related to unresolved infection and possibly side effects from chemotherapy. It is not clear to me whether Keppra would actually cause chronic fatigue I reinforced the importance of her to be compliant with the anti-seizure medications. She has an appointment to see her neurologist in 2 weeks. I recommend she continues her medication for now

## 2015-11-27 NOTE — Assessment & Plan Note (Signed)
She has recurrent, unresolved upper respiratory tract infection. 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 As above, I will stop her chemotherapy.  With lingering monocytosis and her upper URI symptoms, I will treat her with another course of oral antibiotics. I will reassess in 2 weeks.  if her symptoms remain unresolved, I will consider treating her with IVIG.

## 2015-12-11 ENCOUNTER — Ambulatory Visit (INDEPENDENT_AMBULATORY_CARE_PROVIDER_SITE_OTHER): Payer: Medicare Other | Admitting: Neurology

## 2015-12-11 ENCOUNTER — Encounter: Payer: Self-pay | Admitting: Neurology

## 2015-12-11 ENCOUNTER — Other Ambulatory Visit (HOSPITAL_BASED_OUTPATIENT_CLINIC_OR_DEPARTMENT_OTHER): Payer: Medicare Other

## 2015-12-11 ENCOUNTER — Telehealth: Payer: Self-pay | Admitting: Hematology and Oncology

## 2015-12-11 ENCOUNTER — Encounter: Payer: Self-pay | Admitting: Hematology and Oncology

## 2015-12-11 ENCOUNTER — Ambulatory Visit (HOSPITAL_BASED_OUTPATIENT_CLINIC_OR_DEPARTMENT_OTHER): Payer: Medicare Other | Admitting: Hematology and Oncology

## 2015-12-11 VITALS — BP 148/60 | HR 94 | Ht 60.0 in | Wt 116.0 lb

## 2015-12-11 VITALS — BP 128/45 | HR 102 | Temp 97.9°F | Resp 17 | Wt 115.9 lb

## 2015-12-11 DIAGNOSIS — G40209 Localization-related (focal) (partial) symptomatic epilepsy and epileptic syndromes with complex partial seizures, not intractable, without status epilepticus: Secondary | ICD-10-CM

## 2015-12-11 DIAGNOSIS — C9110 Chronic lymphocytic leukemia of B-cell type not having achieved remission: Secondary | ICD-10-CM

## 2015-12-11 DIAGNOSIS — J069 Acute upper respiratory infection, unspecified: Secondary | ICD-10-CM | POA: Diagnosis not present

## 2015-12-11 DIAGNOSIS — G4089 Other seizures: Secondary | ICD-10-CM | POA: Diagnosis not present

## 2015-12-11 DIAGNOSIS — G40909 Epilepsy, unspecified, not intractable, without status epilepticus: Secondary | ICD-10-CM

## 2015-12-11 LAB — COMPREHENSIVE METABOLIC PANEL
ALK PHOS: 143 U/L (ref 40–150)
ALT: 22 U/L (ref 0–55)
AST: 20 U/L (ref 5–34)
Albumin: 3.5 g/dL (ref 3.5–5.0)
Anion Gap: 10 mEq/L (ref 3–11)
BILIRUBIN TOTAL: 0.51 mg/dL (ref 0.20–1.20)
BUN: 14.7 mg/dL (ref 7.0–26.0)
CALCIUM: 10 mg/dL (ref 8.4–10.4)
CO2: 26 mEq/L (ref 22–29)
Chloride: 105 mEq/L (ref 98–109)
Creatinine: 1 mg/dL (ref 0.6–1.1)
EGFR: 55 mL/min/{1.73_m2} — AB (ref >90)
Glucose: 90 mg/dl (ref 70–140)
POTASSIUM: 4.7 meq/L (ref 3.5–5.1)
Sodium: 140 mEq/L (ref 136–145)
TOTAL PROTEIN: 5.9 g/dL — AB (ref 6.4–8.3)

## 2015-12-11 LAB — CBC WITH DIFFERENTIAL/PLATELET
BASO%: 3.4 % — ABNORMAL HIGH (ref 0.0–2.0)
BASOS ABS: 0.1 10*3/uL (ref 0.0–0.1)
EOS ABS: 0.1 10*3/uL (ref 0.0–0.5)
EOS%: 1.6 % (ref 0.0–7.0)
HEMATOCRIT: 36.7 % (ref 34.8–46.6)
HEMOGLOBIN: 12.2 g/dL (ref 11.6–15.9)
LYMPH#: 1.4 10*3/uL (ref 0.9–3.3)
LYMPH%: 31.7 % (ref 14.0–49.7)
MCH: 28.1 pg (ref 25.1–34.0)
MCHC: 33.2 g/dL (ref 31.5–36.0)
MCV: 84.7 fL (ref 79.5–101.0)
MONO#: 0.5 10*3/uL (ref 0.1–0.9)
MONO%: 10.6 % (ref 0.0–14.0)
NEUT#: 2.3 10*3/uL (ref 1.5–6.5)
NEUT%: 52.7 % (ref 38.4–76.8)
PLATELETS: 203 10*3/uL (ref 145–400)
RBC: 4.34 10*6/uL (ref 3.70–5.45)
RDW: 14.7 % — AB (ref 11.2–14.5)
WBC: 4.4 10*3/uL (ref 3.9–10.3)

## 2015-12-11 LAB — LACTATE DEHYDROGENASE: LDH: 196 U/L (ref 125–245)

## 2015-12-11 MED ORDER — LEVETIRACETAM ER 500 MG PO TB24: 1000.0000 mg | ORAL_TABLET | Freq: Every day | ORAL | Status: DC

## 2015-12-11 NOTE — Assessment & Plan Note (Signed)
She had been treated with multiple courses of antiviral and antibiotics. Her symptoms are slowly improving. We discussed the pros and cons of IVIG and she decided not to pursue IVIG. We will continue to hold chemotherapy as above.

## 2015-12-11 NOTE — Patient Instructions (Signed)
1.  Stop regular keppra 500 mg and start keppra xr 500 mg - 2 tablets at night

## 2015-12-11 NOTE — Assessment & Plan Note (Signed)
She had seen her neurologist recently and the dose of Keppra was slightly reduced. I will defer to her neurologist for further management.

## 2015-12-11 NOTE — Progress Notes (Signed)
[place] OFFICE PROGRESS NOTE  Patient Care Team: Shon Baton, MD as PCP - General (Internal Medicine) Heath Lark, MD as Consulting Physician (Hematology and Oncology) Eustace Quail Tat, DO as Consulting Physician (Neurology)  SUMMARY OF ONCOLOGIC HISTORY: Oncology History   CLL stage IV, deletion 13q     CLL (chronic lymphocytic leukemia) ([place])   12/12/2013 Bone Marrow Biopsy Bone marrow biopsy show significant involvement of CLL   12/13/2013 Procedure Patient had placement of a port   12/14/2013 Imaging CT scan of the chest, abdomen and pelvis showed significant lymphadenopathy and splenomegaly   12/20/2013 - 12/22/2013 Hospital Admission The patient was admitted to the hospital to begin cycle 1 day 1 of chemotherapy due to risk of infusion reaction. She tolerated treatment well without major side effects.   12/20/2013 - 03/13/2014 Chemotherapy started on cycle 1 of Obinutuzumab, chlorambucil and prednisone. All treatment placed on hold on 03/13/2014 due to persistent pancytopenia.   03/28/2014 Imaging Repeat imaging study showed excellent response to treatment.   10/25/2014 Imaging She has persistent mild lymphadenopathy and splenomegaly, improved compared to previous exam   04/23/2015 Imaging Ct scan showed disease progression   05/09/2015 - 11/26/2015 Chemotherapy She started taking ibrutinib.   11/26/2015 Adverse Reaction Treatment  was placed on hold due to neutropenia and recent infection    INTERVAL HISTORY: Please see below for problem oriented charting.  she returns for further follow-up. She continues to experience profound fatigue and persistent symptoms of nasal congestion, cough and low-grade fever. She saw her primary care physician recently and had multiple evaluation and was prescribed a course of prednisone and the different antibiotic.  She has easy bruising. The patient denies any recent signs or symptoms of bleeding such as spontaneous epistaxis, hematuria or  hematochezia.   REVIEW OF SYSTEMS:   Constitutional: Denies fevers, chills or abnormal weight loss Eyes: Denies blurriness of vision Ears, nose, mouth, throat, and face: Denies mucositis or sore throat Cardiovascular: Denies palpitation, chest discomfort or lower extremity swelling Gastrointestinal:  Denies nausea, heartburn or change in bowel habits Skin: Denies abnormal skin rashes Lymphatics: Denies new lymphadenopathy  Neurological:Denies numbness, tingling or new weaknesses Behavioral/Psych: Mood is stable, no new changes  All other systems were reviewed with the patient and are negative.  I have reviewed the past medical history, past surgical history, social history and family history with the patient and they are unchanged from previous note.  ALLERGIES:  is allergic to bactrim.  MEDICATIONS:  Current Outpatient Prescriptions  Medication Sig Dispense Refill  . acyclovir (ZOVIRAX) 400 MG tablet Take 1 tablet (400 mg total) by mouth daily. 90 tablet 3  . atorvastatin (LIPITOR) 20 MG tablet Take 20 mg by mouth every evening.     . calcium citrate-vitamin D (CITRACAL+D) 315-200 MG-UNIT per tablet Take 1 tablet by mouth daily.     . cefdinir (OMNICEF) 300 MG capsule Take 300 mg by mouth 2 (two) times daily.  0  . Cholecalciferol (VITAMIN D) 400 UNITS capsule Take 1,000 Units by mouth at bedtime.     . diphenhydrAMINE (BENADRYL) 25 mg capsule Take 25 mg by mouth at bedtime as needed for allergies (allergies).     . Echinacea 400 MG CAPS Take 1 capsule by mouth 2 (two) times daily.      Marland Kitchen ESTRACE VAGINAL 0.1 MG/GM vaginal cream Place 1 Applicatorful vaginally 2 (two) times a week.     Marland Kitchen glucosamine-chondroitin 500-400 MG tablet Take 1 tablet by mouth daily.      Marland Kitchen  levETIRAcetam (KEPPRA XR) 500 MG 24 hr tablet Take 2 tablets (1,000 mg total) by mouth daily. 180 tablet 1  . loratadine (CLARITIN) 10 MG tablet Take 10 mg by mouth daily.      . montelukast (SINGULAIR) 10 MG tablet Take  10 mg by mouth daily.     . Multiple Vitamin (MULTIVITAMIN) tablet Take 1 tablet by mouth daily.      . polyvinyl alcohol (ARTIFICIAL TEARS) 1.4 % ophthalmic solution Place 1 drop into both eyes 3 (three) times daily as needed for dry eyes (dry eyes).     . sodium chloride (OCEAN) 0.65 % SOLN nasal spray Place 1 spray into both nostrils 2 (two) times daily.      No current facility-administered medications for this visit.    PHYSICAL EXAMINATION: ECOG PERFORMANCE STATUS: 1 - Symptomatic but completely ambulatory  Filed Vitals:   12/11/15 0952 12/11/15 0953  BP: 152/56 128/45  Pulse: 102   Temp: 97.9 F (36.6 C)   Resp: 17    Filed Weights   12/11/15 0952  Weight: 115 lb 14.4 oz (52.572 kg)    GENERAL:alert, no distress and comfortable SKIN: skin color, texture, turgor are normal, no rashes or significant lesions EYES: normal, Conjunctiva are pink and non-injected, sclera clear Musculoskeletal:no cyanosis of digits and no clubbing  NEURO: alert & oriented x 3 with fluent speech, no focal motor/sensory deficits  LABORATORY DATA:  I have reviewed the data as listed    Component Value Date/Time   NA 140 12/11/2015 0944   NA 141 12/22/2013 0530   K 4.7 12/11/2015 0944   K 4.0 12/22/2013 0530   CL 107 12/22/2013 0530   CL 99 04/08/2013 0922   CO2 26 12/11/2015 0944   CO2 22 12/22/2013 0530   GLUCOSE 90 12/11/2015 0944   GLUCOSE 115* 12/22/2013 0530   GLUCOSE 92 04/08/2013 0922   BUN 14.7 12/11/2015 0944   BUN 26* 12/22/2013 0530   CREATININE 1.0 12/11/2015 0944   CREATININE 0.87 12/22/2013 0530   CALCIUM 10.0 12/11/2015 0944   CALCIUM 8.9 12/22/2013 0530   PROT 5.9* 12/11/2015 0944   PROT 5.1* 12/22/2013 0530   ALBUMIN 3.5 12/11/2015 0944   ALBUMIN 3.3* 12/22/2013 0530   AST 20 12/11/2015 0944   AST 49* 12/22/2013 0530   ALT 22 12/11/2015 0944   ALT 105* 12/22/2013 0530   ALKPHOS 143 12/11/2015 0944   ALKPHOS 49 12/22/2013 0530   BILITOT 0.51 12/11/2015 0944    BILITOT 0.7 12/22/2013 0530   GFRNONAA 62* 12/22/2013 0530   GFRAA 72* 12/22/2013 0530    No results found for: SPEP, UPEP  Lab Results  Component Value Date   WBC 4.4 12/11/2015   NEUTROABS 2.3 12/11/2015   HGB 12.2 12/11/2015   HCT 36.7 12/11/2015   MCV 84.7 12/11/2015   PLT 203 12/11/2015      Chemistry      Component Value Date/Time   NA 140 12/11/2015 0944   NA 141 12/22/2013 0530   K 4.7 12/11/2015 0944   K 4.0 12/22/2013 0530   CL 107 12/22/2013 0530   CL 99 04/08/2013 0922   CO2 26 12/11/2015 0944   CO2 22 12/22/2013 0530   BUN 14.7 12/11/2015 0944   BUN 26* 12/22/2013 0530   CREATININE 1.0 12/11/2015 0944   CREATININE 0.87 12/22/2013 0530      Component Value Date/Time   CALCIUM 10.0 12/11/2015 0944   CALCIUM 8.9 12/22/2013 0530   ALKPHOS 143  12/11/2015 0944   ALKPHOS 49 12/22/2013 0530   AST 20 12/11/2015 0944   AST 49* 12/22/2013 0530   ALT 22 12/11/2015 0944   ALT 105* 12/22/2013 0530   BILITOT 0.51 12/11/2015 0944   BILITOT 0.7 12/22/2013 0530     ASSESSMENT & PLAN:  CLL (chronic lymphocytic leukemia)  Her CBC is recover back to normal since we discontinued treatment.  I recommend continue to hold treatment and plan to see her back in 2 months repeat history, physical examination and blood work  Upper respiratory tract infection  She had been treated with multiple courses of antiviral and antibiotics. Her symptoms are slowly improving. We discussed the pros and cons of IVIG and she decided not to pursue IVIG. We will continue to hold chemotherapy as above.  Seizure disorder [facility])  She had seen her neurologist recently and the dose of Keppra was slightly reduced. I will defer to her neurologist for further management.   Orders Placed This Encounter  Procedures  . CBC with Differential/Platelet    Standing Status: Future     Number of Occurrences:      Standing Expiration Date: 01/14/2017  . Comprehensive metabolic panel    Standing  Status: Future     Number of Occurrences:      Standing Expiration Date: 01/14/2017  . Lactate dehydrogenase (LDH) - [facility]    Standing Status: Future     Number of Occurrences:      Standing Expiration Date: 01/14/2017   All questions were answered. The patient knows to call the clinic with any problems, questions or concerns. No barriers to learning was detected. I spent 15 minutes counseling the patient face to face. The total time spent in the appointment was 20 minutes and more than 50% was on counseling and review of test results     [facility], [place], MD 12/11/2015 11:44 AM

## 2015-12-11 NOTE — Assessment & Plan Note (Signed)
Her CBC is recover back to normal since we discontinued treatment.  I recommend continue to hold treatment and plan to see her back in 2 months repeat history, physical examination and blood work

## 2015-12-11 NOTE — Progress Notes (Signed)
Angelica Sullivan was seen today in neurologic consultation at the request of Precious Reel, MD.  The patient is accompanied by her husband who supplements the history.  The patient experienced a fall on August 25; she had been outside cleaning in the heat and was going up an incline and she fell and she hit her head and hit her tailbone.  No LOC.  Her husband helped her up.  A few days later, she went to take a shower and her husband noted she didn't follow the normal routine (she didn't get her shower cap, towel, etc).  When she went to get out of the shower she grabbed a towel but asked her husband where the towel was.  She then walked over to a stack of towels and pulled the phone on top of her foot.  She felt a bit confused but her husband states that was the "end" of the event. Her speech was clear.  Just a few days later on August 29, she had just got up from her nap.   She told her husband that she didn't feel well (nausea).  She got up and before she even got into the bedroom door, she had pulled down her pants to go to the bathroom even though she was no where near the bathroom.  Her husband then heard a thump and she was in her closet and sat on the floor with pants down.  He asked her if she fell and she said "no, I have to use the bathroom."  She urinated on the floor and she asked for toilet paper.  She was then back to normal.  She states today that she remembers the entire event but her husband states that she didn't remember it until he relayed the story a few times.  No history of similar previously.  No events since that time.  That same day she had a CT of the head without contrast which I reviewed.  There was nothing acute on this examination.  There was atrophy and small vessel disease.  Her chemotherapy dose was subsequently decreased.  She was sent here for evaluation of possible TIA.  She had an EKG that was unremarkable for atrial fibrillation.  08/09/15 update:  The patient returns today  for follow-up, accompanied by her husband who supplements the history.  The patient had an MRI of the brain since last visit on 07/19/2015.  There is evidence of very significant cerebral small vessel disease, but it was otherwise unremarkable and nonacute.  She had a routine EEG, followed by a 24 hour ambulatory EEG.  Both of these demonstrated evidence of left temporal slowing, without evidence of epileptiform activity.  The patient has had no further events since our last visit.  12/11/15 update:  The patient follows up today, accompanied by her husband who supplements the history.  I have reviewed prior records made available to me.  Last visit, I started the patient on Keppra for what sounded like complex partial seizures.  She is on 500 mg twice a day.  She has had no further events.  She has complained of fatigue and thinks that it could be due to the [place].  States that she initially felt that things were "not clear" and not cognitively right but she feels better now.   However, she has been neutropenic and recently chemotherapy was held because of this.  She states that she also had the flu and had an "infection" of the bone marrow.  ALLERGIES:   Allergies  Allergen Reactions  . Bactrim [Sulfamethoxazole W/Trimethoprim (Co-Trimoxazole)] Diarrhea and Other (See Comments)    Abdominal cramping    CURRENT MEDICATIONS:  Outpatient Encounter Prescriptions as of 12/11/2015  Medication Sig  . acyclovir (ZOVIRAX) 400 MG tablet Take 1 tablet (400 mg total) by mouth daily.  Marland Kitchen atorvastatin (LIPITOR) 20 MG tablet Take 20 mg by mouth every evening.   . calcium citrate-vitamin D (CITRACAL+D) 315-200 MG-UNIT per tablet Take 1 tablet by mouth daily.   . cefdinir (OMNICEF) 300 MG capsule Take 300 mg by mouth 2 (two) times daily.  . Cholecalciferol (VITAMIN D) 400 UNITS capsule Take 1,000 Units by mouth at bedtime.   . diphenhydrAMINE (BENADRYL) 25 mg capsule Take 25 mg by mouth at bedtime as needed for  allergies (allergies).   . Echinacea 400 MG CAPS Take 1 capsule by mouth 2 (two) times daily.    Marland Kitchen ESTRACE VAGINAL 0.1 MG/GM vaginal cream Place 1 Applicatorful vaginally 2 (two) times a week.   Marland Kitchen glucosamine-chondroitin 500-400 MG tablet Take 1 tablet by mouth daily.    Marland Kitchen loratadine (CLARITIN) 10 MG tablet Take 10 mg by mouth daily.    . montelukast (SINGULAIR) 10 MG tablet Take 10 mg by mouth daily.   . Multiple Vitamin (MULTIVITAMIN) tablet Take 1 tablet by mouth daily.    . polyvinyl alcohol (ARTIFICIAL TEARS) 1.4 % ophthalmic solution Place 1 drop into both eyes 3 (three) times daily as needed for dry eyes (dry eyes).   . sodium chloride (OCEAN) 0.65 % SOLN nasal spray Place 1 spray into both nostrils 2 (two) times daily.   . [DISCONTINUED] levETIRAcetam (KEPPRA) 500 MG tablet Take 1 tablet (500 mg total) by mouth 2 (two) times daily.  Marland Kitchen levETIRAcetam (KEPPRA XR) 500 MG 24 hr tablet Take 2 tablets (1,000 mg total) by mouth daily.  . [DISCONTINUED] azithromycin (ZITHROMAX) 500 MG tablet Take 1 tablet (500 mg total) by mouth daily.  . [DISCONTINUED] ibrutinib (IMBRUVICA) 140 MG capsul Take 2 capsules (280 mg total) by mouth daily.  . [DISCONTINUED] magic mouthwash w/lidocaine SOLN Take 10 mLs by mouth every 2 (two) hours as needed for mouth pain.  . [DISCONTINUED] ondansetron (ZOFRAN) 8 MG tablet Take 1 tablet (8 mg total) by mouth every 8 (eight) hours as needed for nausea.  . [DISCONTINUED] oseltamivir (TAMIFLU) 75 MG capsule Take 1 capsule (75 mg total) by mouth 2 (two) times daily.   No facility-administered encounter medications on file as of 12/11/2015.    PAST MEDICAL HISTORY:   Past Medical History  Diagnosis Date  . Hypertension   . Hyperlipidemia   . Irritable bowel   . Osteoporosis   . GERD (gastroesophageal reflux disease)   . Rhinitis, chronic 09/15/2011  . Thrombocytopenia ([place]) 09/15/2011  . Leukopenia 01/16/2014  . Confusion 07/03/2015  . Leukemia ([place]) 08/06/09    CLL   . 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 TIA (transient ischemic attack) 07/05/2015    PAST SURGICAL HISTORY:   Past Surgical History  Procedure Laterality Date  . Tonsillectomy and adenoidectomy    . Vaginal hysterectomy    . Anterior and posterior vaginal repair    . Hemorrhoid surgery    . Laparoscopic cholecystectomy    . Anterior and posterior vaginal repair      repeat  . Broncoscopy      SOCIAL HISTORY:   Social History   Social History  . Marital Status: Married    Spouse Name: N/A  .  Number of Children: N/A  . Years of Education: N/A   Occupational History  . Not on file.   Social History Main Topics  . Smoking status: Never Smoker   . Smokeless tobacco: Never Used  . Alcohol Use: No  . Drug Use: No  . Sexual Activity: No   Other Topics Concern  . Not on file   Social History Narrative    FAMILY HISTORY:   Family Status  Relation Status Death Age  . Mother Deceased     pneumonia  . Father Deceased     unknown  . Sister Alive     lung cancer, mitral valve  . Sister Alive     healthy  . Brother Deceased 5  . Brother Deceased     memory issues    ROS:  A complete 10 system review of systems was obtained and was unremarkable apart from what is mentioned above.  PHYSICAL EXAMINATION:    VITALS:   Filed Vitals:   12/11/15 0845  BP: 148/60  Pulse: 94  Height: 5' (1.524 m)  Weight: 116 lb (52.617 kg)    GEN:  Normal appears female in no acute distress.  Appears stated age. HEENT:  Normocephalic.  The mucous membranes are moist. The superficial temporal arteries are without ropiness or tenderness. Cardiovascular: Regular rate and rhythm. Lungs: Clear to auscultation bilaterally. Neck/Heme: There are no carotid bruits noted bilaterally. Skin:  She has lesions on the face (forehead), neck, hands that the dermatologist has just frozen.  NEUROLOGICAL: Orientation:  The patient is alert and oriented x 3.  Looks to her husband to  answer detailed questions Cranial nerves: There is good facial symmetry. Marland Kitchen Speech is fluent and clear. Soft palate rises symmetrically and there is no tongue deviation. Hearing is intact to conversational tone. Tone: Tone is good throughout. Sensation: Sensation is intact to light touch  Coordination:  The patient has no difficulty with RAM's or FNF bilaterally. Motor: Strength is 5/5 in the bilateral upper and lower extremities.  Shoulder shrug is equal and symmetric. There is no pronator drift.  There are no fasciculations noted. Gait and Station: The patient is able to ambulate without difficulty.     IMPRESSION/PLAN  1.  Transient alteration of awareness  -The patient has now had 2 events that are suspicious for complex partial seizures.    -no further events since starting the keppra but thinks that making her sleepy and cognitively dull in the day.  Not fully convinced all from the keppra since had other medical issues going on and admits that better now but will change to keppra xr 500 mg - 2 po q hs.    -talked about [place] driving laws and recommended she not drive at least for 6 months from last event (august 29).  She understood.  Sz and safety discussed 2.  F/u in 5 months, sooner should new neurology issues arise.  Much greater than 50% of this visit was spent in counseling with the patient and the family.  Total face to face time:  25 min

## 2015-12-11 NOTE — Telephone Encounter (Signed)
Pt confirmed labs/ov per 02/07 POF, gave pt AVS and Calendar... KJ °

## 2015-12-18 ENCOUNTER — Other Ambulatory Visit: Payer: Self-pay | Admitting: Neurology

## 2015-12-19 ENCOUNTER — Telehealth: Payer: Self-pay | Admitting: *Deleted

## 2015-12-19 NOTE — Telephone Encounter (Signed)
Please call patient about her Keppra and the changes that have been made.

## 2015-12-19 NOTE — Telephone Encounter (Signed)
Discussed options with patient on Keppra. She has another bottle of non ER tablets and wants to know if she should switch. Made her aware this was switched due to her reported symptoms, so if she wanted to finish the non ER Keppra that she has that would be up to her. She states ER Keppra was a lot more expensive at [facility] and it would be cheaper for it to go to mail order in the future. Made her aware if she switches and when she needs a refill to call me instead of the pharmacy so we can send it to the mail order company. She will call with any other questions.

## 2015-12-19 NOTE — Telephone Encounter (Signed)
Patient called with several questions about her Keppra and the changes that have been made.  Please call her.

## 2016-01-30 ENCOUNTER — Telehealth: Payer: Self-pay | Admitting: *Deleted

## 2016-01-30 NOTE — Telephone Encounter (Signed)
Instructed pt to call PCP for pt's congestions/ respiratory symptoms.  Confirmed pt not on chemo (Ibrutinib any more) any more and needs to have illness managed by PCP.  He verbalized understanding and will call PCP.

## 2016-01-30 NOTE — Telephone Encounter (Signed)
Husband asks if Dr. Alvy Bimler can see pt today?  She has congestion and coughing for one week which is getting worse.  Does not think she has run any fevers, but not sure.

## 2016-02-05 ENCOUNTER — Telehealth: Payer: Self-pay | Admitting: Hematology and Oncology

## 2016-02-05 ENCOUNTER — Other Ambulatory Visit (HOSPITAL_BASED_OUTPATIENT_CLINIC_OR_DEPARTMENT_OTHER): Payer: Medicare Other

## 2016-02-05 ENCOUNTER — Ambulatory Visit (HOSPITAL_BASED_OUTPATIENT_CLINIC_OR_DEPARTMENT_OTHER): Payer: Medicare Other | Admitting: Hematology and Oncology

## 2016-02-05 ENCOUNTER — Encounter: Payer: Self-pay | Admitting: Hematology and Oncology

## 2016-02-05 VITALS — BP 120/46 | HR 75 | Temp 97.9°F | Resp 18 | Wt 115.6 lb

## 2016-02-05 DIAGNOSIS — D801 Nonfamilial hypogammaglobulinemia: Secondary | ICD-10-CM | POA: Diagnosis not present

## 2016-02-05 DIAGNOSIS — C9110 Chronic lymphocytic leukemia of B-cell type not having achieved remission: Secondary | ICD-10-CM

## 2016-02-05 DIAGNOSIS — J310 Chronic rhinitis: Secondary | ICD-10-CM | POA: Diagnosis not present

## 2016-02-05 HISTORY — DX: Nonfamilial hypogammaglobulinemia: D80.1

## 2016-02-05 LAB — COMPREHENSIVE METABOLIC PANEL
ALBUMIN: 3.2 g/dL — AB (ref 3.5–5.0)
ALK PHOS: 103 U/L (ref 40–150)
ALT: 19 U/L (ref 0–55)
ANION GAP: 8 meq/L (ref 3–11)
AST: 21 U/L (ref 5–34)
BILIRUBIN TOTAL: 0.53 mg/dL (ref 0.20–1.20)
BUN: 11.3 mg/dL (ref 7.0–26.0)
CO2: 27 meq/L (ref 22–29)
CREATININE: 0.8 mg/dL (ref 0.6–1.1)
Calcium: 10.1 mg/dL (ref 8.4–10.4)
Chloride: 103 mEq/L (ref 98–109)
EGFR: 72 mL/min/{1.73_m2} — AB (ref >90)
GLUCOSE: 71 mg/dL (ref 70–140)
Potassium: 4.7 mEq/L (ref 3.5–5.1)
Sodium: 138 mEq/L (ref 136–145)
TOTAL PROTEIN: 5.9 g/dL — AB (ref 6.4–8.3)

## 2016-02-05 LAB — CBC WITH DIFFERENTIAL/PLATELET
BASO%: 1.9 % (ref 0.0–2.0)
BASOS ABS: 0.1 10*3/uL (ref 0.0–0.1)
EOS ABS: 0.2 10*3/uL (ref 0.0–0.5)
EOS%: 2.9 % (ref 0.0–7.0)
HEMATOCRIT: 39.9 % (ref 34.8–46.6)
HGB: 13 g/dL (ref 11.6–15.9)
LYMPH%: 25.2 % (ref 14.0–49.7)
MCH: 27.9 pg (ref 25.1–34.0)
MCHC: 32.6 g/dL (ref 31.5–36.0)
MCV: 85.6 fL (ref 79.5–101.0)
MONO#: 0.8 10*3/uL (ref 0.1–0.9)
MONO%: 11 % (ref 0.0–14.0)
NEUT#: 4.1 10*3/uL (ref 1.5–6.5)
NEUT%: 59 % (ref 38.4–76.8)
PLATELETS: 147 10*3/uL (ref 145–400)
RBC: 4.66 10*6/uL (ref 3.70–5.45)
RDW: 14.7 % — ABNORMAL HIGH (ref 11.2–14.5)
WBC: 6.9 10*3/uL (ref 3.9–10.3)
lymph#: 1.7 10*3/uL (ref 0.9–3.3)

## 2016-02-05 LAB — TECHNOLOGIST REVIEW

## 2016-02-05 LAB — LACTATE DEHYDROGENASE: LDH: 257 U/L — ABNORMAL HIGH (ref 125–245)

## 2016-02-05 MED ORDER — PREDNISONE 20 MG PO TABS: 20.0000 mg | ORAL_TABLET | Freq: Every day | ORAL | Status: DC

## 2016-02-05 MED FILL — predniSONE 20 MG TABS: 20 | 10 days supply | Qty: 10 | Fill #0

## 2016-02-05 NOTE — Telephone Encounter (Signed)
Gave patient/relative avs report and appointments for April/May. Per NG she will see patient 5/8 @ 8:45 am - due to length of infusion earlier f/u time needed.

## 2016-02-05 NOTE — Progress Notes (Signed)
[place] OFFICE PROGRESS NOTE  Patient Care Team: Shon Baton, MD as PCP - General (Internal Medicine) Heath Lark, MD as Consulting Physician (Hematology and Oncology) Eustace Quail Tat, DO as Consulting Physician (Neurology)  SUMMARY OF ONCOLOGIC HISTORY: Oncology History   CLL stage IV, deletion 13q     CLL (chronic lymphocytic leukemia) ([place])   12/12/2013 Bone Marrow Biopsy Bone marrow biopsy show significant involvement of CLL   12/13/2013 Procedure Patient had placement of a port   12/14/2013 Imaging CT scan of the chest, abdomen and pelvis showed significant lymphadenopathy and splenomegaly   12/20/2013 - 12/22/2013 Hospital Admission The patient was admitted to the hospital to begin cycle 1 day 1 of chemotherapy due to risk of infusion reaction. She tolerated treatment well without major side effects.   12/20/2013 - 03/13/2014 Chemotherapy started on cycle 1 of Obinutuzumab, chlorambucil and prednisone. All treatment placed on hold on 03/13/2014 due to persistent pancytopenia.   03/28/2014 Imaging Repeat imaging study showed excellent response to treatment.   10/25/2014 Imaging She has persistent mild lymphadenopathy and splenomegaly, improved compared to previous exam   04/23/2015 Imaging Ct scan showed disease progression   05/09/2015 - 11/26/2015 Chemotherapy She started taking ibrutinib.   11/26/2015 Adverse Reaction Treatment  was placed on hold due to neutropenia and recent infection    INTERVAL HISTORY: Please see below for problem oriented charting. She is seen for further review. She complained of feeling miserable and "cannot go on like this"with significant nasal stuffiness, nasal drainage, congestion and production of excessive mucus. She was treated with another course of levofloxacin by her primary care physician's office last week. She denies fevers or chills. No new lymphadenopathy. Denies sore throat. She has some mild cough. REVIEW OF SYSTEMS:   Constitutional:  Denies fevers, chills or abnormal weight loss Eyes: Denies blurriness of vision Cardiovascular: Denies palpitation, chest discomfort or lower extremity swelling Gastrointestinal:  Denies nausea, heartburn or change in bowel habits Skin: Denies abnormal skin rashes Lymphatics: Denies new lymphadenopathy or easy bruising Neurological:Denies numbness, tingling or new weaknesses Behavioral/Psych: Mood is stable, no new changes  All other systems were reviewed with the patient and are negative.  I have reviewed the past medical history, past surgical history, social history and family history with the patient and they are unchanged from previous note.  ALLERGIES:  is allergic to bactrim.  MEDICATIONS:  Current Outpatient Prescriptions  Medication Sig Dispense Refill  . acyclovir (ZOVIRAX) 400 MG tablet Take 1 tablet (400 mg total) by mouth daily. 90 tablet 3  . atorvastatin (LIPITOR) 20 MG tablet Take 20 mg by mouth every evening.     . calcium citrate-vitamin D (CITRACAL+D) 315-200 MG-UNIT per tablet Take 1 tablet by mouth daily.     . Cholecalciferol (VITAMIN D) 400 UNITS capsule Take 1,000 Units by mouth at bedtime.     . diphenhydrAMINE (BENADRYL) 25 mg capsule Take 25 mg by mouth at bedtime as needed for allergies (allergies).     . Echinacea 400 MG CAPS Take 1 capsule by mouth 2 (two) times daily.      Marland Kitchen ESTRACE VAGINAL 0.1 MG/GM vaginal cream Place 1 Applicatorful vaginally 2 (two) times a week.     . fluticasone (FLONASE) 50 MCG/ACT nasal spray Place 2 sprays into both nostrils daily.  1  . glucosamine-chondroitin 500-400 MG tablet Take 1 tablet by mouth daily.      Marland Kitchen levETIRAcetam (KEPPRA XR) 500 MG 24 hr tablet Take 2 tablets (1,000 mg  total) by mouth daily. 180 tablet 1  . loratadine (CLARITIN) 10 MG tablet Take 10 mg by mouth daily.      . montelukast (SINGULAIR) 10 MG tablet Take 10 mg by mouth daily.     . Multiple Vitamin (MULTIVITAMIN) tablet Take 1 tablet by mouth daily.       . polyvinyl alcohol (ARTIFICIAL TEARS) 1.4 % ophthalmic solution Place 1 drop into both eyes 3 (three) times daily as needed for dry eyes (dry eyes).     Marland Kitchen PROAIR RESPICLICK 701 (90 Base) MCG/ACT AEPB Inhale 2 puffs into the lungs every 4 (four) hours as needed.  0  . sodium chloride (OCEAN) 0.65 % SOLN nasal spray Place 1 spray into both nostrils 2 (two) times daily.     . predniSONE (DELTASONE) 20 MG tablet Take 1 tablet (20 mg total) by mouth daily with breakfast. 10 tablet 0   No current facility-administered medications for this visit.    PHYSICAL EXAMINATION: ECOG PERFORMANCE STATUS: 1 - Symptomatic but completely ambulatory  Filed Vitals:   02/05/16 1028  BP: 120/46  Pulse: 75  Temp: 97.9 F (36.6 C)  Resp: 18   Filed Weights   02/05/16 1028  Weight: 115 lb 9.6 oz (52.436 kg)    GENERAL:alert, no distress and comfortable. She had signs of nasal congestion SKIN: skin color, texture, turgor are normal, no rashes or significant lesions. Noted thin skin EYES: normal, Conjunctiva are pink and non-injected, sclera clear OROPHARYNX:no exudate, no erythema and lips, buccal mucosa, and tongue normal  Musculoskeletal:no cyanosis of digits and no clubbing  NEURO: alert & oriented x 3 with fluent speech, no focal motor/sensory deficits  LABORATORY DATA:  I have reviewed the data as listed    Component Value Date/Time   NA 138 02/05/2016 1011   NA 141 12/22/2013 0530   K 4.7 02/05/2016 1011   K 4.0 12/22/2013 0530   CL 107 12/22/2013 0530   CL 99 04/08/2013 0922   CO2 27 02/05/2016 1011   CO2 22 12/22/2013 0530   GLUCOSE 71 02/05/2016 1011   GLUCOSE 115* 12/22/2013 0530   GLUCOSE 92 04/08/2013 0922   BUN 11.3 02/05/2016 1011   BUN 26* 12/22/2013 0530   CREATININE 0.8 02/05/2016 1011   CREATININE 0.87 12/22/2013 0530   CALCIUM 10.1 02/05/2016 1011   CALCIUM 8.9 12/22/2013 0530   PROT 5.9* 02/05/2016 1011   PROT 5.1* 12/22/2013 0530   ALBUMIN 3.2* 02/05/2016 1011    ALBUMIN 3.3* 12/22/2013 0530   AST 21 02/05/2016 1011   AST 49* 12/22/2013 0530   ALT 19 02/05/2016 1011   ALT 105* 12/22/2013 0530   ALKPHOS 103 02/05/2016 1011   ALKPHOS 49 12/22/2013 0530   BILITOT 0.53 02/05/2016 1011   BILITOT 0.7 12/22/2013 0530   GFRNONAA 62* 12/22/2013 0530   GFRAA 72* 12/22/2013 0530    No results found for: SPEP, UPEP  Lab Results  Component Value Date   WBC 6.9 02/05/2016   NEUTROABS 4.1 02/05/2016   HGB 13.0 02/05/2016   HCT 39.9 02/05/2016   MCV 85.6 02/05/2016   PLT 147 02/05/2016      Chemistry      Component Value Date/Time   NA 138 02/05/2016 1011   NA 141 12/22/2013 0530   K 4.7 02/05/2016 1011   K 4.0 12/22/2013 0530   CL 107 12/22/2013 0530   CL 99 04/08/2013 0922   CO2 27 02/05/2016 1011   CO2 22 12/22/2013 0530  BUN 11.3 02/05/2016 1011   BUN 26* 12/22/2013 0530   CREATININE 0.8 02/05/2016 1011   CREATININE 0.87 12/22/2013 0530      Component Value Date/Time   CALCIUM 10.1 02/05/2016 1011   CALCIUM 8.9 12/22/2013 0530   ALKPHOS 103 02/05/2016 1011   ALKPHOS 49 12/22/2013 0530   AST 21 02/05/2016 1011   AST 49* 12/22/2013 0530   ALT 19 02/05/2016 1011   ALT 105* 12/22/2013 0530   BILITOT 0.53 02/05/2016 1011   BILITOT 0.7 12/22/2013 0530       RADIOGRAPHIC STUDIES: I have personally reviewed the radiological images as listed and agreed with the findings in the report. No results found.   ASSESSMENT & PLAN:  CLL (chronic lymphocytic leukemia) There is no trace of CLL in her blood today. I recommend observation only for now.  Rhinitis, chronic She is very miserable with symptoms of chronic rhinitis and has received at least 4 courses of oral antibiotics over the last few months. I suspect the cause of this rhinitis is likely an allergic component along with viral rhinitis that is not getting better because of acquired hypogammaglobulinemia. I will give her a course of oral prednisone therapy and recommend  treatment with IVIG as outlined below. She agreed with the plan. If she does not get better after several IVIG treatment, I will refer her to see an allergist/ENT referral for further evaluation  Hypogammaglobulinemia, acquired ([place]) I have not rechecked her immunoglobulin levels since 2015. She had repeated courses of oral antibiotics and is not getting better with symptoms of significant nasal drainage and sinusitis. She is miserable. 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 The risks, benefits, side effects of treatment were fully discussed with the patient and she agreed with the plan of care. Plan to give her 1 g/kg IVIG every 4 weeks to see we could lessen her symptoms of recurrent sinusitis.   Orders Placed This Encounter  Procedures  . IgG, IgA, IgM    Standing Status: Future     Number of Occurrences: 1     Standing Expiration Date: 03/11/2017  . CBC with Differential/Platelet    Standing Status: Future     Number of Occurrences:      Standing Expiration Date: 03/11/2017   All questions were answered. The patient knows to call the clinic with any problems, questions or concerns. No barriers to learning was detected. I spent 25 minutes counseling the patient face to face. The total time spent in the appointment was 40 minutes and more than 50% was on counseling and review of test results     [facility], [place], MD 02/05/2016 3:13 PM

## 2016-02-05 NOTE — Assessment & Plan Note (Signed)
I have not rechecked her immunoglobulin levels since 2015. She had repeated courses of oral antibiotics and is not getting better with symptoms of significant nasal drainage and sinusitis. She is miserable. 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 The risks, benefits, side effects of treatment were fully discussed with the patient and she agreed with the plan of care. Plan to give her 1 g/kg IVIG every 4 weeks to see we could lessen her symptoms of recurrent sinusitis.

## 2016-02-05 NOTE — Assessment & Plan Note (Signed)
There is no trace of CLL in her blood today. I recommend observation only for now.

## 2016-02-05 NOTE — Assessment & Plan Note (Signed)
She is very miserable with symptoms of chronic rhinitis and has received at least 4 courses of oral antibiotics over the last few months. I suspect the cause of this rhinitis is likely an allergic component along with viral rhinitis that is not getting better because of acquired hypogammaglobulinemia. I will give her a course of oral prednisone therapy and recommend treatment with IVIG as outlined below. She agreed with the plan. If she does not get better after several IVIG treatment, I will refer her to see an allergist/ENT referral for further evaluation

## 2016-02-06 ENCOUNTER — Telehealth: Payer: Self-pay | Admitting: *Deleted

## 2016-02-06 LAB — IGG, IGA, IGM
IgA, Qn, Serum: 11 mg/dL — ABNORMAL LOW (ref 64–422)
IgG, Qn, Serum: 121 mg/dL — ABNORMAL LOW (ref 700–1600)
IgM, Qn, Serum: 6 mg/dL — ABNORMAL LOW (ref 26–217)

## 2016-02-06 NOTE — Telephone Encounter (Signed)
Informed pt of Dr. Calton Dach message below that her immunoglobulins are very low.   Pt verbalized understanding and confirms her appt for IVIG on Monday 4/10.

## 2016-02-06 NOTE — Telephone Encounter (Signed)
-----   Message from Heath Lark, MD sent at 02/06/2016  9:07 AM EDT ----- Regarding: labs pls let her know all immunoglobulin levels are very low ----- Message -----    From: [facility] Interface    Sent: 02/05/2016  10:23 AM      To: Heath Lark, MD

## 2016-02-11 ENCOUNTER — Ambulatory Visit (HOSPITAL_BASED_OUTPATIENT_CLINIC_OR_DEPARTMENT_OTHER): Payer: Medicare Other

## 2016-02-11 VITALS — BP 156/63 | HR 87 | Temp 97.4°F | Resp 18

## 2016-02-11 DIAGNOSIS — D801 Nonfamilial hypogammaglobulinemia: Secondary | ICD-10-CM | POA: Diagnosis not present

## 2016-02-11 DIAGNOSIS — C9110 Chronic lymphocytic leukemia of B-cell type not having achieved remission: Secondary | ICD-10-CM

## 2016-02-11 MED ORDER — IMMUNE GLOBULIN (HUMAN) 10 GM/100ML IV SOLN
1.0000 g/kg | Freq: Once | INTRAVENOUS | Status: AC
  Administered 2016-02-11: 50 g via INTRAVENOUS
  Filled 2016-02-11: qty 200

## 2016-02-11 MED ORDER — SODIUM CHLORIDE 0.9 % IV SOLN
Freq: Once | INTRAVENOUS | Status: AC
  Administered 2016-02-11: 10:00:00 via INTRAVENOUS

## 2016-02-11 MED ORDER — DIPHENHYDRAMINE HCL 25 MG PO TABS
25.0000 mg | ORAL_TABLET | Freq: Once | ORAL | Status: AC
  Administered 2016-02-11: 25 mg via ORAL
  Filled 2016-02-11: qty 1

## 2016-02-11 MED ORDER — ACETAMINOPHEN 325 MG PO TABS
ORAL_TABLET | ORAL | Status: AC
  Filled 2016-02-11: qty 2

## 2016-02-11 MED ORDER — DIPHENHYDRAMINE HCL 25 MG PO CAPS
ORAL_CAPSULE | ORAL | Status: AC
  Filled 2016-02-11: qty 1

## 2016-02-11 MED ORDER — ACETAMINOPHEN 325 MG PO TABS
650.0000 mg | ORAL_TABLET | Freq: Once | ORAL | Status: AC
  Administered 2016-02-11: 650 mg via ORAL

## 2016-02-11 NOTE — Patient Instructions (Addendum)

## 2016-02-12 ENCOUNTER — Encounter: Payer: Self-pay | Admitting: Hematology and Oncology

## 2016-02-13 ENCOUNTER — Telehealth: Payer: Self-pay | Admitting: *Deleted

## 2016-02-13 NOTE — Telephone Encounter (Signed)
-----   Message from Lucile Crater, RN sent at 02/12/2016  6:12 PM EDT ----- Regarding: 1st time IVIG Dr. Alvy Bimler Pt did great. No reaction.

## 2016-02-13 NOTE — Telephone Encounter (Signed)
Called patient to follow up on IVIG- states she is doing well. Denies any problems

## 2016-03-10 ENCOUNTER — Telehealth: Payer: Self-pay | Admitting: *Deleted

## 2016-03-10 ENCOUNTER — Ambulatory Visit (HOSPITAL_BASED_OUTPATIENT_CLINIC_OR_DEPARTMENT_OTHER): Payer: Medicare Other | Admitting: Hematology and Oncology

## 2016-03-10 ENCOUNTER — Encounter: Payer: Self-pay | Admitting: Hematology and Oncology

## 2016-03-10 ENCOUNTER — Ambulatory Visit (HOSPITAL_BASED_OUTPATIENT_CLINIC_OR_DEPARTMENT_OTHER): Payer: Medicare Other

## 2016-03-10 ENCOUNTER — Telehealth: Payer: Self-pay | Admitting: Hematology and Oncology

## 2016-03-10 ENCOUNTER — Other Ambulatory Visit (HOSPITAL_BASED_OUTPATIENT_CLINIC_OR_DEPARTMENT_OTHER): Payer: Medicare Other

## 2016-03-10 VITALS — BP 126/43 | HR 73 | Temp 97.7°F | Resp 17 | Ht 60.0 in | Wt 117.5 lb

## 2016-03-10 VITALS — BP 112/47 | HR 60 | Temp 97.5°F | Resp 16

## 2016-03-10 DIAGNOSIS — D696 Thrombocytopenia, unspecified: Secondary | ICD-10-CM

## 2016-03-10 DIAGNOSIS — C9110 Chronic lymphocytic leukemia of B-cell type not having achieved remission: Secondary | ICD-10-CM

## 2016-03-10 DIAGNOSIS — D801 Nonfamilial hypogammaglobulinemia: Secondary | ICD-10-CM

## 2016-03-10 LAB — CBC WITH DIFFERENTIAL/PLATELET
BASO%: 0.5 % (ref 0.0–2.0)
BASOS ABS: 0 10*3/uL (ref 0.0–0.1)
EOS%: 1.3 % (ref 0.0–7.0)
Eosinophils Absolute: 0.1 10*3/uL (ref 0.0–0.5)
HCT: 38.1 % (ref 34.8–46.6)
HGB: 13.1 g/dL (ref 11.6–15.9)
LYMPH%: 33.7 % (ref 14.0–49.7)
MCH: 29.5 pg (ref 25.1–34.0)
MCHC: 34.4 g/dL (ref 31.5–36.0)
MCV: 85.8 fL (ref 79.5–101.0)
MONO#: 1.3 10*3/uL — ABNORMAL HIGH (ref 0.1–0.9)
MONO%: 21 % — AB (ref 0.0–14.0)
NEUT#: 2.7 10*3/uL (ref 1.5–6.5)
NEUT%: 43.5 % (ref 38.4–76.8)
Platelets: 82 10*3/uL — ABNORMAL LOW (ref 145–400)
RBC: 4.44 10*6/uL (ref 3.70–5.45)
RDW: 14.9 % — AB (ref 11.2–14.5)
WBC: 6.2 10*3/uL (ref 3.9–10.3)
lymph#: 2.1 10*3/uL (ref 0.9–3.3)
nRBC: 0 % (ref 0–0)

## 2016-03-10 LAB — TECHNOLOGIST REVIEW

## 2016-03-10 MED ORDER — SODIUM CHLORIDE 0.9 % IV SOLN
Freq: Once | INTRAVENOUS | Status: AC
  Administered 2016-03-10: 10:00:00 via INTRAVENOUS

## 2016-03-10 MED ORDER — ACYCLOVIR 400 MG PO TABS: 400.0000 mg | ORAL_TABLET | Freq: Every day | ORAL | Status: DC

## 2016-03-10 MED ORDER — DIPHENHYDRAMINE HCL 25 MG PO CAPS
ORAL_CAPSULE | ORAL | Status: AC
  Filled 2016-03-10: qty 1

## 2016-03-10 MED ORDER — ACETAMINOPHEN 325 MG PO TABS
ORAL_TABLET | ORAL | Status: AC
  Filled 2016-03-10: qty 2

## 2016-03-10 MED ORDER — ACETAMINOPHEN 325 MG PO TABS
650.0000 mg | ORAL_TABLET | Freq: Four times a day (QID) | ORAL | Status: DC | PRN
  Administered 2016-03-10: 650 mg via ORAL

## 2016-03-10 MED ORDER — DIPHENHYDRAMINE HCL 25 MG PO TABS
25.0000 mg | ORAL_TABLET | Freq: Once | ORAL | Status: AC
  Administered 2016-03-10: 25 mg via ORAL
  Filled 2016-03-10: qty 1

## 2016-03-10 MED ORDER — IMMUNE GLOBULIN (HUMAN) 10 GM/100ML IV SOLN
50.0000 g | Freq: Once | INTRAVENOUS | Status: AC
  Administered 2016-03-10: 50 g via INTRAVENOUS
  Filled 2016-03-10: qty 500

## 2016-03-10 NOTE — Patient Instructions (Signed)

## 2016-03-10 NOTE — Telephone Encounter (Signed)
Gave pt appt & avs °

## 2016-03-10 NOTE — Assessment & Plan Note (Signed)
There is no trace of CLL in her blood today. WBC is normal I recommend observation only for now.

## 2016-03-10 NOTE — Assessment & Plan Note (Signed)
She has intermittent fluctuation of thrombocytopenia. It could be related to CLL, ITP, or bone marrow suppression from recurrent infection. She is not symptomatic apart from minor bruising. I will observe for now. I will continue to monitor CBC on the monthly basis and we will consider further evaluation if her platelet count dropped to closer to 50,000.

## 2016-03-10 NOTE — Assessment & Plan Note (Signed)
Repeat immunoglobulin levels are very low, related to CLL and recent treatment. She had repeated courses of oral antibiotics and is not getting better with symptoms of significant nasal drainage and sinusitis. She is miserable. 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 The risks, benefits, side effects of treatment were fully discussed with the patient and she agreed with the plan of care. After her last IVIG infusion, she felt a little better but still has poor energy. She denies infusion reaction. I will continue monthly infusion and see her back in 3 months for further assessment.

## 2016-03-10 NOTE — Progress Notes (Signed)
[place] OFFICE PROGRESS NOTE  Patient Care Team: Shon Baton, MD as PCP - General (Internal Medicine) Heath Lark, MD as Consulting Physician (Hematology and Oncology) Eustace Quail Tat, DO as Consulting Physician (Neurology)  SUMMARY OF ONCOLOGIC HISTORY: Oncology History   CLL stage IV, deletion 13q     CLL (chronic lymphocytic leukemia) ([place])   12/12/2013 Bone Marrow Biopsy Bone marrow biopsy show significant involvement of CLL   12/13/2013 Procedure Patient had placement of a port   12/14/2013 Imaging CT scan of the chest, abdomen and pelvis showed significant lymphadenopathy and splenomegaly   12/20/2013 - 12/22/2013 Hospital Admission The patient was admitted to the hospital to begin cycle 1 day 1 of chemotherapy due to risk of infusion reaction. She tolerated treatment well without major side effects.   12/20/2013 - 03/13/2014 Chemotherapy started on cycle 1 of Obinutuzumab, chlorambucil and prednisone. All treatment placed on hold on 03/13/2014 due to persistent pancytopenia.   03/28/2014 Imaging Repeat imaging study showed excellent response to treatment.   10/25/2014 Imaging She has persistent mild lymphadenopathy and splenomegaly, improved compared to previous exam   04/23/2015 Imaging Ct scan showed disease progression   05/09/2015 - 11/26/2015 Chemotherapy She started taking ibrutinib.   11/26/2015 Adverse Reaction Treatment  was placed on hold due to neutropenia and recent infection   02/11/2016 -  Chemotherapy She is started on monthly IVIG Rx for recurrent infection and acquired panhypogammaglobulinemia    INTERVAL HISTORY: Please see below for problem oriented charting. She returns for further follow-up. She feels better until this past weekend she started to have nasal congestion again. Overall, the IVIG may have helped a little bit but she complained of persistent fatigue Denies new lymphadenopathy.  REVIEW OF SYSTEMS:   Constitutional: Denies fevers, chills or  abnormal weight loss Eyes: Denies blurriness of vision Ears, nose, mouth, throat, and face: Denies mucositis or sore throat Respiratory: Denies cough, dyspnea or wheezes Cardiovascular: Denies palpitation, chest discomfort or lower extremity swelling Gastrointestinal:  Denies nausea, heartburn or change in bowel habits Skin: Denies abnormal skin rashes Lymphatics: Denies new lymphadenopathy or easy bruising Neurological:Denies numbness, tingling or new weaknesses Behavioral/Psych: Mood is stable, no new changes  All other systems were reviewed with the patient and are negative.  I have reviewed the past medical history, past surgical history, social history and family history with the patient and they are unchanged from previous note.  ALLERGIES:  is allergic to bactrim.  MEDICATIONS:  Current Outpatient Prescriptions  Medication Sig Dispense Refill  . acyclovir (ZOVIRAX) 400 MG tablet Take 1 tablet (400 mg total) by mouth daily. 90 tablet 3  . atorvastatin (LIPITOR) 20 MG tablet Take 20 mg by mouth every evening.     . calcium citrate-vitamin D (CITRACAL+D) 315-200 MG-UNIT per tablet Take 1 tablet by mouth daily.     . Cholecalciferol (VITAMIN D) 400 UNITS capsule Take 1,000 Units by mouth at bedtime.     . diphenhydrAMINE (BENADRYL) 25 mg capsule Take 25 mg by mouth at bedtime as needed for allergies (allergies).     . Echinacea 400 MG CAPS Take 1 capsule by mouth 2 (two) times daily.      Marland Kitchen ESTRACE VAGINAL 0.1 MG/GM vaginal cream Place 1 Applicatorful vaginally 2 (two) times a week.     . fluticasone (FLONASE) 50 MCG/ACT nasal spray Place 2 sprays into both nostrils daily.  1  . glucosamine-chondroitin 500-400 MG tablet Take 1 tablet by mouth daily.      Marland Kitchen  levETIRAcetam (KEPPRA XR) 500 MG 24 hr tablet Take 2 tablets (1,000 mg total) by mouth daily. 180 tablet 1  . loratadine (CLARITIN) 10 MG tablet Take 10 mg by mouth daily.      . montelukast (SINGULAIR) 10 MG tablet Take 10 mg by  mouth daily.     . Multiple Vitamin (MULTIVITAMIN) tablet Take 1 tablet by mouth daily.      . sodium chloride (OCEAN) 0.65 % SOLN nasal spray Place 1 spray into both nostrils 2 (two) times daily.     . polyvinyl alcohol (ARTIFICIAL TEARS) 1.4 % ophthalmic solution Place 1 drop into both eyes 3 (three) times daily as needed for dry eyes (dry eyes). Reported on 03/10/2016     No current facility-administered medications for this visit.   Facility-Administered Medications Ordered in Other Visits  Medication Dose Route Frequency Provider Last Rate Last Dose  . acetaminophen (TYLENOL) tablet 650 mg  650 mg Oral Q6H PRN Heath Lark, MD   650 mg at 03/10/16 0954    PHYSICAL EXAMINATION: ECOG PERFORMANCE STATUS: 0 - Asymptomatic  Filed Vitals:   03/10/16 0851  BP: 126/43  Pulse: 73  Temp: 97.7 F (36.5 C)  Resp: 17   Filed Weights   03/10/16 0851  Weight: 117 lb 8 oz (53.298 kg)    GENERAL:alert, no distress and comfortable SKIN: skin color, texture, turgor are normal, no rashes or significant lesions. Noted minor bruising EYES: normal, Conjunctiva are pink and non-injected, sclera clear Musculoskeletal:no cyanosis of digits and no clubbing  NEURO: alert & oriented x 3 with fluent speech, no focal motor/sensory deficits  LABORATORY DATA:  I have reviewed the data as listed    Component Value Date/Time   NA 138 02/05/2016 1011   NA 141 12/22/2013 0530   K 4.7 02/05/2016 1011   K 4.0 12/22/2013 0530   CL 107 12/22/2013 0530   CL 99 04/08/2013 0922   CO2 27 02/05/2016 1011   CO2 22 12/22/2013 0530   GLUCOSE 71 02/05/2016 1011   GLUCOSE 115* 12/22/2013 0530   GLUCOSE 92 04/08/2013 0922   BUN 11.3 02/05/2016 1011   BUN 26* 12/22/2013 0530   CREATININE 0.8 02/05/2016 1011   CREATININE 0.87 12/22/2013 0530   CALCIUM 10.1 02/05/2016 1011   CALCIUM 8.9 12/22/2013 0530   PROT 5.9* 02/05/2016 1011   PROT 5.1* 12/22/2013 0530   ALBUMIN 3.2* 02/05/2016 1011   ALBUMIN 3.3*  12/22/2013 0530   AST 21 02/05/2016 1011   AST 49* 12/22/2013 0530   ALT 19 02/05/2016 1011   ALT 105* 12/22/2013 0530   ALKPHOS 103 02/05/2016 1011   ALKPHOS 49 12/22/2013 0530   BILITOT 0.53 02/05/2016 1011   BILITOT 0.7 12/22/2013 0530   GFRNONAA 62* 12/22/2013 0530   GFRAA 72* 12/22/2013 0530    No results found for: SPEP, UPEP  Lab Results  Component Value Date   WBC 6.2 03/10/2016   NEUTROABS 2.7 03/10/2016   HGB 13.1 03/10/2016   HCT 38.1 03/10/2016   MCV 85.8 03/10/2016   PLT 82* 03/10/2016      Chemistry      Component Value Date/Time   NA 138 02/05/2016 1011   NA 141 12/22/2013 0530   K 4.7 02/05/2016 1011   K 4.0 12/22/2013 0530   CL 107 12/22/2013 0530   CL 99 04/08/2013 0922   CO2 27 02/05/2016 1011   CO2 22 12/22/2013 0530   BUN 11.3 02/05/2016 1011   BUN 26* 12/22/2013  0530   CREATININE 0.8 02/05/2016 1011   CREATININE 0.87 12/22/2013 0530      Component Value Date/Time   CALCIUM 10.1 02/05/2016 1011   CALCIUM 8.9 12/22/2013 0530   ALKPHOS 103 02/05/2016 1011   ALKPHOS 49 12/22/2013 0530   AST 21 02/05/2016 1011   AST 49* 12/22/2013 0530   ALT 19 02/05/2016 1011   ALT 105* 12/22/2013 0530   BILITOT 0.53 02/05/2016 1011   BILITOT 0.7 12/22/2013 0530      ASSESSMENT & PLAN:  CLL (chronic lymphocytic leukemia) There is no trace of CLL in her blood today. WBC is normal I recommend observation only for now.  Hypogammaglobulinemia, acquired ([place]) Repeat immunoglobulin levels are very low, related to CLL and recent treatment. She had repeated courses of oral antibiotics and is not getting better with symptoms of significant nasal drainage and sinusitis. She is miserable. 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 The risks, benefits, side effects of treatment were fully discussed with the patient and she agreed with the plan of care. After her last IVIG infusion, she felt a little better but still has poor  energy. She denies infusion reaction. I will continue monthly infusion and see her back in 3 months for further assessment.  Thrombocytopenia ([place]) She has intermittent fluctuation of thrombocytopenia. It could be related to CLL, ITP, or bone marrow suppression from recurrent infection. She is not symptomatic apart from minor bruising. I will observe for now. I will continue to monitor CBC on the monthly basis and we will consider further evaluation if her platelet count dropped to closer to 50,000.     No orders of the defined types were placed in this encounter.   All questions were answered. The patient knows to call the clinic with any problems, questions or concerns. No barriers to learning was detected. I spent 15 minutes counseling the patient face to face. The total time spent in the appointment was 20 minutes and more than 50% was on counseling and review of test results     [facility], [place], MD 03/10/2016 10:37 AM

## 2016-03-10 NOTE — Telephone Encounter (Signed)
Pt will p/u updated sched in tx room °

## 2016-03-10 NOTE — Telephone Encounter (Signed)
Per staff message and POF I have scheduled appts. Advised scheduler of appts. JMW  

## 2016-04-02 ENCOUNTER — Other Ambulatory Visit: Payer: Self-pay | Admitting: Hematology and Oncology

## 2016-04-02 ENCOUNTER — Telehealth: Payer: Self-pay | Admitting: *Deleted

## 2016-04-02 NOTE — Telephone Encounter (Signed)
Pt instructed to take pictures- will see Dr Alvy Bimler on Monday

## 2016-04-02 NOTE — Telephone Encounter (Signed)
Pls tell her to take pictures of the rash I have placed POF to see her Monday. Please show up a bit early

## 2016-04-02 NOTE — Telephone Encounter (Signed)
Pt left message- states she received treatment on 5/8, developed "more red spots and rash" on 5/15. Saw Dr Virgina Jock on 5/23- he felt rash was related to CLL. She has been taking Benadryl 50 mg at bedtime, rash is now "drying up".   Is due for treatment on Monday, 6/5 and wants to know if she is OK to proceed.

## 2016-04-07 ENCOUNTER — Ambulatory Visit (HOSPITAL_BASED_OUTPATIENT_CLINIC_OR_DEPARTMENT_OTHER): Payer: Medicare Other

## 2016-04-07 ENCOUNTER — Other Ambulatory Visit (HOSPITAL_BASED_OUTPATIENT_CLINIC_OR_DEPARTMENT_OTHER): Payer: Medicare Other

## 2016-04-07 ENCOUNTER — Ambulatory Visit (HOSPITAL_BASED_OUTPATIENT_CLINIC_OR_DEPARTMENT_OTHER): Payer: Medicare Other | Admitting: Hematology and Oncology

## 2016-04-07 ENCOUNTER — Encounter: Payer: Self-pay | Admitting: Hematology and Oncology

## 2016-04-07 VITALS — BP 140/53 | HR 71 | Temp 97.5°F | Resp 17 | Ht 60.0 in | Wt 118.1 lb

## 2016-04-07 VITALS — BP 136/62 | HR 76 | Temp 97.1°F | Resp 17

## 2016-04-07 DIAGNOSIS — D696 Thrombocytopenia, unspecified: Secondary | ICD-10-CM

## 2016-04-07 DIAGNOSIS — D801 Nonfamilial hypogammaglobulinemia: Secondary | ICD-10-CM

## 2016-04-07 DIAGNOSIS — R21 Rash and other nonspecific skin eruption: Secondary | ICD-10-CM | POA: Insufficient documentation

## 2016-04-07 DIAGNOSIS — C9110 Chronic lymphocytic leukemia of B-cell type not having achieved remission: Secondary | ICD-10-CM

## 2016-04-07 DIAGNOSIS — H269 Unspecified cataract: Secondary | ICD-10-CM | POA: Insufficient documentation

## 2016-04-07 LAB — CBC WITH DIFFERENTIAL/PLATELET
BASO%: 2 % (ref 0.0–2.0)
Basophils Absolute: 0.1 10*3/uL (ref 0.0–0.1)
EOS%: 4.3 % (ref 0.0–7.0)
Eosinophils Absolute: 0.3 10*3/uL (ref 0.0–0.5)
HCT: 41.7 % (ref 34.8–46.6)
HGB: 13.7 g/dL (ref 11.6–15.9)
LYMPH%: 43.4 % (ref 14.0–49.7)
MCH: 29 pg (ref 25.1–34.0)
MCHC: 32.9 g/dL (ref 31.5–36.0)
MCV: 88.2 fL (ref 79.5–101.0)
MONO#: 1.7 10*3/uL — ABNORMAL HIGH (ref 0.1–0.9)
MONO%: 24.6 % — AB (ref 0.0–14.0)
NEUT%: 25.7 % — ABNORMAL LOW (ref 38.4–76.8)
NEUTROS ABS: 1.8 10*3/uL (ref 1.5–6.5)
Platelets: 94 10*3/uL — ABNORMAL LOW (ref 145–400)
RBC: 4.73 10*6/uL (ref 3.70–5.45)
RDW: 15 % — ABNORMAL HIGH (ref 11.2–14.5)
WBC: 6.9 10*3/uL (ref 3.9–10.3)
lymph#: 3 10*3/uL (ref 0.9–3.3)

## 2016-04-07 LAB — COMPREHENSIVE METABOLIC PANEL
ALT: 15 U/L (ref 0–55)
AST: 22 U/L (ref 5–34)
Albumin: 3.8 g/dL (ref 3.5–5.0)
Alkaline Phosphatase: 44 U/L (ref 40–150)
Anion Gap: 8 mEq/L (ref 3–11)
BILIRUBIN TOTAL: 0.53 mg/dL (ref 0.20–1.20)
BUN: 13.9 mg/dL (ref 7.0–26.0)
CO2: 28 meq/L (ref 22–29)
Calcium: 10.3 mg/dL (ref 8.4–10.4)
Chloride: 108 mEq/L (ref 98–109)
Creatinine: 1 mg/dL (ref 0.6–1.1)
EGFR: 53 mL/min/{1.73_m2} — AB (ref >90)
GLUCOSE: 99 mg/dL (ref 70–140)
Potassium: 5.3 mEq/L — ABNORMAL HIGH (ref 3.5–5.1)
SODIUM: 143 meq/L (ref 136–145)
TOTAL PROTEIN: 6.7 g/dL (ref 6.4–8.3)

## 2016-04-07 LAB — LACTATE DEHYDROGENASE: LDH: 154 U/L (ref 125–245)

## 2016-04-07 LAB — TECHNOLOGIST REVIEW

## 2016-04-07 MED ORDER — PREDNISONE 20 MG PO TABS: 20.0000 mg | ORAL_TABLET | Freq: Every day | ORAL | Status: DC

## 2016-04-07 MED ORDER — METHYLPREDNISOLONE SODIUM SUCC 40 MG IJ SOLR
INTRAMUSCULAR | Status: AC
  Filled 2016-04-07: qty 1

## 2016-04-07 MED ORDER — ACETAMINOPHEN 325 MG PO TABS
ORAL_TABLET | ORAL | Status: AC
  Filled 2016-04-07: qty 2

## 2016-04-07 MED ORDER — DIPHENHYDRAMINE HCL 25 MG PO CAPS
ORAL_CAPSULE | ORAL | Status: AC
  Filled 2016-04-07: qty 1

## 2016-04-07 MED ORDER — ACETAMINOPHEN 325 MG PO TABS
650.0000 mg | ORAL_TABLET | Freq: Four times a day (QID) | ORAL | Status: DC | PRN
  Administered 2016-04-07: 650 mg via ORAL

## 2016-04-07 MED ORDER — METHYLPREDNISOLONE SODIUM SUCC 40 MG IJ SOLR
40.0000 mg | Freq: Once | INTRAMUSCULAR | Status: AC
  Administered 2016-04-07: 40 mg via INTRAVENOUS

## 2016-04-07 MED ORDER — IMMUNE GLOBULIN (HUMAN) 10 GM/100ML IV SOLN
50.0000 g | Freq: Once | INTRAVENOUS | Status: AC
  Administered 2016-04-07: 50 g via INTRAVENOUS
  Filled 2016-04-07: qty 300

## 2016-04-07 MED ORDER — DIPHENHYDRAMINE HCL 25 MG PO TABS
25.0000 mg | ORAL_TABLET | Freq: Once | ORAL | Status: AC
  Administered 2016-04-07: 25 mg via ORAL
  Filled 2016-04-07: qty 1

## 2016-04-07 NOTE — Assessment & Plan Note (Signed)
The patient desired to have cataract surgery. From hematology standpoint, there is no underlying indication for her to proceed.

## 2016-04-07 NOTE — Progress Notes (Signed)
[place] OFFICE PROGRESS NOTE  Patient Care Team: Shon Baton, MD as PCP - General (Internal Medicine) Heath Lark, MD as Consulting Physician (Hematology and Oncology) Eustace Quail Tat, DO as Consulting Physician (Neurology)  SUMMARY OF ONCOLOGIC HISTORY: Oncology History   CLL stage IV, deletion 13q     CLL (chronic lymphocytic leukemia) ([place])   12/12/2013 Bone Marrow Biopsy Bone marrow biopsy show significant involvement of CLL   12/13/2013 Procedure Patient had placement of a port   12/14/2013 Imaging CT scan of the chest, abdomen and pelvis showed significant lymphadenopathy and splenomegaly   12/20/2013 - 12/22/2013 Hospital Admission The patient was admitted to the hospital to begin cycle 1 day 1 of chemotherapy due to risk of infusion reaction. She tolerated treatment well without major side effects.   12/20/2013 - 03/13/2014 Chemotherapy started on cycle 1 of Obinutuzumab, chlorambucil and prednisone. All treatment placed on hold on 03/13/2014 due to persistent pancytopenia.   03/28/2014 Imaging Repeat imaging study showed excellent response to treatment.   10/25/2014 Imaging She has persistent mild lymphadenopathy and splenomegaly, improved compared to previous exam   04/23/2015 Imaging Ct scan showed disease progression   05/09/2015 - 11/26/2015 Chemotherapy She started taking ibrutinib.   11/26/2015 Adverse Reaction Treatment  was placed on hold due to neutropenia and recent infection   02/11/2016 -  Chemotherapy She is started on monthly IVIG Rx for recurrent infection and acquired panhypogammaglobulinemia    INTERVAL HISTORY: Please see below for problem oriented charting. She returns for further follow-up. She developed skin rash after the last dose of treatment, approximately 7 days after IVIG. They are not profoundly itchy. She continues to have some sinus congestion but overall improved since restarting IVIG. She desired to have cataract surgery in the near  future.  REVIEW OF SYSTEMS:   Constitutional: Denies fevers, chills or abnormal weight loss Eyes: Denies blurriness of vision Ears, nose, mouth, throat, and face: Denies mucositis or sore throat Respiratory: Denies cough, dyspnea or wheezes Cardiovascular: Denies palpitation, chest discomfort or lower extremity swelling Gastrointestinal:  Denies nausea, heartburn or change in bowel habits Lymphatics: Denies new lymphadenopathy  Neurological:Denies numbness, tingling or new weaknesses Behavioral/Psych: Mood is stable, no new changes  All other systems were reviewed with the patient and are negative.  I have reviewed the past medical history, past surgical history, social history and family history with the patient and they are unchanged from previous note.  ALLERGIES:  is allergic to bactrim.  MEDICATIONS:  Current Outpatient Prescriptions  Medication Sig Dispense Refill  . acyclovir (ZOVIRAX) 400 MG tablet Take 1 tablet (400 mg total) by mouth daily. 90 tablet 3  . atorvastatin (LIPITOR) 20 MG tablet Take 20 mg by mouth every evening.     . calcium citrate-vitamin D (CITRACAL+D) 315-200 MG-UNIT per tablet Take 1 tablet by mouth daily.     . Cholecalciferol (VITAMIN D) 400 UNITS capsule Take 1,000 Units by mouth at bedtime.     . diphenhydrAMINE (BENADRYL) 25 mg capsule Take 25 mg by mouth at bedtime as needed for allergies (allergies).     . Echinacea 400 MG CAPS Take 1 capsule by mouth 2 (two) times daily.      Marland Kitchen ESTRACE VAGINAL 0.1 MG/GM vaginal cream Place 1 Applicatorful vaginally 2 (two) times a week.     . fluticasone (FLONASE) 50 MCG/ACT nasal spray Place 2 sprays into both nostrils daily.  1  . glucosamine-chondroitin 500-400 MG tablet Take 1 tablet by mouth daily.      Marland Kitchen  levETIRAcetam (KEPPRA XR) 500 MG 24 hr tablet Take 2 tablets (1,000 mg total) by mouth daily. 180 tablet 1  . loratadine (CLARITIN) 10 MG tablet Take 10 mg by mouth daily.      . montelukast (SINGULAIR) 10 MG  tablet Take 10 mg by mouth daily.     . Multiple Vitamin (MULTIVITAMIN) tablet Take 1 tablet by mouth daily.      . polyvinyl alcohol (ARTIFICIAL TEARS) 1.4 % ophthalmic solution Place 1 drop into both eyes 3 (three) times daily as needed for dry eyes (dry eyes). Reported on 03/10/2016    . sodium chloride (OCEAN) 0.65 % SOLN nasal spray Place 1 spray into both nostrils 2 (two) times daily.     . predniSONE (DELTASONE) 20 MG tablet Take 1 tablet (20 mg total) by mouth daily with breakfast. 30 tablet 0   No current facility-administered medications for this visit.   Facility-Administered Medications Ordered in Other Visits  Medication Dose Route Frequency Provider Last Rate Last Dose  . acetaminophen (TYLENOL) tablet 650 mg  650 mg Oral Q6H PRN Heath Lark, MD   650 mg at 04/07/16 1103    PHYSICAL EXAMINATION: ECOG PERFORMANCE STATUS: 1 - Symptomatic but completely ambulatory  Filed Vitals:   04/07/16 0928  BP: 140/53  Pulse: 71  Temp: 97.5 F (36.4 C)  Resp: 17   Filed Weights   04/07/16 0928  Weight: 118 lb 1.6 oz (53.57 kg)    GENERAL:alert, no distress and comfortable SKIN: She is noted to have diffuse body rashes. No petechiae. Some of the rash resembled hives EYES: normal, Conjunctiva are pink and non-injected, sclera clear Musculoskeletal:no cyanosis of digits and no clubbing  NEURO: alert & oriented x 3 with fluent speech, no focal motor/sensory deficits  LABORATORY DATA:  I have reviewed the data as listed    Component Value Date/Time   NA 143 04/07/2016 0915   NA 141 12/22/2013 0530   K 5.3* 04/07/2016 0915   K 4.0 12/22/2013 0530   CL 107 12/22/2013 0530   CL 99 04/08/2013 0922   CO2 28 04/07/2016 0915   CO2 22 12/22/2013 0530   GLUCOSE 99 04/07/2016 0915   GLUCOSE 115* 12/22/2013 0530   GLUCOSE 92 04/08/2013 0922   BUN 13.9 04/07/2016 0915   BUN 26* 12/22/2013 0530   CREATININE 1.0 04/07/2016 0915   CREATININE 0.87 12/22/2013 0530   CALCIUM 10.3  04/07/2016 0915   CALCIUM 8.9 12/22/2013 0530   PROT 6.7 04/07/2016 0915   PROT 5.1* 12/22/2013 0530   ALBUMIN 3.8 04/07/2016 0915   ALBUMIN 3.3* 12/22/2013 0530   AST 22 04/07/2016 0915   AST 49* 12/22/2013 0530   ALT 15 04/07/2016 0915   ALT 105* 12/22/2013 0530   ALKPHOS 44 04/07/2016 0915   ALKPHOS 49 12/22/2013 0530   BILITOT 0.53 04/07/2016 0915   BILITOT 0.7 12/22/2013 0530   GFRNONAA 62* 12/22/2013 0530   GFRAA 72* 12/22/2013 0530    No results found for: SPEP, UPEP  Lab Results  Component Value Date   WBC 6.9 04/07/2016   NEUTROABS 1.8 04/07/2016   HGB 13.7 04/07/2016   HCT 41.7 04/07/2016   MCV 88.2 04/07/2016   PLT 94* 04/07/2016      Chemistry      Component Value Date/Time   NA 143 04/07/2016 0915   NA 141 12/22/2013 0530   K 5.3* 04/07/2016 0915   K 4.0 12/22/2013 0530   CL 107 12/22/2013 0530   CL  99 04/08/2013 0922   CO2 28 04/07/2016 0915   CO2 22 12/22/2013 0530   BUN 13.9 04/07/2016 0915   BUN 26* 12/22/2013 0530   CREATININE 1.0 04/07/2016 0915   CREATININE 0.87 12/22/2013 0530      Component Value Date/Time   CALCIUM 10.3 04/07/2016 0915   CALCIUM 8.9 12/22/2013 0530   ALKPHOS 44 04/07/2016 0915   ALKPHOS 49 12/22/2013 0530   AST 22 04/07/2016 0915   AST 49* 12/22/2013 0530   ALT 15 04/07/2016 0915   ALT 105* 12/22/2013 0530   BILITOT 0.53 04/07/2016 0915   BILITOT 0.7 12/22/2013 0530      ASSESSMENT & PLAN:  CLL (chronic lymphocytic leukemia) There is no trace of CLL in her blood today. WBC is normal I recommend observation only for now.  Thrombocytopenia ([place]) She has intermittent fluctuation of thrombocytopenia. It could be related to CLL, ITP, or bone marrow suppression from recurrent infection. She is not symptomatic apart from minor bruising. I will observe for now. I will continue to monitor CBC on the monthly basis and we will consider further evaluation if her platelet count dropped to closer to 50,000. Her CBC show  improvement of platelet count today.   Hypogammaglobulinemia, acquired ([place]) Repeat immunoglobulin levels are very low, related to CLL and recent treatment. She had repeated courses of oral antibiotics and is not getting better with symptoms of significant nasal drainage and sinusitis. She is miserable. 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 The risks, benefits, side effects of treatment were fully discussed with the patient and she agreed with the plan of care. Since we started her on IVIG treatment, overall she has improvement. I plan to proceed with treatment today and repeat immunoglobulin level prior to her next treatment.  Skin rash She has skin rash of unknown etiology. Today the rash looked like hives. Could be related to reaction to Octagam. Recommend a trial of low-dose prednisone between 10-20 mg daily. I would give her Solu-Medrol before IVIG. I plan to switch her from Octagam to Privigen in the future for IVIG treatment and see if she would tolerate that better.  Bilateral cataracts The patient desired to have cataract surgery. From hematology standpoint, there is no underlying indication for her to proceed.    Orders Placed This Encounter  Procedures  . CBC with Differential/Platelet    Standing Status: Future     Number of Occurrences: 1     Standing Expiration Date: 05/12/2017  . Comprehensive metabolic panel    Standing Status: Future     Number of Occurrences: 1     Standing Expiration Date: 05/12/2017  . Lactate dehydrogenase (LDH)    Standing Status: Future     Number of Occurrences: 1     Standing Expiration Date: 04/07/2017  . CBC with Differential/Platelet    Standing Status: Future     Number of Occurrences:      Standing Expiration Date: 05/12/2017  . Comprehensive metabolic panel    Standing Status: Future     Number of Occurrences:      Standing Expiration Date: 05/12/2017  . IgG, IgA, IgM    Standing Status:  Future     Number of Occurrences:      Standing Expiration Date: 05/12/2017   All questions were answered. The patient knows to call the clinic with any problems, questions or concerns. No barriers to learning was detected. I spent 20 minutes counseling the patient face to face. The total  time spent in the appointment was 30 minutes and more than 50% was on counseling and review of test results     [facility], [place], MD 04/07/2016 3:04 PM

## 2016-04-07 NOTE — Assessment & Plan Note (Signed)
She has skin rash of unknown etiology. Today the rash looked like hives. Could be related to reaction to Octagam. Recommend a trial of low-dose prednisone between 10-20 mg daily. I would give her Solu-Medrol before IVIG. I plan to switch her from Octagam to Privigen in the future for IVIG treatment and see if she would tolerate that better.

## 2016-04-07 NOTE — Patient Instructions (Signed)
Immune Globulin Injection: Privigen What is this medicine? IMMUNE GLOBULIN (im MUNE GLOB yoo lin) helps to prevent or reduce the severity of certain infections in patients who are at risk. This medicine is collected from the pooled blood of many donors. It is used to treat immune system problems, thrombocytopenia, and Kawasaki syndrome. This medicine may be used for other purposes; ask your health care provider or pharmacist if you have questions. What should I tell my health care provider before I take this medicine? They need to know if you have any of these conditions: - diabetes - extremely low or no immune antibodies in the blood - heart disease - history of blood clots - hyperprolinemia - infection in the blood, sepsis - kidney disease - taking medicine that may change kidney function - ask your health care provider about your medicine - an unusual or allergic reaction to human immune globulin, albumin, maltose, sucrose, polysorbate 80, other medicines, foods, dyes, or preservatives - pregnant or trying to get pregnant - breast-feeding How should I use this medicine? This medicine is for injection into a muscle or infusion into a vein or skin. It is usually given by a health care professional in a hospital or clinic setting. In rare cases, some brands of this medicine might be given at home. You will be taught how to give this medicine. Use exactly as directed. Take your medicine at regular intervals. Do not take your medicine more often than directed. Talk to your pediatrician regarding the use of this medicine in children. Special care may be needed. Overdosage: If you think you have taken too much of this medicine contact a poison control center or emergency room at once. NOTE: This medicine is only for you. Do not share this medicine with others. What if I miss a dose? It is important not to miss your dose. Call your doctor or health care professional if you are unable to  keep an appointment. If you give yourself the medicine and you miss a dose, take it as soon as you can. If it is almost time for your next dose, take only that dose. Do not take double or extra doses. What may interact with this medicine? -aspirin and aspirin-like medicines -cisplatin -cyclosporine -medicines for infection like acyclovir, adefovir, amphotericin B, bacitracin, cidofovir, foscarnet, ganciclovir, gentamicin, pentamidine, vancomycin -NSAIDS, medicines for pain and inflammation, like ibuprofen or naproxen -pamidronate -vaccines -zoledronic acid This list may not describe all possible interactions. Give your health care provider a list of all the medicines, herbs, non-prescription drugs, or dietary supplements you use. Also tell them if you smoke, drink alcohol, or use illegal drugs. Some items may interact with your medicine. What should I watch for while using this medicine? Your condition will be monitored carefully while you are receiving this medicine. This medicine is made from pooled blood donations of many different people. It may be possible to pass an infection in this medicine. However, the donors are screened for infections and all products are tested for HIV and hepatitis. The medicine is treated to kill most or all bacteria and viruses. Talk to your doctor about the risks and benefits of this medicine. Do not have vaccinations for at least 14 days before, or until at least 3 months after receiving this medicine. What side effects may I notice from receiving this medicine? Side effects that you should report to your doctor or health care professional as soon as possible: -allergic reactions like skin rash, itching or hives, swelling  of the face, lips, or tongue -breathing problems -chest pain or tightness -fever, chills -headache with nausea, vomiting -neck pain or difficulty moving neck -pain when moving eyes -pain, swelling, warmth in the leg -problems with balance,  talking, walking -sudden weight gain -swelling of the ankles, feet, hands -trouble passing urine or change in the amount of urine Side effects that usually do not require medical attention (report to your doctor or health care professional if they continue or are bothersome): -dizzy, drowsy -flushing -increased sweating -leg cramps -muscle aches and pains -pain at site where injected This list may not describe all possible side effects. Call your doctor for medical advice about side effects. You may report side effects to FDA at 1-800-FDA-1088. Where should I keep my medicine? Keep out of the reach of children. This drug is usually given in a hospital or clinic and will not be stored at home. In rare cases, some brands of this medicine may be given at home. If you are using this medicine at home, you will be instructed on how to store this medicine. Throw away any unused medicine after the expiration date on the label. NOTE: This sheet is a summary. It may not cover all possible information. If you have questions about this medicine, talk to your doctor, pharmacist, or health care provider.    2016, Elsevier/Gold Standard. (2009-01-10 11:44:49)

## 2016-04-07 NOTE — Assessment & Plan Note (Signed)
She has intermittent fluctuation of thrombocytopenia. It could be related to CLL, ITP, or bone marrow suppression from recurrent infection. She is not symptomatic apart from minor bruising. I will observe for now. I will continue to monitor CBC on the monthly basis and we will consider further evaluation if her platelet count dropped to closer to 50,000. Her CBC show improvement of platelet count today.

## 2016-04-07 NOTE — Assessment & Plan Note (Signed)
Repeat immunoglobulin levels are very low, related to CLL and recent treatment. She had repeated courses of oral antibiotics and is not getting better with symptoms of significant nasal drainage and sinusitis. She is miserable. 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 The risks, benefits, side effects of treatment were fully discussed with the patient and she agreed with the plan of care. Since we started her on IVIG treatment, overall she has improvement. I plan to proceed with treatment today and repeat immunoglobulin level prior to her next treatment.

## 2016-04-07 NOTE — Assessment & Plan Note (Signed)
There is no trace of CLL in her blood today. WBC is normal I recommend observation only for now.

## 2016-04-08 ENCOUNTER — Telehealth: Payer: Self-pay | Admitting: Neurology

## 2016-04-08 MED ORDER — LEVETIRACETAM ER 500 MG PO TB24: 1000.0000 mg | ORAL_TABLET | Freq: Every day | ORAL | Status: DC

## 2016-04-08 NOTE — Telephone Encounter (Signed)
Spoke with patient's husband- she needs refill of Keppra sent to [facility]. Refill sent. They will call with any other questions.

## 2016-04-08 NOTE — Telephone Encounter (Signed)
VM-PT's husband Mariea Clonts left a message about a medication refill/Dawn CB# [phone]

## 2016-05-01 ENCOUNTER — Other Ambulatory Visit: Payer: Self-pay | Admitting: Hematology and Oncology

## 2016-05-01 ENCOUNTER — Telehealth: Payer: Self-pay | Admitting: *Deleted

## 2016-05-01 NOTE — Telephone Encounter (Signed)
Pt left VM reporting rash on legs getting worse.  She resumed taking prednisone 20 mg yesterday.  She is scheduled for IVIG on Monday 7/3.  Informed Dr. Alvy Bimler of above and she instructs for pt to continue to prednisone 20 mg daily until she sees her on Monday.  Take picture of rash today.  Dr. Alvy Bimler will see pt on Monday prior to treatment.  Called pt back and left her a VM instructing her on taking a picture, continue prednisone and Dr. Alvy Bimler will see her Monday.  Asked pt to call back if she needs refill on prednisone or any other questions.

## 2016-05-01 NOTE — Telephone Encounter (Signed)
err

## 2016-05-05 ENCOUNTER — Other Ambulatory Visit: Payer: Self-pay | Admitting: Hematology and Oncology

## 2016-05-05 ENCOUNTER — Other Ambulatory Visit (HOSPITAL_BASED_OUTPATIENT_CLINIC_OR_DEPARTMENT_OTHER): Payer: Medicare Other

## 2016-05-05 ENCOUNTER — Encounter: Payer: Self-pay | Admitting: Hematology and Oncology

## 2016-05-05 ENCOUNTER — Ambulatory Visit (HOSPITAL_BASED_OUTPATIENT_CLINIC_OR_DEPARTMENT_OTHER): Payer: Medicare Other | Admitting: Hematology and Oncology

## 2016-05-05 ENCOUNTER — Ambulatory Visit (HOSPITAL_BASED_OUTPATIENT_CLINIC_OR_DEPARTMENT_OTHER): Payer: Medicare Other

## 2016-05-05 VITALS — BP 130/65 | HR 71 | Temp 98.3°F | Resp 18

## 2016-05-05 VITALS — BP 148/79 | HR 73 | Temp 97.8°F | Resp 17 | Wt 117.0 lb

## 2016-05-05 DIAGNOSIS — C9110 Chronic lymphocytic leukemia of B-cell type not having achieved remission: Secondary | ICD-10-CM

## 2016-05-05 DIAGNOSIS — R21 Rash and other nonspecific skin eruption: Secondary | ICD-10-CM | POA: Diagnosis not present

## 2016-05-05 DIAGNOSIS — H269 Unspecified cataract: Secondary | ICD-10-CM

## 2016-05-05 DIAGNOSIS — D696 Thrombocytopenia, unspecified: Secondary | ICD-10-CM

## 2016-05-05 DIAGNOSIS — D801 Nonfamilial hypogammaglobulinemia: Secondary | ICD-10-CM

## 2016-05-05 LAB — COMPREHENSIVE METABOLIC PANEL
ALK PHOS: 43 U/L (ref 40–150)
ALT: 19 U/L (ref 0–55)
AST: 18 U/L (ref 5–34)
Albumin: 3.9 g/dL (ref 3.5–5.0)
Anion Gap: 10 mEq/L (ref 3–11)
BILIRUBIN TOTAL: 0.54 mg/dL (ref 0.20–1.20)
BUN: 12.6 mg/dL (ref 7.0–26.0)
CO2: 25 meq/L (ref 22–29)
Calcium: 10 mg/dL (ref 8.4–10.4)
Chloride: 105 mEq/L (ref 98–109)
Creatinine: 0.9 mg/dL (ref 0.6–1.1)
EGFR: 57 mL/min/{1.73_m2} — ABNORMAL LOW (ref >90)
GLUCOSE: 101 mg/dL (ref 70–140)
Potassium: 3.6 mEq/L (ref 3.5–5.1)
SODIUM: 140 meq/L (ref 136–145)
TOTAL PROTEIN: 6.8 g/dL (ref 6.4–8.3)

## 2016-05-05 LAB — CBC WITH DIFFERENTIAL/PLATELET
BASO%: 0.7 % (ref 0.0–2.0)
Basophils Absolute: 0.1 10*3/uL (ref 0.0–0.1)
EOS ABS: 0.2 10*3/uL (ref 0.0–0.5)
EOS%: 2 % (ref 0.0–7.0)
HCT: 42.1 % (ref 34.8–46.6)
HEMOGLOBIN: 13.8 g/dL (ref 11.6–15.9)
LYMPH%: 49.5 % (ref 14.0–49.7)
MCH: 29.2 pg (ref 25.1–34.0)
MCHC: 32.7 g/dL (ref 31.5–36.0)
MCV: 89.3 fL (ref 79.5–101.0)
MONO#: 0.6 10*3/uL (ref 0.1–0.9)
MONO%: 8.1 % (ref 0.0–14.0)
NEUT%: 39.7 % (ref 38.4–76.8)
NEUTROS ABS: 3.1 10*3/uL (ref 1.5–6.5)
Platelets: 91 10*3/uL — ABNORMAL LOW (ref 145–400)
RBC: 4.71 10*6/uL (ref 3.70–5.45)
RDW: 15.5 % — AB (ref 11.2–14.5)
WBC: 7.8 10*3/uL (ref 3.9–10.3)
lymph#: 3.9 10*3/uL — ABNORMAL HIGH (ref 0.9–3.3)

## 2016-05-05 LAB — TECHNOLOGIST REVIEW

## 2016-05-05 MED ORDER — DIPHENHYDRAMINE HCL 25 MG PO TABS
25.0000 mg | ORAL_TABLET | Freq: Once | ORAL | Status: AC
  Administered 2016-05-05: 25 mg via ORAL
  Filled 2016-05-05: qty 1

## 2016-05-05 MED ORDER — METHYLPREDNISOLONE SODIUM SUCC 40 MG IJ SOLR
40.0000 mg | Freq: Once | INTRAMUSCULAR | Status: AC
  Administered 2016-05-05: 40 mg via INTRAVENOUS

## 2016-05-05 MED ORDER — METHYLPREDNISOLONE SODIUM SUCC 40 MG IJ SOLR
INTRAMUSCULAR | Status: AC
  Filled 2016-05-05: qty 1

## 2016-05-05 MED ORDER — IMMUNE GLOBULIN (HUMAN) 10 GM/100ML IV SOLN
0.9000 g/kg | Freq: Once | INTRAVENOUS | Status: AC
  Administered 2016-05-05: 50 g via INTRAVENOUS
  Filled 2016-05-05: qty 300

## 2016-05-05 MED ORDER — DIPHENHYDRAMINE HCL 25 MG PO CAPS
ORAL_CAPSULE | ORAL | Status: AC
Start: 2016-05-05 — End: 2016-05-05
  Filled 2016-05-05: qty 1

## 2016-05-05 MED ORDER — ACETAMINOPHEN 325 MG PO TABS
ORAL_TABLET | ORAL | Status: AC
  Filled 2016-05-05: qty 2

## 2016-05-05 MED ORDER — ACETAMINOPHEN 325 MG PO TABS
650.0000 mg | ORAL_TABLET | Freq: Four times a day (QID) | ORAL | Status: DC | PRN
  Administered 2016-05-05: 650 mg via ORAL

## 2016-05-05 NOTE — Patient Instructions (Signed)

## 2016-05-05 NOTE — Assessment & Plan Note (Addendum)
She has persistent skin rash. There were evidence of actinic keratosis, dry skin with possible flare of CLL. Leukocytoclastic vasculitis related to CLL cannot be excluded. She responded well with short course prednisone. I recommend discontinuation of prednisone but to continue topical emollient cream

## 2016-05-05 NOTE — Assessment & Plan Note (Signed)
The patient desire for cataract surgery. With the stability of her platelet count, there is no contraindication for her to proceed with surgery

## 2016-05-05 NOTE — Progress Notes (Signed)
Rcvd call from [place] (Dr. Calton Dach nurse) inquiring about copay assistance w/ [employer] for IVIG since pt has a grant w/ them.  I informed her typically there isn't copay assistance available for IVIG but I would call [employer] to see if they will cover it however, [facility] is closed today & 05/06/16 due to the holiday so I will call on 05/07/16.  I also researched [employer], [facility] [employer] but these foundations don't have funding available or does not offer copay assistance for this treatment.  I will contact the pt once I talk to [facility].

## 2016-05-05 NOTE — Assessment & Plan Note (Signed)
There is evidence of mild progressive leukocytosis, worrisome for recurrence I recommend observation only for now because of her other issues

## 2016-05-05 NOTE — Assessment & Plan Note (Signed)
She has intermittent fluctuation of thrombocytopenia. It could be related to CLL, ITP, or bone marrow suppression from recurrent infection. She is not symptomatic apart from minor bruising. I will observe for now. I will continue to monitor CBC on the monthly basis and we will consider further evaluation if her platelet count dropped to closer to 50,000. Her CBC is stable today.

## 2016-05-05 NOTE — Assessment & Plan Note (Signed)
Repeat immunoglobulin levels are very low, related to CLL and recent treatment. She had repeated courses of oral antibiotics and is not getting better with symptoms of significant nasal drainage and sinusitis. Since we started her on IVIG treatment, overall she has improvement. I plan to proceed with treatment today and maintenance therapy every 4 weeks

## 2016-05-05 NOTE — Progress Notes (Signed)
[place] OFFICE PROGRESS NOTE  Patient Care Team: Shon Baton, MD as PCP - General (Internal Medicine) Heath Lark, MD as Consulting Physician (Hematology and Oncology) Eustace Quail Tat, DO as Consulting Physician (Neurology)  SUMMARY OF ONCOLOGIC HISTORY: Oncology History   CLL stage IV, deletion 13q     CLL (chronic lymphocytic leukemia) ([place])   12/12/2013 Bone Marrow Biopsy Bone marrow biopsy show significant involvement of CLL   12/13/2013 Procedure Patient had placement of a port   12/14/2013 Imaging CT scan of the chest, abdomen and pelvis showed significant lymphadenopathy and splenomegaly   12/20/2013 - 12/22/2013 Hospital Admission The patient was admitted to the hospital to begin cycle 1 day 1 of chemotherapy due to risk of infusion reaction. She tolerated treatment well without major side effects.   12/20/2013 - 03/13/2014 Chemotherapy started on cycle 1 of Obinutuzumab, chlorambucil and prednisone. All treatment placed on hold on 03/13/2014 due to persistent pancytopenia.   03/28/2014 Imaging Repeat imaging study showed excellent response to treatment.   10/25/2014 Imaging She has persistent mild lymphadenopathy and splenomegaly, improved compared to previous exam   04/23/2015 Imaging Ct scan showed disease progression   05/09/2015 - 11/26/2015 Chemotherapy She started taking ibrutinib.   11/26/2015 Adverse Reaction Treatment  was placed on hold due to neutropenia and recent infection   02/11/2016 -  Chemotherapy She is started on monthly IVIG Rx for recurrent infection and acquired panhypogammaglobulinemia    INTERVAL HISTORY: Please see below for problem oriented charting. She complained of recent skin rashes and itching. She was started on 20 mg prednisone therapy over the weekend with improvement. She continues to have persistent nasal drainage but no recent infection. The patient denies any recent signs or symptoms of bleeding such as spontaneous epistaxis, hematuria or  hematochezia. She denies side effects from IVIG  REVIEW OF SYSTEMS:   Constitutional: Denies fevers, chills or abnormal weight loss Eyes: Denies blurriness of vision Ears, nose, mouth, throat, and face: Denies mucositis or sore throat Respiratory: Denies cough, dyspnea or wheezes Cardiovascular: Denies palpitation, chest discomfort or lower extremity swelling Gastrointestinal:  Denies nausea, heartburn or change in bowel habits Lymphatics: Denies new lymphadenopathy or easy bruising Neurological:Denies numbness, tingling or new weaknesses Behavioral/Psych: Mood is stable, no new changes  All other systems were reviewed with the patient and are negative.  I have reviewed the past medical history, past surgical history, social history and family history with the patient and they are unchanged from previous note.  ALLERGIES:  is allergic to bactrim.  MEDICATIONS:  Current Outpatient Prescriptions  Medication Sig Dispense Refill  . acyclovir (ZOVIRAX) 400 MG tablet Take 1 tablet (400 mg total) by mouth daily. 90 tablet 3  . atorvastatin (LIPITOR) 20 MG tablet Take 20 mg by mouth every evening.     . calcium citrate-vitamin D (CITRACAL+D) 315-200 MG-UNIT per tablet Take 1 tablet by mouth daily.     . Cholecalciferol (VITAMIN D) 400 UNITS capsule Take 1,000 Units by mouth at bedtime.     . diphenhydrAMINE (BENADRYL) 25 mg capsule Take 25 mg by mouth at bedtime as needed for allergies (allergies).     . Echinacea 400 MG CAPS Take 1 capsule by mouth 2 (two) times daily.      Marland Kitchen ESTRACE VAGINAL 0.1 MG/GM vaginal cream Place 1 Applicatorful vaginally 2 (two) times a week.     . fluticasone (FLONASE) 50 MCG/ACT nasal spray Place 2 sprays into both nostrils daily.  1  . glucosamine-chondroitin 500-400  MG tablet Take 1 tablet by mouth daily.      Marland Kitchen levETIRAcetam (KEPPRA XR) 500 MG 24 hr tablet Take 2 tablets (1,000 mg total) by mouth daily. 180 tablet 1  . loratadine (CLARITIN) 10 MG tablet Take 10  mg by mouth daily.      . montelukast (SINGULAIR) 10 MG tablet Take 10 mg by mouth daily.     . Multiple Vitamin (MULTIVITAMIN) tablet Take 1 tablet by mouth daily.      . polyvinyl alcohol (ARTIFICIAL TEARS) 1.4 % ophthalmic solution Place 1 drop into both eyes 3 (three) times daily as needed for dry eyes (dry eyes). Reported on 03/10/2016    . predniSONE (DELTASONE) 20 MG tablet Take 1 tablet (20 mg total) by mouth daily with breakfast. 30 tablet 0  . sodium chloride (OCEAN) 0.65 % SOLN nasal spray Place 1 spray into both nostrils 2 (two) times daily.      No current facility-administered medications for this visit.   Facility-Administered Medications Ordered in Other Visits  Medication Dose Route Frequency Provider Last Rate Last Dose  . acetaminophen (TYLENOL) tablet 650 mg  650 mg Oral Q6H PRN Heath Lark, MD   650 mg at 05/05/16 1016    PHYSICAL EXAMINATION: ECOG PERFORMANCE STATUS: 1 - Symptomatic but completely ambulatory  Filed Vitals:   05/05/16 0909  BP: 148/79  Pulse: 73  Temp: 97.8 F (36.6 C)  Resp: 17   Filed Weights   05/05/16 0909  Weight: 117 lb (53.071 kg)    GENERAL:alert, no distress and comfortable SKIN: Noted actinic keratosis and extensive bruises. No petechiae EYES: normal, Conjunctiva are pink and non-injected, sclera clear OROPHARYNX:no exudate, no erythema and lips, buccal mucosa, and tongue normal  Musculoskeletal:no cyanosis of digits and no clubbing  NEURO: alert & oriented x 3 with fluent speech, no focal motor/sensory deficits  LABORATORY DATA:  I have reviewed the data as listed    Component Value Date/Time   NA 140 05/05/2016 0850   NA 141 12/22/2013 0530   K 3.6 05/05/2016 0850   K 4.0 12/22/2013 0530   CL 107 12/22/2013 0530   CL 99 04/08/2013 0922   CO2 25 05/05/2016 0850   CO2 22 12/22/2013 0530   GLUCOSE 101 05/05/2016 0850   GLUCOSE 115* 12/22/2013 0530   GLUCOSE 92 04/08/2013 0922   BUN 12.6 05/05/2016 0850   BUN 26*  12/22/2013 0530   CREATININE 0.9 05/05/2016 0850   CREATININE 0.87 12/22/2013 0530   CALCIUM 10.0 05/05/2016 0850   CALCIUM 8.9 12/22/2013 0530   PROT 6.8 05/05/2016 0850   PROT 5.1* 12/22/2013 0530   ALBUMIN 3.9 05/05/2016 0850   ALBUMIN 3.3* 12/22/2013 0530   AST 18 05/05/2016 0850   AST 49* 12/22/2013 0530   ALT 19 05/05/2016 0850   ALT 105* 12/22/2013 0530   ALKPHOS 43 05/05/2016 0850   ALKPHOS 49 12/22/2013 0530   BILITOT 0.54 05/05/2016 0850   BILITOT 0.7 12/22/2013 0530   GFRNONAA 62* 12/22/2013 0530   GFRAA 72* 12/22/2013 0530    No results found for: SPEP, UPEP  Lab Results  Component Value Date   WBC 7.8 05/05/2016   NEUTROABS 3.1 05/05/2016   HGB 13.8 05/05/2016   HCT 42.1 05/05/2016   MCV 89.3 05/05/2016   PLT 91* 05/05/2016      Chemistry      Component Value Date/Time   NA 140 05/05/2016 0850   NA 141 12/22/2013 0530   K 3.6 05/05/2016  0850   K 4.0 12/22/2013 0530   CL 107 12/22/2013 0530   CL 99 04/08/2013 0922   CO2 25 05/05/2016 0850   CO2 22 12/22/2013 0530   BUN 12.6 05/05/2016 0850   BUN 26* 12/22/2013 0530   CREATININE 0.9 05/05/2016 0850   CREATININE 0.87 12/22/2013 0530      Component Value Date/Time   CALCIUM 10.0 05/05/2016 0850   CALCIUM 8.9 12/22/2013 0530   ALKPHOS 43 05/05/2016 0850   ALKPHOS 49 12/22/2013 0530   AST 18 05/05/2016 0850   AST 49* 12/22/2013 0530   ALT 19 05/05/2016 0850   ALT 105* 12/22/2013 0530   BILITOT 0.54 05/05/2016 0850   BILITOT 0.7 12/22/2013 0530      ASSESSMENT & PLAN:  CLL (chronic lymphocytic leukemia) There is evidence of mild progressive leukocytosis, worrisome for recurrence I recommend observation only for now because of her other issues    Thrombocytopenia ([place]) She has intermittent fluctuation of thrombocytopenia. It could be related to CLL, ITP, or bone marrow suppression from recurrent infection. She is not symptomatic apart from minor bruising. I will observe for now. I will  continue to monitor CBC on the monthly basis and we will consider further evaluation if her platelet count dropped to closer to 50,000. Her CBC is stable today.   Skin rash She has persistent skin rash. There were evidence of actinic keratosis, dry skin with possible flare of CLL. Leukocytoclastic vasculitis related to CLL cannot be excluded. She responded well with short course prednisone. I recommend discontinuation of prednisone but to continue topical emollient cream  Bilateral cataracts The patient desire for cataract surgery. With the stability of her platelet count, there is no contraindication for her to proceed with surgery  Hypogammaglobulinemia, acquired ([place]) Repeat immunoglobulin levels are very low, related to CLL and recent treatment. She had repeated courses of oral antibiotics and is not getting better with symptoms of significant nasal drainage and sinusitis. Since we started her on IVIG treatment, overall she has improvement. I plan to proceed with treatment today and maintenance therapy every 4 weeks   Orders Placed This Encounter  Procedures  . IR Fluoro Guide CV Line Right    For port. Non urgent. Patient specifically requested Dr. Vernard Gambles to do it    Standing Status: Future     Number of Occurrences:      Standing Expiration Date: 07/06/2017    Order Specific Question:  Reason for exam:    Answer:  need port for infusions    Order Specific Question:  Preferred Imaging Location?    Answer:  [facility]  . CBC with Differential/Platelet    Standing Status: Standing     Number of Occurrences: 9     Standing Expiration Date: 05/05/2017  . Comprehensive metabolic panel    Standing Status: Standing     Number of Occurrences: 9     Standing Expiration Date: 05/05/2017   All questions were answered. The patient knows to call the clinic with any problems, questions or concerns. No barriers to learning was detected. I spent 20 minutes counseling the patient  face to face. The total time spent in the appointment was 30 minutes and more than 50% was on counseling and review of test results     [facility], Hubert Raatz, MD 05/05/2016 1:23 PM

## 2016-05-06 LAB — IGG, IGA, IGM
IgA, Qn, Serum: 16 mg/dL — ABNORMAL LOW (ref 64–422)
IgG, Qn, Serum: 1087 mg/dL (ref 700–1600)
IgM, Qn, Serum: 7 mg/dL — ABNORMAL LOW (ref 26–217)

## 2016-05-08 ENCOUNTER — Encounter: Payer: Self-pay | Admitting: Hematology and Oncology

## 2016-05-08 NOTE — Progress Notes (Signed)
Spoke w/ [employer] about pt's grant covering her IVIG treatment.  The rep told me a list of things they do cover and I don't think IVIG was one of them but she did advise to submit the claim anyway and if it's not covered they will notify us and the pt.  I relayed this info to Tammy (Dr. Calton Dach nurse) and I will let the pt know as well.

## 2016-05-09 ENCOUNTER — Ambulatory Visit: Payer: Medicare Other | Admitting: Neurology

## 2016-05-12 ENCOUNTER — Ambulatory Visit: Payer: Medicare Other | Admitting: Hematology and Oncology

## 2016-05-12 ENCOUNTER — Other Ambulatory Visit: Payer: Self-pay | Admitting: Radiology

## 2016-05-12 ENCOUNTER — Encounter: Payer: Self-pay | Admitting: Hematology and Oncology

## 2016-05-12 NOTE — Progress Notes (Signed)
Pt will bring Shauna her approval letter & POE from [employer] on 05/13/16 to submit to billing.

## 2016-05-13 ENCOUNTER — Encounter: Payer: Self-pay | Admitting: Hematology and Oncology

## 2016-05-13 ENCOUNTER — Ambulatory Visit (HOSPITAL_COMMUNITY)
Admission: RE | Admit: 2016-05-13 | Discharge: 2016-05-13 | Disposition: A | Discharge status: Home / Self Care | Payer: Medicare Other | Source: Ambulatory Visit | Attending: Hematology and Oncology | Admitting: Hematology and Oncology

## 2016-05-13 ENCOUNTER — Encounter (HOSPITAL_COMMUNITY): Payer: Self-pay

## 2016-05-13 ENCOUNTER — Other Ambulatory Visit: Payer: Self-pay | Admitting: Hematology and Oncology

## 2016-05-13 DIAGNOSIS — D801 Nonfamilial hypogammaglobulinemia: Secondary | ICD-10-CM

## 2016-05-13 DIAGNOSIS — Z8673 Personal history of transient ischemic attack (TIA), and cerebral infarction without residual deficits: Secondary | ICD-10-CM | POA: Diagnosis not present

## 2016-05-13 DIAGNOSIS — K589 Irritable bowel syndrome without diarrhea: Secondary | ICD-10-CM | POA: Insufficient documentation

## 2016-05-13 DIAGNOSIS — M810 Age-related osteoporosis without current pathological fracture: Secondary | ICD-10-CM | POA: Diagnosis not present

## 2016-05-13 DIAGNOSIS — E785 Hyperlipidemia, unspecified: Secondary | ICD-10-CM | POA: Diagnosis not present

## 2016-05-13 DIAGNOSIS — K219 Gastro-esophageal reflux disease without esophagitis: Secondary | ICD-10-CM | POA: Insufficient documentation

## 2016-05-13 DIAGNOSIS — D696 Thrombocytopenia, unspecified: Secondary | ICD-10-CM | POA: Diagnosis not present

## 2016-05-13 DIAGNOSIS — C9110 Chronic lymphocytic leukemia of B-cell type not having achieved remission: Secondary | ICD-10-CM

## 2016-05-13 DIAGNOSIS — Z7952 Long term (current) use of systemic steroids: Secondary | ICD-10-CM | POA: Diagnosis not present

## 2016-05-13 DIAGNOSIS — I10 Essential (primary) hypertension: Secondary | ICD-10-CM | POA: Diagnosis not present

## 2016-05-13 DIAGNOSIS — Z9221 Personal history of antineoplastic chemotherapy: Secondary | ICD-10-CM | POA: Insufficient documentation

## 2016-05-13 LAB — BASIC METABOLIC PANEL
Anion gap: 7 (ref 5–15)
BUN: 12 mg/dL (ref 6–20)
CALCIUM: 9.6 mg/dL (ref 8.9–10.3)
CO2: 23 mmol/L (ref 22–32)
CREATININE: 0.97 mg/dL (ref 0.44–1.00)
Chloride: 109 mmol/L (ref 101–111)
GFR calc Af Amer: >60 mL/min (ref >60)
GFR, EST NON AFRICAN AMERICAN: 53 mL/min — AB (ref >60)
GLUCOSE: 88 mg/dL (ref 65–99)
POTASSIUM: 4.6 mmol/L (ref 3.5–5.1)
SODIUM: 139 mmol/L (ref 135–145)

## 2016-05-13 LAB — PROTIME-INR
INR: 1.03 (ref 0.00–1.49)
PROTHROMBIN TIME: 13.3 s (ref 11.6–15.2)

## 2016-05-13 LAB — CBC
HEMATOCRIT: 41.8 % (ref 36.0–46.0)
Hemoglobin: 13.9 g/dL (ref 12.0–15.0)
MCH: 30 pg (ref 26.0–34.0)
MCHC: 33.3 g/dL (ref 30.0–36.0)
MCV: 90.1 fL (ref 78.0–100.0)
PLATELETS: 92 10*3/uL — AB (ref 150–400)
RBC: 4.64 MIL/uL (ref 3.87–5.11)
RDW: 14.8 % (ref 11.5–15.5)
WBC: 7.2 10*3/uL (ref 4.0–10.5)

## 2016-05-13 LAB — APTT: APTT: 26 s (ref 24–37)

## 2016-05-13 MED ORDER — HEPARIN SOD (PORK) LOCK FLUSH 100 UNIT/ML IV SOLN
INTRAVENOUS | Status: AC
  Filled 2016-05-13: qty 5

## 2016-05-13 MED ORDER — CEFAZOLIN SODIUM-DEXTROSE 2-4 GM/100ML-% IV SOLN
2.0000 g | INTRAVENOUS | Status: AC
  Administered 2016-05-13: 2 g via INTRAVENOUS
  Filled 2016-05-13: qty 100

## 2016-05-13 MED ORDER — LIDOCAINE HCL 1 % IJ SOLN
INTRAMUSCULAR | Status: AC | PRN
  Administered 2016-05-13: 20 mL

## 2016-05-13 MED ORDER — LIDOCAINE HCL 1 % IJ SOLN
INTRAMUSCULAR | Status: AC
Start: 2016-05-13 — End: 2016-05-13
  Filled 2016-05-13: qty 20

## 2016-05-13 MED ORDER — FENTANYL CITRATE (PF) 100 MCG/2ML IJ SOLN
INTRAMUSCULAR | Status: AC | PRN
  Administered 2016-05-13: 50 ug via INTRAVENOUS
  Administered 2016-05-13: 25 ug via INTRAVENOUS

## 2016-05-13 MED ORDER — MIDAZOLAM HCL 2 MG/2ML IJ SOLN
INTRAMUSCULAR | Status: AC
Start: 2016-05-13 — End: 2016-05-13
  Filled 2016-05-13: qty 6

## 2016-05-13 MED ORDER — MIDAZOLAM HCL 2 MG/2ML IJ SOLN
INTRAMUSCULAR | Status: DC | PRN
  Administered 2016-05-13 (×2): 1 mg via INTRAVENOUS

## 2016-05-13 MED ORDER — MIDAZOLAM HCL 2 MG/2ML IJ SOLN
INTRAMUSCULAR | Status: AC | PRN
  Administered 2016-05-13 (×2): 1 mg via INTRAVENOUS

## 2016-05-13 MED ORDER — HEPARIN SOD (PORK) LOCK FLUSH 100 UNIT/ML IV SOLN
INTRAVENOUS | Status: AC | PRN
  Administered 2016-05-13: 500 [IU]

## 2016-05-13 MED ORDER — SODIUM CHLORIDE 0.9 % IV SOLN
Freq: Once | INTRAVENOUS | Status: AC
  Administered 2016-05-13: 13:00:00 via INTRAVENOUS

## 2016-05-13 MED ORDER — FENTANYL CITRATE (PF) 100 MCG/2ML IJ SOLN
INTRAMUSCULAR | Status: AC
  Filled 2016-05-13: qty 4

## 2016-05-13 NOTE — Progress Notes (Signed)
Patient ID: Angelica Sullivan, female   DOB: May 06, 1933, 82 y.o.   MRN: [id number]    Referring Physician(s): Heath Lark  Supervising Physician: Aletta Edouard  Patient Status:  Outpatient  Chief Complaint:  "I'm getting another port a cath"  Subjective: Patient familiar to IR service from prior right chest wall Port-A-Cath placed in February 2015. Catheter was subsequently removed in December 2015. She has a history of CLL and has undergone chemotherapy. She now has mild progressive leukocytosis with questionable recurrence. History also notable for thrombocytopenia and now with acquired hypogammaglobulinemia. She has poor venous access and is currently receiving IVIG infusions. A new Port-A-Cath is requested for future infusions. She currently denies fever, chest pain, dyspnea, cough, abdominal/back pain, nausea, vomiting or abnormal bleeding. She does have nasal allergy symptoms with occasional sinus drainage. Additional history as below. Past Medical History  Diagnosis Date  . Hypertension   . Hyperlipidemia   . Irritable bowel   . Osteoporosis   . GERD (gastroesophageal reflux disease)   . Rhinitis, chronic 09/15/2011  . Thrombocytopenia ([place]) 09/15/2011  . Leukopenia 01/16/2014  . Confusion 07/03/2015  . Leukemia ([place]) 08/06/09    CLL  . 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 TIA (transient ischemic attack) 07/05/2015  . Hypogammaglobulinemia, acquired ([place]) 02/05/2016   Past Surgical History  Procedure Laterality Date  . Tonsillectomy and adenoidectomy    . Vaginal hysterectomy    . Anterior and posterior vaginal repair    . Hemorrhoid surgery    . Laparoscopic cholecystectomy    . Anterior and posterior vaginal repair      repeat  . Broncoscopy       Allergies: Bactrim  Medications: Prior to Admission medications   Medication Sig Start Date End Date Taking? Authorizing Provider  acyclovir (ZOVIRAX) 400 MG tablet Take 1 tablet (400 mg total) by mouth daily.  03/10/16  Yes Heath Lark, MD  atorvastatin (LIPITOR) 20 MG tablet Take 20 mg by mouth every evening.    Yes Historical Provider, MD  calcium citrate-vitamin D (CITRACAL+D) 315-200 MG-UNIT per tablet Take 1 tablet by mouth daily.    Yes Historical Provider, MD  Cholecalciferol (VITAMIN D) 400 UNITS capsule Take 1,000 Units by mouth at bedtime.    Yes Historical Provider, MD  diphenhydrAMINE (BENADRYL) 25 mg capsule Take 25 mg by mouth at bedtime as needed for allergies (allergies).    Yes Historical Provider, MD  Echinacea 400 MG CAPS Take 1 capsule by mouth 2 (two) times daily.     Yes Historical Provider, MD  ESTRACE VAGINAL 0.1 MG/GM vaginal cream Place 1 Applicatorful vaginally 2 (two) times a week.  10/24/13  Yes Historical Provider, MD  fluticasone (FLONASE) 50 MCG/ACT nasal spray Place 2 sprays into both nostrils daily. 01/30/16  Yes Historical Provider, MD  glucosamine-chondroitin 500-400 MG tablet Take 1 tablet by mouth daily.     Yes Historical Provider, MD  levETIRAcetam (KEPPRA XR) 500 MG 24 hr tablet Take 2 tablets (1,000 mg total) by mouth daily. 04/08/16  Yes Rebecca S Tat, DO  loratadine (CLARITIN) 10 MG tablet Take 10 mg by mouth daily.     Yes Historical Provider, MD  montelukast (SINGULAIR) 10 MG tablet Take 10 mg by mouth daily.    Yes Historical Provider, MD  Multiple Vitamin (MULTIVITAMIN) tablet Take 1 tablet by mouth daily.     Yes Historical Provider, MD  polyvinyl alcohol (ARTIFICIAL TEARS) 1.4 % ophthalmic solution Place 1 drop into both eyes 3 (three) times daily  as needed for dry eyes (dry eyes). Reported on 03/10/2016   Yes Historical Provider, MD  predniSONE (DELTASONE) 20 MG tablet Take 1 tablet (20 mg total) by mouth daily with breakfast. 04/07/16  Yes Heath Lark, MD  sodium chloride (OCEAN) 0.65 % SOLN nasal spray Place 1 spray into both nostrils 2 (two) times daily.    Yes Historical Provider, MD     Vital Signs: BP 149/83 mmHg  Pulse 71  Temp(Src) 97.7 F (36.5 C)  (Oral)  Resp 16  Wt 114 lb 12.8 oz (52.073 kg)  SpO2 100%  Physical Exam patient awake, alert. Chest clear to auscultation bilaterally. Heart with regular rate and rhythm. Abdomen soft, positive bowel sounds, nontender. Lower extremities with no significant edema.  Imaging: No results found.  Labs:  CBC:  Recent Labs  02/05/16 1011 03/10/16 0839 04/07/16 0915 05/05/16 0850  WBC 6.9 6.2 6.9 7.8  HGB 13.0 13.1 13.7 13.8  HCT 39.9 38.1 41.7 42.1  PLT 147 82* 94* 91*    COAGS: No results for input(s): INR, APTT in the last 8760 hours.  BMP:  Recent Labs  12/11/15 0944 02/05/16 1011 04/07/16 0915 05/05/16 0850  NA 140 138 143 140  K 4.7 4.7 5.3* 3.6  CO2 26 27 28 25   GLUCOSE 90 71 99 101  BUN 14.7 11.3 13.9 12.6  CALCIUM 10.0 10.1 10.3 10.0  CREATININE 1.0 0.8 1.0 0.9    LIVER FUNCTION TESTS:  Recent Labs  12/11/15 0944 02/05/16 1011 04/07/16 0915 05/05/16 0850  BILITOT 0.51 0.53 0.53 0.54  AST 20 21 22 18   ALT 22 19 15 19   ALKPHOS 143 103 44 43  PROT 5.9* 5.9* 6.7 6.8  ALBUMIN 3.5 3.2* 3.8 3.9    Assessment and Plan: Patient with prior history of CLL, status post chemotherapy. Now with mild progressive leukocytosis and possible recurrence. Also with history of thrombocytopenia and acquired hypogammaglobulinemia, currently receiving IVIG infusions. She has poor venous access and request now received for Port-A-Cath placement for future infusions.Risks and benefits discussed with the patient/spouse including, but not limited to bleeding, infection, pneumothorax, or fibrin sheath development and need for additional procedures.All of the patient's questions were answered, patient is agreeable to proceed.Consent signed and in chart.Labs pending.     Electronically Signed: D. Rowe Robert 05/13/2016, 12:51 PM   I spent a total of 20 minutes at the the patient's bedside AND on the patient's hospital floor or unit, greater than 50% of which was  counseling/coordinating care for Port-A-Cath placement

## 2016-05-13 NOTE — Progress Notes (Signed)
Patient came in to bring award letter and POE. Scanned and emailed to billing and sent copy to HIM to be scanned to record. Asked patient if she needed anything else and she states no. Patient has my card for any additional questions or concerns.

## 2016-05-13 NOTE — Discharge Instructions (Signed)

## 2016-05-13 NOTE — Procedures (Signed)
Interventional Radiology Procedure Note  Procedure: Placement of a right IJ approach single lumen PowerPort.  Tip is positioned at the superior cavoatrial junction and catheter is ready for immediate use.  Complications: No immediate Recommendations:  - Ok to shower tomorrow - Do not submerge for 7 days - Routine line care   Elvie Maines T. Dianelys Scinto, M.D Pager:  [phone]   

## 2016-05-23 ENCOUNTER — Ambulatory Visit (INDEPENDENT_AMBULATORY_CARE_PROVIDER_SITE_OTHER): Payer: Medicare Other | Admitting: Neurology

## 2016-05-23 ENCOUNTER — Encounter: Payer: Self-pay | Admitting: Neurology

## 2016-05-23 VITALS — BP 114/60 | HR 66 | Ht 60.0 in | Wt 115.0 lb

## 2016-05-23 DIAGNOSIS — G40209 Localization-related (focal) (partial) symptomatic epilepsy and epileptic syndromes with complex partial seizures, not intractable, without status epilepticus: Secondary | ICD-10-CM

## 2016-05-23 NOTE — Progress Notes (Signed)
Angelica Sullivan was seen today in neurologic consultation at the request of Precious Reel, MD.  The patient is accompanied by her husband who supplements the history.  The patient experienced a fall on August 25; she had been outside cleaning in the heat and was going up an incline and she fell and she hit her head and hit her tailbone.  No LOC.  Her husband helped her up.  A few days later, she went to take a shower and her husband noted she didn't follow the normal routine (she didn't get her shower cap, towel, etc).  When she went to get out of the shower she grabbed a towel but asked her husband where the towel was.  She then walked over to a stack of towels and pulled the phone on top of her foot.  She felt a bit confused but her husband states that was the "end" of the event. Her speech was clear.  Just a few days later on August 29, she had just got up from her nap.   She told her husband that she didn't feel well (nausea).  She got up and before she even got into the bedroom door, she had pulled down her pants to go to the bathroom even though she was no where near the bathroom.  Her husband then heard a thump and she was in her closet and sat on the floor with pants down.  He asked her if she fell and she said "no, I have to use the bathroom."  She urinated on the floor and she asked for toilet paper.  She was then back to normal.  She states today that she remembers the entire event but her husband states that she didn't remember it until he relayed the story a few times.  No history of similar previously.  No events since that time.  That same day she had a CT of the head without contrast which I reviewed.  There was nothing acute on this examination.  There was atrophy and small vessel disease.  Her chemotherapy dose was subsequently decreased.  She was sent here for evaluation of possible TIA.  She had an EKG that was unremarkable for atrial fibrillation.  08/09/15 update:  The patient returns today  for follow-up, accompanied by her husband who supplements the history.  The patient had an MRI of the brain since last visit on 07/19/2015.  There is evidence of very significant cerebral small vessel disease, but it was otherwise unremarkable and nonacute.  She had a routine EEG, followed by a 24 hour ambulatory EEG.  Both of these demonstrated evidence of left temporal slowing, without evidence of epileptiform activity.  The patient has had no further events since our last visit.  12/11/15 update:  The patient follows up today, accompanied by her husband who supplements the history.  I have reviewed prior records made available to me.  Last visit, I started the patient on Keppra for what sounded like complex partial seizures.  She is on 500 mg twice a day.  She has had no further events.  She has complained of fatigue and thinks that it could be due to the [place].  States that she initially felt that things were "not clear" and not cognitively right but she feels better now.   However, she has been neutropenic and recently chemotherapy was held because of this.  She states that she also had the flu and had an "infection" of the bone marrow.  05/23/16 update:  The patient follows up today, accompanied by her husband who supplements the history.  I have reviewed prior records made available to me.  She is now on Keppra XR, 500 mg, 2 tablets at night.  This was changed because the patient complained of fatigue, but I told her I doubted that was from [place].  She does think she is doing better with this but she has had no further spells.   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  She has been struggling with skin rash and dry skin.  She has been moisturizing with vaseline and that has helped some.  She saw derm for the rash but the med was $650 for 8oz tube so she didn't use it.  Planning to have cataract surgery in [place] in mid august.   ALLERGIES:   Allergies  Allergen  Reactions  . Bactrim [Sulfamethoxazole W/Trimethoprim (Co-Trimoxazole)] Diarrhea and Other (See Comments)    Abdominal cramping    CURRENT MEDICATIONS:  Outpatient Encounter Prescriptions as of 05/23/2016  Medication Sig  . acyclovir (ZOVIRAX) 400 MG tablet Take 1 tablet (400 mg total) by mouth daily.  Marland Kitchen atorvastatin (LIPITOR) 20 MG tablet Take 20 mg by mouth every evening.   . calcium citrate-vitamin D (CITRACAL+D) 315-200 MG-UNIT per tablet Take 1 tablet by mouth daily.   . Cholecalciferol (VITAMIN D) 400 UNITS capsule Take 1,000 Units by mouth at bedtime.   . diphenhydrAMINE (BENADRYL) 25 mg capsule Take 25 mg by mouth at bedtime as needed for allergies (allergies).   . Echinacea 400 MG CAPS Take 1 capsule by mouth 2 (two) times daily.    Marland Kitchen ESTRACE VAGINAL 0.1 MG/GM vaginal cream Place 1 Applicatorful vaginally 2 (two) times a week.   . fluticasone (FLONASE) 50 MCG/ACT nasal spray Place 2 sprays into both nostrils daily.  Marland Kitchen glucosamine-chondroitin 500-400 MG tablet Take 1 tablet by mouth daily.    Marland Kitchen levETIRAcetam (KEPPRA XR) 500 MG 24 hr tablet Take 2 tablets (1,000 mg total) by mouth daily.  Marland Kitchen loratadine (CLARITIN) 10 MG tablet Take 10 mg by mouth daily.    . montelukast (SINGULAIR) 10 MG tablet Take 10 mg by mouth daily.   . Multiple Vitamin (MULTIVITAMIN) tablet Take 1 tablet by mouth daily.    . polyvinyl alcohol (ARTIFICIAL TEARS) 1.4 % ophthalmic solution Place 1 drop into both eyes 3 (three) times daily as needed for dry eyes (dry eyes). Reported on 03/10/2016  . sodium chloride (OCEAN) 0.65 % SOLN nasal spray Place 1 spray into both nostrils 2 (two) times daily.   . [DISCONTINUED] predniSONE (DELTASONE) 20 MG tablet Take 1 tablet (20 mg total) by mouth daily with breakfast.   No facility-administered encounter medications on file as of 05/23/2016.    PAST MEDICAL HISTORY:   Past Medical History  Diagnosis Date  . Hypertension   . Hyperlipidemia   . Irritable bowel   .  Osteoporosis   . GERD (gastroesophageal reflux disease)   . Rhinitis, chronic 09/15/2011  . Thrombocytopenia ([place]) 09/15/2011  . Leukopenia 01/16/2014  . Confusion 07/03/2015  . Leukemia ([place]) 08/06/09    CLL  . 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 TIA (transient ischemic attack) 07/05/2015  . Hypogammaglobulinemia, acquired ([place]) 02/05/2016    PAST SURGICAL HISTORY:   Past Surgical History  Procedure Laterality Date  . Tonsillectomy and adenoidectomy    . Vaginal hysterectomy    . Anterior and posterior vaginal repair    .  Hemorrhoid surgery    . Laparoscopic cholecystectomy    . Anterior and posterior vaginal repair      repeat  . Broncoscopy      SOCIAL HISTORY:   Social History   Social History  . Marital Status: Married    Spouse Name: N/A  . Number of Children: N/A  . Years of Education: N/A   Occupational History  . Not on file.   Social History Main Topics  . Smoking status: Never Smoker   . Smokeless tobacco: Never Used  . Alcohol Use: No  . Drug Use: No  . Sexual Activity: No   Other Topics Concern  . Not on file   Social History Narrative    FAMILY HISTORY:   Family Status  Relation Status Death Age  . Mother Deceased     pneumonia  . Father Deceased     unknown  . Sister Alive     lung cancer, mitral valve  . Sister Alive     healthy  . Brother Deceased 50  . Brother Deceased     memory issues    ROS:  A complete 10 system review of systems was obtained and was unremarkable apart from what is mentioned above.  PHYSICAL EXAMINATION:    VITALS:   Filed Vitals:   05/23/16 0859  BP: 114/60  Pulse: 66  Height: 5' (1.524 m)  Weight: 115 lb (52.164 kg)    GEN:  Normal appears female in no acute distress.  Appears stated age. HEENT:  Normocephalic.  The mucous membranes are moist. The superficial temporal arteries are without ropiness or tenderness. Cardiovascular: Regular rate and rhythm. Lungs: Clear to auscultation  bilaterally. Neck/Heme: There are no carotid bruits noted bilaterally. Skin:  She has lesions on the face (forehead), neck, hands that the dermatologist has just frozen.  NEUROLOGICAL: Orientation:  The patient is alert and oriented x 3.  Looks to her husband to answer detailed questions Cranial nerves: There is good facial symmetry. Marland Kitchen Speech is fluent and clear. Soft palate rises symmetrically and there is no tongue deviation. Hearing is intact to conversational tone. Tone: Tone is good throughout. Sensation: Sensation is intact to light touch  Coordination:  The patient has no difficulty with RAM's or FNF bilaterally. Motor: Strength is 5/5 in the bilateral upper and lower extremities.  Shoulder shrug is equal and symmetric. There is no pronator drift.  There are no fasciculations noted. Gait and Station: The patient is able to ambulate without difficulty.     IMPRESSION/PLAN  1.  Transient alteration of awareness  -The patient has now had 2 events that are suspicious for complex partial seizures.    -continue Keppra XR 500 mg - 2 po daily.    -talked about [place] driving laws and recommended she not drive at least for 6 months from last event (august 29).  She understood.  Sz and safety discussed 2.  F/u in 6-7 months, sooner should new neurology issues arise.  Much greater than 50% of this visit was spent in counseling with the patient and the family.  Total face to face time:  20 min

## 2016-06-02 ENCOUNTER — Ambulatory Visit (HOSPITAL_BASED_OUTPATIENT_CLINIC_OR_DEPARTMENT_OTHER): Payer: Medicare Other | Admitting: Hematology and Oncology

## 2016-06-02 ENCOUNTER — Encounter: Payer: Self-pay | Admitting: Hematology and Oncology

## 2016-06-02 ENCOUNTER — Other Ambulatory Visit (HOSPITAL_BASED_OUTPATIENT_CLINIC_OR_DEPARTMENT_OTHER): Payer: Medicare Other

## 2016-06-02 ENCOUNTER — Ambulatory Visit (HOSPITAL_BASED_OUTPATIENT_CLINIC_OR_DEPARTMENT_OTHER): Payer: Medicare Other

## 2016-06-02 ENCOUNTER — Telehealth: Payer: Self-pay | Admitting: Hematology and Oncology

## 2016-06-02 VITALS — BP 134/50 | HR 70 | Temp 97.6°F | Resp 16

## 2016-06-02 DIAGNOSIS — C9110 Chronic lymphocytic leukemia of B-cell type not having achieved remission: Secondary | ICD-10-CM

## 2016-06-02 DIAGNOSIS — R21 Rash and other nonspecific skin eruption: Secondary | ICD-10-CM

## 2016-06-02 DIAGNOSIS — D801 Nonfamilial hypogammaglobulinemia: Secondary | ICD-10-CM

## 2016-06-02 DIAGNOSIS — D696 Thrombocytopenia, unspecified: Secondary | ICD-10-CM

## 2016-06-02 LAB — CBC WITH DIFFERENTIAL/PLATELET
BASO%: 1 % (ref 0.0–2.0)
Basophils Absolute: 0.1 10*3/uL (ref 0.0–0.1)
EOS%: 2.1 % (ref 0.0–7.0)
Eosinophils Absolute: 0.2 10*3/uL (ref 0.0–0.5)
HCT: 43.7 % (ref 34.8–46.6)
HGB: 14.5 g/dL (ref 11.6–15.9)
LYMPH%: 46.9 % (ref 14.0–49.7)
MCH: 29.8 pg (ref 25.1–34.0)
MCHC: 33.2 g/dL (ref 31.5–36.0)
MCV: 89.8 fL (ref 79.5–101.0)
MONO#: 0.8 10*3/uL (ref 0.1–0.9)
MONO%: 10.2 % (ref 0.0–14.0)
NEUT#: 3.1 10*3/uL (ref 1.5–6.5)
NEUT%: 39.8 % (ref 38.4–76.8)
PLATELETS: 99 10*3/uL — AB (ref 145–400)
RBC: 4.87 10*6/uL (ref 3.70–5.45)
RDW: 14.1 % (ref 11.2–14.5)
WBC: 7.8 10*3/uL (ref 3.9–10.3)
lymph#: 3.7 10*3/uL — ABNORMAL HIGH (ref 0.9–3.3)

## 2016-06-02 LAB — COMPREHENSIVE METABOLIC PANEL
ALT: 22 U/L (ref 0–55)
ANION GAP: 9 meq/L (ref 3–11)
AST: 30 U/L (ref 5–34)
Albumin: 4.1 g/dL (ref 3.5–5.0)
Alkaline Phosphatase: 49 U/L (ref 40–150)
BUN: 16.4 mg/dL (ref 7.0–26.0)
CHLORIDE: 106 meq/L (ref 98–109)
CO2: 26 meq/L (ref 22–29)
CREATININE: 1.1 mg/dL (ref 0.6–1.1)
Calcium: 10.3 mg/dL (ref 8.4–10.4)
EGFR: 48 mL/min/{1.73_m2} — ABNORMAL LOW (ref >90)
Glucose: 74 mg/dl (ref 70–140)
Potassium: 4.8 mEq/L (ref 3.5–5.1)
Sodium: 140 mEq/L (ref 136–145)
Total Bilirubin: 0.48 mg/dL (ref 0.20–1.20)
Total Protein: 7.3 g/dL (ref 6.4–8.3)

## 2016-06-02 MED ORDER — METHYLPREDNISOLONE SODIUM SUCC 40 MG IJ SOLR
INTRAMUSCULAR | Status: AC
  Filled 2016-06-02: qty 1

## 2016-06-02 MED ORDER — DIPHENHYDRAMINE HCL 25 MG PO TABS
25.0000 mg | ORAL_TABLET | Freq: Once | ORAL | Status: AC
  Administered 2016-06-02: 25 mg via ORAL
  Filled 2016-06-02: qty 1

## 2016-06-02 MED ORDER — ACETAMINOPHEN 325 MG PO TABS
ORAL_TABLET | ORAL | Status: AC
  Filled 2016-06-02: qty 2

## 2016-06-02 MED ORDER — ACETAMINOPHEN 325 MG PO TABS
650.0000 mg | ORAL_TABLET | Freq: Once | ORAL | Status: AC
  Administered 2016-06-02: 650 mg via ORAL

## 2016-06-02 MED ORDER — HEPARIN SOD (PORK) LOCK FLUSH 100 UNIT/ML IV SOLN
500.0000 [IU] | Freq: Once | INTRAVENOUS | Status: AC | PRN
  Administered 2016-06-02: 500 [IU]
  Filled 2016-06-02: qty 5

## 2016-06-02 MED ORDER — METHYLPREDNISOLONE SODIUM SUCC 40 MG IJ SOLR
40.0000 mg | Freq: Once | INTRAMUSCULAR | Status: AC
  Administered 2016-06-02: 40 mg via INTRAVENOUS

## 2016-06-02 MED ORDER — DIPHENHYDRAMINE HCL 25 MG PO CAPS
ORAL_CAPSULE | ORAL | Status: AC
  Filled 2016-06-02: qty 1

## 2016-06-02 MED ORDER — SODIUM CHLORIDE 0.9 % IJ SOLN
10.0000 mL | INTRAMUSCULAR | Status: DC | PRN
  Administered 2016-06-02: 10 mL
  Filled 2016-06-02: qty 10

## 2016-06-02 MED ORDER — IMMUNE GLOBULIN (HUMAN) 10 GM/100ML IV SOLN
0.9000 g/kg | Freq: Once | INTRAVENOUS | Status: AC
  Administered 2016-06-02: 50 g via INTRAVENOUS
  Filled 2016-06-02: qty 400

## 2016-06-02 MED ORDER — ACETAMINOPHEN 325 MG PO TABS: 650.0000 mg | ORAL_TABLET | Freq: Four times a day (QID) | ORAL | Status: DC | PRN

## 2016-06-02 NOTE — Patient Instructions (Signed)

## 2016-06-02 NOTE — Assessment & Plan Note (Signed)
Repeat immunoglobulin levels were very low, related to CLL and recent treatment. She had repeated courses of oral antibiotics and was not getting better with symptoms of significant nasal drainage and sinusitis. Since we started her on IVIG treatment, overall she has improvement. I plan to proceed with treatment today and maintenance therapy every 4 weeks

## 2016-06-02 NOTE — Assessment & Plan Note (Signed)
There is evidence of mild progressive leukocytosis, worrisome for recurrence but she is not symptomatic I recommend observation only for now

## 2016-06-02 NOTE — Assessment & Plan Note (Signed)
She has persistent skin rash. There were evidence of actinic keratosis, dry skin with possible flare of CLL. Leukocytoclastic vasculitis related to CLL cannot be excluded. She responded well with short course prednisone. I recommend observation

## 2016-06-02 NOTE — Progress Notes (Signed)
[facility] OFFICE PROGRESS NOTE  Patient Care Team: Shon Baton, MD as PCP - General (Internal Medicine) Heath Lark, MD as Consulting Physician (Hematology and Oncology) Eustace Quail Tat, DO as Consulting Physician (Neurology)  SUMMARY OF ONCOLOGIC HISTORY: Oncology History   CLL stage IV, deletion 13q     CLL (chronic lymphocytic leukemia) ([place])   12/12/2013 Bone Marrow Biopsy    Bone marrow biopsy show significant involvement of CLL     12/13/2013 Procedure    Patient had placement of a port     12/14/2013 Imaging    CT scan of the chest, abdomen and pelvis showed significant lymphadenopathy and splenomegaly     12/20/2013 - 12/22/2013 Hospital Admission    The patient was admitted to the hospital to begin cycle 1 day 1 of chemotherapy due to risk of infusion reaction. She tolerated treatment well without major side effects.     12/20/2013 - 03/13/2014 Chemotherapy    started on cycle 1 of Obinutuzumab, chlorambucil and prednisone. All treatment placed on hold on 03/13/2014 due to persistent pancytopenia.     03/28/2014 Imaging    Repeat imaging study showed excellent response to treatment.     10/25/2014 Imaging    She has persistent mild lymphadenopathy and splenomegaly, improved compared to previous exam     04/23/2015 Imaging    Ct scan showed disease progression     05/09/2015 - 11/26/2015 Chemotherapy    She started taking ibrutinib.     11/26/2015 Adverse Reaction    Treatment  was placed on hold due to neutropenia and recent infection     02/11/2016 -  Chemotherapy    She is started on monthly IVIG Rx for recurrent infection and acquired panhypogammaglobulinemia      INTERVAL HISTORY: Please see below for problem oriented charting. She returns for IVIG treatment. She denies recent infection. Her skin rashes stable/improved. The patient denies any recent signs or symptoms of bleeding such as spontaneous epistaxis, hematuria or hematochezia.  REVIEW OF  SYSTEMS:   Constitutional: Denies fevers, chills or abnormal weight loss Eyes: Denies blurriness of vision Ears, nose, mouth, throat, and face: Denies mucositis or sore throat Respiratory: Denies cough, dyspnea or wheezes Cardiovascular: Denies palpitation, chest discomfort or lower extremity swelling Gastrointestinal:  Denies nausea, heartburn or change in bowel habits Lymphatics: Denies new lymphadenopathy or easy bruising Neurological:Denies numbness, tingling or new weaknesses Behavioral/Psych: Mood is stable, no new changes  All other systems were reviewed with the patient and are negative.  I have reviewed the past medical history, past surgical history, social history and family history with the patient and they are unchanged from previous note.  ALLERGIES:  is allergic to bactrim [sulfamethoxazole w/trimethoprim (co-trimoxazole)].  MEDICATIONS:  Current Outpatient Prescriptions  Medication Sig Dispense Refill  . acyclovir (ZOVIRAX) 400 MG tablet Take 1 tablet (400 mg total) by mouth daily. 90 tablet 3  . atorvastatin (LIPITOR) 20 MG tablet Take 20 mg by mouth every evening.     . calcium citrate-vitamin D (CITRACAL+D) 315-200 MG-UNIT per tablet Take 1 tablet by mouth daily.     . Cholecalciferol (VITAMIN D) 400 UNITS capsule Take 1,000 Units by mouth at bedtime.     . diphenhydrAMINE (BENADRYL) 25 mg capsule Take 25 mg by mouth at bedtime as needed for allergies (allergies).     . Echinacea 400 MG CAPS Take 1 capsule by mouth 2 (two) times daily.      Marland Kitchen ESTRACE VAGINAL 0.1 MG/GM vaginal cream  Place 1 Applicatorful vaginally 2 (two) times a week.     Marland Kitchen glucosamine-chondroitin 500-400 MG tablet Take 1 tablet by mouth daily.      Marland Kitchen levETIRAcetam (KEPPRA XR) 500 MG 24 hr tablet Take 2 tablets (1,000 mg total) by mouth daily. 180 tablet 1  . loratadine (CLARITIN) 10 MG tablet Take 10 mg by mouth daily.      . montelukast (SINGULAIR) 10 MG tablet Take 10 mg by mouth daily.     .  Multiple Vitamin (MULTIVITAMIN) tablet Take 1 tablet by mouth daily.      . polyvinyl alcohol (ARTIFICIAL TEARS) 1.4 % ophthalmic solution Place 1 drop into both eyes 3 (three) times daily as needed for dry eyes (dry eyes). Reported on 03/10/2016    . sodium chloride (OCEAN) 0.65 % SOLN nasal spray Place 1 spray into both nostrils 2 (two) times daily.      No current facility-administered medications for this visit.     PHYSICAL EXAMINATION: ECOG PERFORMANCE STATUS: 1 - Symptomatic but completely ambulatory  Vitals:   06/02/16 0905  BP: (!) 151/58  Pulse: 66  Resp: 18  Temp: 97.6 F (36.4 C)   Filed Weights   06/02/16 0905  Weight: 116 lb 9.6 oz (52.9 kg)    GENERAL:alert, no distress and comfortable SKIN: She has persistent skin rash on sun exposed area. No petechiae EYES: normal, Conjunctiva are pink and non-injected, sclera clear OROPHARYNX:no exudate, no erythema and lips, buccal mucosa, and tongue normal  NECK: supple, thyroid normal size, non-tender, without nodularity LYMPH:  no palpable lymphadenopathy in the cervical, axillary or inguinal LUNGS: clear to auscultation and percussion with normal breathing effort HEART: regular rate & rhythm and no murmurs and no lower extremity edema ABDOMEN:abdomen soft, non-tender and normal bowel sounds Musculoskeletal:no cyanosis of digits and no clubbing  NEURO: alert & oriented x 3 with fluent speech, no focal motor/sensory deficits  LABORATORY DATA:  I have reviewed the data as listed    Component Value Date/Time   NA 139 05/13/2016 1238   NA 140 05/05/2016 0850   K 4.6 05/13/2016 1238   K 3.6 05/05/2016 0850   CL 109 05/13/2016 1238   CL 99 04/08/2013 0922   CO2 23 05/13/2016 1238   CO2 25 05/05/2016 0850   GLUCOSE 88 05/13/2016 1238   GLUCOSE 101 05/05/2016 0850   GLUCOSE 92 04/08/2013 0922   BUN 12 05/13/2016 1238   BUN 12.6 05/05/2016 0850   CREATININE 0.97 05/13/2016 1238   CREATININE 0.9 05/05/2016 0850    CALCIUM 9.6 05/13/2016 1238   CALCIUM 10.0 05/05/2016 0850   PROT 6.8 05/05/2016 0850   ALBUMIN 3.9 05/05/2016 0850   AST 18 05/05/2016 0850   ALT 19 05/05/2016 0850   ALKPHOS 43 05/05/2016 0850   BILITOT 0.54 05/05/2016 0850   GFRNONAA 53 (L) 05/13/2016 1238   GFRAA >60 05/13/2016 1238    No results found for: SPEP, UPEP  Lab Results  Component Value Date   WBC 7.8 06/02/2016   NEUTROABS 3.1 06/02/2016   HGB 14.5 06/02/2016   HCT 43.7 06/02/2016   MCV 89.8 06/02/2016   PLT 99 (L) 06/02/2016      Chemistry      Component Value Date/Time   NA 139 05/13/2016 1238   NA 140 05/05/2016 0850   K 4.6 05/13/2016 1238   K 3.6 05/05/2016 0850   CL 109 05/13/2016 1238   CL 99 04/08/2013 0922   CO2 23 05/13/2016 1238  CO2 25 05/05/2016 0850   BUN 12 05/13/2016 1238   BUN 12.6 05/05/2016 0850   CREATININE 0.97 05/13/2016 1238   CREATININE 0.9 05/05/2016 0850      Component Value Date/Time   CALCIUM 9.6 05/13/2016 1238   CALCIUM 10.0 05/05/2016 0850   ALKPHOS 43 05/05/2016 0850   AST 18 05/05/2016 0850   ALT 19 05/05/2016 0850   BILITOT 0.54 05/05/2016 0850      ASSESSMENT & PLAN:  CLL (chronic lymphocytic leukemia) There is evidence of mild progressive leukocytosis, worrisome for recurrence but she is not symptomatic I recommend observation only for now   Thrombocytopenia [facility]) She has intermittent fluctuation of thrombocytopenia. It could be related to CLL, ITP, or bone marrow suppression from recurrent infection. She is not symptomatic apart from minor bruising. I will observe for now. I will continue to monitor CBC on the monthly basis and we will consider further evaluation if her platelet count dropped to closer to 50,000. Her CBC is stable today.   Hypogammaglobulinemia, acquired ([place]) Repeat immunoglobulin levels were very low, related to CLL and recent treatment. She had repeated courses of oral antibiotics and was not getting better with symptoms of  significant nasal drainage and sinusitis. Since we started her on IVIG treatment, overall she has improvement. I plan to proceed with treatment today and maintenance therapy every 4 weeks  Skin rash She has persistent skin rash. There were evidence of actinic keratosis, dry skin with possible flare of CLL. Leukocytoclastic vasculitis related to CLL cannot be excluded. She responded well with short course prednisone. I recommend observation   No orders of the defined types were placed in this encounter.  All questions were answered. The patient knows to call the clinic with any problems, questions or concerns. No barriers to learning was detected. I spent 15 minutes counseling the patient face to face. The total time spent in the appointment was 20 minutes and more than 50% was on counseling and review of test results     [facility], [place], MD 06/02/2016 9:29 AM

## 2016-06-02 NOTE — Telephone Encounter (Signed)
Gave pt cal & avs °

## 2016-06-02 NOTE — Telephone Encounter (Signed)
Added apt per pof °

## 2016-06-02 NOTE — Assessment & Plan Note (Signed)
She has intermittent fluctuation of thrombocytopenia. It could be related to CLL, ITP, or bone marrow suppression from recurrent infection. She is not symptomatic apart from minor bruising. I will observe for now. I will continue to monitor CBC on the monthly basis and we will consider further evaluation if her platelet count dropped to closer to 50,000. Her CBC is stable today.

## 2016-06-30 ENCOUNTER — Other Ambulatory Visit (HOSPITAL_BASED_OUTPATIENT_CLINIC_OR_DEPARTMENT_OTHER): Payer: Medicare Other

## 2016-06-30 ENCOUNTER — Ambulatory Visit (HOSPITAL_BASED_OUTPATIENT_CLINIC_OR_DEPARTMENT_OTHER): Payer: Medicare Other

## 2016-06-30 VITALS — BP 153/75 | HR 71 | Temp 97.7°F | Resp 18

## 2016-06-30 DIAGNOSIS — D801 Nonfamilial hypogammaglobulinemia: Secondary | ICD-10-CM | POA: Diagnosis not present

## 2016-06-30 DIAGNOSIS — C9110 Chronic lymphocytic leukemia of B-cell type not having achieved remission: Secondary | ICD-10-CM

## 2016-06-30 LAB — COMPREHENSIVE METABOLIC PANEL
ALBUMIN: 3.9 g/dL (ref 3.5–5.0)
ALK PHOS: 47 U/L (ref 40–150)
ALT: 16 U/L (ref 0–55)
ANION GAP: 9 meq/L (ref 3–11)
AST: 26 U/L (ref 5–34)
BILIRUBIN TOTAL: 0.46 mg/dL (ref 0.20–1.20)
BUN: 11.9 mg/dL (ref 7.0–26.0)
CALCIUM: 10.1 mg/dL (ref 8.4–10.4)
CO2: 25 mEq/L (ref 22–29)
Chloride: 105 mEq/L (ref 98–109)
Creatinine: 0.9 mg/dL (ref 0.6–1.1)
EGFR: 59 mL/min/{1.73_m2} — AB (ref >90)
Glucose: 74 mg/dl (ref 70–140)
Potassium: 4.5 mEq/L (ref 3.5–5.1)
Sodium: 140 mEq/L (ref 136–145)
TOTAL PROTEIN: 7.2 g/dL (ref 6.4–8.3)

## 2016-06-30 LAB — CBC WITH DIFFERENTIAL/PLATELET
BASO%: 0.9 % (ref 0.0–2.0)
BASOS ABS: 0.1 10*3/uL (ref 0.0–0.1)
EOS ABS: 0.2 10*3/uL (ref 0.0–0.5)
EOS%: 1.6 % (ref 0.0–7.0)
HCT: 41.9 % (ref 34.8–46.6)
HEMOGLOBIN: 14 g/dL (ref 11.6–15.9)
LYMPH%: 42.4 % (ref 14.0–49.7)
MCH: 29.8 pg (ref 25.1–34.0)
MCHC: 33.4 g/dL (ref 31.5–36.0)
MCV: 89.1 fL (ref 79.5–101.0)
MONO#: 1.3 10*3/uL — AB (ref 0.1–0.9)
MONO%: 13.8 % (ref 0.0–14.0)
NEUT%: 41.3 % (ref 38.4–76.8)
NEUTROS ABS: 4 10*3/uL (ref 1.5–6.5)
PLATELETS: 95 10*3/uL — AB (ref 145–400)
RBC: 4.7 10*6/uL (ref 3.70–5.45)
RDW: 13.4 % (ref 11.2–14.5)
WBC: 9.6 10*3/uL (ref 3.9–10.3)
lymph#: 4.1 10*3/uL — ABNORMAL HIGH (ref 0.9–3.3)

## 2016-06-30 LAB — TECHNOLOGIST REVIEW

## 2016-06-30 MED ORDER — IMMUNE GLOBULIN (HUMAN) 10 GM/100ML IV SOLN
50.0000 g | Freq: Once | INTRAVENOUS | Status: AC
  Administered 2016-06-30: 50 g via INTRAVENOUS
  Filled 2016-06-30: qty 400

## 2016-06-30 MED ORDER — SODIUM CHLORIDE 0.9 % IJ SOLN
3.0000 mL | Freq: Once | INTRAMUSCULAR | Status: DC | PRN
  Filled 2016-06-30: qty 10

## 2016-06-30 MED ORDER — METHYLPREDNISOLONE SODIUM SUCC 40 MG IJ SOLR
40.0000 mg | Freq: Once | INTRAMUSCULAR | Status: AC
  Administered 2016-06-30: 40 mg via INTRAVENOUS

## 2016-06-30 MED ORDER — METHYLPREDNISOLONE SODIUM SUCC 40 MG IJ SOLR
INTRAMUSCULAR | Status: AC
  Filled 2016-06-30: qty 1

## 2016-06-30 MED ORDER — ACETAMINOPHEN 325 MG PO TABS
ORAL_TABLET | ORAL | Status: AC
  Filled 2016-06-30: qty 2

## 2016-06-30 MED ORDER — ACETAMINOPHEN 325 MG PO TABS
650.0000 mg | ORAL_TABLET | Freq: Once | ORAL | Status: AC
  Administered 2016-06-30: 650 mg via ORAL

## 2016-06-30 MED ORDER — ALTEPLASE 2 MG IJ SOLR
2.0000 mg | Freq: Once | INTRAMUSCULAR | Status: DC | PRN
  Filled 2016-06-30: qty 2

## 2016-06-30 MED ORDER — DIPHENHYDRAMINE HCL 25 MG PO CAPS
ORAL_CAPSULE | ORAL | Status: AC
  Filled 2016-06-30: qty 1

## 2016-06-30 MED ORDER — HEPARIN SOD (PORK) LOCK FLUSH 100 UNIT/ML IV SOLN
500.0000 [IU] | Freq: Once | INTRAVENOUS | Status: AC
  Administered 2016-06-30: 500 [IU] via INTRAVENOUS
  Filled 2016-06-30: qty 5

## 2016-06-30 MED ORDER — SODIUM CHLORIDE 0.9% FLUSH
10.0000 mL | INTRAVENOUS | Status: DC | PRN
  Administered 2016-06-30: 10 mL via INTRAVENOUS
  Filled 2016-06-30: qty 10

## 2016-06-30 MED ORDER — DIPHENHYDRAMINE HCL 25 MG PO TABS
25.0000 mg | ORAL_TABLET | Freq: Once | ORAL | Status: AC
  Administered 2016-06-30: 25 mg via ORAL
  Filled 2016-06-30: qty 1

## 2016-06-30 MED ORDER — HEPARIN SOD (PORK) LOCK FLUSH 100 UNIT/ML IV SOLN
250.0000 [IU] | Freq: Once | INTRAVENOUS | Status: DC | PRN
  Filled 2016-06-30: qty 5

## 2016-06-30 NOTE — Progress Notes (Signed)
Pt declined 30 min observation period.  Vitals obtained and remain stable as charted.  Pt discharged with no questions or complaints accompanied by husband.

## 2016-06-30 NOTE — Patient Instructions (Signed)

## 2016-07-28 ENCOUNTER — Ambulatory Visit (HOSPITAL_BASED_OUTPATIENT_CLINIC_OR_DEPARTMENT_OTHER): Payer: Medicare Other

## 2016-07-28 ENCOUNTER — Other Ambulatory Visit (HOSPITAL_BASED_OUTPATIENT_CLINIC_OR_DEPARTMENT_OTHER): Payer: Medicare Other

## 2016-07-28 VITALS — BP 130/91 | HR 78 | Temp 98.3°F | Resp 18

## 2016-07-28 DIAGNOSIS — D801 Nonfamilial hypogammaglobulinemia: Secondary | ICD-10-CM

## 2016-07-28 DIAGNOSIS — C9110 Chronic lymphocytic leukemia of B-cell type not having achieved remission: Secondary | ICD-10-CM | POA: Diagnosis not present

## 2016-07-28 LAB — CBC WITH DIFFERENTIAL/PLATELET
BASO%: 0.6 % (ref 0.0–2.0)
Basophils Absolute: 0.1 10*3/uL (ref 0.0–0.1)
EOS ABS: 0.1 10*3/uL (ref 0.0–0.5)
EOS%: 1.4 % (ref 0.0–7.0)
HCT: 43 % (ref 34.8–46.6)
HGB: 14.1 g/dL (ref 11.6–15.9)
LYMPH%: 52.7 % — AB (ref 14.0–49.7)
MCH: 29.5 pg (ref 25.1–34.0)
MCHC: 32.7 g/dL (ref 31.5–36.0)
MCV: 90.4 fL (ref 79.5–101.0)
MONO#: 0.8 10*3/uL (ref 0.1–0.9)
MONO%: 8 % (ref 0.0–14.0)
NEUT%: 37.3 % — ABNORMAL LOW (ref 38.4–76.8)
NEUTROS ABS: 3.7 10*3/uL (ref 1.5–6.5)
Platelets: 92 10*3/uL — ABNORMAL LOW (ref 145–400)
RBC: 4.76 10*6/uL (ref 3.70–5.45)
RDW: 13.4 % (ref 11.2–14.5)
WBC: 10 10*3/uL (ref 3.9–10.3)
lymph#: 5.3 10*3/uL — ABNORMAL HIGH (ref 0.9–3.3)

## 2016-07-28 LAB — COMPREHENSIVE METABOLIC PANEL
ALBUMIN: 3.9 g/dL (ref 3.5–5.0)
ALK PHOS: 47 U/L (ref 40–150)
ALT: 20 U/L (ref 0–55)
AST: 26 U/L (ref 5–34)
Anion Gap: 8 mEq/L (ref 3–11)
BUN: 14.3 mg/dL (ref 7.0–26.0)
CO2: 25 meq/L (ref 22–29)
Calcium: 10 mg/dL (ref 8.4–10.4)
Chloride: 106 mEq/L (ref 98–109)
Creatinine: 1 mg/dL (ref 0.6–1.1)
EGFR: 56 mL/min/{1.73_m2} — AB (ref >90)
GLUCOSE: 90 mg/dL (ref 70–140)
POTASSIUM: 4.9 meq/L (ref 3.5–5.1)
SODIUM: 139 meq/L (ref 136–145)
Total Bilirubin: 0.61 mg/dL (ref 0.20–1.20)
Total Protein: 7.2 g/dL (ref 6.4–8.3)

## 2016-07-28 LAB — TECHNOLOGIST REVIEW

## 2016-07-28 MED ORDER — SODIUM CHLORIDE 0.9% FLUSH
10.0000 mL | INTRAVENOUS | Status: AC | PRN
  Administered 2016-07-28: 10 mL
  Filled 2016-07-28: qty 10

## 2016-07-28 MED ORDER — METHYLPREDNISOLONE SODIUM SUCC 40 MG IJ SOLR
INTRAMUSCULAR | Status: AC
  Filled 2016-07-28: qty 1

## 2016-07-28 MED ORDER — DIPHENHYDRAMINE HCL 25 MG PO CAPS
ORAL_CAPSULE | ORAL | Status: AC
  Filled 2016-07-28: qty 1

## 2016-07-28 MED ORDER — METHYLPREDNISOLONE SODIUM SUCC 40 MG IJ SOLR
40.0000 mg | Freq: Once | INTRAMUSCULAR | Status: AC
  Administered 2016-07-28: 40 mg via INTRAVENOUS

## 2016-07-28 MED ORDER — IMMUNE GLOBULIN (HUMAN) 10 GM/100ML IV SOLN
50.0000 g | Freq: Once | INTRAVENOUS | Status: AC
  Administered 2016-07-28: 50 g via INTRAVENOUS
  Filled 2016-07-28: qty 400

## 2016-07-28 MED ORDER — DIPHENHYDRAMINE HCL 25 MG PO TABS
25.0000 mg | ORAL_TABLET | Freq: Once | ORAL | Status: AC
Start: 2016-07-28 — End: 2016-07-28
  Administered 2016-07-28: 25 mg via ORAL
  Filled 2016-07-28: qty 1

## 2016-07-28 MED ORDER — ACETAMINOPHEN 325 MG PO TABS
650.0000 mg | ORAL_TABLET | Freq: Once | ORAL | Status: AC
  Administered 2016-07-28: 650 mg via ORAL

## 2016-07-28 MED ORDER — HEPARIN SOD (PORK) LOCK FLUSH 100 UNIT/ML IV SOLN
500.0000 [IU] | INTRAVENOUS | Status: AC | PRN
  Administered 2016-07-28: 500 [IU]
  Filled 2016-07-28: qty 5

## 2016-07-28 MED ORDER — ACETAMINOPHEN 325 MG PO TABS
ORAL_TABLET | ORAL | Status: AC
  Filled 2016-07-28: qty 2

## 2016-07-28 NOTE — Patient Instructions (Signed)

## 2016-07-28 NOTE — Progress Notes (Signed)
Patient declines 30 minute observation post-transfusion.

## 2016-08-19 ENCOUNTER — Other Ambulatory Visit: Payer: Self-pay | Admitting: Neurology

## 2016-08-25 ENCOUNTER — Ambulatory Visit (HOSPITAL_BASED_OUTPATIENT_CLINIC_OR_DEPARTMENT_OTHER): Payer: Medicare Other | Admitting: Hematology and Oncology

## 2016-08-25 ENCOUNTER — Encounter: Payer: Self-pay | Admitting: Hematology and Oncology

## 2016-08-25 ENCOUNTER — Ambulatory Visit (HOSPITAL_BASED_OUTPATIENT_CLINIC_OR_DEPARTMENT_OTHER): Payer: Medicare Other

## 2016-08-25 ENCOUNTER — Telehealth: Payer: Self-pay | Admitting: Hematology and Oncology

## 2016-08-25 ENCOUNTER — Other Ambulatory Visit (HOSPITAL_BASED_OUTPATIENT_CLINIC_OR_DEPARTMENT_OTHER): Payer: Medicare Other

## 2016-08-25 VITALS — BP 160/54 | HR 73 | Temp 98.1°F | Resp 17

## 2016-08-25 VITALS — BP 143/60 | HR 73 | Temp 97.3°F | Resp 18 | Ht 60.0 in | Wt 117.3 lb

## 2016-08-25 DIAGNOSIS — J0191 Acute recurrent sinusitis, unspecified: Secondary | ICD-10-CM | POA: Insufficient documentation

## 2016-08-25 DIAGNOSIS — D696 Thrombocytopenia, unspecified: Secondary | ICD-10-CM | POA: Diagnosis not present

## 2016-08-25 DIAGNOSIS — D801 Nonfamilial hypogammaglobulinemia: Secondary | ICD-10-CM

## 2016-08-25 DIAGNOSIS — C9110 Chronic lymphocytic leukemia of B-cell type not having achieved remission: Secondary | ICD-10-CM

## 2016-08-25 DIAGNOSIS — J0141 Acute recurrent pansinusitis: Secondary | ICD-10-CM | POA: Diagnosis not present

## 2016-08-25 LAB — CBC WITH DIFFERENTIAL/PLATELET
BASO%: 0.9 % (ref 0.0–2.0)
BASOS ABS: 0.1 10*3/uL (ref 0.0–0.1)
EOS%: 1.3 % (ref 0.0–7.0)
Eosinophils Absolute: 0.2 10*3/uL (ref 0.0–0.5)
HCT: 40 % (ref 34.8–46.6)
HGB: 13.6 g/dL (ref 11.6–15.9)
LYMPH%: 52.7 % — ABNORMAL HIGH (ref 14.0–49.7)
MCH: 30.4 pg (ref 25.1–34.0)
MCHC: 34 g/dL (ref 31.5–36.0)
MCV: 89.3 fL (ref 79.5–101.0)
MONO#: 1.9 10*3/uL — ABNORMAL HIGH (ref 0.1–0.9)
MONO%: 16 % — AB (ref 0.0–14.0)
NEUT#: 3.4 10*3/uL (ref 1.5–6.5)
NEUT%: 29.1 % — AB (ref 38.4–76.8)
Platelets: 77 10*3/uL — ABNORMAL LOW (ref 145–400)
RBC: 4.48 10*6/uL (ref 3.70–5.45)
RDW: 13.8 % (ref 11.2–14.5)
WBC: 11.7 10*3/uL — ABNORMAL HIGH (ref 3.9–10.3)
lymph#: 6.2 10*3/uL — ABNORMAL HIGH (ref 0.9–3.3)
nRBC: 0 % (ref 0–0)

## 2016-08-25 LAB — COMPREHENSIVE METABOLIC PANEL
ALBUMIN: 3.8 g/dL (ref 3.5–5.0)
ALK PHOS: 47 U/L (ref 40–150)
ALT: 16 U/L (ref 0–55)
AST: 26 U/L (ref 5–34)
Anion Gap: 8 mEq/L (ref 3–11)
BUN: 12.6 mg/dL (ref 7.0–26.0)
CO2: 26 meq/L (ref 22–29)
Calcium: 9.9 mg/dL (ref 8.4–10.4)
Chloride: 104 mEq/L (ref 98–109)
Creatinine: 1 mg/dL (ref 0.6–1.1)
EGFR: 56 mL/min/{1.73_m2} — AB (ref >90)
GLUCOSE: 79 mg/dL (ref 70–140)
POTASSIUM: 4.8 meq/L (ref 3.5–5.1)
SODIUM: 137 meq/L (ref 136–145)
Total Bilirubin: 0.46 mg/dL (ref 0.20–1.20)
Total Protein: 7.2 g/dL (ref 6.4–8.3)

## 2016-08-25 LAB — TECHNOLOGIST REVIEW

## 2016-08-25 MED ORDER — ACETAMINOPHEN 325 MG PO TABS
650.0000 mg | ORAL_TABLET | Freq: Once | ORAL | Status: AC
  Administered 2016-08-25: 650 mg via ORAL

## 2016-08-25 MED ORDER — HEPARIN SOD (PORK) LOCK FLUSH 100 UNIT/ML IV SOLN
500.0000 [IU] | Freq: Once | INTRAVENOUS | Status: AC
  Administered 2016-08-25: 500 [IU] via INTRAVENOUS
  Filled 2016-08-25: qty 5

## 2016-08-25 MED ORDER — ACETAMINOPHEN 325 MG PO TABS
ORAL_TABLET | ORAL | Status: AC
  Filled 2016-08-25: qty 2

## 2016-08-25 MED ORDER — AMOXICILLIN-POT CLAVULANATE 875-125 MG PO TABS: 1.0000 | ORAL_TABLET | Freq: Two times a day (BID) | ORAL | 0 refills | Status: DC

## 2016-08-25 MED ORDER — SODIUM CHLORIDE 0.9% FLUSH
10.0000 mL | INTRAVENOUS | Status: DC | PRN
  Administered 2016-08-25: 10 mL via INTRAVENOUS
  Filled 2016-08-25: qty 10

## 2016-08-25 MED ORDER — IMMUNE GLOBULIN (HUMAN) 10 GM/100ML IV SOLN
50.0000 g | Freq: Once | INTRAVENOUS | Status: AC
  Administered 2016-08-25: 50 g via INTRAVENOUS
  Filled 2016-08-25: qty 100

## 2016-08-25 MED ORDER — METHYLPREDNISOLONE SODIUM SUCC 40 MG IJ SOLR
INTRAMUSCULAR | Status: AC
  Filled 2016-08-25: qty 1

## 2016-08-25 MED ORDER — PREDNISONE 20 MG PO TABS: 20.0000 mg | ORAL_TABLET | Freq: Every day | ORAL | 0 refills | Status: DC

## 2016-08-25 MED ORDER — METHYLPREDNISOLONE SODIUM SUCC 40 MG IJ SOLR
40.0000 mg | Freq: Once | INTRAMUSCULAR | Status: AC
  Administered 2016-08-25: 40 mg via INTRAVENOUS

## 2016-08-25 MED ORDER — DIPHENHYDRAMINE HCL 25 MG PO TABS
25.0000 mg | ORAL_TABLET | Freq: Once | ORAL | Status: AC
  Administered 2016-08-25: 25 mg via ORAL
  Filled 2016-08-25: qty 1

## 2016-08-25 MED ORDER — ACETAMINOPHEN 500 MG PO TABS
ORAL_TABLET | ORAL | Status: AC
  Filled 2016-08-25: qty 2

## 2016-08-25 MED ORDER — DIPHENHYDRAMINE HCL 25 MG PO CAPS
ORAL_CAPSULE | ORAL | Status: AC
  Filled 2016-08-25: qty 1

## 2016-08-25 NOTE — Progress Notes (Signed)
[facility] OFFICE PROGRESS NOTE  Patient Care Team: Shon Baton, MD as PCP - General (Internal Medicine) Heath Lark, MD as Consulting Physician (Hematology and Oncology) Eustace Quail Tat, DO as Consulting Physician (Neurology)  SUMMARY OF ONCOLOGIC HISTORY: Oncology History   CLL stage IV, deletion 13q     CLL (chronic lymphocytic leukemia) ([place])   12/12/2013 Bone Marrow Biopsy    Bone marrow biopsy show significant involvement of CLL      12/13/2013 Procedure    Patient had placement of a port      12/14/2013 Imaging    CT scan of the chest, abdomen and pelvis showed significant lymphadenopathy and splenomegaly      12/20/2013 - 12/22/2013 Hospital Admission    The patient was admitted to the hospital to begin cycle 1 day 1 of chemotherapy due to risk of infusion reaction. She tolerated treatment well without major side effects.      12/20/2013 - 03/13/2014 Chemotherapy    started on cycle 1 of Obinutuzumab, chlorambucil and prednisone. All treatment placed on hold on 03/13/2014 due to persistent pancytopenia.      03/28/2014 Imaging    Repeat imaging study showed excellent response to treatment.      10/25/2014 Imaging    She has persistent mild lymphadenopathy and splenomegaly, improved compared to previous exam      04/23/2015 Imaging    Ct scan showed disease progression      05/09/2015 - 11/26/2015 Chemotherapy    She started taking ibrutinib.      11/26/2015 Adverse Reaction    Treatment  was placed on hold due to neutropenia and recent infection      02/11/2016 -  Chemotherapy    She is started on monthly IVIG Rx for recurrent infection and acquired panhypogammaglobulinemia       INTERVAL HISTORY: Please see below for problem oriented charting. She returns today with her husband for IVIG treatment. For the past 5 days, she have recurrent sinus infection with drainage but denies recent fever. She have significant sinus discharge which is getting  doctor. She felt miserable again. No new lymphadenopathy. The patient denies any recent signs or symptoms of bleeding such as spontaneous epistaxis, hematuria or hematochezia. Appetite is stable. She has no recent weight loss. She has completed recent bilateral cataract surgeries  REVIEW OF SYSTEMS:   Constitutional: Denies fevers, chills or abnormal weight loss Eyes: Denies blurriness of vision Ears, nose, mouth, throat, and face: Denies mucositis or sore throat Respiratory: Denies cough, dyspnea or wheezes Cardiovascular: Denies palpitation, chest discomfort or lower extremity swelling Gastrointestinal:  Denies nausea, heartburn or change in bowel habits Skin: Denies abnormal skin rashes Lymphatics: Denies new lymphadenopathy or easy bruising Neurological:Denies numbness, tingling or new weaknesses Behavioral/Psych: Mood is stable, no new changes  All other systems were reviewed with the patient and are negative.  I have reviewed the past medical history, past surgical history, social history and family history with the patient and they are unchanged from previous note.  ALLERGIES:  is allergic to bactrim [sulfamethoxazole w/trimethoprim (co-trimoxazole)].  MEDICATIONS:  Current Outpatient Prescriptions  Medication Sig Dispense Refill  . acyclovir (ZOVIRAX) 400 MG tablet Take 1 tablet (400 mg total) by mouth daily. 90 tablet 3  . atorvastatin (LIPITOR) 20 MG tablet Take 20 mg by mouth every evening.     . calcium citrate-vitamin D (CITRACAL+D) 315-200 MG-UNIT per tablet Take 1 tablet by mouth daily.     . Cholecalciferol (VITAMIN D) 400 UNITS  capsule Take 1,000 Units by mouth at bedtime.     . diphenhydrAMINE (BENADRYL) 25 mg capsule Take 25 mg by mouth at bedtime as needed for allergies (allergies).     . Echinacea 400 MG CAPS Take 1 capsule by mouth 2 (two) times daily.      Marland Kitchen ESTRACE VAGINAL 0.1 MG/GM vaginal cream Place 1 Applicatorful vaginally 2 (two) times a week.     Marland Kitchen  glucosamine-chondroitin 500-400 MG tablet Take 1 tablet by mouth daily.      Marland Kitchen levETIRAcetam (KEPPRA XR) 500 MG 24 hr tablet TAKE 2 TABLETS BY MOUTH  DAILY 180 tablet 1  . loratadine (CLARITIN) 10 MG tablet Take 10 mg by mouth daily.      . montelukast (SINGULAIR) 10 MG tablet Take 10 mg by mouth daily.     . Multiple Vitamin (MULTIVITAMIN) tablet Take 1 tablet by mouth daily.      . polyvinyl alcohol (ARTIFICIAL TEARS) 1.4 % ophthalmic solution Place 1 drop into both eyes 3 (three) times daily as needed for dry eyes (dry eyes). Reported on 03/10/2016    . sodium chloride (OCEAN) 0.65 % SOLN nasal spray Place 1 spray into both nostrils 2 (two) times daily.     Marland Kitchen amoxicillin-clavulanate (AUGMENTIN) 875-125 MG tablet Take 1 tablet by mouth 2 (two) times daily. 14 tablet 0  . predniSONE (DELTASONE) 20 MG tablet Take 1 tablet (20 mg total) by mouth daily with breakfast. 7 tablet 0   No current facility-administered medications for this visit.    Facility-Administered Medications Ordered in Other Visits  Medication Dose Route Frequency Provider Last Rate Last Dose  . Immune Globulin 10% (PRIVIGEN) IV infusion 50 g  50 g Intravenous Once Heath Lark, MD        PHYSICAL EXAMINATION: ECOG PERFORMANCE STATUS: 1 - Symptomatic but completely ambulatory  Vitals:   08/25/16 0951  BP: (!) 143/60  Pulse: 73  Resp: 18  Temp: 97.3 F (36.3 C)   Filed Weights   08/25/16 0951  Weight: 117 lb 4.8 oz (53.2 kg)    GENERAL:alert, no distress and comfortable SKIN: skin color, texture, turgor are normal, no rashes or significant lesions EYES: She has bilateral red eyes from recent cataract surgery OROPHARYNX:no exudate, no erythema and lips, buccal mucosa, and tongue normal  NECK: supple, thyroid normal size, non-tender, without nodularity LYMPH:  no palpable lymphadenopathy in the cervical, axillary or inguinal LUNGS: clear to auscultation and percussion with normal breathing effort HEART: regular rate  & rhythm and no murmurs and no lower extremity edema ABDOMEN:abdomen soft, non-tender and normal bowel sounds Musculoskeletal:no cyanosis of digits and no clubbing  NEURO: alert & oriented x 3 with fluent speech, no focal motor/sensory deficits  LABORATORY DATA:  I have reviewed the data as listed    Component Value Date/Time   NA 137 08/25/2016 0924   K 4.8 08/25/2016 0924   CL 109 05/13/2016 1238   CL 99 04/08/2013 0922   CO2 26 08/25/2016 0924   GLUCOSE 79 08/25/2016 0924   GLUCOSE 92 04/08/2013 0922   BUN 12.6 08/25/2016 0924   CREATININE 1.0 08/25/2016 0924   CALCIUM 9.9 08/25/2016 0924   PROT 7.2 08/25/2016 0924   ALBUMIN 3.8 08/25/2016 0924   AST 26 08/25/2016 0924   ALT 16 08/25/2016 0924   ALKPHOS 47 08/25/2016 0924   BILITOT 0.46 08/25/2016 0924   GFRNONAA 53 (L) 05/13/2016 1238   GFRAA >60 05/13/2016 1238    No results found  for: SPEP, UPEP  Lab Results  Component Value Date   WBC 11.7 (H) 08/25/2016   NEUTROABS 3.4 08/25/2016   HGB 13.6 08/25/2016   HCT 40.0 08/25/2016   MCV 89.3 08/25/2016   PLT 77 (L) 08/25/2016      Chemistry      Component Value Date/Time   NA 137 08/25/2016 0924   K 4.8 08/25/2016 0924   CL 109 05/13/2016 1238   CL 99 04/08/2013 0922   CO2 26 08/25/2016 0924   BUN 12.6 08/25/2016 0924   CREATININE 1.0 08/25/2016 0924      Component Value Date/Time   CALCIUM 9.9 08/25/2016 0924   ALKPHOS 47 08/25/2016 0924   AST 26 08/25/2016 0924   ALT 16 08/25/2016 0924   BILITOT 0.46 08/25/2016 0924       ASSESSMENT & PLAN:  CLL (chronic lymphocytic leukemia) The absolute lymphocyte count is trending up. She remain asymptomatic. I will continue monthly blood work monitoring. I will see her back in 2 months and if she continues to have progressive thrombocytopenia, I may have to restart her treatment for CLL  Hypogammaglobulinemia, acquired ([place]) Repeat immunoglobulin levels were very low, related to CLL and recent  treatment. Since we started her on IVIG treatment, she has not had recent infection until last week  I will recheck her blood work and continue monthly treatment  Thrombocytopenia ([place]) She has intermittent fluctuation of thrombocytopenia. It could be related to CLL, ITP, or bone marrow suppression from recurrent infection. She is not symptomatic apart from minor bruising. I will observe for now. I will continue to monitor CBC on the monthly basis and we will consider further evaluation if her platelet count dropped to closer to 50,000. Her CBC seemed a little worse likely related to recent infection causing overall bone marrow suppression. I plan to recheck serum vitamin B-12 in the next visit   Acute recurrent sinusitis She has recurrent acute sinus infection. Blood count showed monocytosis and she has been symptomatic since last week. I will give her 1 week course of Augmentin along with low-dose prednisone. She will continue her over-the-counter remedies for allergies.   Orders Placed This Encounter  Procedures  . IgG, IgA, IgM  . Vitamin B12    Standing Status:   Future    Standing Expiration Date:   09/29/2017   All questions were answered. The patient knows to call the clinic with any problems, questions or concerns. No barriers to learning was detected. I spent 25 minutes counseling the patient face to face. The total time spent in the appointment was 40 minutes and more than 50% was on counseling and review of test results     Heath Lark, MD 08/25/2016 11:03 AM

## 2016-08-25 NOTE — Assessment & Plan Note (Signed)
Repeat immunoglobulin levels were very low, related to CLL and recent treatment. Since we started her on IVIG treatment, she has not had recent infection until last week  I will recheck her blood work and continue monthly treatment

## 2016-08-25 NOTE — Progress Notes (Signed)
Pt inquired about her billing today.  She has assistance with LLS.  I emailed Raquel Sarna in the billing department to see if her drugs have been submitted to LLS to see if they will cover IVIG.  Waiting for response.

## 2016-08-25 NOTE — Assessment & Plan Note (Signed)
The absolute lymphocyte count is trending up. She remain asymptomatic. I will continue monthly blood work monitoring. I will see her back in 2 months and if she continues to have progressive thrombocytopenia, I may have to restart her treatment for CLL

## 2016-08-25 NOTE — Progress Notes (Signed)
Back in July 2017 pt told me she had copay assistance w/ LLS but it was actually PAN so today I applied to LLS in her behalf.  Once pt & Dr signs the application I will fax to [place] for processing in hopes she can get assistance for Priv[employer].

## 2016-08-25 NOTE — Assessment & Plan Note (Signed)
She has intermittent fluctuation of thrombocytopenia. It could be related to CLL, ITP, or bone marrow suppression from recurrent infection. She is not symptomatic apart from minor bruising. I will observe for now. I will continue to monitor CBC on the monthly basis and we will consider further evaluation if her platelet count dropped to closer to 50,000. Her CBC seemed a little worse likely related to recent infection causing overall bone marrow suppression. I plan to recheck serum vitamin B-12 in the next visit

## 2016-08-25 NOTE — Patient Instructions (Signed)

## 2016-08-25 NOTE — Telephone Encounter (Signed)
Message sent to chemo scheduler to add chemo. AVS report and appointment schedule, given to patient per 08/25/16 los.

## 2016-08-25 NOTE — Assessment & Plan Note (Signed)
She has recurrent acute sinus infection. Blood count showed monocytosis and she has been symptomatic since last week. I will give her 1 week course of Augmentin along with low-dose prednisone. She will continue her over-the-counter remedies for allergies.

## 2016-08-26 ENCOUNTER — Encounter: Payer: Self-pay | Admitting: Hematology and Oncology

## 2016-08-26 ENCOUNTER — Telehealth: Payer: Self-pay | Admitting: *Deleted

## 2016-08-26 LAB — IGG, IGA, IGM
IGA/IMMUNOGLOBULIN A, SERUM: 22 mg/dL — AB (ref 64–422)
IgG, Qn, Serum: 1204 mg/dL (ref 700–1600)
IgM, Qn, Serum: <5 mg/dL — ABNORMAL LOW (ref 26–217)

## 2016-08-26 NOTE — Telephone Encounter (Signed)
Per LOS I have scheduled appts and notified the scheduler 

## 2016-08-26 NOTE — Progress Notes (Signed)
Faxed signed LLS application today.  Waiting for approval.

## 2016-08-28 ENCOUNTER — Encounter: Payer: Self-pay | Admitting: Hematology and Oncology

## 2016-08-28 NOTE — Progress Notes (Signed)
Received approval from LLS. Patient approved for copay assistance for up to $5,000 08/03/16-08/02/17. Copy of approval letter and POE emailed to [place] in billing. A copy will be placed to be scanned by HIM.

## 2016-09-01 ENCOUNTER — Telehealth: Payer: Self-pay | Admitting: *Deleted

## 2016-09-01 NOTE — Telephone Encounter (Signed)
Pt had left VM asking if she can drop off forms for assistance from the [employer].  I called pt back and she says she already found out that [place], Music therapist, had already taken care of forms and she is approved for assistance.  No further needs at this time.

## 2016-09-22 ENCOUNTER — Ambulatory Visit (HOSPITAL_BASED_OUTPATIENT_CLINIC_OR_DEPARTMENT_OTHER): Payer: Medicare Other

## 2016-09-22 ENCOUNTER — Other Ambulatory Visit (HOSPITAL_BASED_OUTPATIENT_CLINIC_OR_DEPARTMENT_OTHER): Payer: Medicare Other

## 2016-09-22 VITALS — BP 143/66 | HR 79 | Temp 97.7°F | Resp 16

## 2016-09-22 DIAGNOSIS — C9110 Chronic lymphocytic leukemia of B-cell type not having achieved remission: Secondary | ICD-10-CM

## 2016-09-22 DIAGNOSIS — D801 Nonfamilial hypogammaglobulinemia: Secondary | ICD-10-CM | POA: Diagnosis not present

## 2016-09-22 DIAGNOSIS — D696 Thrombocytopenia, unspecified: Secondary | ICD-10-CM

## 2016-09-22 LAB — CBC WITH DIFFERENTIAL/PLATELET
BASO%: 0.9 % (ref 0.0–2.0)
Basophils Absolute: 0.1 10*3/uL (ref 0.0–0.1)
EOS ABS: 0.3 10*3/uL (ref 0.0–0.5)
EOS%: 2.8 % (ref 0.0–7.0)
HCT: 38 % (ref 34.8–46.6)
HGB: 12.9 g/dL (ref 11.6–15.9)
LYMPH%: 49.8 % — AB (ref 14.0–49.7)
MCH: 30.4 pg (ref 25.1–34.0)
MCHC: 33.9 g/dL (ref 31.5–36.0)
MCV: 89.4 fL (ref 79.5–101.0)
MONO#: 1.3 10*3/uL — AB (ref 0.1–0.9)
MONO%: 12.8 % (ref 0.0–14.0)
NEUT%: 33.7 % — ABNORMAL LOW (ref 38.4–76.8)
NEUTROS ABS: 3.4 10*3/uL (ref 1.5–6.5)
NRBC: 0 % (ref 0–0)
Platelets: 81 10*3/uL — ABNORMAL LOW (ref 145–400)
RBC: 4.25 10*6/uL (ref 3.70–5.45)
RDW: 13.8 % (ref 11.2–14.5)
WBC: 10.2 10*3/uL (ref 3.9–10.3)
lymph#: 5.1 10*3/uL — ABNORMAL HIGH (ref 0.9–3.3)

## 2016-09-22 LAB — TECHNOLOGIST REVIEW

## 2016-09-22 LAB — COMPREHENSIVE METABOLIC PANEL
ALT: 15 U/L (ref 0–55)
AST: 26 U/L (ref 5–34)
Albumin: 3.6 g/dL (ref 3.5–5.0)
Alkaline Phosphatase: 46 U/L (ref 40–150)
Anion Gap: 8 mEq/L (ref 3–11)
BILIRUBIN TOTAL: 0.52 mg/dL (ref 0.20–1.20)
BUN: 9.6 mg/dL (ref 7.0–26.0)
CHLORIDE: 108 meq/L (ref 98–109)
CO2: 23 meq/L (ref 22–29)
CREATININE: 0.8 mg/dL (ref 0.6–1.1)
Calcium: 10 mg/dL (ref 8.4–10.4)
EGFR: 66 mL/min/{1.73_m2} — ABNORMAL LOW (ref >90)
GLUCOSE: 88 mg/dL (ref 70–140)
Potassium: 4.1 mEq/L (ref 3.5–5.1)
Sodium: 139 mEq/L (ref 136–145)
TOTAL PROTEIN: 7 g/dL (ref 6.4–8.3)

## 2016-09-22 MED ORDER — SODIUM CHLORIDE 0.9% FLUSH
10.0000 mL | INTRAVENOUS | Status: DC | PRN
  Administered 2016-09-22: 10 mL via INTRAVENOUS
  Filled 2016-09-22: qty 10

## 2016-09-22 MED ORDER — HEPARIN SOD (PORK) LOCK FLUSH 100 UNIT/ML IV SOLN
500.0000 [IU] | Freq: Once | INTRAVENOUS | Status: AC
  Administered 2016-09-22: 500 [IU] via INTRAVENOUS
  Filled 2016-09-22: qty 5

## 2016-09-22 MED ORDER — ACETAMINOPHEN 325 MG PO TABS
ORAL_TABLET | ORAL | Status: AC
  Filled 2016-09-22: qty 2

## 2016-09-22 MED ORDER — METHYLPREDNISOLONE SODIUM SUCC 40 MG IJ SOLR
INTRAMUSCULAR | Status: AC
  Filled 2016-09-22: qty 1

## 2016-09-22 MED ORDER — DIPHENHYDRAMINE HCL 25 MG PO TABS
25.0000 mg | ORAL_TABLET | Freq: Once | ORAL | Status: AC
  Administered 2016-09-22: 25 mg via ORAL
  Filled 2016-09-22: qty 1

## 2016-09-22 MED ORDER — IMMUNE GLOBULIN (HUMAN) 10 GM/100ML IV SOLN
50.0000 g | Freq: Once | INTRAVENOUS | Status: AC
  Administered 2016-09-22: 50 g via INTRAVENOUS
  Filled 2016-09-22: qty 400

## 2016-09-22 MED ORDER — ACETAMINOPHEN 325 MG PO TABS
650.0000 mg | ORAL_TABLET | Freq: Once | ORAL | Status: AC
  Administered 2016-09-22: 650 mg via ORAL

## 2016-09-22 MED ORDER — METHYLPREDNISOLONE SODIUM SUCC 40 MG IJ SOLR
40.0000 mg | Freq: Once | INTRAMUSCULAR | Status: AC
  Administered 2016-09-22: 40 mg via INTRAVENOUS

## 2016-09-22 MED ORDER — DIPHENHYDRAMINE HCL 25 MG PO CAPS
ORAL_CAPSULE | ORAL | Status: AC
  Filled 2016-09-22: qty 1

## 2016-09-22 MED ORDER — DEXTROSE 5 % IV SOLN
Freq: Once | INTRAVENOUS | Status: AC
  Administered 2016-09-22: 09:00:00 via INTRAVENOUS

## 2016-09-22 NOTE — Patient Instructions (Signed)

## 2016-09-23 LAB — VITAMIN B12: VITAMIN B 12: 441 pg/mL (ref 211–946)

## 2016-10-20 ENCOUNTER — Telehealth: Payer: Self-pay | Admitting: Hematology and Oncology

## 2016-10-20 ENCOUNTER — Ambulatory Visit (HOSPITAL_BASED_OUTPATIENT_CLINIC_OR_DEPARTMENT_OTHER): Payer: Medicare Other | Admitting: Hematology and Oncology

## 2016-10-20 ENCOUNTER — Encounter: Payer: Self-pay | Admitting: Hematology and Oncology

## 2016-10-20 ENCOUNTER — Ambulatory Visit (HOSPITAL_BASED_OUTPATIENT_CLINIC_OR_DEPARTMENT_OTHER): Payer: Medicare Other

## 2016-10-20 ENCOUNTER — Other Ambulatory Visit (HOSPITAL_BASED_OUTPATIENT_CLINIC_OR_DEPARTMENT_OTHER): Payer: Medicare Other

## 2016-10-20 ENCOUNTER — Ambulatory Visit: Payer: Medicare Other

## 2016-10-20 VITALS — BP 128/42 | HR 79 | Temp 97.9°F | Resp 17 | Ht 60.0 in | Wt 116.8 lb

## 2016-10-20 VITALS — BP 142/54 | HR 70 | Temp 96.9°F | Resp 18

## 2016-10-20 DIAGNOSIS — D801 Nonfamilial hypogammaglobulinemia: Secondary | ICD-10-CM

## 2016-10-20 DIAGNOSIS — D696 Thrombocytopenia, unspecified: Secondary | ICD-10-CM | POA: Diagnosis not present

## 2016-10-20 DIAGNOSIS — J0141 Acute recurrent pansinusitis: Secondary | ICD-10-CM | POA: Diagnosis not present

## 2016-10-20 DIAGNOSIS — Z95828 Presence of other vascular implants and grafts: Secondary | ICD-10-CM

## 2016-10-20 DIAGNOSIS — C9110 Chronic lymphocytic leukemia of B-cell type not having achieved remission: Secondary | ICD-10-CM | POA: Diagnosis not present

## 2016-10-20 LAB — COMPREHENSIVE METABOLIC PANEL
ALT: 17 U/L (ref 0–55)
AST: 27 U/L (ref 5–34)
Albumin: 3.7 g/dL (ref 3.5–5.0)
Alkaline Phosphatase: 41 U/L (ref 40–150)
Anion Gap: 8 mEq/L (ref 3–11)
BUN: 15.3 mg/dL (ref 7.0–26.0)
CALCIUM: 9.6 mg/dL (ref 8.4–10.4)
CHLORIDE: 107 meq/L (ref 98–109)
CO2: 24 mEq/L (ref 22–29)
CREATININE: 1 mg/dL (ref 0.6–1.1)
EGFR: 51 mL/min/{1.73_m2} — ABNORMAL LOW (ref >90)
GLUCOSE: 167 mg/dL — AB (ref 70–140)
Potassium: 4 mEq/L (ref 3.5–5.1)
SODIUM: 139 meq/L (ref 136–145)
Total Bilirubin: 0.59 mg/dL (ref 0.20–1.20)
Total Protein: 6.7 g/dL (ref 6.4–8.3)

## 2016-10-20 LAB — CBC WITH DIFFERENTIAL/PLATELET
BASO%: 0.9 % (ref 0.0–2.0)
Basophils Absolute: 0.1 10*3/uL (ref 0.0–0.1)
EOS%: 2.1 % (ref 0.0–7.0)
Eosinophils Absolute: 0.3 10*3/uL (ref 0.0–0.5)
HEMATOCRIT: 37.9 % (ref 34.8–46.6)
HGB: 12.7 g/dL (ref 11.6–15.9)
LYMPH#: 7.6 10*3/uL — AB (ref 0.9–3.3)
LYMPH%: 62.1 % — ABNORMAL HIGH (ref 14.0–49.7)
MCH: 30.2 pg (ref 25.1–34.0)
MCHC: 33.5 g/dL (ref 31.5–36.0)
MCV: 90.2 fL (ref 79.5–101.0)
MONO#: 1.6 10*3/uL — ABNORMAL HIGH (ref 0.1–0.9)
MONO%: 13.2 % (ref 0.0–14.0)
NEUT#: 2.7 10*3/uL (ref 1.5–6.5)
NEUT%: 21.7 % — AB (ref 38.4–76.8)
Platelets: 72 10*3/uL — ABNORMAL LOW (ref 145–400)
RBC: 4.2 10*6/uL (ref 3.70–5.45)
RDW: 13.7 % (ref 11.2–14.5)
WBC: 12.2 10*3/uL — ABNORMAL HIGH (ref 3.9–10.3)

## 2016-10-20 LAB — TECHNOLOGIST REVIEW

## 2016-10-20 MED ORDER — METHYLPREDNISOLONE SODIUM SUCC 40 MG IJ SOLR
INTRAMUSCULAR | Status: AC
  Filled 2016-10-20: qty 1

## 2016-10-20 MED ORDER — DEXTROSE 5 % IV SOLN
INTRAVENOUS | Status: DC
  Administered 2016-10-20: 10:00:00 via INTRAVENOUS

## 2016-10-20 MED ORDER — METHYLPREDNISOLONE SODIUM SUCC 40 MG IJ SOLR
40.0000 mg | Freq: Once | INTRAMUSCULAR | Status: AC
  Administered 2016-10-20: 40 mg via INTRAVENOUS

## 2016-10-20 MED ORDER — ALTEPLASE 2 MG IJ SOLR
2.0000 mg | Freq: Once | INTRAMUSCULAR | Status: DC | PRN
  Filled 2016-10-20: qty 2

## 2016-10-20 MED ORDER — DIPHENHYDRAMINE HCL 25 MG PO TABS
25.0000 mg | ORAL_TABLET | Freq: Once | ORAL | Status: AC
  Administered 2016-10-20: 25 mg via ORAL
  Filled 2016-10-20: qty 1

## 2016-10-20 MED ORDER — IMMUNE GLOBULIN (HUMAN) 10 GM/100ML IV SOLN
50.0000 g | Freq: Once | INTRAVENOUS | Status: AC
  Administered 2016-10-20: 50 g via INTRAVENOUS
  Filled 2016-10-20: qty 400

## 2016-10-20 MED ORDER — HEPARIN SOD (PORK) LOCK FLUSH 100 UNIT/ML IV SOLN
500.0000 [IU] | Freq: Once | INTRAVENOUS | Status: AC
  Administered 2016-10-20: 500 [IU] via INTRAVENOUS
  Filled 2016-10-20: qty 5

## 2016-10-20 MED ORDER — ACETAMINOPHEN 325 MG PO TABS
ORAL_TABLET | ORAL | Status: AC
  Filled 2016-10-20: qty 2

## 2016-10-20 MED ORDER — SODIUM CHLORIDE 0.9% FLUSH
10.0000 mL | INTRAVENOUS | Status: DC | PRN
  Administered 2016-10-20: 10 mL via INTRAVENOUS
  Filled 2016-10-20: qty 10

## 2016-10-20 MED ORDER — ACETAMINOPHEN 325 MG PO TABS
650.0000 mg | ORAL_TABLET | Freq: Once | ORAL | Status: AC
  Administered 2016-10-20: 650 mg via ORAL

## 2016-10-20 MED ORDER — DIPHENHYDRAMINE HCL 25 MG PO CAPS
ORAL_CAPSULE | ORAL | Status: AC
  Filled 2016-10-20: qty 1

## 2016-10-20 NOTE — Assessment & Plan Note (Signed)
She is not symptomatic apart from minor bruising. I will observe for now. This is likely due to disease progression Continue close monitoring for now and repeat imaging as above

## 2016-10-20 NOTE — Patient Instructions (Signed)

## 2016-10-20 NOTE — Assessment & Plan Note (Signed)
Repeat immunoglobulin levels were very low, related to CLL and recent treatment. Her immunoglobulin levels remain low She will continue IVIG monthly and I will reassess for disease status next week.

## 2016-10-20 NOTE — Progress Notes (Signed)
[facility] OFFICE PROGRESS NOTE  Patient Care Team: Shon Baton, MD as PCP - General (Internal Medicine) Heath Lark, MD as Consulting Physician (Hematology and Oncology) Eustace Quail Tat, DO as Consulting Physician (Neurology)  SUMMARY OF ONCOLOGIC HISTORY: Oncology History   CLL stage IV, deletion 13q     CLL (chronic lymphocytic leukemia) ([place])   12/12/2013 Bone Marrow Biopsy    Bone marrow biopsy show significant involvement of CLL      12/13/2013 Procedure    Patient had placement of a port      12/14/2013 Imaging    CT scan of the chest, abdomen and pelvis showed significant lymphadenopathy and splenomegaly      12/20/2013 - 12/22/2013 Hospital Admission    The patient was admitted to the hospital to begin cycle 1 day 1 of chemotherapy due to risk of infusion reaction. She tolerated treatment well without major side effects.      12/20/2013 - 03/13/2014 Chemotherapy    started on cycle 1 of Obinutuzumab, chlorambucil and prednisone. All treatment placed on hold on 03/13/2014 due to persistent pancytopenia.      03/28/2014 Imaging    Repeat imaging study showed excellent response to treatment.      10/25/2014 Imaging    She has persistent mild lymphadenopathy and splenomegaly, improved compared to previous exam      04/23/2015 Imaging    Ct scan showed disease progression      05/09/2015 - 11/26/2015 Chemotherapy    She started taking ibrutinib.      11/26/2015 Adverse Reaction    Treatment  was placed on hold due to neutropenia and recent infection      02/11/2016 -  Chemotherapy    She is started on monthly IVIG Rx for recurrent infection and acquired panhypogammaglobulinemia       INTERVAL HISTORY: Please see below for problem oriented charting. She returns today with her husband for IVIG treatment. She continues to have recurrent sinus congestion. Denies recent fever or chills. She complained of left upper quadrant discomfort recently She also  noticed new and progressive lymphadenopathy No changes in appetite or bowel habits The patient denies any recent signs or symptoms of bleeding such as spontaneous epistaxis, hematuria or hematochezia. She bruises easily  REVIEW OF SYSTEMS:   Constitutional: Denies fevers, chills or abnormal weight loss Eyes: Denies blurriness of vision Ears, nose, mouth, throat, and face: Denies mucositis or sore throat Respiratory: Denies cough, dyspnea or wheezes Cardiovascular: Denies palpitation, chest discomfort or lower extremity swelling Gastrointestinal:  Denies nausea, heartburn or change in bowel habits Skin: Denies abnormal skin rashes Neurological:Denies numbness, tingling or new weaknesses Behavioral/Psych: Mood is stable, no new changes  All other systems were reviewed with the patient and are negative.  I have reviewed the past medical history, past surgical history, social history and family history with the patient and they are unchanged from previous note.  ALLERGIES:  is allergic to bactrim [sulfamethoxazole w/trimethoprim (co-trimoxazole)].  MEDICATIONS:  Current Outpatient Prescriptions  Medication Sig Dispense Refill  . acyclovir (ZOVIRAX) 400 MG tablet Take 1 tablet (400 mg total) by mouth daily. 90 tablet 3  . atorvastatin (LIPITOR) 20 MG tablet Take 20 mg by mouth every evening.     . calcium citrate-vitamin D (CITRACAL+D) 315-200 MG-UNIT per tablet Take 1 tablet by mouth daily.     . Cholecalciferol (VITAMIN D) 400 UNITS capsule Take 1,000 Units by mouth at bedtime.     . diphenhydrAMINE (BENADRYL) 25 mg capsule  Take 50 mg by mouth at bedtime as needed for allergies (allergies).     . Echinacea 400 MG CAPS Take 1 capsule by mouth 2 (two) times daily.      Marland Kitchen ESTRACE VAGINAL 0.1 MG/GM vaginal cream Place 1 Applicatorful vaginally 2 (two) times a week.     . folic acid (FOLVITE) 809 MCG tablet Take 400 mcg by mouth daily.    Marland Kitchen glucosamine-chondroitin 500-400 MG tablet Take 1  tablet by mouth daily.      Marland Kitchen levETIRAcetam (KEPPRA XR) 500 MG 24 hr tablet TAKE 2 TABLETS BY MOUTH  DAILY 180 tablet 1  . loratadine (CLARITIN) 10 MG tablet Take 10 mg by mouth daily.      . montelukast (SINGULAIR) 10 MG tablet Take 10 mg by mouth daily.     . Multiple Vitamin (MULTIVITAMIN) tablet Take 1 tablet by mouth daily.      . polyvinyl alcohol (ARTIFICIAL TEARS) 1.4 % ophthalmic solution Place 1 drop into both eyes 3 (three) times daily as needed for dry eyes (dry eyes). Reported on 03/10/2016    . sodium chloride (OCEAN) 0.65 % SOLN nasal spray Place 1 spray into both nostrils 2 (two) times daily.      No current facility-administered medications for this visit.     PHYSICAL EXAMINATION: ECOG PERFORMANCE STATUS: 1 - Symptomatic but completely ambulatory  Vitals:   10/20/16 0830  BP: (!) 128/42  Pulse: 79  Resp: 17  Temp: 97.9 F (36.6 C)   Filed Weights   10/20/16 0830  Weight: 116 lb 12.8 oz (53 kg)    GENERAL:alert, no distress and comfortable SKIN: skin color, texture, turgor are normal, no rashes or significant lesions. Noted minor skin bruising EYES: normal, Conjunctiva are pink and non-injected, sclera clear OROPHARYNX:no exudate, no erythema and lips, buccal mucosa, and tongue normal  NECK: supple, thyroid normal size, non-tender, without nodularity LYMPH:  She has palpable bilateral lymphadenopathy in the cervical and occipital region  LUNGS: clear to auscultation and percussion with normal breathing effort HEART: regular rate & rhythm and no murmurs and no lower extremity edema ABDOMEN:abdomen soft, non-tender and normal bowel sounds. Mild splenomegaly Musculoskeletal:no cyanosis of digits and no clubbing  NEURO: alert & oriented x 3 with fluent speech, no focal motor/sensory deficits  LABORATORY DATA:  I have reviewed the data as listed    Component Value Date/Time   NA 139 10/20/2016 0808   K 4.0 10/20/2016 0808   CL 109 05/13/2016 1238   CL 99  04/08/2013 0922   CO2 24 10/20/2016 0808   GLUCOSE 167 (H) 10/20/2016 0808   GLUCOSE 92 04/08/2013 0922   BUN 15.3 10/20/2016 0808   CREATININE 1.0 10/20/2016 0808   CALCIUM 9.6 10/20/2016 0808   PROT 6.7 10/20/2016 0808   ALBUMIN 3.7 10/20/2016 0808   AST 27 10/20/2016 0808   ALT 17 10/20/2016 0808   ALKPHOS 41 10/20/2016 0808   BILITOT 0.59 10/20/2016 0808   GFRNONAA 53 (L) 05/13/2016 1238   GFRAA >60 05/13/2016 1238    No results found for: SPEP, UPEP  Lab Results  Component Value Date   WBC 12.2 (H) 10/20/2016   NEUTROABS 2.7 10/20/2016   HGB 12.7 10/20/2016   HCT 37.9 10/20/2016   MCV 90.2 10/20/2016   PLT 72 (L) 10/20/2016      Chemistry      Component Value Date/Time   NA 139 10/20/2016 0808   K 4.0 10/20/2016 0808   CL 109 05/13/2016  1238   CL 99 04/08/2013 0922   CO2 24 10/20/2016 0808   BUN 15.3 10/20/2016 0808   CREATININE 1.0 10/20/2016 0808      Component Value Date/Time   CALCIUM 9.6 10/20/2016 0808   ALKPHOS 41 10/20/2016 0808   AST 27 10/20/2016 0808   ALT 17 10/20/2016 0808   BILITOT 0.59 10/20/2016 0808      ASSESSMENT & PLAN:  CLL (chronic lymphocytic leukemia) Her blood work and examination confirmed disease relapse. She has mild palpable splenomegaly on exam, that could account for discomfort over the left flank region. I recommend repeat imaging study with PET CT scan to assess. My concern would be possible transformation to high-grade lymphoma I will see her back after the new year to review test results  Thrombocytopenia [facility]) She is not symptomatic apart from minor bruising. I will observe for now. This is likely due to disease progression Continue close monitoring for now and repeat imaging as above  Hypogammaglobulinemia, acquired ([place]) Repeat immunoglobulin levels were very low, related to CLL and recent treatment. Her immunoglobulin levels remain low She will continue IVIG monthly and I will reassess for disease status next  week.  Acute recurrent sinusitis She has history of recurrent sinusitis. She is currently on conservative management. Clinically, she does not appear to have bacterial infection. We will proceed with IVIG as above   Orders Placed This Encounter  Procedures  . NM PET Image Restag (PS) Skull Base To Thigh    Standing Status:   Future    Standing Expiration Date:   11/24/2017    Order Specific Question:   Reason for exam:    Answer:   staging lymphoma, recurrence    Order Specific Question:   Preferred imaging location?    Answer:   [facility]   All questions were answered. The patient knows to call the clinic with any problems, questions or concerns. No barriers to learning was detected. I spent 25 minutes counseling the patient face to face. The total time spent in the appointment was 45 minutes and more than 50% was on counseling and review of test results     Heath Lark, MD 10/20/2016 9:06 AM

## 2016-10-20 NOTE — Patient Instructions (Signed)

## 2016-10-20 NOTE — Telephone Encounter (Signed)
Gave patient avs report and appointments for January  °

## 2016-10-20 NOTE — Assessment & Plan Note (Signed)
She has history of recurrent sinusitis. She is currently on conservative management. Clinically, she does not appear to have bacterial infection. We will proceed with IVIG as above

## 2016-10-20 NOTE — Assessment & Plan Note (Signed)
Her blood work and examination confirmed disease relapse. She has mild palpable splenomegaly on exam, that could account for discomfort over the left flank region. I recommend repeat imaging study with PET CT scan to assess. My concern would be possible transformation to high-grade lymphoma I will see her back after the new year to review test results

## 2016-10-31 ENCOUNTER — Other Ambulatory Visit: Payer: Self-pay | Admitting: Hematology and Oncology

## 2016-10-31 ENCOUNTER — Encounter (HOSPITAL_COMMUNITY)
Admission: RE | Admit: 2016-10-31 | Discharge: 2016-10-31 | Disposition: A | Discharge status: Home / Self Care | Payer: Medicare Other | Source: Ambulatory Visit | Attending: Hematology and Oncology | Admitting: Hematology and Oncology

## 2016-10-31 DIAGNOSIS — C9110 Chronic lymphocytic leukemia of B-cell type not having achieved remission: Secondary | ICD-10-CM

## 2016-10-31 LAB — GLUCOSE, CAPILLARY: Glucose-Capillary: 94 mg/dL (ref 65–99)

## 2016-10-31 MED ORDER — FLUDEOXYGLUCOSE F - 18 (FDG) INJECTION
6.8500 | Freq: Once | INTRAVENOUS | Status: AC | PRN
  Administered 2016-10-31: 6.85 via INTRAVENOUS

## 2016-11-04 ENCOUNTER — Telehealth: Payer: Self-pay | Admitting: Hematology and Oncology

## 2016-11-04 ENCOUNTER — Ambulatory Visit (HOSPITAL_BASED_OUTPATIENT_CLINIC_OR_DEPARTMENT_OTHER): Payer: Medicare Other

## 2016-11-04 ENCOUNTER — Other Ambulatory Visit: Payer: Self-pay | Admitting: Hematology and Oncology

## 2016-11-04 ENCOUNTER — Ambulatory Visit (HOSPITAL_BASED_OUTPATIENT_CLINIC_OR_DEPARTMENT_OTHER): Payer: Medicare Other | Admitting: Hematology and Oncology

## 2016-11-04 VITALS — BP 122/46 | HR 75 | Temp 97.1°F | Resp 17 | Ht 60.0 in | Wt 117.3 lb

## 2016-11-04 DIAGNOSIS — C9110 Chronic lymphocytic leukemia of B-cell type not having achieved remission: Secondary | ICD-10-CM

## 2016-11-04 DIAGNOSIS — E790 Hyperuricemia without signs of inflammatory arthritis and tophaceous disease: Secondary | ICD-10-CM | POA: Diagnosis not present

## 2016-11-04 DIAGNOSIS — Z7189 Other specified counseling: Secondary | ICD-10-CM | POA: Insufficient documentation

## 2016-11-04 LAB — COMPREHENSIVE METABOLIC PANEL
ALBUMIN: 4.1 g/dL (ref 3.5–5.0)
ALK PHOS: 43 U/L (ref 40–150)
ALT: 17 U/L (ref 0–55)
AST: 28 U/L (ref 5–34)
Anion Gap: 9 mEq/L (ref 3–11)
BUN: 19.8 mg/dL (ref 7.0–26.0)
CALCIUM: 10.2 mg/dL (ref 8.4–10.4)
CO2: 25 mEq/L (ref 22–29)
CREATININE: 1.1 mg/dL (ref 0.6–1.1)
Chloride: 107 mEq/L (ref 98–109)
EGFR: 46 mL/min/{1.73_m2} — ABNORMAL LOW (ref >90)
GLUCOSE: 93 mg/dL (ref 70–140)
POTASSIUM: 4.3 meq/L (ref 3.5–5.1)
SODIUM: 141 meq/L (ref 136–145)
TOTAL PROTEIN: 7.4 g/dL (ref 6.4–8.3)
Total Bilirubin: 0.48 mg/dL (ref 0.20–1.20)

## 2016-11-04 LAB — CBC WITH DIFFERENTIAL/PLATELET
BASO%: 1.1 % (ref 0.0–2.0)
BASOS ABS: 0.1 10*3/uL (ref 0.0–0.1)
EOS%: 1.5 % (ref 0.0–7.0)
Eosinophils Absolute: 0.2 10*3/uL (ref 0.0–0.5)
HEMATOCRIT: 38.4 % (ref 34.8–46.6)
HEMOGLOBIN: 12.8 g/dL (ref 11.6–15.9)
LYMPH%: 64.3 % — ABNORMAL HIGH (ref 14.0–49.7)
MCH: 30.3 pg (ref 25.1–34.0)
MCHC: 33.2 g/dL (ref 31.5–36.0)
MCV: 91.2 fL (ref 79.5–101.0)
MONO#: 0.7 10*3/uL (ref 0.1–0.9)
MONO%: 6 % (ref 0.0–14.0)
NEUT%: 27.1 % — ABNORMAL LOW (ref 38.4–76.8)
NEUTROS ABS: 3.3 10*3/uL (ref 1.5–6.5)
Platelets: 79 10*3/uL — ABNORMAL LOW (ref 145–400)
RBC: 4.21 10*6/uL (ref 3.70–5.45)
RDW: 13.5 % (ref 11.2–14.5)
WBC: 12.3 10*3/uL — AB (ref 3.9–10.3)
lymph#: 7.9 10*3/uL — ABNORMAL HIGH (ref 0.9–3.3)

## 2016-11-04 LAB — URIC ACID: URIC ACID, SERUM: 7.8 mg/dL — AB (ref 2.6–7.4)

## 2016-11-04 LAB — TECHNOLOGIST REVIEW

## 2016-11-04 MED ORDER — LIDOCAINE-PRILOCAINE 2.5-2.5 % EX CREA: 1.0000 "application " | TOPICAL_CREAM | CUTANEOUS | 6 refills | Status: DC | PRN

## 2016-11-04 MED ORDER — ALLOPURINOL 300 MG PO TABS: 300.0000 mg | ORAL_TABLET | Freq: Every day | ORAL | 0 refills | Status: DC

## 2016-11-04 MED ORDER — IDELALISIB 150 MG PO TABS: 150.0000 mg | ORAL_TABLET | Freq: Two times a day (BID) | ORAL | 11 refills | Status: DC

## 2016-11-04 NOTE — Progress Notes (Signed)
START ON PATHWAY REGIMEN - Lymphoma and CLL  [id number]: Rituximab x 8 Doses + Idelalisib 150 mg PO BID Continued Until Progression or Unacceptable Toxicity   Rituximab x 8 doses (see below):     Rituximab (Rituxan(R)) 375 mg/m2 in _____ mL NS IV.  Initiate first dose at a rate of 50 mg/hr.  In the absence of infusion toxicity, increase infusion rate by 50 mg/hr increments every 30 minutes, to a maximum of 400 mg/hr Dose Mod: None   Rituximab x 8 doses (see below):     Rituximab (Rituxan(R)) 500 mg/m2 in _____ mL NS IV.  If first dose tolerated, may initiate subsequent infusions at an initial rate of 100 mg/hr.  In the absence of infusion toxicity, may increase rate by 100 mg/hr increments at 30-min intervals to a  maximum of 400 mg/hr Dose Mod: None   Administer daily:     Idelalisib (Zydelig(R)) 150 mg 150 mg orally twice daily, with or without food, swallow tablets whole Dose Mod: None Additional Orders: Rituximab given for a total of 8 doses (375mg /m2 x 1, followed by 500mg /m2 q2 weeks x 4, then 500mg /m2 q4 weeks x 3); idelalisib starts with rituximab week 1 and continues until disease progression or unacceptable toxicity. Monitor  CBC, hepatic function, glucose, pulmonary function (hypoxia/cough), and triglycerides prior to and during therapy. Avoid concomitant strong CYP3A inducers/substrates with idelalisib, monitor for toxicity if given with CYP3A strong inhibitors. Hepatitis  B&C testing recommended prior to rituximab use on all patients. Final concentration of rituximab must be between 1 and 4 mg/mL.  **Always confirm dose/schedule in your pharmacy ordering system**    Patient Characteristics: Chronic Lymphocytic Leukemia (CLL), Third Line, 17p del (-), Prior Ibrutinib Disease Type: Chronic Lymphocytic Leukemia (CLL) Disease Type: Not Applicable Line of therapy: Third Line RAI Stage: IV 17p Deletion Status: Negative Prior Treatment: Prior Ibrutinib  Intent of  Therapy: Non-Curative / Palliative Intent, Discussed with Patient

## 2016-11-04 NOTE — Progress Notes (Signed)
[facility] OFFICE PROGRESS NOTE  Patient Care Team: Shon Baton, MD as PCP - General (Internal Medicine) Heath Lark, MD as Consulting Physician (Hematology and Oncology) Eustace Quail Tat, DO as Consulting Physician (Neurology)  SUMMARY OF ONCOLOGIC HISTORY: Oncology History   CLL stage IV, deletion 13q     CLL (chronic lymphocytic leukemia) ([place])   12/12/2013 Bone Marrow Biopsy    Bone marrow biopsy show significant involvement of CLL      12/13/2013 Procedure    Patient had placement of a port      12/14/2013 Imaging    CT scan of the chest, abdomen and pelvis showed significant lymphadenopathy and splenomegaly      12/20/2013 - 12/22/2013 Hospital Admission    The patient was admitted to the hospital to begin cycle 1 day 1 of chemotherapy due to risk of infusion reaction. She tolerated treatment well without major side effects.      12/20/2013 - 03/13/2014 Chemotherapy    started on cycle 1 of Obinutuzumab, chlorambucil and prednisone. All treatment placed on hold on 03/13/2014 due to persistent pancytopenia.      03/28/2014 Imaging    Repeat imaging study showed excellent response to treatment.      10/25/2014 Imaging    She has persistent mild lymphadenopathy and splenomegaly, improved compared to previous exam      04/23/2015 Imaging    Ct scan showed disease progression      05/09/2015 - 11/26/2015 Chemotherapy    She started taking ibrutinib.      11/26/2015 Adverse Reaction    Treatment  was placed on hold due to neutropenia and recent infection      02/11/2016 -  Chemotherapy    She is started on monthly IVIG Rx for recurrent infection and acquired panhypogammaglobulinemia       INTERVAL HISTORY: Please see below for problem oriented charting. She returns with her husband to review test results She has self-palpated a lot of enlarged lymph nodes She has recent skin lesion treated with topical medication by dermatologist She has another episode of  sinusitis and saw her PCP who prescribed a course of antibiotics  REVIEW OF SYSTEMS:   Constitutional: Denies fevers, chills or abnormal weight loss Eyes: Denies blurriness of vision Ears, nose, mouth, throat, and face: Denies mucositis or sore throat Respiratory: Denies cough, dyspnea or wheezes Cardiovascular: Denies palpitation, chest discomfort or lower extremity swelling Gastrointestinal:  Denies nausea, heartburn or change in bowel habits Neurological:Denies numbness, tingling or new weaknesses Behavioral/Psych: Mood is stable, no new changes  All other systems were reviewed with the patient and are negative.  I have reviewed the past medical history, past surgical history, social history and family history with the patient and they are unchanged from previous note.  ALLERGIES:  is allergic to bactrim [sulfamethoxazole w/trimethoprim (co-trimoxazole)].  MEDICATIONS:  Current Outpatient Prescriptions  Medication Sig Dispense Refill  . acyclovir (ZOVIRAX) 400 MG tablet Take 1 tablet (400 mg total) by mouth daily. 90 tablet 3  . atorvastatin (LIPITOR) 20 MG tablet Take 20 mg by mouth every evening.     . calcium citrate-vitamin D (CITRACAL+D) 315-200 MG-UNIT per tablet Take 1 tablet by mouth daily.     . Cholecalciferol (VITAMIN D) 400 UNITS capsule Take 1,000 Units by mouth at bedtime.     . diphenhydrAMINE (BENADRYL) 25 mg capsule Take 50 mg by mouth at bedtime as needed for allergies (allergies).     . Echinacea 400 MG CAPS Take 1  capsule by mouth 2 (two) times daily.      Marland Kitchen ESTRACE VAGINAL 0.1 MG/GM vaginal cream Place 1 Applicatorful vaginally 2 (two) times a week.     . folic acid (FOLVITE) 315 MCG tablet Take 400 mcg by mouth daily.    Marland Kitchen glucosamine-chondroitin 500-400 MG tablet Take 1 tablet by mouth daily.      Marland Kitchen levETIRAcetam (KEPPRA XR) 500 MG 24 hr tablet TAKE 2 TABLETS BY MOUTH  DAILY 180 tablet 1  . loratadine (CLARITIN) 10 MG tablet Take 10 mg by mouth daily.      .  montelukast (SINGULAIR) 10 MG tablet Take 10 mg by mouth daily.     . Multiple Vitamin (MULTIVITAMIN) tablet Take 1 tablet by mouth daily.      . polyvinyl alcohol (ARTIFICIAL TEARS) 1.4 % ophthalmic solution Place 1 drop into both eyes 3 (three) times daily as needed for dry eyes (dry eyes). Reported on 03/10/2016    . sodium chloride (OCEAN) 0.65 % SOLN nasal spray Place 1 spray into both nostrils 2 (two) times daily.     Marland Kitchen allopurinol (ZYLOPRIM) 300 MG tablet Take 1 tablet (300 mg total) by mouth daily. 30 tablet 0  . idelalisib (ZYDELIG) 150 MG tablet Take 1 tablet (150 mg total) by mouth 2 (two) times daily. 60 tablet 11  . lidocaine-prilocaine (EMLA) cream Apply 1 application topically as needed. 30 g 6   No current facility-administered medications for this visit.     PHYSICAL EXAMINATION: ECOG PERFORMANCE STATUS: 1 - Symptomatic but completely ambulatory  Vitals:   11/04/16 1451  BP: (!) 122/46  Pulse: 75  Resp: 17  Temp: 97.1 F (36.2 C)   Filed Weights   11/04/16 1451  Weight: 117 lb 4.8 oz (53.2 kg)    GENERAL:alert, no distress and comfortable SKIN: Noted significant skin rash on the left side of her face EYES: normal, Conjunctiva are pink and non-injected, sclera clear Musculoskeletal:no cyanosis of digits and no clubbing  NEURO: alert & oriented x 3 with fluent speech, no focal motor/sensory deficits  LABORATORY DATA:  I have reviewed the data as listed    Component Value Date/Time   NA 141 11/04/2016 1612   K 4.3 11/04/2016 1612   CL 109 05/13/2016 1238   CL 99 04/08/2013 0922   CO2 25 11/04/2016 1612   GLUCOSE 93 11/04/2016 1612   GLUCOSE 92 04/08/2013 0922   BUN 19.8 11/04/2016 1612   CREATININE 1.1 11/04/2016 1612   CALCIUM 10.2 11/04/2016 1612   PROT 7.4 11/04/2016 1612   ALBUMIN 4.1 11/04/2016 1612   AST 28 11/04/2016 1612   ALT 17 11/04/2016 1612   ALKPHOS 43 11/04/2016 1612   BILITOT 0.48 11/04/2016 1612   GFRNONAA 53 (L) 05/13/2016 1238    GFRAA >60 05/13/2016 1238    No results found for: SPEP, UPEP  Lab Results  Component Value Date   WBC 12.3 (H) 11/04/2016   NEUTROABS 3.3 11/04/2016   HGB 12.8 11/04/2016   HCT 38.4 11/04/2016   MCV 91.2 11/04/2016   PLT 79 (L) 11/04/2016      Chemistry      Component Value Date/Time   NA 141 11/04/2016 1612   K 4.3 11/04/2016 1612   CL 109 05/13/2016 1238   CL 99 04/08/2013 0922   CO2 25 11/04/2016 1612   BUN 19.8 11/04/2016 1612   CREATININE 1.1 11/04/2016 1612      Component Value Date/Time   CALCIUM 10.2 11/04/2016 1612  ALKPHOS 43 11/04/2016 1612   AST 28 11/04/2016 1612   ALT 17 11/04/2016 1612   BILITOT 0.48 11/04/2016 1612       RADIOGRAPHIC STUDIES:I reviewed the imaging with her and her husband I have personally reviewed the radiological images as listed and agreed with the findings in the report. Nm Pet Image Initial (pi) Skull Base To Thigh  Result Date: 10/31/2016 CLINICAL DATA:  Initial treatment strategy for chronic lymphocytic leukemia. EXAM: NUCLEAR MEDICINE PET SKULL BASE TO THIGH TECHNIQUE: 6.85 mCi F-18 FDG was injected intravenously. Full-ring PET imaging was performed from the skull base to thigh after the radiotracer. CT data was obtained and used for attenuation correction and anatomic localization. FASTING BLOOD GLUCOSE:  Value: 94 mg/dl COMPARISON:  None. FINDINGS: NECK Prominent bilateral cervical lymph nodes are identified. Index right level 3 lymph node measures 1 cm and has an SUV max equal to 2.8, image 30 of series 4. Right level 3 lymph node measures 1 cm and has an SUV max equal to 3.13. CHEST Bulky bilateral axillary adenopathy is identified index right axillary node measures 2.8 cm and has an SUV max equal to 3.46, image 52 of series 4 peer left axillary lymph node measures 1.5 cm equal to 3.2. Hypermetabolic mediastinal and hilar lymph nodes are identified. Index right paratracheal node measures 8 mm and has an SUV max equal to 6.38. SUV  max associated with the right hilar region is equal to 5.58. Enlarged and hypermetabolic left internal mammary lymph nodes identified. Index left internal mammary lymph node measures 8 mm and has an SUV max equal to 2.57. There is no pleural fluid identified. No suspicious nodules identified. ABDOMEN/PELVIS No abnormal uptake identified within the liver or pancreas. The adrenal glands are unremarkable. The kidneys appear normal. No mass or hydronephrosis involving the kidneys. The urinary bladder is unremarkable. The spleen measures 10 cm in length. No focal areas of abnormal increased uptake identified within the spleen. Bulky adenopathy is identified within the abdomen and pelvis. Index periaortic lymph node measures 2.5 cm and has an SUV max equal to 3.2 just above the aortic bifurcation there is an aortocaval node measuring 2.5 cm with an SUV max equal to 3.5. Bilateral iliac adenopathy is identified. Index right common iliac node measures 2.6 cm and has an SUV max equal to 3.44. Index left pelvic sidewall lymph node measures 2.3 cm and has an SUV max equal to 3.55. Right pelvic sidewall lymph node Measures 2.3 cm and has an SUV max equal to 3.7. Enlarged bilateral inguinal lymph nodes are identified. Right inguinal node measures 2.2 cm and has an SUV max equal to 3.8. SKELETON No focal hypermetabolic activity to suggest skeletal metastasis. IMPRESSION: 1. Examination is positive for bulky adenopathy within the neck, chest, abdomen and pelvis compatible with the clinical history of chronic lymphocytic leukemia. Electronically Signed   By: Kerby Moors M.D.   On: 10/31/2016 15:27    ASSESSMENT & PLAN:  CLL (chronic lymphocytic leukemia) We reviewed imaging studies She has extensive bulky lymphadenopathy and progressive thrombocytopenia We reviewed guidelines We discussion options including rechallenging with Ibrutinib versus Rituxan/Idelalisib combo treatment I am concerned about poor tolerance to  Ibrutinib in the past This is based on recent FDA approval and strong recommendation from NCCN guidelines, based on publication below  We discussed the role of treatment is strictly palliative  Idelalisib and Rituximab in Relapsed Chronic Lymphocytic Leukemia Richard R. Valera Castle, M.D., Lowell Bouton, M.D., Virgina Evener. Mitzie Na, M.D., Darnell Level  Glean Hess, M.D., Rocky Crafts, M.D., Ph.D., Raford Pitcher, M.B., Ch.B., Ph.D., Link Snuffer, M.D., Hazle Coca, M.D., Ph.D., Mahala Menghini, M.D., Ph.D., Milana Obey, M.D., Ph.D., Donnal Debar, M.D., Ph.D., Ron Agee, M.D., Heloise Purpura, M.D., Roderic Ovens, M.D., Jannet Askew, M.D., Ph.D., Beryle Lathe. Lucile Shutters, Ph.D., F.R.C.Path., Katy Apo, M.D., Ph.D., Lequita Asal, M.D., Mallory Shirk, M.D., Joycelyn Das, M.A., Tana Coast, B.S., Rafael Bihari. Sabra Heck, M.D., Milana Na, Ph.D., Samuel Germany, M.D., Ph.D., Maia Petties. Agnes Lawrence, M.D., Bonney Leitz, M.D., and Beverly Gust. Werner Lean, M.D. Alison Stalling J Med 2014; [id number] 29, 2014DOI: 10.1056/[id number]  Methods In this multicenter, randomized, double-blind, placebo-controlled, phase 3 study, we assessed the efficacy and safety of idelalisib, an oral inhibitor of the delta isoform of phosphatidylinositol 3-kinase, in combination with rituximab versus rituximab plus placebo. We randomly assigned 220 patients with decreased renal function, previous therapy-induced myelosuppression, or major coexisting illnesses to receive rituximab and either idelalisib (at a dose of 150 mg) or placebo twice daily. The primary end point was progression-free survival. At the first prespecified interim analysis, the study was stopped early on the recommendation of the data and safety monitoring board owing to overwhelming efficacy  Results The median progression-free survival was 5.5 months in the placebo group and was not reached in the idelalisib group (hazard ratio for progression or death in the idelalisib group,  0.15; P<0.001). Patients receiving idelalisib versus those receiving placebo had improved rates of overall response (81% vs. 13%; odds ratio, 29.92; P<0.001) and overall survival at 12 months (92% vs. 80%; hazard ratio for death, 0.28; P=0.02). Serious adverse events occurred in 40% of the patients receiving idelalisib and rituximab and in 35% of those receiving placebo and rituximab.  More than 90% of the patients had at least one adverse event. In the idelalisib group, the five most common adverse events were pyrexia, fatigue, nausea, chills, and diarrhea. In the placebo group, the adverse events were similar to those in the idelalisib group, with the most common being infusion-related reactions, fatigue, cough, nausea, and dyspnea. Most adverse events across the two study groups were grade 2 or lower. In the idelalisib group, grade 3 or higher diarrhea was reported in four patients and grade 3 or higher rash was reported in two patients, with no grade 3 or higher diarrhea or rash reported in the placebo group. Common laboratory abnormalities included anemia, neutropenia, and thrombocytopenia. Hepatic aminotransferase elevations occurred more frequently in patients receiving idelalisib and rituximab than in those receiving placebo and rituximab. Grade 3 or higher elevations occurred in six patients (5%) in the idelalisib group, with onset at 8 to 16 weeks. The study drug was withheld and successfully reinitiated in four of these six patients. No patients discontinued the study drug because of aminotransferase elevations. At least one serious adverse event occurred in 44 patients (40%) in the idelalisib group and in 37 patients (35%) in the placebo group. The most frequent serious adverse events in the two groups were pneumonia, pyrexia, and febrile neutropenia  Conclusions The combination of idelalisib and rituximab, as compared with placebo and rituximab, significantly improved progression-free survival,  response rate, and overall survival among patients with relapsed CLL who were less able to undergo chemotherapy  Infusion reactions are common side effects.  Some of the short term side-effects included, though not limited to, risk of fatigue, weight loss, tumor lysis syndrome, risk of allergic reactions, pancytopenia, risk of blood clots, life-threatening infections, need for transfusions of blood products, admission to hospital for  various reasons, and risks of death.   The patient is aware that the response rates discussed earlier is not guaranteed.    After a long discussion, patient made an informed decision to proceed with the prescribed plan of care.  I plan to premedicate her with prednisone 60 mg PO daily x 2 days starting the day before 1st day of treatment She has high uric acid level; I plan to start her on daily allopurinol and she will receive 1 dose of rasburicase for tumor lysis prophylaxis on day 1 of treatment She will continue taking acyclovir for antimicrobial prophylaxis I will see her prior to cycle 2 of Rituxan  Goals of care, counseling/discussion We discussed goals of care is strictly palliative and her disease is non-curative  Hyperuricemia She is at high risk for tumor lysis I will start her on allopurinol now and she will receive rasburicase with cycle 1 day 1 of treatment I will recheck uric acid level prior to cycle 2   Orders Placed This Encounter  Procedures  . Hepatitis B core antibody, IgM    Standing Status:   Future    Number of Occurrences:   1    Standing Expiration Date:   12/09/2017  . Hepatitis B surface antibody    Standing Status:   Future    Number of Occurrences:   1    Standing Expiration Date:   12/09/2017  . Hepatitis B surface antigen    Standing Status:   Future    Number of Occurrences:   1    Standing Expiration Date:   12/09/2017  . Lactate dehydrogenase    Standing Status:   Future    Number of Occurrences:   1    Standing  Expiration Date:   12/09/2017  . Uric acid    Standing Status:   Future    Number of Occurrences:   1    Standing Expiration Date:   12/09/2017  . Uric acid    Standing Status:   Standing    Number of Occurrences:   3    Standing Expiration Date:   11/04/2017  . Lactate dehydrogenase    Standing Status:   Standing    Number of Occurrences:   9    Standing Expiration Date:   11/04/2017   All questions were answered. The patient knows to call the clinic with any problems, questions or concerns. No barriers to learning was detected. I spent 40 minutes counseling the patient face to face. The total time spent in the appointment was60 minutes and more than 50% was on counseling and review of test results     Heath Lark, MD 11/04/2016 9:02 PM

## 2016-11-04 NOTE — Progress Notes (Signed)
Patient on plan of care prior to pathways. 

## 2016-11-04 NOTE — Telephone Encounter (Signed)
Lab was added for today. Appointments added per 11/04/16 los.Lenna Sciara will add chemo dates of 01/15 and 01/29.  Patient was given a copy of the AVS report and appointment schedule per 11/04/16 los.

## 2016-11-04 NOTE — Assessment & Plan Note (Signed)
She is at high risk for tumor lysis I will start her on allopurinol now and she will receive rasburicase with cycle 1 day 1 of treatment I will recheck uric acid level prior to cycle 2

## 2016-11-04 NOTE — Assessment & Plan Note (Signed)
We discussed goals of care is strictly palliative and her disease is non-curative

## 2016-11-04 NOTE — Assessment & Plan Note (Signed)
We reviewed imaging studies She has extensive bulky lymphadenopathy and progressive thrombocytopenia We reviewed guidelines We discussion options including rechallenging with Ibrutinib versus Rituxan/Idelalisib combo treatment I am concerned about poor tolerance to Ibrutinib in the past This is based on recent FDA approval and strong recommendation from NCCN guidelines, based on publication below  We discussed the role of treatment is strictly palliative  Idelalisib and Rituximab in Relapsed Chronic Lymphocytic Leukemia Richard R. Valera Castle, M.D., Lowell Bouton, M.D., Virgina Evener. Mitzie Na, M.D., Kingsley Spittle. Augustin Schooling, M.D., Rocky Crafts, M.D., Ph.D., Raford Pitcher, M.B., Ch.B., Ph.D., Link Snuffer, M.D., Hazle Coca, M.D., Ph.D., Mahala Menghini, M.D., Ph.D., Milana Obey, M.D., Ph.D., Donnal Debar, M.D., Ph.D., Ron Agee, M.D., Heloise Purpura, M.D., Roderic Ovens, M.D., Jannet Askew, M.D., Ph.D., Beryle Lathe. Lucile Shutters, Ph.D., F.R.C.Path., Katy Apo, M.D., Ph.D., Lequita Asal, M.D., Mallory Shirk, M.D., Joycelyn Das, M.A., Tana Coast, B.S., Rafael Bihari. Sabra Heck, M.D., Milana Na, Ph.D., Samuel Germany, M.D., Ph.D., Maia Petties. Agnes Lawrence, M.D., Bonney Leitz, M.D., and Beverly Gust. Werner Lean, M.D. Alison Stalling J Med 2014; [id number], 2014DOI: 10.1056/[id number]  Methods In this multicenter, randomized, double-blind, placebo-controlled, phase 3 study, we assessed the efficacy and safety of idelalisib, an oral inhibitor of the delta isoform of phosphatidylinositol 3-kinase, in combination with rituximab versus rituximab plus placebo. We randomly assigned 220 patients with decreased renal function, previous therapy-induced myelosuppression, or major coexisting illnesses to receive rituximab and either idelalisib (at a dose of 150 mg) or placebo twice daily. The primary end point was progression-free survival. At the first prespecified interim analysis, the study was stopped early on the  recommendation of the data and safety monitoring board owing to overwhelming efficacy  Results The median progression-free survival was 5.5 months in the placebo group and was not reached in the idelalisib group (hazard ratio for progression or death in the idelalisib group, 0.15; P<0.001). Patients receiving idelalisib versus those receiving placebo had improved rates of overall response (81% vs. 13%; odds ratio, 29.92; P<0.001) and overall survival at 12 months (92% vs. 80%; hazard ratio for death, 0.28; P=0.02). Serious adverse events occurred in 40% of the patients receiving idelalisib and rituximab and in 35% of those receiving placebo and rituximab.  More than 90% of the patients had at least one adverse event. In the idelalisib group, the five most common adverse events were pyrexia, fatigue, nausea, chills, and diarrhea. In the placebo group, the adverse events were similar to those in the idelalisib group, with the most common being infusion-related reactions, fatigue, cough, nausea, and dyspnea. Most adverse events across the two study groups were grade 2 or lower. In the idelalisib group, grade 3 or higher diarrhea was reported in four patients and grade 3 or higher rash was reported in two patients, with no grade 3 or higher diarrhea or rash reported in the placebo group. Common laboratory abnormalities included anemia, neutropenia, and thrombocytopenia. Hepatic aminotransferase elevations occurred more frequently in patients receiving idelalisib and rituximab than in those receiving placebo and rituximab. Grade 3 or higher elevations occurred in six patients (5%) in the idelalisib group, with onset at 8 to 16 weeks. The study drug was withheld and successfully reinitiated in four of these six patients. No patients discontinued the study drug because of aminotransferase elevations. At least one serious adverse event occurred in 44 patients (40%) in the idelalisib group and in 37 patients (35%) in  the placebo group. The most frequent serious adverse events in the two groups were pneumonia, pyrexia,  and febrile neutropenia  Conclusions The combination of idelalisib and rituximab, as compared with placebo and rituximab, significantly improved progression-free survival, response rate, and overall survival among patients with relapsed CLL who were less able to undergo chemotherapy  Infusion reactions are common side effects.  Some of the short term side-effects included, though not limited to, risk of fatigue, weight loss, tumor lysis syndrome, risk of allergic reactions, pancytopenia, risk of blood clots, life-threatening infections, need for transfusions of blood products, admission to hospital for various reasons, and risks of death.   The patient is aware that the response rates discussed earlier is not guaranteed.    After a long discussion, patient made an informed decision to proceed with the prescribed plan of care.  I plan to premedicate her with prednisone 60 mg PO daily x 2 days starting the day before 1st day of treatment She has high uric acid level; I plan to start her on daily allopurinol and she will receive 1 dose of rasburicase for tumor lysis prophylaxis on day 1 of treatment She will continue taking acyclovir for antimicrobial prophylaxis I will see her prior to cycle 2 of Rituxan

## 2016-11-05 ENCOUNTER — Encounter: Payer: Self-pay | Admitting: Pharmacist

## 2016-11-05 ENCOUNTER — Telehealth: Payer: Self-pay | Admitting: *Deleted

## 2016-11-05 LAB — LACTATE DEHYDROGENASE: LDH: 167 U/L (ref 125–245)

## 2016-11-05 LAB — HEPATITIS B CORE ANTIBODY, IGM: HEP B C IGM: NEGATIVE

## 2016-11-05 LAB — HEPATITIS B SURFACE ANTIBODY,QUALITATIVE: Hep B Surface Ab, Qual: REACTIVE

## 2016-11-05 LAB — HEPATITIS B SURFACE ANTIGEN: HBsAg Screen: NEGATIVE

## 2016-11-05 NOTE — Progress Notes (Signed)
My note earlier today was incorrectly signed as Montel Clock, Pharm.D. Addendum to correct.  Kennith Center, Pharm.D., CPP 11/05/2016@11 :12 AM Oral [facility]

## 2016-11-05 NOTE — Progress Notes (Signed)
Oral Chemotherapy Pharmacist Encounter New  Prescription for Angelica Sullivan has been sent to the [facility] outpatient pharmacy for benefit analysis and approval.   Drug interactions detected: Zydelig (CYP3A4 inhibitor; major) + Lipitor (CYP3A4 substrate)- risk category X; recommendation is to avoid combination as Zydelig may increase serum conc of Lipitor significantly.  Zydelig + Echinacea - risk category D; recommendation is to consider therapy modification.  Echinacea may diminish the therapeutic benefit of immunosuppressants.  Will follow up with patient regarding insurance and pharmacy.  Once pt begins tx, we will follow up in 1-2 weeks for adherence and toxicity management.   Thank you,  Montel Clock, PharmD, [facility]

## 2016-11-05 NOTE — Telephone Encounter (Signed)
Pt notified to stop herbal supplement while on Zydelig. Verbalized understanding and will stop Echinacea today

## 2016-11-06 ENCOUNTER — Telehealth: Payer: Self-pay | Admitting: *Deleted

## 2016-11-06 NOTE — Telephone Encounter (Signed)
Informed pt of Dr. Calton Dach message regarding elevated uric acid, needs to increase fluid intake and Dr. Alvy Bimler will give her two treatments with Rasburicase instead of just one.  Pt verbalized understanding.

## 2016-11-08 ENCOUNTER — Telehealth: Payer: Self-pay | Admitting: Pharmacist

## 2016-11-08 NOTE — Telephone Encounter (Signed)
Oral Chemotherapy Pharmacist Encounter  Received notification from [facility] that patient's Zydelig (idelalisib) would require prior authorization. PA submitted on CoverMyMeds Key [id number] Status is pending, may take 72 hours for determination.  This encounter will continue to be updated until final determination.  Oral Oncology Clinic will continue to follow.   Johny Drilling, PharmD, BCPS, BCOP 11/08/2016  12:38 PM Oral Oncology Clinic [phone]

## 2016-11-14 ENCOUNTER — Other Ambulatory Visit: Payer: Self-pay | Admitting: *Deleted

## 2016-11-14 MED ORDER — IDELALISIB 150 MG PO TABS: 150.0000 mg | ORAL_TABLET | Freq: Two times a day (BID) | ORAL | 11 refills | Status: DC

## 2016-11-17 ENCOUNTER — Ambulatory Visit (HOSPITAL_BASED_OUTPATIENT_CLINIC_OR_DEPARTMENT_OTHER): Payer: Medicare Other

## 2016-11-17 ENCOUNTER — Other Ambulatory Visit (HOSPITAL_BASED_OUTPATIENT_CLINIC_OR_DEPARTMENT_OTHER): Payer: Medicare Other

## 2016-11-17 ENCOUNTER — Other Ambulatory Visit: Payer: Self-pay | Admitting: Pharmacist

## 2016-11-17 VITALS — BP 116/47 | HR 73 | Temp 97.8°F | Resp 17

## 2016-11-17 DIAGNOSIS — Z5112 Encounter for antineoplastic immunotherapy: Secondary | ICD-10-CM

## 2016-11-17 DIAGNOSIS — C9110 Chronic lymphocytic leukemia of B-cell type not having achieved remission: Secondary | ICD-10-CM

## 2016-11-17 LAB — COMPREHENSIVE METABOLIC PANEL
ALK PHOS: 47 U/L (ref 40–150)
ALT: 21 U/L (ref 0–55)
ANION GAP: 9 meq/L (ref 3–11)
AST: 31 U/L (ref 5–34)
Albumin: 4 g/dL (ref 3.5–5.0)
BUN: 18.5 mg/dL (ref 7.0–26.0)
CALCIUM: 10.1 mg/dL (ref 8.4–10.4)
CHLORIDE: 104 meq/L (ref 98–109)
CO2: 21 mEq/L — ABNORMAL LOW (ref 22–29)
Creatinine: 1 mg/dL (ref 0.6–1.1)
EGFR: 56 mL/min/{1.73_m2} — AB (ref >90)
Glucose: 216 mg/dl — ABNORMAL HIGH (ref 70–140)
POTASSIUM: 4.3 meq/L (ref 3.5–5.1)
Sodium: 134 mEq/L — ABNORMAL LOW (ref 136–145)
Total Bilirubin: 0.58 mg/dL (ref 0.20–1.20)
Total Protein: 7.1 g/dL (ref 6.4–8.3)

## 2016-11-17 LAB — CBC WITH DIFFERENTIAL/PLATELET
BASO%: 0.7 % (ref 0.0–2.0)
BASOS ABS: 0.1 10*3/uL (ref 0.0–0.1)
EOS ABS: 0 10*3/uL (ref 0.0–0.5)
EOS%: 0 % (ref 0.0–7.0)
HEMATOCRIT: 38.2 % (ref 34.8–46.6)
HGB: 12.6 g/dL (ref 11.6–15.9)
LYMPH#: 12.4 10*3/uL — AB (ref 0.9–3.3)
LYMPH%: 61.5 % — ABNORMAL HIGH (ref 14.0–49.7)
MCH: 30.6 pg (ref 25.1–34.0)
MCHC: 33.1 g/dL (ref 31.5–36.0)
MCV: 92.3 fL (ref 79.5–101.0)
MONO#: 0.5 10*3/uL (ref 0.1–0.9)
MONO%: 2.7 % (ref 0.0–14.0)
NEUT#: 7.1 10*3/uL — ABNORMAL HIGH (ref 1.5–6.5)
NEUT%: 35.1 % — ABNORMAL LOW (ref 38.4–76.8)
Platelets: 89 10*3/uL — ABNORMAL LOW (ref 145–400)
RBC: 4.13 10*6/uL (ref 3.70–5.45)
RDW: 14.1 % (ref 11.2–14.5)
WBC: 20.2 10*3/uL — ABNORMAL HIGH (ref 3.9–10.3)

## 2016-11-17 LAB — LACTATE DEHYDROGENASE: LDH: 236 U/L (ref 125–245)

## 2016-11-17 LAB — URIC ACID: URIC ACID, SERUM: 3.9 mg/dL (ref 2.6–7.4)

## 2016-11-17 LAB — TECHNOLOGIST REVIEW

## 2016-11-17 MED ORDER — SODIUM CHLORIDE 0.9% FLUSH
10.0000 mL | INTRAVENOUS | Status: DC | PRN
  Administered 2016-11-17: 10 mL
  Filled 2016-11-17: qty 10

## 2016-11-17 MED ORDER — SODIUM CHLORIDE 0.9 % IV SOLN
375.0000 mg/m2 | Freq: Once | INTRAVENOUS | Status: AC
  Administered 2016-11-17: 600 mg via INTRAVENOUS
  Filled 2016-11-17: qty 50

## 2016-11-17 MED ORDER — DIPHENHYDRAMINE HCL 25 MG PO CAPS
50.0000 mg | ORAL_CAPSULE | Freq: Once | ORAL | Status: AC
  Administered 2016-11-17: 50 mg via ORAL

## 2016-11-17 MED ORDER — SODIUM CHLORIDE 0.9 % IV SOLN
Freq: Once | INTRAVENOUS | Status: AC
  Administered 2016-11-17: 11:00:00 via INTRAVENOUS

## 2016-11-17 MED ORDER — IDELALISIB 150 MG PO TABS: 150.0000 mg | ORAL_TABLET | Freq: Two times a day (BID) | ORAL | 11 refills | Status: DC

## 2016-11-17 MED ORDER — DIPHENHYDRAMINE HCL 25 MG PO CAPS
ORAL_CAPSULE | ORAL | Status: AC
  Filled 2016-11-17: qty 2

## 2016-11-17 MED ORDER — SODIUM CHLORIDE 0.9 % IV SOLN
3.0000 mg | Freq: Once | INTRAVENOUS | Status: AC
  Administered 2016-11-17: 3 mg via INTRAVENOUS
  Filled 2016-11-17: qty 2

## 2016-11-17 MED ORDER — ACETAMINOPHEN 325 MG PO TABS
650.0000 mg | ORAL_TABLET | Freq: Once | ORAL | Status: AC
  Administered 2016-11-17: 650 mg via ORAL

## 2016-11-17 MED ORDER — ACETAMINOPHEN 325 MG PO TABS
ORAL_TABLET | ORAL | Status: AC
  Filled 2016-11-17: qty 2

## 2016-11-17 MED ORDER — HEPARIN SOD (PORK) LOCK FLUSH 100 UNIT/ML IV SOLN
500.0000 [IU] | Freq: Once | INTRAVENOUS | Status: AC | PRN
  Administered 2016-11-17: 500 [IU]
  Filled 2016-11-17: qty 5

## 2016-11-17 NOTE — Telephone Encounter (Signed)
Oral Chemotherapy Pharmacist Encounter  Patient's prior authorization for Eunice Blase has been approved [id number] Effective dates: 11/08/16-12/31-18  I alerted [facility] to re-try prescription. Copayment $2840.00  Prescription then e-scribed to [facility] on 11/14/16. Notified today (1/15) they are not able to fill for this medication.  Prescription will now be faxed to [facility] will continue to follow.  Johny Drilling, PharmD, BCPS, BCOP 11/17/2016  3:33 PM Oral Oncology Clinic [phone]

## 2016-11-17 NOTE — Patient Instructions (Addendum)
[facility] Discharge Instructions for Patients Receiving Chemotherapy  Today you received the following chemotherapy agents: Rasburicase, Rituximab   To help prevent nausea and vomiting after your treatment, we encourage you to take your nausea medication as prescribed.  If you develop nausea and vomiting that is not controlled by your nausea medication, call the clinic.   BELOW ARE SYMPTOMS THAT SHOULD BE REPORTED IMMEDIATELY:  *FEVER GREATER THAN 100.5 F  *CHILLS WITH OR WITHOUT FEVER  NAUSEA AND VOMITING THAT IS NOT CONTROLLED WITH YOUR NAUSEA MEDICATION  *UNUSUAL SHORTNESS OF BREATH  *UNUSUAL BRUISING OR BLEEDING  TENDERNESS IN MOUTH AND THROAT WITH OR WITHOUT PRESENCE OF ULCERS  *URINARY PROBLEMS  *BOWEL PROBLEMS  UNUSUAL RASH Items with * indicate a potential emergency and should be followed up as soon as possible.  Feel free to call the clinic you have any questions or concerns. The clinic phone number is [phone].  Please show the [facility] at check-in to the Emergency Department and triage nurse.  Rituximab injection What is this medicine? RITUXIMAB (ri TUX i mab) is a monoclonal antibody. It is used to treat non-Hodgkin lymphoma and chronic lymphocytic leukemia. It is also used to treat rheumatoid arthritis (RA). In RA, this medicine slows the inflammatory process and help reduce joint pain and swelling. This medicine is often used with other cancer or arthritis medications. This medicine may be used for other purposes; ask your health care provider or pharmacist if you have questions. COMMON BRAND NAME(S): Rituxan What should I tell my health care provider before I take this medicine? They need to know if you have any of these conditions: -heart disease -infection (especially a virus infection such as hepatitis B, chickenpox, cold sores, or herpes) -immune system problems -irregular heartbeat -kidney disease -lung or breathing  disease, like asthma -recently received or scheduled to receive a vaccine -an unusual or allergic reaction to rituximab, mouse proteins, other medicines, foods, dyes, or preservatives -pregnant or trying to get pregnant -breast-feeding How should I use this medicine? This medicine is for infusion into a vein. It is administered in a hospital or clinic by a specially trained health care professional. A special MedGuide will be given to you by the pharmacist with each prescription and refill. Be sure to read this information carefully each time. Talk to your pediatrician regarding the use of this medicine in children. This medicine is not approved for use in children. Overdosage: If you think you have taken too much of this medicine contact a poison control center or emergency room at once. NOTE: This medicine is only for you. Do not share this medicine with others. What if I miss a dose? It is important not to miss a dose. Call your doctor or health care professional if you are unable to keep an appointment. What may interact with this medicine? -cisplatin -medicines for blood pressure -some other medicines for arthritis -vaccines This list may not describe all possible interactions. Give your health care provider a list of all the medicines, herbs, non-prescription drugs, or dietary supplements you use. Also tell them if you smoke, drink alcohol, or use illegal drugs. Some items may interact with your medicine. What should I watch for while using this medicine? Report any side effects that you notice during your treatment right away, such as changes in your breathing, fever, chills, dizziness or lightheadedness. These effects are more common with the first dose. Visit your prescriber or health care professional for checks on your  progress. You will need to have regular blood work. Report any other side effects. The side effects of this medicine can continue after you finish your treatment.  Continue your course of treatment even though you feel ill unless your doctor tells you to stop. Call your doctor or health care professional for advice if you get a fever, chills or sore throat, or other symptoms of a cold or flu. Do not treat yourself. This drug decreases your body's ability to fight infections. Try to avoid being around people who are sick. This medicine may increase your risk to bruise or bleed. Call your doctor or health care professional if you notice any unusual bleeding. Be careful brushing and flossing your teeth or using a toothpick because you may get an infection or bleed more easily. If you have any dental work done, tell your dentist you are receiving this medicine. Avoid taking products that contain aspirin, acetaminophen, ibuprofen, naproxen, or ketoprofen unless instructed by your doctor. These medicines may hide a fever. Do not become pregnant while taking this medicine. Women should inform their doctor if they wish to become pregnant or think they might be pregnant. There is a potential for serious side effects to an unborn child. Talk to your health care professional or pharmacist for more information. Do not breast-feed an infant while taking this medicine. What side effects may I notice from receiving this medicine? Side effects that you should report to your doctor or health care professional as soon as possible: -allergic reactions like skin rash, itching or hives, swelling of the face, lips, or tongue -low blood counts - this medicine may decrease the number of white blood cells, red blood cells and platelets. You may be at increased risk for infections and bleeding. -signs of infection - fever or chills, cough, sore throat, pain or difficulty passing urine -signs of decreased platelets or bleeding - bruising, pinpoint red spots on the skin, black, tarry stools, blood in the urine -signs of decreased red blood cells - unusually weak or tired, fainting spells,  lightheadedness -breathing problems -confused, not responsive -chest pain -fast, irregular heartbeat -feeling faint or lightheaded, falls -mouth sores -redness, blistering, peeling or loosening of the skin, including inside the mouth -stomach pain -swelling of the ankles, feet, or hands -trouble passing urine or change in the amount of urine Side effects that usually do not require medical attention (report to your doctor or health care professional if they continue or are bothersome): -anxiety -headache -loss of appetite -muscle aches -nausea -night sweats This list may not describe all possible side effects. Call your doctor for medical advice about side effects. You may report side effects to FDA at 1-800-FDA-1088. Where should I keep my medicine? This drug is given in a hospital or clinic and will not be stored at home. NOTE: This sheet is a summary. It may not cover all possible information. If you have questions about this medicine, talk to your doctor, pharmacist, or health care provider.  2017 Elsevier/Gold Standard (2016-05-01 17:23:26)  Rasburicase Injection What is this medicine? RASBURICASE (ras BURE i kase) breaks down uric acid in the blood. It is used to prevent and to treat high levels of uric acid caused by cancer treatment. This medicine may be used for other purposes; ask your health care provider or pharmacist if you have questions. COMMON BRAND NAME(S): Elitek What should I tell my health care provider before I take this medicine? They need to know if you have any of these  conditions: -G6PD deficiency -history of anemia -history of blood transfusion -an unusual or allergic reaction to rasburicase, yeast, mannitol, other medicines, foods, dyes, or preservatives -pregnant or trying to get pregnant -breast-feeding How should I use this medicine? This medicine is for infusion into a vein. It is given by a health care professional in a hospital or clinic  setting. Talk to your pediatrician regarding the use of this medicine in children. While this drug may be prescribed for children as young as 15 month old for selected conditions, precautions do apply. Overdosage: If you think you have taken too much of this medicine contact a poison control center or emergency room at once. NOTE: This medicine is only for you. Do not share this medicine with others. What if I miss a dose? This does not apply. What may interact with this medicine? Interactions have not been studied. Give your health care provider a list of all the medicines, herbs, non-prescription drugs, or dietary supplements you use. Also tell them if you smoke, drink alcohol, or use illegal drugs. Some items may interact with your medicine. This list may not describe all possible interactions. Give your health care provider a list of all the medicines, herbs, non-prescription drugs, or dietary supplements you use. Also tell them if you smoke, drink alcohol, or use illegal drugs. Some items may interact with your medicine. What should I watch for while using this medicine? Your condition will be monitored carefully while you are receiving this medicine. You will need to have regular blood tests during your treatment. What side effects may I notice from receiving this medicine? Side effects that you should report to your doctor or health care professional as soon as possible: -allergic reactions like skin rash, itching or hives, swelling of the face, lips, or tongue -blue color to lips or nailbeds -breathing problems -chest pain, tightness -confusion -fast, irregular heartbeat -feeling faint or lightheaded, falls -low blood pressure -seizures -unusually weak or tired -yellowing of the eyes or skin Side effects that usually do not require medical attention (report to your doctor or health care professional if they continue or are bothersome): -abdominal  pain -constipation -diarrhea -fever -headache -nausea, vomiting -swelling of the ankles, feet, hands This list may not describe all possible side effects. Call your doctor for medical advice about side effects. You may report side effects to FDA at 1-800-FDA-1088. Where should I keep my medicine? This drug is given in a hospital or clinic and will not be stored at home. NOTE: This sheet is a summary. It may not cover all possible information. If you have questions about this medicine, talk to your doctor, pharmacist, or health care provider.  2017 Elsevier/Gold Standard (2014-04-14 13:24:23)  [facility] Discharge Instructions for Patients Receiving Chemotherapy  Today you received the following chemotherapy agents:  Rituxan  To help prevent nausea and vomiting after your treatment, we encourage you to take your nausea medication as prescribed.   If you develop nausea and vomiting that is not controlled by your nausea medication, call the clinic.   BELOW ARE SYMPTOMS THAT SHOULD BE REPORTED IMMEDIATELY:  *FEVER GREATER THAN 100.5 F  *CHILLS WITH OR WITHOUT FEVER  NAUSEA AND VOMITING THAT IS NOT CONTROLLED WITH YOUR NAUSEA MEDICATION  *UNUSUAL SHORTNESS OF BREATH  *UNUSUAL BRUISING OR BLEEDING  TENDERNESS IN MOUTH AND THROAT WITH OR WITHOUT PRESENCE OF ULCERS  *URINARY PROBLEMS  *BOWEL PROBLEMS  UNUSUAL RASH Items with * indicate a potential emergency and should be followed  up as soon as possible.  Feel free to call the clinic you have any questions or concerns. The clinic phone number is [phone].  Please show the [facility] at check-in to the Emergency Department and triage nurse.  Rituximab injection What is this medicine? RITUXIMAB (ri TUX i mab) is a monoclonal antibody. It is used to treat non-Hodgkin lymphoma and chronic lymphocytic leukemia. It is also used to treat rheumatoid arthritis (RA). In RA, this medicine slows the  inflammatory process and help reduce joint pain and swelling. This medicine is often used with other cancer or arthritis medications. This medicine may be used for other purposes; ask your health care provider or pharmacist if you have questions. COMMON BRAND NAME(S): Rituxan What should I tell my health care provider before I take this medicine? They need to know if you have any of these conditions: -heart disease -infection (especially a virus infection such as hepatitis B, chickenpox, cold sores, or herpes) -immune system problems -irregular heartbeat -kidney disease -lung or breathing disease, like asthma -recently received or scheduled to receive a vaccine -an unusual or allergic reaction to rituximab, mouse proteins, other medicines, foods, dyes, or preservatives -pregnant or trying to get pregnant -breast-feeding How should I use this medicine? This medicine is for infusion into a vein. It is administered in a hospital or clinic by a specially trained health care professional. A special MedGuide will be given to you by the pharmacist with each prescription and refill. Be sure to read this information carefully each time. Talk to your pediatrician regarding the use of this medicine in children. This medicine is not approved for use in children. Overdosage: If you think you have taken too much of this medicine contact a poison control center or emergency room at once. NOTE: This medicine is only for you. Do not share this medicine with others. What if I miss a dose? It is important not to miss a dose. Call your doctor or health care professional if you are unable to keep an appointment. What may interact with this medicine? -cisplatin -medicines for blood pressure -some other medicines for arthritis -vaccines This list may not describe all possible interactions. Give your health care provider a list of all the medicines, herbs, non-prescription drugs, or dietary supplements you use.  Also tell them if you smoke, drink alcohol, or use illegal drugs. Some items may interact with your medicine. What should I watch for while using this medicine? Report any side effects that you notice during your treatment right away, such as changes in your breathing, fever, chills, dizziness or lightheadedness. These effects are more common with the first dose. Visit your prescriber or health care professional for checks on your progress. You will need to have regular blood work. Report any other side effects. The side effects of this medicine can continue after you finish your treatment. Continue your course of treatment even though you feel ill unless your doctor tells you to stop. Call your doctor or health care professional for advice if you get a fever, chills or sore throat, or other symptoms of a cold or flu. Do not treat yourself. This drug decreases your body's ability to fight infections. Try to avoid being around people who are sick. This medicine may increase your risk to bruise or bleed. Call your doctor or health care professional if you notice any unusual bleeding. Be careful brushing and flossing your teeth or using a toothpick because you may get an infection or bleed  more easily. If you have any dental work done, tell your dentist you are receiving this medicine. Avoid taking products that contain aspirin, acetaminophen, ibuprofen, naproxen, or ketoprofen unless instructed by your doctor. These medicines may hide a fever. Do not become pregnant while taking this medicine. Women should inform their doctor if they wish to become pregnant or think they might be pregnant. There is a potential for serious side effects to an unborn child. Talk to your health care professional or pharmacist for more information. Do not breast-feed an infant while taking this medicine. What side effects may I notice from receiving this medicine? Side effects that you should report to your doctor or health care  professional as soon as possible: -allergic reactions like skin rash, itching or hives, swelling of the face, lips, or tongue -low blood counts - this medicine may decrease the number of white blood cells, red blood cells and platelets. You may be at increased risk for infections and bleeding. -signs of infection - fever or chills, cough, sore throat, pain or difficulty passing urine -signs of decreased platelets or bleeding - bruising, pinpoint red spots on the skin, black, tarry stools, blood in the urine -signs of decreased red blood cells - unusually weak or tired, fainting spells, lightheadedness -breathing problems -confused, not responsive -chest pain -fast, irregular heartbeat -feeling faint or lightheaded, falls -mouth sores -redness, blistering, peeling or loosening of the skin, including inside the mouth -stomach pain -swelling of the ankles, feet, or hands -trouble passing urine or change in the amount of urine Side effects that usually do not require medical attention (report to your doctor or health care professional if they continue or are bothersome): -anxiety -headache -loss of appetite -muscle aches -nausea -night sweats This list may not describe all possible side effects. Call your doctor for medical advice about side effects. You may report side effects to FDA at 1-800-FDA-1088. Where should I keep my medicine? This drug is given in a hospital or clinic and will not be stored at home. NOTE: This sheet is a summary. It may not cover all possible information. If you have questions about this medicine, talk to your doctor, pharmacist, or health care provider.  2017 Elsevier/Gold Standard (2016-05-01 17:23:26) Idelalisib tablets What is this medicine? IDELALISIB (eye del AL isib) is a medicine that targets proteins in cancer cells and stops the cancer cells from growing. It is used to treat chronic lymphocytic leukemia, follicular B-cell non-Hodgkin lymphoma, and small  lymphocytic lymphoma. It is not used as first-line therapy. COMMON BRAND NAME(S): ZYDELIG What should I tell my health care provider before I take this medicine? They need to know if you have any of these conditions: -infection -liver disease -lung or breathing disease, like asthma -an unusual or allergic reaction to idelalisib, other medicines, foods, dyes, or preservative -pregnant, or trying to get pregnant -breast-feeding How should I use this medicine? Take this medicine by mouth with a glass of water. Follow the directions on the prescription label. Do not cut, crush or chew this medicine. Take your medicine at regular intervals. Do not take it more often than directed. Do not stop taking except on your doctor's advice. Talk to your pediatrician regarding the use of this medicine in children. Special care may be needed. What if I miss a dose? If you miss a dose, take it as soon as you can. If your next dose is to be taken in less than 6 hours, then do not take the missed dose.  Take the next dose at your regular time. Do not take double or extra doses. What may interact with this medicine? Do not take this medicine with any of the following medications: -atorvastatin -carbamazepine -cerivastatin -cisapride -dasatinib -dihydroergotamine -doxorubicin -dronedarone -eletriptan -enzalutamide -ergotamine -fosphenytoin -halofantrine -ketoconazole -lovastatin -midazolam -nefazodone -nelfinavir -nifedipine -phenobarbital -phenytoin -pimozide -primidone -propafenone -quetiapine -quinidine -ranolazine -rifabutin -rifampin -rilpivirine -risperidone -ritonavir -sildosin -simvastatin -sirolimus -St. John's Wort, Hypericum perforatum -vinblastine -ziprasidone This medicine may also interact with the following medications: -certain medicines for blood pressure, heart disease, irregular heart beat -certain medicines for blood pressure like amlodipine, felodipine,  nifedipine -certain medicines for cholesterol like atorvastatin, lovastatin, and simvastatin -certain medicines for depression, anxiety, or psychotic disturbances -certain medicines for erectile dysfunction -certain medicines that treat or prevent blood clots like warfarin, enoxaparin, dalteparin, apixaban, dabigatran, and rivaroxaban -ergot alkaloids like dihydroergotamine, ergotamine -narcotic medicines for pain What should I watch for while using this medicine? Do not become pregnant while taking this medicine or for at least 1 month after stopping it. Women should inform their doctor if they wish to become pregnant or think they might be pregnant. There is a potential for serious side effects to an unborn child. Talk to your health care professional or pharmacist for more information. Do not breast-feed an infant while taking this medicine. This medicine may increase your risk to bruise or bleed. Call your doctor or health care professional if you notice any unusual bleeding. Call your doctor or health care professional for advice if you get a fever, chills or sore throat, or other symptoms of a cold or flu. Do not treat yourself. This drug decreases your body's ability to fight infections. Try to avoid being around people who are sick. You may need blood work done while you are taking this medicine. What side effects may I notice from receiving this medicine? Side effects that you should report to your doctor or health care professional as soon as possible: -allergic reactions like skin rash, itching or hives, swelling of the face, lips, or tongue -breathing problems like shortness of breath -diarrhea -low blood counts - this medicine may decrease the number of white blood cells, red blood cells and platelets. You may be at increased risk for infections and bleeding -redness, blistering, peeling or loosening of the skin, including inside the mouth -signs and symptoms of bleeding such as  bloody or black, tarry stools; red or dark-[place]aver urine; spitting up blood or Eckhart material that looks like coffee grounds; red spots on the skin; unusual bruising or bleeding from the eye, gums, or nose -signs of infection - fever or chills, cough, sore throat, pain or difficulty passing urine -signs and symptoms of liver injury like dark yellow or [place] urine; general ill feeling or flu-like symptoms; light-colored stools; loss of appetite; nausea; right upper belly pain; unusually weak or tired; yellowing of the eyes or skin -stomach pain Side effects that usually do not require medical attention (report to your doctor or health care professional if they continue or are bothersome): -cough -nausea -tiredness Where should I keep my medicine? Keep out of the reach of children. Store between 20 and 30 degrees C (68 and 86 degrees F). Keep this medicine in the original container. Throw away any unused medicine after the expiration date.  2017 Elsevier/Gold Standard (2015-11-22 11:39:29)

## 2016-11-19 ENCOUNTER — Encounter: Payer: Self-pay | Admitting: Pharmacist

## 2016-11-19 NOTE — Addendum Note (Signed)
Addended by: Tora Kindred on: 11/19/2016 01:57 PM   Modules accepted: Orders

## 2016-11-20 NOTE — Progress Notes (Signed)
Angelica Sullivan was seen today in neurologic consultation at the request of Angelica Reel, MD.  The patient is accompanied by her husband who supplements the history.  The patient experienced a fall on August 25; she had been outside cleaning in the heat and was going up an incline and she fell and she hit her head and hit her tailbone.  No LOC.  Her husband helped her up.  A few days later, she went to take a shower and her husband noted she didn't follow the normal routine (she didn't get her shower cap, towel, etc).  When she went to get out of the shower she grabbed a towel but asked her husband where the towel was.  She then walked over to a stack of towels and pulled the phone on top of her foot.  She felt a bit confused but her husband states that was the "end" of the event. Her speech was clear.  Just a few days later on August 29, she had just got up from her nap.   She told her husband that she didn't feel well (nausea).  She got up and before she even got into the bedroom door, she had pulled down her pants to go to the bathroom even though she was no where near the bathroom.  Her husband then heard a thump and she was in her closet and sat on the floor with pants down.  He asked her if she fell and she said "no, I have to use the bathroom."  She urinated on the floor and she asked for toilet paper.  She was then back to normal.  She states today that she remembers the entire event but her husband states that she didn't remember it until he relayed the story a few times.  No history of similar previously.  No events since that time.  That same day she had a CT of the head without contrast which I reviewed.  There was nothing acute on this examination.  There was atrophy and small vessel disease.  Her chemotherapy dose was subsequently decreased.  She was sent here for evaluation of possible TIA.  She had an EKG that was unremarkable for atrial fibrillation.  08/09/15 update:  The patient returns today  for follow-up, accompanied by her husband who supplements the history.  The patient had an MRI of the brain since last visit on 07/19/2015.  There is evidence of very significant cerebral small vessel disease, but it was otherwise unremarkable and nonacute.  She had a routine EEG, followed by a 24 hour ambulatory EEG.  Both of these demonstrated evidence of left temporal slowing, without evidence of epileptiform activity.  The patient has had no further events since our last visit.  12/11/15 update:  The patient follows up today, accompanied by her husband who supplements the history.  I have reviewed prior records made available to me.  Last visit, I started the patient on Keppra for what sounded like complex partial seizures.  She is on 500 mg twice a day.  She has had no further events.  She has complained of fatigue and thinks that it could be due to the [place].  States that she initially felt that things were "not clear" and not cognitively right but she feels better now.   However, she has been neutropenic and recently chemotherapy was held because of this.  She states that she also had the flu and had an "infection" of the bone marrow.  05/23/16 update:  The patient follows up today, accompanied by her husband who supplements the history.  I have reviewed prior records made available to me.  She is now on Keppra XR, 500 mg, 2 tablets at night.  This was changed because the patient complained of fatigue, but I told her I doubted that was from [place].  She does think she is doing better with this but she has had no further spells.   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  She has been struggling with skin rash and dry skin.  She has been moisturizing with vaseline and that has helped some.  She saw derm for the rash but the med was $650 for 8oz tube so she didn't use it.  Planning to have cataract surgery in [facility] in mid august.  11/24/16 update:  Pt f/u today,  accompanied by her husband who supplements the history.  The records that were made available to me were reviewed.  Still on keppra XR, 500 mg, 2 tablet at night. No further episodes of alteration in awareness.  Now on Rituxan/Idelalisib combo treatment for her CLL.  Only started the rituxan part as awaiting prior auth for idelalisib.  Overall feels well.       ALLERGIES:   Allergies  Allergen Reactions  . Bactrim [Sulfamethoxazole W/Trimethoprim (Co-Trimoxazole)] Diarrhea and Other (See Comments)    Abdominal cramping    CURRENT MEDICATIONS:  Outpatient Encounter Prescriptions as of 11/24/2016  Medication Sig  . acyclovir (ZOVIRAX) 400 MG tablet Take 1 tablet (400 mg total) by mouth daily.  Marland Kitchen allopurinol (ZYLOPRIM) 300 MG tablet Take 1 tablet (300 mg total) by mouth daily.  Marland Kitchen atorvastatin (LIPITOR) 20 MG tablet Take 20 mg by mouth every evening.   . calcium citrate-vitamin D (CITRACAL+D) 315-200 MG-UNIT per tablet Take 1 tablet by mouth daily.   . Cholecalciferol (VITAMIN D) 400 UNITS capsule Take 1,000 Units by mouth at bedtime.   . diphenhydrAMINE (BENADRYL) 25 mg capsule Take 50 mg by mouth at bedtime as needed for allergies (allergies).   Adora Fridge VAGINAL 0.1 MG/GM vaginal cream Place 1 Applicatorful vaginally 2 (two) times a week.   . folic acid (FOLVITE) Q000111Q MCG tablet Take 400 mcg by mouth daily.  Marland Kitchen glucosamine-chondroitin 500-400 MG tablet Take 1 tablet by mouth daily.    . idelalisib (ZYDELIG) 150 MG tablet Take 1 tablet (150 mg total) by mouth 2 (two) times daily.  Marland Kitchen levETIRAcetam (KEPPRA XR) 500 MG 24 hr tablet TAKE 2 TABLETS BY MOUTH  DAILY  . lidocaine-prilocaine (EMLA) cream Apply 1 application topically as needed.  . loratadine (CLARITIN) 10 MG tablet Take 10 mg by mouth daily.    . montelukast (SINGULAIR) 10 MG tablet Take 10 mg by mouth daily.   . Multiple Vitamin (MULTIVITAMIN) tablet Take 1 tablet by mouth daily.    . polyvinyl alcohol (ARTIFICIAL TEARS) 1.4 %  ophthalmic solution Place 1 drop into both eyes 3 (three) times daily as needed for dry eyes (dry eyes). Reported on 03/10/2016  . sodium chloride (OCEAN) 0.65 % SOLN nasal spray Place 1 spray into both nostrils 2 (two) times daily.   . [DISCONTINUED] Echinacea 400 MG CAPS Take 1 capsule by mouth 2 (two) times daily.     No facility-administered encounter medications on file as of 11/24/2016.     PAST MEDICAL HISTORY:   Past Medical History:  Diagnosis Date  . Confusion 07/03/2015  . GERD (gastroesophageal reflux disease)   .  Hyperlipidemia   . Hypertension   . Hypogammaglobulinemia, acquired ([place]) 02/05/2016  . Irritable bowel   . Leukemia ([place]) 08/06/09   CLL  . Leukemia, chronic lymphoid ([place]) 09/15/2011  . Leukopenia 01/16/2014  . Osteoporosis   . Rhinitis, chronic 09/15/2011  . Thrombocytopenia ([place]) 09/15/2011  . TIA (transient ischemic attack) 07/05/2015    PAST SURGICAL HISTORY:   Past Surgical History:  Procedure Laterality Date  . ANTERIOR AND POSTERIOR VAGINAL REPAIR    . ANTERIOR AND POSTERIOR VAGINAL REPAIR     repeat  . broncoscopy    . HEMORRHOID SURGERY    . LAPAROSCOPIC CHOLECYSTECTOMY    . TONSILLECTOMY AND ADENOIDECTOMY    . VAGINAL HYSTERECTOMY      SOCIAL HISTORY:   Social History   Social History  . Marital status: Married    Spouse name: N/A  . Number of children: N/A  . Years of education: N/A   Occupational History  . Not on file.   Social History Main Topics  . Smoking status: Never Smoker  . Smokeless tobacco: Never Used  . Alcohol use No  . Drug use: No  . Sexual activity: No   Other Topics Concern  . Not on file   Social History Narrative  . No narrative on file    FAMILY HISTORY:   Family Status  Relation Status  . Mother Deceased   pneumonia  . Father Deceased   unknown  . Sister Alive   lung cancer, mitral valve  . Sister Alive   healthy  . Brother Deceased at age 56  . Brother Deceased   memory issues    ROS:  A  complete 10 system review of systems was obtained and was unremarkable apart from what is mentioned above.  PHYSICAL EXAMINATION:    VITALS:   Vitals:   11/24/16 1126  BP: 120/60  Pulse: 86  Weight: 115 lb (52.2 kg)  Height: 5' (1.524 m)    GEN:  Normal appears female in no acute distress.  Appears stated age. HEENT:  Normocephalic.  The mucous membranes are moist. The superficial temporal arteries are without ropiness or tenderness. Cardiovascular: Regular rate and rhythm. Lungs: Clear to auscultation bilaterally. Neck/Heme: There are no carotid bruits noted bilaterally. Skin:  She has lesions on the face (forehead), neck, hands that the dermatologist has just frozen.  NEUROLOGICAL: Orientation:  The patient is alert and oriented x 3.  Looks to her husband to answer detailed questions Cranial nerves: There is good facial symmetry. Marland Kitchen Speech is fluent and clear. Soft palate rises symmetrically and there is no tongue deviation. Hearing is intact to conversational tone. Tone: Tone is good throughout. Sensation: Sensation is intact to light touch  Coordination:  The patient has no difficulty with RAM's or FNF bilaterally. Motor: Strength is 5/5 in the bilateral upper and lower extremities.  Shoulder shrug is equal and symmetric. There is no pronator drift.  There are no fasciculations noted. Gait and Station: The patient is able to ambulate without difficulty.     IMPRESSION/PLAN  1.  Transient alteration of awareness  -The patient has now had 2 events that are suspicious for complex partial seizures.    -continue Keppra XR 500 mg - 2 po daily.  No events since starting this and would recommend continuing since no SE either. 2.  F/u in 7 months, sooner should new neurology issues arise.  Much greater than 50% of this visit was spent in counseling with the patient and  the family.  Total face to face time:  20 min

## 2016-11-21 ENCOUNTER — Telehealth: Payer: Self-pay

## 2016-11-21 NOTE — Telephone Encounter (Signed)
Contacted [employer] as to the status of prescriiption and they informed me the copay is $2875 and they are applying for copay assistance and will let us know when they have completed the process.  Thank you  Henreitta Leber, PharmD [facility]

## 2016-11-24 ENCOUNTER — Encounter: Payer: Self-pay | Admitting: Neurology

## 2016-11-24 ENCOUNTER — Ambulatory Visit (INDEPENDENT_AMBULATORY_CARE_PROVIDER_SITE_OTHER): Payer: Medicare Other | Admitting: Neurology

## 2016-11-24 VITALS — BP 120/60 | HR 86 | Ht 60.0 in | Wt 115.0 lb

## 2016-11-24 DIAGNOSIS — R404 Transient alteration of awareness: Secondary | ICD-10-CM

## 2016-11-27 NOTE — Telephone Encounter (Signed)
[employer] received grant for pt from [employer] in amt of $[phone] from 08/25/16 - 9/31/18. Pts case is w/ benefits team now.  Next the pharmacy will review case and if no issues, will call pt and arrange delivery of her Zydelig.  Kennith Center, Pharm.D., CPP 11/27/2016@1 :[facility]

## 2016-12-01 ENCOUNTER — Other Ambulatory Visit (HOSPITAL_BASED_OUTPATIENT_CLINIC_OR_DEPARTMENT_OTHER): Payer: Medicare Other

## 2016-12-01 ENCOUNTER — Encounter: Payer: Self-pay | Admitting: Hematology and Oncology

## 2016-12-01 ENCOUNTER — Ambulatory Visit: Payer: Medicare Other

## 2016-12-01 ENCOUNTER — Telehealth: Payer: Self-pay | Admitting: *Deleted

## 2016-12-01 ENCOUNTER — Telehealth: Payer: Self-pay | Admitting: Hematology and Oncology

## 2016-12-01 ENCOUNTER — Ambulatory Visit (HOSPITAL_BASED_OUTPATIENT_CLINIC_OR_DEPARTMENT_OTHER): Payer: Medicare Other | Admitting: Hematology and Oncology

## 2016-12-01 ENCOUNTER — Ambulatory Visit (HOSPITAL_BASED_OUTPATIENT_CLINIC_OR_DEPARTMENT_OTHER): Payer: Medicare Other

## 2016-12-01 ENCOUNTER — Other Ambulatory Visit: Payer: Self-pay | Admitting: Hematology and Oncology

## 2016-12-01 VITALS — BP 117/50 | HR 79 | Temp 97.6°F | Resp 17 | Ht 60.0 in | Wt 115.2 lb

## 2016-12-01 VITALS — BP 130/50 | HR 66 | Temp 97.8°F | Resp 18

## 2016-12-01 DIAGNOSIS — C9110 Chronic lymphocytic leukemia of B-cell type not having achieved remission: Secondary | ICD-10-CM

## 2016-12-01 DIAGNOSIS — Z5112 Encounter for antineoplastic immunotherapy: Secondary | ICD-10-CM

## 2016-12-01 DIAGNOSIS — R300 Dysuria: Secondary | ICD-10-CM | POA: Insufficient documentation

## 2016-12-01 DIAGNOSIS — K111 Hypertrophy of salivary gland: Secondary | ICD-10-CM

## 2016-12-01 DIAGNOSIS — J309 Allergic rhinitis, unspecified: Secondary | ICD-10-CM

## 2016-12-01 DIAGNOSIS — D801 Nonfamilial hypogammaglobulinemia: Secondary | ICD-10-CM

## 2016-12-01 DIAGNOSIS — D696 Thrombocytopenia, unspecified: Secondary | ICD-10-CM | POA: Diagnosis not present

## 2016-12-01 LAB — COMPREHENSIVE METABOLIC PANEL
ALK PHOS: 53 U/L (ref 40–150)
ALT: 14 U/L (ref 0–55)
ANION GAP: 9 meq/L (ref 3–11)
AST: 22 U/L (ref 5–34)
Albumin: 3.9 g/dL (ref 3.5–5.0)
BILIRUBIN TOTAL: 0.65 mg/dL (ref 0.20–1.20)
BUN: 15.2 mg/dL (ref 7.0–26.0)
CALCIUM: 9.8 mg/dL (ref 8.4–10.4)
CO2: 22 mEq/L (ref 22–29)
CREATININE: 0.9 mg/dL (ref 0.6–1.1)
Chloride: 106 mEq/L (ref 98–109)
EGFR: 60 mL/min/{1.73_m2} — AB (ref >90)
Glucose: 94 mg/dl (ref 70–140)
Potassium: 3.8 mEq/L (ref 3.5–5.1)
Sodium: 137 mEq/L (ref 136–145)
TOTAL PROTEIN: 6.6 g/dL (ref 6.4–8.3)

## 2016-12-01 LAB — CBC WITH DIFFERENTIAL/PLATELET
BASO%: 2.9 % — ABNORMAL HIGH (ref 0.0–2.0)
BASOS ABS: 0.2 10*3/uL — AB (ref 0.0–0.1)
EOS%: 2.2 % (ref 0.0–7.0)
Eosinophils Absolute: 0.2 10*3/uL (ref 0.0–0.5)
HEMATOCRIT: 36.9 % (ref 34.8–46.6)
HGB: 12.4 g/dL (ref 11.6–15.9)
LYMPH%: 41.6 % (ref 14.0–49.7)
MCH: 31 pg (ref 25.1–34.0)
MCHC: 33.7 g/dL (ref 31.5–36.0)
MCV: 92.1 fL (ref 79.5–101.0)
MONO#: 0.7 10*3/uL (ref 0.1–0.9)
MONO%: 8.7 % (ref 0.0–14.0)
NEUT#: 3.3 10*3/uL (ref 1.5–6.5)
NEUT%: 44.6 % (ref 38.4–76.8)
Platelets: 76 10*3/uL — ABNORMAL LOW (ref 145–400)
RBC: 4.01 10*6/uL (ref 3.70–5.45)
RDW: 14 % (ref 11.2–14.5)
WBC: 7.5 10*3/uL (ref 3.9–10.3)
lymph#: 3.1 10*3/uL (ref 0.9–3.3)

## 2016-12-01 LAB — URINALYSIS, MICROSCOPIC - CHCC
Bilirubin (Urine): NEGATIVE
GLUCOSE UR CHCC: NEGATIVE mg/dL
Ketones: NEGATIVE mg/dL
NITRITE: POSITIVE
PH: 6 (ref 4.6–8.0)
Protein: <30 mg/dL
SPECIFIC GRAVITY, URINE: 1.005 (ref 1.003–1.035)
UROBILINOGEN UR: 0.2 mg/dL (ref 0.2–1)

## 2016-12-01 LAB — LACTATE DEHYDROGENASE: LDH: 178 U/L (ref 125–245)

## 2016-12-01 LAB — URIC ACID: URIC ACID, SERUM: 3.7 mg/dL (ref 2.6–7.4)

## 2016-12-01 MED ORDER — ONDANSETRON HCL 8 MG PO TABS: 8.0000 mg | ORAL_TABLET | Freq: Three times a day (TID) | ORAL | 3 refills | Status: DC | PRN

## 2016-12-01 MED ORDER — HEPARIN SOD (PORK) LOCK FLUSH 100 UNIT/ML IV SOLN
500.0000 [IU] | Freq: Once | INTRAVENOUS | Status: AC | PRN
  Administered 2016-12-01: 500 [IU]
  Filled 2016-12-01: qty 5

## 2016-12-01 MED ORDER — DIPHENHYDRAMINE HCL 25 MG PO CAPS
ORAL_CAPSULE | ORAL | Status: AC
  Filled 2016-12-01: qty 1

## 2016-12-01 MED ORDER — DIPHENHYDRAMINE HCL 25 MG PO CAPS
50.0000 mg | ORAL_CAPSULE | Freq: Once | ORAL | Status: AC
  Administered 2016-12-01: 50 mg via ORAL

## 2016-12-01 MED ORDER — SODIUM CHLORIDE 0.9 % IV SOLN
Freq: Once | INTRAVENOUS | Status: AC
  Administered 2016-12-01: 10:00:00 via INTRAVENOUS

## 2016-12-01 MED ORDER — SODIUM CHLORIDE 0.9 % IV SOLN
500.0000 mg/m2 | Freq: Once | INTRAVENOUS | Status: AC
  Administered 2016-12-01: 800 mg via INTRAVENOUS
  Filled 2016-12-01: qty 50

## 2016-12-01 MED ORDER — AMOXICILLIN 500 MG PO CAPS: 500.0000 mg | ORAL_CAPSULE | Freq: Two times a day (BID) | ORAL | 0 refills | Status: DC

## 2016-12-01 MED ORDER — ACETAMINOPHEN 325 MG PO TABS
ORAL_TABLET | ORAL | Status: AC
Start: 2016-12-01 — End: 2016-12-01
  Filled 2016-12-01: qty 2

## 2016-12-01 MED ORDER — SODIUM CHLORIDE 0.9% FLUSH
10.0000 mL | Freq: Once | INTRAVENOUS | Status: AC
  Administered 2016-12-01: 10 mL
  Filled 2016-12-01: qty 10

## 2016-12-01 MED ORDER — ACETAMINOPHEN 325 MG PO TABS
650.0000 mg | ORAL_TABLET | Freq: Once | ORAL | Status: AC
  Administered 2016-12-01: 650 mg via ORAL

## 2016-12-01 MED ORDER — SODIUM CHLORIDE 0.9% FLUSH
10.0000 mL | INTRAVENOUS | Status: DC | PRN
  Administered 2016-12-01: 10 mL
  Filled 2016-12-01: qty 10

## 2016-12-01 NOTE — Assessment & Plan Note (Signed)
She has mild discomfort. It could be related to this salivary gland infection versus parotic duct obstruction. Recommend conservative management for now.

## 2016-12-01 NOTE — Assessment & Plan Note (Signed)
She continues to have symptoms of chronic rhinitis. Continue conservative management.

## 2016-12-01 NOTE — Telephone Encounter (Signed)
Message sent to chemo scheduler to be added per 12/01/16 los.

## 2016-12-01 NOTE — Progress Notes (Signed)
[facility] [place] [place] OFFICE PROGRESS NOTE  Patient Care Team: Shon Baton, MD as PCP - General (Internal Medicine) Heath Lark, MD as Consulting Physician (Hematology and Oncology) Eustace Quail Tat, DO as Consulting Physician (Neurology)  SUMMARY OF ONCOLOGIC HISTORY: Oncology History   CLL stage IV, deletion 13q     CLL (chronic lymphocytic leukemia) ([place])   12/12/2013 Bone Marrow Biopsy    Bone marrow biopsy show significant involvement of CLL      12/13/2013 Procedure    Patient had placement of a port      12/14/2013 Imaging    CT scan of the chest, abdomen and pelvis showed significant lymphadenopathy and splenomegaly      12/20/2013 - 12/22/2013 Hospital Admission    The patient was admitted to the hospital to begin cycle 1 day 1 of chemotherapy due to risk of infusion reaction. She tolerated treatment well without major side effects.      12/20/2013 - 03/13/2014 Chemotherapy    started on cycle 1 of Obinutuzumab, chlorambucil and prednisone. All treatment placed on hold on 03/13/2014 due to persistent pancytopenia.      03/28/2014 Imaging    Repeat imaging study showed excellent response to treatment.      10/25/2014 Imaging    She has persistent mild lymphadenopathy and splenomegaly, improved compared to previous exam      04/23/2015 Imaging    Ct scan showed disease progression      05/09/2015 - 11/26/2015 Chemotherapy    She started taking ibrutinib.      11/26/2015 Adverse Reaction    Treatment  was placed on hold due to neutropenia and recent infection      02/11/2016 - 10/10/2016 Chemotherapy    She is started on monthly IVIG Rx for recurrent infection and acquired panhypogammaglobulinemia      11/17/2016 -  Chemotherapy    The patient is started on Rituximab and Idelisib       INTERVAL HISTORY: Please see below for problem oriented charting. She returns prior to cycle 2 of treatment. She tolerated cycle 1 well. Oral chemotherapy has just been approved  and hopefully will be delivered this week. She has noted regression of lymphadenopathy. She complained of some symptoms of dysuria, frequency but no urgency. Denies nausea or vomiting. Her appetite is stable. She had mild chronic diarrhea. She has noticed mild left parotic gland enlargement recently  REVIEW OF SYSTEMS:   Constitutional: Denies fevers, chills or abnormal weight loss Eyes: Denies blurriness of vision Ears, nose, mouth, throat, and face: Denies mucositis or sore throat Respiratory: Denies cough, dyspnea or wheezes Cardiovascular: Denies palpitation, chest discomfort or lower extremity swelling Skin: Denies abnormal skin rashes Neurological:Denies numbness, tingling or new weaknesses Behavioral/Psych: Mood is stable, no new changes  All other systems were reviewed with the patient and are negative.  I have reviewed the past medical history, past surgical history, social history and family history with the patient and they are unchanged from previous note.  ALLERGIES:  is allergic to bactrim [sulfamethoxazole w/trimethoprim (co-trimoxazole)].  MEDICATIONS:  Current Outpatient Prescriptions  Medication Sig Dispense Refill  . acyclovir (ZOVIRAX) 400 MG tablet Take 1 tablet (400 mg total) by mouth daily. 90 tablet 3  . allopurinol (ZYLOPRIM) 300 MG tablet Take 1 tablet (300 mg total) by mouth daily. 30 tablet 0  . atorvastatin (LIPITOR) 20 MG tablet Take 20 mg by mouth every evening.     . calcium citrate-vitamin D (CITRACAL+D) 315-200 MG-UNIT per tablet Take 1 tablet  by mouth daily.     . Cholecalciferol (VITAMIN D) 400 UNITS capsule Take 1,000 Units by mouth at bedtime.     . diphenhydrAMINE (BENADRYL) 25 mg capsule Take 50 mg by mouth at bedtime as needed for allergies (allergies).     Adora Fridge VAGINAL 0.1 MG/GM vaginal cream Place 1 Applicatorful vaginally 2 (two) times a week.     . folic acid (FOLVITE) 789 MCG tablet Take 400 mcg by mouth daily.    Marland Kitchen  glucosamine-chondroitin 500-400 MG tablet Take 1 tablet by mouth daily.      Marland Kitchen levETIRAcetam (KEPPRA XR) 500 MG 24 hr tablet TAKE 2 TABLETS BY MOUTH  DAILY 180 tablet 1  . lidocaine-prilocaine (EMLA) cream Apply 1 application topically as needed. 30 g 6  . loratadine (CLARITIN) 10 MG tablet Take 10 mg by mouth daily.      . montelukast (SINGULAIR) 10 MG tablet Take 10 mg by mouth daily.     . Multiple Vitamin (MULTIVITAMIN) tablet Take 1 tablet by mouth daily.      . polyvinyl alcohol (ARTIFICIAL TEARS) 1.4 % ophthalmic solution Place 1 drop into both eyes 3 (three) times daily as needed for dry eyes (dry eyes). Reported on 03/10/2016    . idelalisib (ZYDELIG) 150 MG tablet Take 1 tablet (150 mg total) by mouth 2 (two) times daily. (Patient not taking: Reported on 12/01/2016) 60 tablet 11  . ondansetron (ZOFRAN) 8 MG tablet Take 1 tablet (8 mg total) by mouth every 8 (eight) hours as needed for nausea. 30 tablet 3  . sodium chloride (OCEAN) 0.65 % SOLN nasal spray Place 1 spray into both nostrils 2 (two) times daily.      No current facility-administered medications for this visit.     PHYSICAL EXAMINATION: ECOG PERFORMANCE STATUS: 1 - Symptomatic but completely ambulatory  Vitals:   12/01/16 0855 12/01/16 0856  BP: (!) 128/48 (!) 117/50  Pulse: 79   Resp: 17   Temp: 97.6 F (36.4 C)    Filed Weights   12/01/16 0855  Weight: 115 lb 3.2 oz (52.3 kg)    GENERAL:alert, no distress and comfortable SKIN: skin color, texture, turgor are normal, no rashes or significant lesions EYES: normal, Conjunctiva are pink and non-injected, sclera clear OROPHARYNX:no exudate, no erythema and lips, buccal mucosa, and tongue normal . Noted left parotid gland enlargement NECK: supple, thyroid normal size, non-tender, without nodularity LYMPH: She has persistent lymphadenopathy throughout, regressed in size compared to prior visit LUNGS: clear to auscultation and percussion with normal breathing  effort HEART: regular rate & rhythm and no murmurs and no lower extremity edema ABDOMEN:abdomen soft, non-tender and normal bowel sounds Musculoskeletal:no cyanosis of digits and no clubbing  NEURO: alert & oriented x 3 with fluent speech, no focal motor/sensory deficits  LABORATORY DATA:  I have reviewed the data as listed    Component Value Date/Time   NA 137 12/01/2016 0801   K 3.8 12/01/2016 0801   CL 109 05/13/2016 1238   CL 99 04/08/2013 0922   CO2 22 12/01/2016 0801   GLUCOSE 94 12/01/2016 0801   GLUCOSE 92 04/08/2013 0922   BUN 15.2 12/01/2016 0801   CREATININE 0.9 12/01/2016 0801   CALCIUM 9.8 12/01/2016 0801   PROT 6.6 12/01/2016 0801   ALBUMIN 3.9 12/01/2016 0801   AST 22 12/01/2016 0801   ALT 14 12/01/2016 0801   ALKPHOS 53 12/01/2016 0801   BILITOT 0.65 12/01/2016 0801   GFRNONAA 53 (L) 05/13/2016 1238  GFRAA >60 05/13/2016 1238    No results found for: SPEP, UPEP  Lab Results  Component Value Date   WBC 7.5 12/01/2016   NEUTROABS 3.3 12/01/2016   HGB 12.4 12/01/2016   HCT 36.9 12/01/2016   MCV 92.1 12/01/2016   PLT 76 (L) 12/01/2016      Chemistry      Component Value Date/Time   NA 137 12/01/2016 0801   K 3.8 12/01/2016 0801   CL 109 05/13/2016 1238   CL 99 04/08/2013 0922   CO2 22 12/01/2016 0801   BUN 15.2 12/01/2016 0801   CREATININE 0.9 12/01/2016 0801      Component Value Date/Time   CALCIUM 9.8 12/01/2016 0801   ALKPHOS 53 12/01/2016 0801   AST 22 12/01/2016 0801   ALT 14 12/01/2016 0801   BILITOT 0.65 12/01/2016 0801      ASSESSMENT & PLAN:  CLL (chronic lymphocytic leukemia) She tolerated rituximab treatment well. She denies reaction to Rituxan. She has felt that most of the lymphadenopathy has regressed in size. Her oral chemotherapy has been approved and hopefully will be delivered soon. She will start that immediately as soon as she has received it. Due to potential interaction with the medications, I recommend we hold  Lipitor on hold for now. I want her potential risk of diarrhea with oral chemotherapy. I recommend regular dose of Imodium daily. I plan to see her back in 4 weeks for further assessment and review of toxicity  Thrombocytopenia ([place]) She is not symptomatic apart from minor bruising. I will observe for now. This is likely due to disease and recent treatment. I will continue the chemotherapy at current dose without dosage adjustment.  If the thrombocytopenia gets progressive worse in the future, I might have to delay her treatment or adjust the chemotherapy dose.    Rhinitis, chronic She continues to have symptoms of chronic rhinitis. Continue conservative management.  Dysuria The patient complained of dysuria. I plan to order urinalysis and urine culture to exclude UTI  Parotid gland enlargement She has mild discomfort. It could be related to this salivary gland infection versus parotic duct obstruction. Recommend conservative management for now.   Orders Placed This Encounter  Procedures  . Urine culture    Standing Status:   Future    Standing Expiration Date:   01/05/2018  . Urinalysis, Microscopic - [facility]    Standing Status:   Future    Standing Expiration Date:   01/05/2018  . CBC with Differential/Platelet    Standing Status:   Standing    Number of Occurrences:   22    Standing Expiration Date:   12/01/2017   All questions were answered. The patient knows to call the clinic with any problems, questions or concerns. No barriers to learning was detected. I spent 30 minutes counseling the patient face to face. The total time spent in the appointment was 40 minutes and more than 50% was on counseling and review of test results     Heath Lark, MD 12/01/2016 9:34 AM

## 2016-12-01 NOTE — Telephone Encounter (Signed)
Per 1/29 LOS and staff message I have scheduled appts and gave a calendar to the patient. Notified the scheduler

## 2016-12-01 NOTE — Telephone Encounter (Signed)
Notified of message below

## 2016-12-01 NOTE — Telephone Encounter (Signed)
Appointments scheduled per 12/01/16 los. Patient was given a copy of the appointment schedule and AVS report, per 12/01/16 los. °

## 2016-12-01 NOTE — Assessment & Plan Note (Signed)
She tolerated rituximab treatment well. She denies reaction to Rituxan. She has felt that most of the lymphadenopathy has regressed in size. Her oral chemotherapy has been approved and hopefully will be delivered soon. She will start that immediately as soon as she has received it. Due to potential interaction with the medications, I recommend we hold Lipitor on hold for now. I want her potential risk of diarrhea with oral chemotherapy. I recommend regular dose of Imodium daily. I plan to see her back in 4 weeks for further assessment and review of toxicity

## 2016-12-01 NOTE — Assessment & Plan Note (Signed)
She is not symptomatic apart from minor bruising. I will observe for now. This is likely due to disease and recent treatment. I will continue the chemotherapy at current dose without dosage adjustment.  If the thrombocytopenia gets progressive worse in the future, I might have to delay her treatment or adjust the chemotherapy dose.

## 2016-12-01 NOTE — Assessment & Plan Note (Signed)
The patient complained of dysuria. I plan to order urinalysis and urine culture to exclude UTI

## 2016-12-01 NOTE — Patient Instructions (Addendum)
[facility] Discharge Instructions for Patients Receiving Chemotherapy  Today you received the following chemotherapy agents:  Rituximab   To help prevent nausea and vomiting after your treatment, we encourage you to take your nausea medication as prescribed.  If you develop nausea and vomiting that is not controlled by your nausea medication, call the clinic.   BELOW ARE SYMPTOMS THAT SHOULD BE REPORTED IMMEDIATELY:  *FEVER GREATER THAN 100.5 F  *CHILLS WITH OR WITHOUT FEVER  NAUSEA AND VOMITING THAT IS NOT CONTROLLED WITH YOUR NAUSEA MEDICATION  *UNUSUAL SHORTNESS OF BREATH  *UNUSUAL BRUISING OR BLEEDING  TENDERNESS IN MOUTH AND THROAT WITH OR WITHOUT PRESENCE OF ULCERS  *URINARY PROBLEMS  *BOWEL PROBLEMS  UNUSUAL RASH Items with * indicate a potential emergency and should be followed up as soon as possible.  Feel free to call the clinic you have any questions or concerns. The clinic phone number is [phone].  Please show the [facility] at check-in to the Emergency Department and triage nurse.  Rituximab injection What is this medicine? RITUXIMAB (ri TUX i mab) is a monoclonal antibody. It is used to treat non-Hodgkin lymphoma and chronic lymphocytic leukemia. It is also used to treat rheumatoid arthritis (RA). In RA, this medicine slows the inflammatory process and help reduce joint pain and swelling. This medicine is often used with other cancer or arthritis medications. This medicine may be used for other purposes; ask your health care provider or pharmacist if you have questions. COMMON BRAND NAME(S): Rituxan What should I tell my health care provider before I take this medicine? They need to know if you have any of these conditions: -heart disease -infection (especially a virus infection such as hepatitis B, chickenpox, cold sores, or herpes) -immune system problems -irregular heartbeat -kidney disease -lung or breathing disease, like  asthma -recently received or scheduled to receive a vaccine -an unusual or allergic reaction to rituximab, mouse proteins, other medicines, foods, dyes, or preservatives -pregnant or trying to get pregnant -breast-feeding How should I use this medicine? This medicine is for infusion into a vein. It is administered in a hospital or clinic by a specially trained health care professional. A special MedGuide will be given to you by the pharmacist with each prescription and refill. Be sure to read this information carefully each time. Talk to your pediatrician regarding the use of this medicine in children. This medicine is not approved for use in children. Overdosage: If you think you have taken too much of this medicine contact a poison control center or emergency room at once. NOTE: This medicine is only for you. Do not share this medicine with others. What if I miss a dose? It is important not to miss a dose. Call your doctor or health care professional if you are unable to keep an appointment. What may interact with this medicine? -cisplatin -medicines for blood pressure -some other medicines for arthritis -vaccines This list may not describe all possible interactions. Give your health care provider a list of all the medicines, herbs, non-prescription drugs, or dietary supplements you use. Also tell them if you smoke, drink alcohol, or use illegal drugs. Some items may interact with your medicine. What should I watch for while using this medicine? Report any side effects that you notice during your treatment right away, such as changes in your breathing, fever, chills, dizziness or lightheadedness. These effects are more common with the first dose. Visit your prescriber or health care professional for checks on your  progress. You will need to have regular blood work. Report any other side effects. The side effects of this medicine can continue after you finish your treatment. Continue your  course of treatment even though you feel ill unless your doctor tells you to stop. Call your doctor or health care professional for advice if you get a fever, chills or sore throat, or other symptoms of a cold or flu. Do not treat yourself. This drug decreases your body's ability to fight infections. Try to avoid being around people who are sick. This medicine may increase your risk to bruise or bleed. Call your doctor or health care professional if you notice any unusual bleeding. Be careful brushing and flossing your teeth or using a toothpick because you may get an infection or bleed more easily. If you have any dental work done, tell your dentist you are receiving this medicine. Avoid taking products that contain aspirin, acetaminophen, ibuprofen, naproxen, or ketoprofen unless instructed by your doctor. These medicines may hide a fever. Do not become pregnant while taking this medicine. Women should inform their doctor if they wish to become pregnant or think they might be pregnant. There is a potential for serious side effects to an unborn child. Talk to your health care professional or pharmacist for more information. Do not breast-feed an infant while taking this medicine. What side effects may I notice from receiving this medicine? Side effects that you should report to your doctor or health care professional as soon as possible: -allergic reactions like skin rash, itching or hives, swelling of the face, lips, or tongue -low blood counts - this medicine may decrease the number of white blood cells, red blood cells and platelets. You may be at increased risk for infections and bleeding. -signs of infection - fever or chills, cough, sore throat, pain or difficulty passing urine -signs of decreased platelets or bleeding - bruising, pinpoint red spots on the skin, black, tarry stools, blood in the urine -signs of decreased red blood cells - unusually weak or tired, fainting spells,  lightheadedness -breathing problems -confused, not responsive -chest pain -fast, irregular heartbeat -feeling faint or lightheaded, falls -mouth sores -redness, blistering, peeling or loosening of the skin, including inside the mouth -stomach pain -swelling of the ankles, feet, or hands -trouble passing urine or change in the amount of urine Side effects that usually do not require medical attention (report to your doctor or health care professional if they continue or are bothersome): -anxiety -headache -loss of appetite -muscle aches -nausea -night sweats This list may not describe all possible side effects. Call your doctor for medical advice about side effects. You may report side effects to FDA at 1-800-FDA-1088. Where should I keep my medicine? This drug is given in a hospital or clinic and will not be stored at home. NOTE: This sheet is a summary. It may not cover all possible information. If you have questions about this medicine, talk to your doctor, pharmacist, or health care provider.  2017 Elsevier/Gold Standard (2016-05-01 17:23:26) -i-

## 2016-12-01 NOTE — Patient Instructions (Signed)

## 2016-12-01 NOTE — Telephone Encounter (Signed)
-----   Message from Heath Lark, MD sent at 12/01/2016 12:02 PM EST ----- Regarding: urinalysis Prelim UA suspicious for UTI Culture would take 2 more days but given her symptoms and her probably parotid infection, I recommend we proceed with rx. I would recommend amoxicillin 500 mg BID PO x 7 days OK to call in to local pharmacy Will call her again once culture results are available ----- Message ----- From: Interface, [facility] Sent: 12/01/2016   8:52 AM To: Heath Lark, MD

## 2016-12-04 ENCOUNTER — Telehealth: Payer: Self-pay | Admitting: *Deleted

## 2016-12-04 LAB — URINE CULTURE

## 2016-12-04 NOTE — Telephone Encounter (Signed)
Pt states she started Idelalisib today. 12/04/16

## 2016-12-05 ENCOUNTER — Telehealth: Payer: Self-pay

## 2016-12-05 NOTE — Telephone Encounter (Signed)
Called patient and asked about symptom's of bladder infection. Patient stated she is 90% better. Parotid swelling is decreased per patient. States she feels much better.

## 2016-12-05 NOTE — Telephone Encounter (Signed)
-----   Message from Cathlean Cower, RN sent at 12/05/2016  8:48 AM EST ----- Regarding: FW: is she better   ----- Message ----- From: Heath Lark, MD Sent: 12/05/2016   8:03 AM To: Cathlean Cower, RN Subject: is she better                                  Is her symptoms from bladder infection and parotid swelling better? ----- Message ----- From: Interface, [facility] Sent: 12/01/2016   8:52 AM To: Heath Lark, MD

## 2016-12-15 ENCOUNTER — Ambulatory Visit: Payer: Medicare Other

## 2016-12-15 ENCOUNTER — Encounter: Payer: Self-pay | Admitting: Hematology and Oncology

## 2016-12-15 ENCOUNTER — Ambulatory Visit (HOSPITAL_BASED_OUTPATIENT_CLINIC_OR_DEPARTMENT_OTHER): Payer: Medicare Other

## 2016-12-15 ENCOUNTER — Other Ambulatory Visit (HOSPITAL_BASED_OUTPATIENT_CLINIC_OR_DEPARTMENT_OTHER): Payer: Medicare Other

## 2016-12-15 VITALS — BP 120/53 | HR 66 | Temp 98.6°F | Resp 18

## 2016-12-15 DIAGNOSIS — C9110 Chronic lymphocytic leukemia of B-cell type not having achieved remission: Secondary | ICD-10-CM

## 2016-12-15 DIAGNOSIS — Z5112 Encounter for antineoplastic immunotherapy: Secondary | ICD-10-CM

## 2016-12-15 DIAGNOSIS — D801 Nonfamilial hypogammaglobulinemia: Secondary | ICD-10-CM

## 2016-12-15 LAB — CBC WITH DIFFERENTIAL/PLATELET
BASO%: 1.1 % (ref 0.0–2.0)
Basophils Absolute: 0.2 10*3/uL — ABNORMAL HIGH (ref 0.0–0.1)
EOS ABS: 0.1 10*3/uL (ref 0.0–0.5)
EOS%: 1 % (ref 0.0–7.0)
HCT: 36.5 % (ref 34.8–46.6)
HGB: 12.3 g/dL (ref 11.6–15.9)
LYMPH%: 73.6 % — ABNORMAL HIGH (ref 14.0–49.7)
MCH: 30.6 pg (ref 25.1–34.0)
MCHC: 33.8 g/dL (ref 31.5–36.0)
MCV: 90.4 fL (ref 79.5–101.0)
MONO#: 0.7 10*3/uL (ref 0.1–0.9)
MONO%: 4.9 % (ref 0.0–14.0)
NEUT%: 19.4 % — ABNORMAL LOW (ref 38.4–76.8)
NEUTROS ABS: 2.7 10*3/uL (ref 1.5–6.5)
Platelets: 113 10*3/uL — ABNORMAL LOW (ref 145–400)
RBC: 4.04 10*6/uL (ref 3.70–5.45)
RDW: 13.7 % (ref 11.2–14.5)
WBC: 14.1 10*3/uL — AB (ref 3.9–10.3)
lymph#: 10.4 10*3/uL — ABNORMAL HIGH (ref 0.9–3.3)

## 2016-12-15 LAB — LACTATE DEHYDROGENASE: LDH: 207 U/L (ref 125–245)

## 2016-12-15 LAB — URIC ACID: Uric Acid, Serum: 5.6 mg/dl (ref 2.6–7.4)

## 2016-12-15 MED ORDER — DIPHENHYDRAMINE HCL 25 MG PO CAPS
ORAL_CAPSULE | ORAL | Status: AC
  Filled 2016-12-15: qty 2

## 2016-12-15 MED ORDER — SODIUM CHLORIDE 0.9 % IV SOLN
500.0000 mg/m2 | Freq: Once | INTRAVENOUS | Status: AC
  Administered 2016-12-15: 800 mg via INTRAVENOUS
  Filled 2016-12-15: qty 50

## 2016-12-15 MED ORDER — HEPARIN SOD (PORK) LOCK FLUSH 100 UNIT/ML IV SOLN
500.0000 [IU] | Freq: Once | INTRAVENOUS | Status: AC | PRN
  Administered 2016-12-15: 500 [IU]
  Filled 2016-12-15: qty 5

## 2016-12-15 MED ORDER — ACETAMINOPHEN 325 MG PO TABS
650.0000 mg | ORAL_TABLET | Freq: Once | ORAL | Status: AC
  Administered 2016-12-15: 650 mg via ORAL

## 2016-12-15 MED ORDER — SODIUM CHLORIDE 0.9 % IV SOLN
Freq: Once | INTRAVENOUS | Status: AC
  Administered 2016-12-15: 10:00:00 via INTRAVENOUS

## 2016-12-15 MED ORDER — DIPHENHYDRAMINE HCL 25 MG PO CAPS
50.0000 mg | ORAL_CAPSULE | Freq: Once | ORAL | Status: AC
  Administered 2016-12-15: 50 mg via ORAL

## 2016-12-15 MED ORDER — SODIUM CHLORIDE 0.9% FLUSH
10.0000 mL | Freq: Once | INTRAVENOUS | Status: AC
  Administered 2016-12-15: 10 mL
  Filled 2016-12-15: qty 10

## 2016-12-15 MED ORDER — SODIUM CHLORIDE 0.9% FLUSH
10.0000 mL | INTRAVENOUS | Status: DC | PRN
  Administered 2016-12-15: 10 mL
  Filled 2016-12-15: qty 10

## 2016-12-15 MED ORDER — ACETAMINOPHEN 325 MG PO TABS
ORAL_TABLET | ORAL | Status: AC
  Filled 2016-12-15: qty 2

## 2016-12-15 NOTE — Patient Instructions (Signed)
[facility] Discharge Instructions for Patients Receiving Chemotherapy  Today you received the following chemotherapy agents:  Rituximab   To help prevent nausea and vomiting after your treatment, we encourage you to take your nausea medication as prescribed.  If you develop nausea and vomiting that is not controlled by your nausea medication, call the clinic.   BELOW ARE SYMPTOMS THAT SHOULD BE REPORTED IMMEDIATELY:  *FEVER GREATER THAN 100.5 F  *CHILLS WITH OR WITHOUT FEVER  NAUSEA AND VOMITING THAT IS NOT CONTROLLED WITH YOUR NAUSEA MEDICATION  *UNUSUAL SHORTNESS OF BREATH  *UNUSUAL BRUISING OR BLEEDING  TENDERNESS IN MOUTH AND THROAT WITH OR WITHOUT PRESENCE OF ULCERS  *URINARY PROBLEMS  *BOWEL PROBLEMS  UNUSUAL RASH Items with * indicate a potential emergency and should be followed up as soon as possible.  Feel free to call the clinic you have any questions or concerns. The clinic phone number is [phone].  Please show the [facility] at check-in to the Emergency Department and triage nurse.  Rituximab injection What is this medicine? RITUXIMAB (ri TUX i mab) is a monoclonal antibody. It is used to treat non-Hodgkin lymphoma and chronic lymphocytic leukemia. It is also used to treat rheumatoid arthritis (RA). In RA, this medicine slows the inflammatory process and help reduce joint pain and swelling. This medicine is often used with other cancer or arthritis medications. This medicine may be used for other purposes; ask your health care provider or pharmacist if you have questions. COMMON BRAND NAME(S): Rituxan What should I tell my health care provider before I take this medicine? They need to know if you have any of these conditions: -heart disease -infection (especially a virus infection such as hepatitis B, chickenpox, cold sores, or herpes) -immune system problems -irregular heartbeat -kidney disease -lung or breathing disease, like  asthma -recently received or scheduled to receive a vaccine -an unusual or allergic reaction to rituximab, mouse proteins, other medicines, foods, dyes, or preservatives -pregnant or trying to get pregnant -breast-feeding How should I use this medicine? This medicine is for infusion into a vein. It is administered in a hospital or clinic by a specially trained health care professional. A special MedGuide will be given to you by the pharmacist with each prescription and refill. Be sure to read this information carefully each time. Talk to your pediatrician regarding the use of this medicine in children. This medicine is not approved for use in children. Overdosage: If you think you have taken too much of this medicine contact a poison control center or emergency room at once. NOTE: This medicine is only for you. Do not share this medicine with others. What if I miss a dose? It is important not to miss a dose. Call your doctor or health care professional if you are unable to keep an appointment. What may interact with this medicine? -cisplatin -medicines for blood pressure -some other medicines for arthritis -vaccines This list may not describe all possible interactions. Give your health care provider a list of all the medicines, herbs, non-prescription drugs, or dietary supplements you use. Also tell them if you smoke, drink alcohol, or use illegal drugs. Some items may interact with your medicine. What should I watch for while using this medicine? Report any side effects that you notice during your treatment right away, such as changes in your breathing, fever, chills, dizziness or lightheadedness. These effects are more common with the first dose. Visit your prescriber or health care professional for checks on your  progress. You will need to have regular blood work. Report any other side effects. The side effects of this medicine can continue after you finish your treatment. Continue your  course of treatment even though you feel ill unless your doctor tells you to stop. Call your doctor or health care professional for advice if you get a fever, chills or sore throat, or other symptoms of a cold or flu. Do not treat yourself. This drug decreases your body's ability to fight infections. Try to avoid being around people who are sick. This medicine may increase your risk to bruise or bleed. Call your doctor or health care professional if you notice any unusual bleeding. Be careful brushing and flossing your teeth or using a toothpick because you may get an infection or bleed more easily. If you have any dental work done, tell your dentist you are receiving this medicine. Avoid taking products that contain aspirin, acetaminophen, ibuprofen, naproxen, or ketoprofen unless instructed by your doctor. These medicines may hide a fever. Do not become pregnant while taking this medicine. Women should inform their doctor if they wish to become pregnant or think they might be pregnant. There is a potential for serious side effects to an unborn child. Talk to your health care professional or pharmacist for more information. Do not breast-feed an infant while taking this medicine. What side effects may I notice from receiving this medicine? Side effects that you should report to your doctor or health care professional as soon as possible: -allergic reactions like skin rash, itching or hives, swelling of the face, lips, or tongue -low blood counts - this medicine may decrease the number of white blood cells, red blood cells and platelets. You may be at increased risk for infections and bleeding. -signs of infection - fever or chills, cough, sore throat, pain or difficulty passing urine -signs of decreased platelets or bleeding - bruising, pinpoint red spots on the skin, black, tarry stools, blood in the urine -signs of decreased red blood cells - unusually weak or tired, fainting spells,  lightheadedness -breathing problems -confused, not responsive -chest pain -fast, irregular heartbeat -feeling faint or lightheaded, falls -mouth sores -redness, blistering, peeling or loosening of the skin, including inside the mouth -stomach pain -swelling of the ankles, feet, or hands -trouble passing urine or change in the amount of urine Side effects that usually do not require medical attention (report to your doctor or health care professional if they continue or are bothersome): -anxiety -headache -loss of appetite -muscle aches -nausea -night sweats This list may not describe all possible side effects. Call your doctor for medical advice about side effects. You may report side effects to FDA at 1-800-FDA-1088. Where should I keep my medicine? This drug is given in a hospital or clinic and will not be stored at home. NOTE: This sheet is a summary. It may not cover all possible information. If you have questions about this medicine, talk to your doctor, pharmacist, or health care provider.  2017 Elsevier/Gold Standard (2016-05-01 17:23:26) -i-

## 2016-12-15 NOTE — Progress Notes (Signed)
Patient's spouse came in inquiring about grant through [place] and wanting to see if this was being used for the medication from [employer] as well.  I contacted [employer] with him present and spoke with [place] whom informed me that there was just one grant and [employer] used this grant as well. Patient's balance is $491.86. I gave the phone to patient's spouse for [place] to explain to him what happened.

## 2016-12-23 ENCOUNTER — Telehealth: Payer: Self-pay | Admitting: Pharmacist

## 2016-12-23 NOTE — Telephone Encounter (Signed)
Oral Chemotherapy Pharmacist Encounter  Received call from [place] letting the office know that patient has exhausted copayment funds for [facility] and needs clinic support completing application for manufacturer assistance.  I called and spoke with husband, Mariea Clonts, to inquire about need for assistance and to obtain permission to start application process. Permission granted. Patient and husband will come up to clinic 2/21 pm or 2/22 am to bring income documentation and to sign PAP application.  I called [employer] at [phone] to inquire about assistance process and was given several pieces of information to help application process go as smoothly as possible:  We need to notate on application that patient has exhausted funds from [employer]  We will need to include a list of monthly expenses for the patient with the application or they will just ask the patient for it later  We will need to included a letter of hardship from the patient stating the patient cannot afford their copayment and why  I was informed the process usually takes about 2 week if everything goes smoothly.  Oral Oncology Clinic will continue to follow.  Johny Drilling, PharmD, BCPS, BCOP 12/23/2016  1:58 PM Oral Oncology Clinic [phone]

## 2016-12-25 NOTE — Telephone Encounter (Signed)
Oral Chemotherapy Pharmacist Encounter  I met patient and husband in [facility] lobby to complete [employer] PAP application. Completed application along with income documentation, a list of monthly expenses, and a letter of hardship were faxed to [place] ([place]) at [phone].  This encounter will continue to be updated until final determination.  Oral Oncology Clinic will continue to follow.   Johny Drilling, PharmD, BCPS, BCOP 12/25/2016  11:00 AM Oral Oncology Clinic [phone]

## 2016-12-29 ENCOUNTER — Encounter: Payer: Self-pay | Admitting: Hematology and Oncology

## 2016-12-29 ENCOUNTER — Ambulatory Visit (HOSPITAL_BASED_OUTPATIENT_CLINIC_OR_DEPARTMENT_OTHER): Payer: Medicare Other

## 2016-12-29 ENCOUNTER — Other Ambulatory Visit (HOSPITAL_BASED_OUTPATIENT_CLINIC_OR_DEPARTMENT_OTHER): Payer: Medicare Other

## 2016-12-29 ENCOUNTER — Telehealth: Payer: Self-pay | Admitting: Hematology and Oncology

## 2016-12-29 ENCOUNTER — Ambulatory Visit (HOSPITAL_BASED_OUTPATIENT_CLINIC_OR_DEPARTMENT_OTHER): Payer: Medicare Other | Admitting: Hematology and Oncology

## 2016-12-29 ENCOUNTER — Ambulatory Visit: Payer: Medicare Other

## 2016-12-29 VITALS — BP 152/64 | HR 94 | Temp 97.6°F | Resp 18 | Wt 115.2 lb

## 2016-12-29 VITALS — BP 123/54 | HR 86 | Temp 97.0°F | Resp 18

## 2016-12-29 DIAGNOSIS — C9110 Chronic lymphocytic leukemia of B-cell type not having achieved remission: Secondary | ICD-10-CM

## 2016-12-29 DIAGNOSIS — D801 Nonfamilial hypogammaglobulinemia: Secondary | ICD-10-CM

## 2016-12-29 DIAGNOSIS — Z5112 Encounter for antineoplastic immunotherapy: Secondary | ICD-10-CM

## 2016-12-29 DIAGNOSIS — J0141 Acute recurrent pansinusitis: Secondary | ICD-10-CM

## 2016-12-29 DIAGNOSIS — D696 Thrombocytopenia, unspecified: Secondary | ICD-10-CM | POA: Diagnosis not present

## 2016-12-29 LAB — CBC WITH DIFFERENTIAL/PLATELET
BASO%: 1.4 % (ref 0.0–2.0)
BASOS ABS: 0.1 10*3/uL (ref 0.0–0.1)
EOS ABS: 0.2 10*3/uL (ref 0.0–0.5)
EOS%: 1.6 % (ref 0.0–7.0)
HEMATOCRIT: 36.1 % (ref 34.8–46.6)
HEMOGLOBIN: 12.3 g/dL (ref 11.6–15.9)
LYMPH#: 7 10*3/uL — AB (ref 0.9–3.3)
LYMPH%: 73 % — ABNORMAL HIGH (ref 14.0–49.7)
MCH: 30.4 pg (ref 25.1–34.0)
MCHC: 34 g/dL (ref 31.5–36.0)
MCV: 89.2 fL (ref 79.5–101.0)
MONO#: 0.7 10*3/uL (ref 0.1–0.9)
MONO%: 7.2 % (ref 0.0–14.0)
NEUT#: 1.6 10*3/uL (ref 1.5–6.5)
NEUT%: 16.8 % — AB (ref 38.4–76.8)
PLATELETS: 97 10*3/uL — AB (ref 145–400)
RBC: 4.05 10*6/uL (ref 3.70–5.45)
RDW: 13.9 % (ref 11.2–14.5)
WBC: 9.5 10*3/uL (ref 3.9–10.3)

## 2016-12-29 LAB — LACTATE DEHYDROGENASE: LDH: 234 U/L (ref 125–245)

## 2016-12-29 LAB — TECHNOLOGIST REVIEW

## 2016-12-29 MED ORDER — SODIUM CHLORIDE 0.9% FLUSH
10.0000 mL | INTRAVENOUS | Status: DC | PRN
  Administered 2016-12-29: 10 mL
  Filled 2016-12-29: qty 10

## 2016-12-29 MED ORDER — ACETAMINOPHEN 325 MG PO TABS
ORAL_TABLET | ORAL | Status: AC
  Filled 2016-12-29: qty 2

## 2016-12-29 MED ORDER — HEPARIN SOD (PORK) LOCK FLUSH 100 UNIT/ML IV SOLN
500.0000 [IU] | Freq: Once | INTRAVENOUS | Status: AC | PRN
  Administered 2016-12-29: 500 [IU]
  Filled 2016-12-29: qty 5

## 2016-12-29 MED ORDER — SODIUM CHLORIDE 0.9% FLUSH
10.0000 mL | Freq: Once | INTRAVENOUS | Status: AC
  Administered 2016-12-29: 10 mL
  Filled 2016-12-29: qty 10

## 2016-12-29 MED ORDER — DIPHENHYDRAMINE HCL 25 MG PO CAPS
ORAL_CAPSULE | ORAL | Status: AC
  Filled 2016-12-29: qty 2

## 2016-12-29 MED ORDER — SODIUM CHLORIDE 0.9 % IV SOLN
500.0000 mg/m2 | Freq: Once | INTRAVENOUS | Status: AC
  Administered 2016-12-29: 800 mg via INTRAVENOUS
  Filled 2016-12-29: qty 50

## 2016-12-29 MED ORDER — ACETAMINOPHEN 500 MG PO TABS
ORAL_TABLET | ORAL | Status: AC
Start: 2016-12-29 — End: 2016-12-29
  Filled 2016-12-29: qty 2

## 2016-12-29 MED ORDER — SODIUM CHLORIDE 0.9 % IV SOLN
Freq: Once | INTRAVENOUS | Status: AC
  Administered 2016-12-29: 10:00:00 via INTRAVENOUS

## 2016-12-29 MED ORDER — DIPHENHYDRAMINE HCL 25 MG PO CAPS
50.0000 mg | ORAL_CAPSULE | Freq: Once | ORAL | Status: AC
  Administered 2016-12-29: 50 mg via ORAL

## 2016-12-29 MED ORDER — ACETAMINOPHEN 325 MG PO TABS
650.0000 mg | ORAL_TABLET | Freq: Once | ORAL | Status: AC
  Administered 2016-12-29: 650 mg via ORAL

## 2016-12-29 NOTE — Assessment & Plan Note (Signed)
She tolerated rituximab treatment well. She denies reaction to Rituxan. She has felt that most of the lymphadenopathy has regressed in size. Her oral chemotherapy has been approved and hopefully will be delivered soon. However, she is facing major financial problems of paying for her medications I recommend we continue treatment as prescribed. If she cannot get her medication refill due to inability to pay for her medications, we will decide based on repeat imaging study.  If PET CT scan showed complete response to treatment, we will just give her rituximab alone.  However, if she has significant residual disease, I will consider changing her treatment by substituting with systemic chemotherapy.

## 2016-12-29 NOTE — Progress Notes (Signed)
[facility] OFFICE PROGRESS NOTE  Patient Care Team: Shon Baton, MD as PCP - General (Internal Medicine) Heath Lark, MD as Consulting Physician (Hematology and Oncology) Eustace Quail Tat, DO as Consulting Physician (Neurology)  SUMMARY OF ONCOLOGIC HISTORY: Oncology History   CLL stage IV, deletion 13q     CLL (chronic lymphocytic leukemia) ([place])   12/12/2013 Bone Marrow Biopsy    Bone marrow biopsy show significant involvement of CLL      12/13/2013 Procedure    Patient had placement of a port      12/14/2013 Imaging    CT scan of the chest, abdomen and pelvis showed significant lymphadenopathy and splenomegaly      12/20/2013 - 12/22/2013 Hospital Admission    The patient was admitted to the hospital to begin cycle 1 day 1 of chemotherapy due to risk of infusion reaction. She tolerated treatment well without major side effects.      12/20/2013 - 03/13/2014 Chemotherapy    started on cycle 1 of Obinutuzumab, chlorambucil and prednisone. All treatment placed on hold on 03/13/2014 due to persistent pancytopenia.      03/28/2014 Imaging    Repeat imaging study showed excellent response to treatment.      10/25/2014 Imaging    She has persistent mild lymphadenopathy and splenomegaly, improved compared to previous exam      04/23/2015 Imaging    Ct scan showed disease progression      05/09/2015 - 11/26/2015 Chemotherapy    She started taking ibrutinib.      11/26/2015 Adverse Reaction    Treatment  was placed on hold due to neutropenia and recent infection      02/11/2016 - 10/10/2016 Chemotherapy    She is started on monthly IVIG Rx for recurrent infection and acquired panhypogammaglobulinemia      11/17/2016 -  Chemotherapy    The patient is started on Rituximab. Idelisib was started on 2/1       INTERVAL HISTORY: Please see below for problem oriented charting. She returns with family members. She is concerned about paying for her medications.  She is  applying for assistance to help pay for her prescription medicine. Overall, she feels well.  She did develop some congestion and cough and is currently on day 4 out of 5 of oral antibiotics with levofloxacin that was prescribed by her primary care doctor's office. The patient denies any recent signs or symptoms of bleeding such as spontaneous epistaxis, hematuria or hematochezia. She had recent diarrhea, resolved. She denies new lymphadenopathy.  REVIEW OF SYSTEMS:   Constitutional: Denies fevers, chills or abnormal weight loss Eyes: Denies blurriness of vision Ears, nose, mouth, throat, and face: Denies mucositis or sore throat Cardiovascular: Denies palpitation, chest discomfort or lower extremity swelling Skin: Denies abnormal skin rashes Lymphatics: Denies new lymphadenopathy  Neurological:Denies numbness, tingling or new weaknesses Behavioral/Psych: Mood is stable, no new changes  All other systems were reviewed with the patient and are negative.  I have reviewed the past medical history, past surgical history, social history and family history with the patient and they are unchanged from previous note.  ALLERGIES:  is allergic to bactrim [sulfamethoxazole w/trimethoprim (co-trimoxazole)].  MEDICATIONS:  Current Outpatient Prescriptions  Medication Sig Dispense Refill  . acyclovir (ZOVIRAX) 400 MG tablet Take 1 tablet (400 mg total) by mouth daily. 90 tablet 3  . atorvastatin (LIPITOR) 20 MG tablet Take 20 mg by mouth every evening.     . calcium citrate-vitamin D (CITRACAL+D) 315-200 MG-UNIT  per tablet Take 1 tablet by mouth daily.     . Cholecalciferol (VITAMIN D) 400 UNITS capsule Take 1,000 Units by mouth at bedtime.     . diphenhydrAMINE (BENADRYL) 25 mg capsule Take 50 mg by mouth at bedtime as needed for allergies (allergies).     Adora Fridge VAGINAL 0.1 MG/GM vaginal cream Place 1 Applicatorful vaginally 2 (two) times a week.     . folic acid (FOLVITE) 734 MCG tablet Take 400  mcg by mouth daily.    Marland Kitchen glucosamine-chondroitin 500-400 MG tablet Take 1 tablet by mouth daily.      . idelalisib (ZYDELIG) 150 MG tablet Take 1 tablet (150 mg total) by mouth 2 (two) times daily. 60 tablet 11  . levETIRAcetam (KEPPRA XR) 500 MG 24 hr tablet TAKE 2 TABLETS BY MOUTH  DAILY 180 tablet 1  . lidocaine-prilocaine (EMLA) cream Apply 1 application topically as needed. 30 g 6  . loratadine (CLARITIN) 10 MG tablet Take 10 mg by mouth daily.      . montelukast (SINGULAIR) 10 MG tablet Take 10 mg by mouth daily.     . Multiple Vitamin (MULTIVITAMIN) tablet Take 1 tablet by mouth daily.      . ondansetron (ZOFRAN) 8 MG tablet Take 1 tablet (8 mg total) by mouth every 8 (eight) hours as needed for nausea. 30 tablet 3  . polyvinyl alcohol (ARTIFICIAL TEARS) 1.4 % ophthalmic solution Place 1 drop into both eyes 3 (three) times daily as needed for dry eyes (dry eyes). Reported on 03/10/2016    . sodium chloride (OCEAN) 0.65 % SOLN nasal spray Place 1 spray into both nostrils 2 (two) times daily.      No current facility-administered medications for this visit.    Facility-Administered Medications Ordered in Other Visits  Medication Dose Route Frequency Provider Last Rate Last Dose  . heparin lock flush 100 unit/mL  500 Units Intracatheter Once PRN Heath Lark, MD      . sodium chloride flush (NS) 0.9 % injection 10 mL  10 mL Intracatheter PRN Heath Lark, MD        PHYSICAL EXAMINATION: ECOG PERFORMANCE STATUS: 1 - Symptomatic but completely ambulatory  Vitals:   12/29/16 0922  BP: (!) 152/64  Pulse: 94  Resp: 18  Temp: 97.6 F (36.4 C)   Filed Weights   12/29/16 0922  Weight: 115 lb 3.2 oz (52.3 kg)    GENERAL:alert, no distress and comfortable SKIN: skin color, texture, turgor are normal, no rashes or significant lesions.  Noted skin bruises.   EYES: normal, Conjunctiva are pink and non-injected, sclera clear OROPHARYNX:no exudate, no erythema and lips, buccal mucosa, and  tongue normal  NECK: supple, thyroid normal size, non-tender, without nodularity LYMPH:  no palpable lymphadenopathy in the cervical, axillary or inguinal. Previously palpable lymphadenopathy has completely resolved LUNGS: clear to auscultation and percussion with normal breathing effort HEART: regular rate & rhythm and no murmurs and no lower extremity edema ABDOMEN:abdomen soft, non-tender and normal bowel sounds Musculoskeletal:no cyanosis of digits and no clubbing  NEURO: alert & oriented x 3 with fluent speech, no focal motor/sensory deficits  LABORATORY DATA:  I have reviewed the data as listed    Component Value Date/Time   NA 137 12/01/2016 0801   K 3.8 12/01/2016 0801   CL 109 05/13/2016 1238   CL 99 04/08/2013 0922   CO2 22 12/01/2016 0801   GLUCOSE 94 12/01/2016 0801   GLUCOSE 92 04/08/2013 0922   BUN 15.2  12/01/2016 0801   CREATININE 0.9 12/01/2016 0801   CALCIUM 9.8 12/01/2016 0801   PROT 6.6 12/01/2016 0801   ALBUMIN 3.9 12/01/2016 0801   AST 22 12/01/2016 0801   ALT 14 12/01/2016 0801   ALKPHOS 53 12/01/2016 0801   BILITOT 0.65 12/01/2016 0801   GFRNONAA 53 (L) 05/13/2016 1238   GFRAA >60 05/13/2016 1238    No results found for: SPEP, UPEP  Lab Results  Component Value Date   WBC 9.5 12/29/2016   NEUTROABS 1.6 12/29/2016   HGB 12.3 12/29/2016   HCT 36.1 12/29/2016   MCV 89.2 12/29/2016   PLT 97 (L) 12/29/2016      Chemistry      Component Value Date/Time   NA 137 12/01/2016 0801   K 3.8 12/01/2016 0801   CL 109 05/13/2016 1238   CL 99 04/08/2013 0922   CO2 22 12/01/2016 0801   BUN 15.2 12/01/2016 0801   CREATININE 0.9 12/01/2016 0801      Component Value Date/Time   CALCIUM 9.8 12/01/2016 0801   ALKPHOS 53 12/01/2016 0801   AST 22 12/01/2016 0801   ALT 14 12/01/2016 0801   BILITOT 0.65 12/01/2016 0801       ASSESSMENT & PLAN:  CLL (chronic lymphocytic leukemia) She tolerated rituximab treatment well. She denies reaction to  Rituxan. She has felt that most of the lymphadenopathy has regressed in size. Her oral chemotherapy has been approved and hopefully will be delivered soon. However, she is facing major financial problems of paying for her medications I recommend we continue treatment as prescribed. If she cannot get her medication refill due to inability to pay for her medications, we will decide based on repeat imaging study.  If PET CT scan showed complete response to treatment, we will just give her rituximab alone.  However, if she has significant residual disease, I will consider changing her treatment by substituting with systemic chemotherapy.  Thrombocytopenia ([place]) She is not symptomatic apart from minor bruising. I will observe for now. This is likely due to disease and recent treatment. I will continue the chemotherapy at current dose without dosage adjustment.  If the thrombocytopenia gets progressive worse in the future, I might have to delay her treatment or adjust the chemotherapy dose.  Acute recurrent sinusitis She has another episode of recurrent sinusitis. She is completing a course of levofloxacin as prescribed by her primary care doctor.   Orders Placed This Encounter  Procedures  . NM PET Image Restag (PS) Skull Base To Thigh    Standing Status:   Future    Standing Expiration Date:   02/02/2018    Order Specific Question:   Reason for exam:    Answer:   staging lymphoma, assess response to Rx    Order Specific Question:   Preferred imaging location?    Answer:   [facility]   All questions were answered. The patient knows to call the clinic with any problems, questions or concerns. No barriers to learning was detected. I spent 25 minutes counseling the patient face to face. The total time spent in the appointment was 30 minutes and more than 50% was on counseling and review of test results     Heath Lark, MD 12/29/2016 12:43 PM

## 2016-12-29 NOTE — Patient Instructions (Signed)
[facility] Discharge Instructions for Patients Receiving Chemotherapy  Today you received the following chemotherapy agents:  Rituximab   To help prevent nausea and vomiting after your treatment, we encourage you to take your nausea medication as prescribed.  If you develop nausea and vomiting that is not controlled by your nausea medication, call the clinic.   BELOW ARE SYMPTOMS THAT SHOULD BE REPORTED IMMEDIATELY:  *FEVER GREATER THAN 100.5 F  *CHILLS WITH OR WITHOUT FEVER  NAUSEA AND VOMITING THAT IS NOT CONTROLLED WITH YOUR NAUSEA MEDICATION  *UNUSUAL SHORTNESS OF BREATH  *UNUSUAL BRUISING OR BLEEDING  TENDERNESS IN MOUTH AND THROAT WITH OR WITHOUT PRESENCE OF ULCERS  *URINARY PROBLEMS  *BOWEL PROBLEMS  UNUSUAL RASH Items with * indicate a potential emergency and should be followed up as soon as possible.  Feel free to call the clinic you have any questions or concerns. The clinic phone number is [phone].  Please show the [facility] at check-in to the Emergency Department and triage nurse.  Rituximab injection What is this medicine? RITUXIMAB (ri TUX i mab) is a monoclonal antibody. It is used to treat non-Hodgkin lymphoma and chronic lymphocytic leukemia. It is also used to treat rheumatoid arthritis (RA). In RA, this medicine slows the inflammatory process and help reduce joint pain and swelling. This medicine is often used with other cancer or arthritis medications. This medicine may be used for other purposes; ask your health care provider or pharmacist if you have questions. COMMON BRAND NAME(S): Rituxan What should I tell my health care provider before I take this medicine? They need to know if you have any of these conditions: -heart disease -infection (especially a virus infection such as hepatitis B, chickenpox, cold sores, or herpes) -immune system problems -irregular heartbeat -kidney disease -lung or breathing disease, like  asthma -recently received or scheduled to receive a vaccine -an unusual or allergic reaction to rituximab, mouse proteins, other medicines, foods, dyes, or preservatives -pregnant or trying to get pregnant -breast-feeding How should I use this medicine? This medicine is for infusion into a vein. It is administered in a hospital or clinic by a specially trained health care professional. A special MedGuide will be given to you by the pharmacist with each prescription and refill. Be sure to read this information carefully each time. Talk to your pediatrician regarding the use of this medicine in children. This medicine is not approved for use in children. Overdosage: If you think you have taken too much of this medicine contact a poison control center or emergency room at once. NOTE: This medicine is only for you. Do not share this medicine with others. What if I miss a dose? It is important not to miss a dose. Call your doctor or health care professional if you are unable to keep an appointment. What may interact with this medicine? -cisplatin -medicines for blood pressure -some other medicines for arthritis -vaccines This list may not describe all possible interactions. Give your health care provider a list of all the medicines, herbs, non-prescription drugs, or dietary supplements you use. Also tell them if you smoke, drink alcohol, or use illegal drugs. Some items may interact with your medicine. What should I watch for while using this medicine? Report any side effects that you notice during your treatment right away, such as changes in your breathing, fever, chills, dizziness or lightheadedness. These effects are more common with the first dose. Visit your prescriber or health care professional for checks on your  progress. You will need to have regular blood work. Report any other side effects. The side effects of this medicine can continue after you finish your treatment. Continue your  course of treatment even though you feel ill unless your doctor tells you to stop. Call your doctor or health care professional for advice if you get a fever, chills or sore throat, or other symptoms of a cold or flu. Do not treat yourself. This drug decreases your body's ability to fight infections. Try to avoid being around people who are sick. This medicine may increase your risk to bruise or bleed. Call your doctor or health care professional if you notice any unusual bleeding. Be careful brushing and flossing your teeth or using a toothpick because you may get an infection or bleed more easily. If you have any dental work done, tell your dentist you are receiving this medicine. Avoid taking products that contain aspirin, acetaminophen, ibuprofen, naproxen, or ketoprofen unless instructed by your doctor. These medicines may hide a fever. Do not become pregnant while taking this medicine. Women should inform their doctor if they wish to become pregnant or think they might be pregnant. There is a potential for serious side effects to an unborn child. Talk to your health care professional or pharmacist for more information. Do not breast-feed an infant while taking this medicine. What side effects may I notice from receiving this medicine? Side effects that you should report to your doctor or health care professional as soon as possible: -allergic reactions like skin rash, itching or hives, swelling of the face, lips, or tongue -low blood counts - this medicine may decrease the number of white blood cells, red blood cells and platelets. You may be at increased risk for infections and bleeding. -signs of infection - fever or chills, cough, sore throat, pain or difficulty passing urine -signs of decreased platelets or bleeding - bruising, pinpoint red spots on the skin, black, tarry stools, blood in the urine -signs of decreased red blood cells - unusually weak or tired, fainting spells,  lightheadedness -breathing problems -confused, not responsive -chest pain -fast, irregular heartbeat -feeling faint or lightheaded, falls -mouth sores -redness, blistering, peeling or loosening of the skin, including inside the mouth -stomach pain -swelling of the ankles, feet, or hands -trouble passing urine or change in the amount of urine Side effects that usually do not require medical attention (report to your doctor or health care professional if they continue or are bothersome): -anxiety -headache -loss of appetite -muscle aches -nausea -night sweats This list may not describe all possible side effects. Call your doctor for medical advice about side effects. You may report side effects to FDA at 1-800-FDA-1088. Where should I keep my medicine? This drug is given in a hospital or clinic and will not be stored at home. NOTE: This sheet is a summary. It may not cover all possible information. If you have questions about this medicine, talk to your doctor, pharmacist, or health care provider.  2017 Elsevier/Gold Standard (2016-05-01 17:23:26) -i-

## 2016-12-29 NOTE — Assessment & Plan Note (Signed)
She is not symptomatic apart from minor bruising. I will observe for now. This is likely due to disease and recent treatment. I will continue the chemotherapy at current dose without dosage adjustment.  If the thrombocytopenia gets progressive worse in the future, I might have to delay her treatment or adjust the chemotherapy dose.

## 2016-12-29 NOTE — Telephone Encounter (Signed)
APPOINTMENTS COMPLETE PER 2/26 LOS. PATIENT TO GET PRINT OUT IN INFUSION AREA. CENTRAL RADIOLOGY SCHEDULING WILL CALL RE SCAN.

## 2016-12-29 NOTE — Progress Notes (Signed)
Notified Tammi RN of platelets 97 today. She will notify Dr Alvy Bimler.

## 2016-12-29 NOTE — Telephone Encounter (Signed)
Oral Chemotherapy Pharmacist Encounter  Received notification from [employer] that patient has been DENIED for enrollment into their program to receive medication from the manufacturer. Per the company patient is over income. I called [employer] at [phone] to inquire about determination. They will not disclose income cut-off and there is no way to appeal decision. We can re-apply for patient if there is a significant change in income status.  I met patient, husband, and daughter in infusion room. The manufacturer had reached out to them this past Friday (12/26/16) with the determination.  Oral Oncology Clinic will continue to look for alternate foundation assistance for CLL. No foundation grants available at this time.  Angelica Sullivan expressed appreciation and understanding. She states she will call pharmacy for next Zydelig fill and plan to continue on the medication at least for the short term.  They know to call the office with questions or concerns.  Johny Drilling, PharmD, BCPS, BCOP 12/29/2016  10:43 AM Oral Oncology Clinic [phone]

## 2016-12-29 NOTE — Assessment & Plan Note (Signed)
She has another episode of recurrent sinusitis. She is completing a course of levofloxacin as prescribed by her primary care doctor.

## 2016-12-31 ENCOUNTER — Telehealth: Payer: Self-pay | Admitting: Pharmacist

## 2016-12-31 NOTE — Telephone Encounter (Signed)
Oral Chemotherapy Pharmacist Encounter  Noted on 12/30/16 that Patient [facility]ica [facility]) had open copayment funds for CLL so I called [employer] at [phone] and requested their patient assistance team apply for these funds on behalf of the patient.  Received notification today (12/31/16) that patient had been successfully enrolled for copayment funds from [employer] amount: $[phone] Effective dates: 12/30/16 - 12/29/17 ID: [id number] Group: [id number]  I will place a copy of the award letter to be scanned into patient's chart.  Johny Drilling, PharmD, BCPS, BCOP 12/31/2016 11:22 AM Oral Oncology Clinic [phone]

## 2017-01-06 ENCOUNTER — Encounter (HOSPITAL_COMMUNITY)
Admission: RE | Admit: 2017-01-06 | Discharge: 2017-01-06 | Disposition: A | Discharge status: Home / Self Care | Payer: Medicare Other | Source: Ambulatory Visit | Attending: Hematology and Oncology | Admitting: Hematology and Oncology

## 2017-01-06 DIAGNOSIS — C9110 Chronic lymphocytic leukemia of B-cell type not having achieved remission: Secondary | ICD-10-CM | POA: Insufficient documentation

## 2017-01-06 LAB — GLUCOSE, CAPILLARY: Glucose-Capillary: 92 mg/dL (ref 65–99)

## 2017-01-06 MED ORDER — FLUDEOXYGLUCOSE F - 18 (FDG) INJECTION
5.6000 | Freq: Once | INTRAVENOUS | Status: AC | PRN
  Administered 2017-01-06: 5.6 via INTRAVENOUS

## 2017-01-12 ENCOUNTER — Encounter: Payer: Self-pay | Admitting: Hematology and Oncology

## 2017-01-12 ENCOUNTER — Other Ambulatory Visit (HOSPITAL_BASED_OUTPATIENT_CLINIC_OR_DEPARTMENT_OTHER): Payer: Medicare Other

## 2017-01-12 ENCOUNTER — Telehealth: Payer: Self-pay | Admitting: Hematology and Oncology

## 2017-01-12 ENCOUNTER — Ambulatory Visit (HOSPITAL_BASED_OUTPATIENT_CLINIC_OR_DEPARTMENT_OTHER): Payer: Medicare Other

## 2017-01-12 ENCOUNTER — Ambulatory Visit: Payer: Medicare Other

## 2017-01-12 ENCOUNTER — Other Ambulatory Visit: Payer: Self-pay | Admitting: Hematology and Oncology

## 2017-01-12 ENCOUNTER — Ambulatory Visit (HOSPITAL_BASED_OUTPATIENT_CLINIC_OR_DEPARTMENT_OTHER): Payer: Medicare Other | Admitting: Hematology and Oncology

## 2017-01-12 VITALS — BP 131/74 | HR 72 | Temp 99.9°F | Resp 16

## 2017-01-12 DIAGNOSIS — C9110 Chronic lymphocytic leukemia of B-cell type not having achieved remission: Secondary | ICD-10-CM

## 2017-01-12 DIAGNOSIS — D801 Nonfamilial hypogammaglobulinemia: Secondary | ICD-10-CM

## 2017-01-12 DIAGNOSIS — D701 Agranulocytosis secondary to cancer chemotherapy: Secondary | ICD-10-CM

## 2017-01-12 DIAGNOSIS — Z5112 Encounter for antineoplastic immunotherapy: Secondary | ICD-10-CM

## 2017-01-12 DIAGNOSIS — D696 Thrombocytopenia, unspecified: Secondary | ICD-10-CM

## 2017-01-12 DIAGNOSIS — I10 Essential (primary) hypertension: Secondary | ICD-10-CM

## 2017-01-12 DIAGNOSIS — T451X5A Adverse effect of antineoplastic and immunosuppressive drugs, initial encounter: Secondary | ICD-10-CM

## 2017-01-12 LAB — CBC WITH DIFFERENTIAL/PLATELET
BASO%: 1.3 % (ref 0.0–2.0)
BASOS ABS: 0.1 10*3/uL (ref 0.0–0.1)
EOS ABS: 0.3 10*3/uL (ref 0.0–0.5)
EOS%: 3.3 % (ref 0.0–7.0)
HEMATOCRIT: 37.5 % (ref 34.8–46.6)
HEMOGLOBIN: 12.5 g/dL (ref 11.6–15.9)
LYMPH#: 8.6 10*3/uL — AB (ref 0.9–3.3)
LYMPH%: 82.4 % — ABNORMAL HIGH (ref 14.0–49.7)
MCH: 29.5 pg (ref 25.1–34.0)
MCHC: 33.2 g/dL (ref 31.5–36.0)
MCV: 88.7 fL (ref 79.5–101.0)
MONO#: 0.9 10*3/uL (ref 0.1–0.9)
MONO%: 8.8 % (ref 0.0–14.0)
NEUT%: 4.2 % — ABNORMAL LOW (ref 38.4–76.8)
NEUTROS ABS: 0.4 10*3/uL — AB (ref 1.5–6.5)
Platelets: 133 10*3/uL — ABNORMAL LOW (ref 145–400)
RBC: 4.23 10*6/uL (ref 3.70–5.45)
RDW: 13.4 % (ref 11.2–14.5)
WBC: 10.5 10*3/uL — AB (ref 3.9–10.3)

## 2017-01-12 LAB — TECHNOLOGIST REVIEW

## 2017-01-12 LAB — LACTATE DEHYDROGENASE: LDH: 174 U/L (ref 125–245)

## 2017-01-12 MED ORDER — HEPARIN SOD (PORK) LOCK FLUSH 100 UNIT/ML IV SOLN
500.0000 [IU] | Freq: Once | INTRAVENOUS | Status: AC | PRN
  Administered 2017-01-12: 500 [IU]
  Filled 2017-01-12: qty 5

## 2017-01-12 MED ORDER — SODIUM CHLORIDE 0.9% FLUSH
10.0000 mL | INTRAVENOUS | Status: DC | PRN
  Administered 2017-01-12: 10 mL
  Filled 2017-01-12: qty 10

## 2017-01-12 MED ORDER — SODIUM CHLORIDE 0.9% FLUSH
10.0000 mL | Freq: Once | INTRAVENOUS | Status: AC
  Administered 2017-01-12: 10 mL
  Filled 2017-01-12: qty 10

## 2017-01-12 MED ORDER — SODIUM CHLORIDE 0.9 % IV SOLN
Freq: Once | INTRAVENOUS | Status: AC
  Administered 2017-01-12: 11:00:00 via INTRAVENOUS

## 2017-01-12 MED ORDER — DIPHENHYDRAMINE HCL 25 MG PO CAPS
ORAL_CAPSULE | ORAL | Status: AC
  Filled 2017-01-12: qty 2

## 2017-01-12 MED ORDER — ACETAMINOPHEN 325 MG PO TABS
650.0000 mg | ORAL_TABLET | Freq: Once | ORAL | Status: AC
  Administered 2017-01-12: 650 mg via ORAL

## 2017-01-12 MED ORDER — ACETAMINOPHEN 325 MG PO TABS
ORAL_TABLET | ORAL | Status: AC
  Filled 2017-01-12: qty 2

## 2017-01-12 MED ORDER — ACYCLOVIR 400 MG PO TABS: 400.0000 mg | ORAL_TABLET | Freq: Every day | ORAL | 3 refills | Status: DC

## 2017-01-12 MED ORDER — SODIUM CHLORIDE 0.9 % IV SOLN
500.0000 mg/m2 | Freq: Once | INTRAVENOUS | Status: AC
  Administered 2017-01-12: 800 mg via INTRAVENOUS
  Filled 2017-01-12: qty 50

## 2017-01-12 MED ORDER — DIPHENHYDRAMINE HCL 25 MG PO CAPS
50.0000 mg | ORAL_CAPSULE | Freq: Once | ORAL | Status: AC
  Administered 2017-01-12: 50 mg via ORAL

## 2017-01-12 NOTE — Assessment & Plan Note (Signed)
This is due to recent treatment. She will hold Idelisib above. We will proceed with rituximab

## 2017-01-12 NOTE — Telephone Encounter (Signed)
Appointments scheduled per 3/12 LOS. Patient given AVS report and calendars with future scheduled appointments. °

## 2017-01-12 NOTE — Progress Notes (Signed)
[name] OFFICE PROGRESS NOTE  Patient Care Team: Shon Baton, MD as PCP - General (Internal Medicine) Heath Lark, MD as Consulting Physician (Hematology and Oncology) Eustace Quail Tat, DO as Consulting Physician (Neurology)  SUMMARY OF ONCOLOGIC HISTORY: Oncology History   CLL stage IV, deletion 13q     CLL (chronic lymphocytic leukemia) ([place])   12/12/2013 Bone Marrow Biopsy    Bone marrow biopsy show significant involvement of CLL      12/13/2013 Procedure    Patient had placement of a port      12/14/2013 Imaging    CT scan of the chest, abdomen and pelvis showed significant lymphadenopathy and splenomegaly      12/20/2013 - 12/22/2013 Hospital Admission    The patient was admitted to the hospital to begin cycle 1 day 1 of chemotherapy due to risk of infusion reaction. She tolerated treatment well without major side effects.      12/20/2013 - 03/13/2014 Chemotherapy    started on cycle 1 of Obinutuzumab, chlorambucil and prednisone. All treatment placed on hold on 03/13/2014 due to persistent pancytopenia.      03/28/2014 Imaging    Repeat imaging study showed excellent response to treatment.      10/25/2014 Imaging    She has persistent mild lymphadenopathy and splenomegaly, improved compared to previous exam      04/23/2015 Imaging    Ct scan showed disease progression      05/09/2015 - 11/26/2015 Chemotherapy    She started taking ibrutinib.      11/26/2015 Adverse Reaction    Treatment  was placed on hold due to neutropenia and recent infection      02/11/2016 - 10/10/2016 Chemotherapy    She is started on monthly IVIG Rx for recurrent infection and acquired panhypogammaglobulinemia      11/17/2016 -  Chemotherapy    The patient is started on Rituximab. Idelisib was started on 2/1      01/06/2017 PET scan    Significant partial treatment response. Persistent mild hypermetabolism within solitary right paratracheal and solitary right hilar lymph nodes,  which both demonstrate decreased metabolism. Otherwise complete metabolic response. Residual mildly enlarged bilateral neck, axillary, retroperitoneal and pelvic lymph nodes have all decreased in size and demonstrate no hypermetabolism. 2. Additional findings include aortic atherosclerosis, three-vessel coronary atherosclerosis and mosaic attenuation in both lungs most commonly due to air trapping from small airways disease      01/12/2017 Adverse Reaction    Idelisib is placed on hold due to neutropenia       INTERVAL HISTORY: Please see below for problem oriented charting. She returns today with her husband to review test results. She feels well. The patient denies any recent signs or symptoms of bleeding such as spontaneous epistaxis, hematuria or hematochezia. No recent infection. No new lymphadenopathy. She had recent diarrhea, resolved  REVIEW OF SYSTEMS:   Constitutional: Denies fevers, chills or abnormal weight loss Eyes: Denies blurriness of vision Ears, nose, mouth, throat, and face: Denies mucositis or sore throat Respiratory: Denies cough, dyspnea or wheezes Cardiovascular: Denies palpitation, chest discomfort or lower extremity swelling Skin: Denies abnormal skin rashes Lymphatics: Denies new lymphadenopathy  Neurological:Denies numbness, tingling or new weaknesses Behavioral/Psych: Mood is stable, no new changes  All other systems were reviewed with the patient and are negative.  I have reviewed the past medical history, past surgical history, social history and family history with the patient and they are unchanged from previous note.  ALLERGIES:  is  allergic to bactrim [sulfamethoxazole w/trimethoprim (co-trimoxazole)].  MEDICATIONS:  Current Outpatient Prescriptions  Medication Sig Dispense Refill  . acyclovir (ZOVIRAX) 400 MG tablet Take 1 tablet (400 mg total) by mouth daily. 90 tablet 3  . calcium citrate-vitamin D (CITRACAL+D) 315-200 MG-UNIT per tablet Take  1 tablet by mouth daily.     . Cholecalciferol (VITAMIN D) 400 UNITS capsule Take 1,000 Units by mouth at bedtime.     . diphenhydrAMINE (BENADRYL) 25 mg capsule Take 50 mg by mouth at bedtime as needed for allergies (allergies).     Adora Fridge VAGINAL 0.1 MG/GM vaginal cream Place 1 Applicatorful vaginally 2 (two) times a week.     . folic acid (FOLVITE) 409 MCG tablet Take 400 mcg by mouth daily.    Marland Kitchen glucosamine-chondroitin 500-400 MG tablet Take 1 tablet by mouth daily.      . idelalisib (ZYDELIG) 150 MG tablet Take 1 tablet (150 mg total) by mouth 2 (two) times daily. 60 tablet 11  . levETIRAcetam (KEPPRA XR) 500 MG 24 hr tablet TAKE 2 TABLETS BY MOUTH  DAILY 180 tablet 1  . lidocaine-prilocaine (EMLA) cream Apply 1 application topically as needed. 30 g 6  . loratadine (CLARITIN) 10 MG tablet Take 10 mg by mouth daily.      . montelukast (SINGULAIR) 10 MG tablet Take 10 mg by mouth daily.     . Multiple Vitamin (MULTIVITAMIN) tablet Take 1 tablet by mouth daily.      . polyvinyl alcohol (ARTIFICIAL TEARS) 1.4 % ophthalmic solution Place 1 drop into both eyes 3 (three) times daily as needed for dry eyes (dry eyes). Reported on 03/10/2016    . sodium chloride (OCEAN) 0.65 % SOLN nasal spray Place 1 spray into both nostrils 2 (two) times daily.     Marland Kitchen atorvastatin (LIPITOR) 20 MG tablet Take 20 mg by mouth every evening.     . ondansetron (ZOFRAN) 8 MG tablet Take 1 tablet (8 mg total) by mouth every 8 (eight) hours as needed for nausea. (Patient not taking: Reported on 01/12/2017) 30 tablet 3   No current facility-administered medications for this visit.    Facility-Administered Medications Ordered in Other Visits  Medication Dose Route Frequency Provider Last Rate Last Dose  . sodium chloride flush (NS) 0.9 % injection 10 mL  10 mL Intracatheter PRN Heath Lark, MD   10 mL at 01/12/17 1340    PHYSICAL EXAMINATION: ECOG PERFORMANCE STATUS: 1 - Symptomatic but completely ambulatory  Vitals:    01/12/17 1032  BP: (!) 160/52  Pulse: 80  Resp: 18  Temp: 97.7 F (36.5 C)   Filed Weights   01/12/17 1032  Weight: 114 lb 9.6 oz (52 kg)    GENERAL:alert, no distress and comfortable SKIN: skin color, texture, turgor are normal, no rashes or significant lesions. NOted mild bruising EYES: normal, Conjunctiva are pink and non-injected, sclera clear OROPHARYNX:no exudate, no erythema and lips, buccal mucosa, and tongue normal  NECK: supple, thyroid normal size, non-tender, without nodularity LYMPH:  no palpable lymphadenopathy in the cervical, axillary or inguinal LUNGS: clear to auscultation and percussion with normal breathing effort HEART: regular rate & rhythm and no murmurs and no lower extremity edema ABDOMEN:abdomen soft, non-tender and normal bowel sounds Musculoskeletal:no cyanosis of digits and no clubbing  NEURO: alert & oriented x 3 with fluent speech, no focal motor/sensory deficits  LABORATORY DATA:  I have reviewed the data as listed    Component Value Date/Time   NA 137 12/01/2016  0801   K 3.8 12/01/2016 0801   CL 109 05/13/2016 1238   CL 99 04/08/2013 0922   CO2 22 12/01/2016 0801   GLUCOSE 94 12/01/2016 0801   GLUCOSE 92 04/08/2013 0922   BUN 15.2 12/01/2016 0801   CREATININE 0.9 12/01/2016 0801   CALCIUM 9.8 12/01/2016 0801   PROT 6.6 12/01/2016 0801   ALBUMIN 3.9 12/01/2016 0801   AST 22 12/01/2016 0801   ALT 14 12/01/2016 0801   ALKPHOS 53 12/01/2016 0801   BILITOT 0.65 12/01/2016 0801   GFRNONAA 53 (L) 05/13/2016 1238   GFRAA >60 05/13/2016 1238    No results found for: SPEP, UPEP  Lab Results  Component Value Date   WBC 10.5 (H) 01/12/2017   NEUTROABS 0.4 (LL) 01/12/2017   HGB 12.5 01/12/2017   HCT 37.5 01/12/2017   MCV 88.7 01/12/2017   PLT 133 (L) 01/12/2017      Chemistry      Component Value Date/Time   NA 137 12/01/2016 0801   K 3.8 12/01/2016 0801   CL 109 05/13/2016 1238   CL 99 04/08/2013 0922   CO2 22 12/01/2016 0801    BUN 15.2 12/01/2016 0801   CREATININE 0.9 12/01/2016 0801      Component Value Date/Time   CALCIUM 9.8 12/01/2016 0801   ALKPHOS 53 12/01/2016 0801   AST 22 12/01/2016 0801   ALT 14 12/01/2016 0801   BILITOT 0.65 12/01/2016 0801       RADIOGRAPHIC STUDIES: I have personally reviewed the radiological images as listed and agreed with the findings in the report. Nm Pet Image Restag (ps) Skull Base To Thigh  Result Date: 01/06/2017 CLINICAL DATA:  Subsequent treatment strategy for stage IV CLL initially diagnosed February 2015, presenting for assessment of therapy response. EXAM: NUCLEAR MEDICINE PET SKULL BASE TO THIGH TECHNIQUE: 5.6 mCi F-18 FDG was injected intravenously. Full-ring PET imaging was performed from the skull base to thigh after the radiotracer. CT data was obtained and used for attenuation correction and anatomic localization. FASTING BLOOD GLUCOSE:  Value: 92 mg/dl COMPARISON:  10/11/2016 PET-CT. FINDINGS: NECK No residual hypermetabolic lymph nodes in the neck. Top-normal size and mildly enlarged bilateral levels 2, 3, 4 and 5 lymph nodes in the neck have all decreased in size since 10/31/2016 and are non hypermetabolic. CHEST Left anterior descending, left circumflex and right coronary atherosclerosis. Atherosclerotic nonaneurysmal thoracic aorta. Right internal jugular MediPort terminates at the cavoatrial junction. Stable coarse calcifications at the posterior margin of the mid to upper thoracic esophagus just above the level of the carina. No pleural effusions. No pneumothorax. Mildly enlarged bilateral axillary lymph nodes have all decreased in size and demonstrate no residual hypermetabolism. Normal size 0.7 cm right paratracheal lymph node (series 4/image 57) demonstrates mild hypermetabolism with max SUV 3.8, previously 0.8 cm with max SUV 6.4, stable in size, decreased in metabolism. Normal size 0.6 cm right hilar lymph node (series 4/image 62) demonstrates mild  hypermetabolism with max SUV 4.2, previously 0.9 cm with max SUV 5.6, decreased in size and metabolism. No additional enlarged or hypermetabolic mediastinal or hilar lymph nodes. Mosaic attenuation throughout both lungs. No acute consolidative airspace disease, lung masses or significant pulmonary nodules. ABDOMEN/PELVIS No residual hypermetabolic lymph nodes in the abdomen or pelvis. Mildly enlarged portacaval, aortocaval, left para-aortic, bilateral common iliac, external iliac and inguinal lymph nodes have all decreased in size since 10/31/2016 and demonstrate no hypermetabolism. For example a lower aortocaval 1.3 cm node (series 4/image 121) is decreased  from 2.3 cm . A right external iliac 1.5 cm node (series 4/image 151) is decreased from 2.7 cm. No abnormal hypermetabolic activity within the liver, pancreas, adrenal glands, or spleen. Cholecystectomy. Hysterectomy. Atherosclerotic nonaneurysmal abdominal aorta. SKELETON No focal hypermetabolic activity to suggest skeletal metastasis. IMPRESSION: 1. Significant partial treatment response. Persistent mild hypermetabolism within solitary right paratracheal and solitary right hilar lymph nodes, which both demonstrate decreased metabolism. Otherwise complete metabolic response. Residual mildly enlarged bilateral neck, axillary, retroperitoneal and pelvic lymph nodes have all decreased in size and demonstrate no hypermetabolism. 2. Additional findings include aortic atherosclerosis, three-vessel coronary atherosclerosis and mosaic attenuation in both lungs most commonly due to air trapping from small airways disease Electronically Signed   By: Ilona Sorrel M.D.   On: 01/06/2017 10:58    ASSESSMENT & PLAN:  CLL (chronic lymphocytic leukemia) Recent PET CT scan show significant disease control with greater than 50% response rate on rituximab alone. Her Idelisib recently got approved. Today, she is noted to be neutropenic. I have told her to hold Idelisib and  return next week for blood count check. We will proceed with rituximab today without delay. After today's dose, she will return every 4 weeks for rituximab, along with Idelisib I plan to repeat imaging study again in 3 months, next due in June 2018  Neutropenia This is due to recent treatment. She will hold Idelisib above. We will proceed with rituximab   Thrombocytopenia ([place]) She is not symptomatic apart from minor bruising. I will observe for now. This is likely due to disease and recent treatment.   Essential hypertension she will continue current medical management. I recommend close follow-up with primary care doctor for medication adjustment.    Orders Placed This Encounter  Procedures  . Comprehensive metabolic panel    Standing Status:   Standing    Number of Occurrences:   22    Standing Expiration Date:   01/12/2018   All questions were answered. The patient knows to call the clinic with any problems, questions or concerns. No barriers to learning was detected. I spent 20 minutes counseling the patient face to face. The total time spent in the appointment was 25 minutes and more than 50% was on counseling and review of test results     Heath Lark, MD 01/12/2017 1:46 PM

## 2017-01-12 NOTE — Assessment & Plan Note (Signed)
she will continue current medical management. I recommend close follow-up with primary care doctor for medication adjustment.  

## 2017-01-12 NOTE — Patient Instructions (Signed)
[facility] Discharge Instructions for Patients Receiving Chemotherapy  Today you received the following chemotherapy agents rituxan.   To help prevent nausea and vomiting after your treatment, we encourage you to take your nausea medication as directed.     If you develop nausea and vomiting that is not controlled by your nausea medication, call the clinic.   BELOW ARE SYMPTOMS THAT SHOULD BE REPORTED IMMEDIATELY:  *FEVER GREATER THAN 100.5 F  *CHILLS WITH OR WITHOUT FEVER  NAUSEA AND VOMITING THAT IS NOT CONTROLLED WITH YOUR NAUSEA MEDICATION  *UNUSUAL SHORTNESS OF BREATH  *UNUSUAL BRUISING OR BLEEDING  TENDERNESS IN MOUTH AND THROAT WITH OR WITHOUT PRESENCE OF ULCERS  *URINARY PROBLEMS  *BOWEL PROBLEMS  UNUSUAL RASH Items with * indicate a potential emergency and should be followed up as soon as possible.  Feel free to call the clinic you have any questions or concerns. The clinic phone number is [phone].  

## 2017-01-12 NOTE — Assessment & Plan Note (Signed)
She is not symptomatic apart from minor bruising. I will observe for now. This is likely due to disease and recent treatment.

## 2017-01-12 NOTE — Assessment & Plan Note (Signed)
Recent PET CT scan show significant disease control with greater than 50% response rate on rituximab alone. Her Idelisib recently got approved. Today, she is noted to be neutropenic. I have told her to hold Idelisib and return next week for blood count check. We will proceed with rituximab today without delay. After today's dose, she will return every 4 weeks for rituximab, along with Idelisib I plan to repeat imaging study again in 3 months, next due in June 2018

## 2017-01-12 NOTE — Patient Instructions (Signed)
Implanted Port Home Guide An implanted port is a type of central line that is placed under the skin. Central lines are used to provide IV access when treatment or nutrition needs to be given through a person's veins. Implanted ports are used for long-term IV access. An implanted port may be placed because:  You need IV medicine that would be irritating to the small veins in your hands or arms.  You need long-term IV medicines, such as antibiotics.  You need IV nutrition for a long period.  You need frequent blood draws for lab tests.  You need dialysis.  Implanted ports are usually placed in the chest area, but they can also be placed in the upper arm, the abdomen, or the leg. An implanted port has two main parts:  Reservoir. The reservoir is round and will appear as a small, raised area under your skin. The reservoir is the part where a needle is inserted to give medicines or draw blood.  Catheter. The catheter is a thin, flexible tube that extends from the reservoir. The catheter is placed into a large vein. Medicine that is inserted into the reservoir goes into the catheter and then into the vein.  How will I care for my incision site? Do not get the incision site wet. Bathe or shower as directed by your health care provider. How is my port accessed? Special steps must be taken to access the port:  Before the port is accessed, a numbing cream can be placed on the skin. This helps numb the skin over the port site.  Your health care provider uses a sterile technique to access the port. ? Your health care provider must put on a mask and sterile gloves. ? The skin over your port is cleaned carefully with an antiseptic and allowed to dry. ? The port is gently pinched between sterile gloves, and a needle is inserted into the port.  Only "non-coring" port needles should be used to access the port. Once the port is accessed, a blood return should be checked. This helps ensure that the port  is in the vein and is not clogged.  If your port needs to remain accessed for a constant infusion, a clear (transparent) bandage will be placed over the needle site. The bandage and needle will need to be changed every week, or as directed by your health care provider.  Keep the bandage covering the needle clean and dry. Do not get it wet. Follow your health care provider's instructions on how to take a shower or bath while the port is accessed.  If your port does not need to stay accessed, no bandage is needed over the port.  What is flushing? Flushing helps keep the port from getting clogged. Follow your health care provider's instructions on how and when to flush the port. Ports are usually flushed with saline solution or a medicine called heparin. The need for flushing will depend on how the port is used.  If the port is used for intermittent medicines or blood draws, the port will need to be flushed: ? After medicines have been given. ? After blood has been drawn. ? As part of routine maintenance.  If a constant infusion is running, the port may not need to be flushed.  How long will my port stay implanted? The port can stay in for as long as your health care provider thinks it is needed. When it is time for the port to come out, surgery will be   done to remove it. The procedure is similar to the one performed when the port was put in. When should I seek immediate medical care? When you have an implanted port, you should seek immediate medical care if:  You notice a bad smell coming from the incision site.  You have swelling, redness, or drainage at the incision site.  You have more swelling or pain at the port site or the surrounding area.  You have a fever that is not controlled with medicine.  This information is not intended to replace advice given to you by your health care provider. Make sure you discuss any questions you have with your health care provider. Document  Released: 10/20/2005 Document Revised: 03/27/2016 Document Reviewed: 06/27/2013 Elsevier Interactive Patient Education  2017 [employer]  

## 2017-01-12 NOTE — Progress Notes (Signed)
Okay to treat today with neutrophils of 0.4 per Dr. Alvy Bimler.

## 2017-01-19 ENCOUNTER — Other Ambulatory Visit (HOSPITAL_BASED_OUTPATIENT_CLINIC_OR_DEPARTMENT_OTHER): Payer: Medicare Other

## 2017-01-19 ENCOUNTER — Telehealth: Payer: Self-pay | Admitting: *Deleted

## 2017-01-19 DIAGNOSIS — D696 Thrombocytopenia, unspecified: Secondary | ICD-10-CM

## 2017-01-19 DIAGNOSIS — C9110 Chronic lymphocytic leukemia of B-cell type not having achieved remission: Secondary | ICD-10-CM | POA: Diagnosis not present

## 2017-01-19 LAB — CBC WITH DIFFERENTIAL/PLATELET
BASO%: 1.3 % (ref 0.0–2.0)
BASOS ABS: 0.1 10*3/uL (ref 0.0–0.1)
EOS ABS: 0.4 10*3/uL (ref 0.0–0.5)
EOS%: 4.6 % (ref 0.0–7.0)
HCT: 37.7 % (ref 34.8–46.6)
HGB: 12.6 g/dL (ref 11.6–15.9)
LYMPH%: 45.3 % (ref 14.0–49.7)
MCH: 29.4 pg (ref 25.1–34.0)
MCHC: 33.4 g/dL (ref 31.5–36.0)
MCV: 88.1 fL (ref 79.5–101.0)
MONO#: 1 10*3/uL — AB (ref 0.1–0.9)
MONO%: 12.9 % (ref 0.0–14.0)
NEUT#: 2.8 10*3/uL (ref 1.5–6.5)
NEUT%: 35.9 % — AB (ref 38.4–76.8)
PLATELETS: 82 10*3/uL — AB (ref 145–400)
RBC: 4.28 10*6/uL (ref 3.70–5.45)
RDW: 13.3 % (ref 11.2–14.5)
WBC: 7.8 10*3/uL (ref 3.9–10.3)
lymph#: 3.5 10*3/uL — ABNORMAL HIGH (ref 0.9–3.3)

## 2017-01-19 LAB — COMPREHENSIVE METABOLIC PANEL
ALT: 21 U/L (ref 0–55)
AST: 21 U/L (ref 5–34)
Albumin: 3.8 g/dL (ref 3.5–5.0)
Alkaline Phosphatase: 84 U/L (ref 40–150)
Anion Gap: 8 mEq/L (ref 3–11)
BUN: 11.2 mg/dL (ref 7.0–26.0)
CALCIUM: 9.9 mg/dL (ref 8.4–10.4)
CHLORIDE: 106 meq/L (ref 98–109)
CO2: 26 mEq/L (ref 22–29)
Creatinine: 0.8 mg/dL (ref 0.6–1.1)
EGFR: 66 mL/min/{1.73_m2} — AB (ref >90)
Glucose: 80 mg/dl (ref 70–140)
Potassium: 5 mEq/L (ref 3.5–5.1)
Sodium: 141 mEq/L (ref 136–145)
Total Bilirubin: 0.39 mg/dL (ref 0.20–1.20)
Total Protein: 6.4 g/dL (ref 6.4–8.3)

## 2017-01-19 LAB — LACTATE DEHYDROGENASE: LDH: 172 U/L (ref 125–245)

## 2017-01-19 NOTE — Telephone Encounter (Signed)
-----   Message from Heath Lark, MD sent at 01/19/2017  1:19 PM EDT ----- Regarding: labs better OK to resume Zydelig

## 2017-01-19 NOTE — Telephone Encounter (Signed)
Notified of message below

## 2017-02-09 ENCOUNTER — Telehealth: Payer: Self-pay | Admitting: Hematology and Oncology

## 2017-02-09 ENCOUNTER — Other Ambulatory Visit (HOSPITAL_BASED_OUTPATIENT_CLINIC_OR_DEPARTMENT_OTHER): Payer: Medicare Other

## 2017-02-09 ENCOUNTER — Ambulatory Visit: Payer: Medicare Other

## 2017-02-09 ENCOUNTER — Ambulatory Visit (HOSPITAL_BASED_OUTPATIENT_CLINIC_OR_DEPARTMENT_OTHER): Payer: Medicare Other | Admitting: Hematology and Oncology

## 2017-02-09 ENCOUNTER — Encounter: Payer: Self-pay | Admitting: Hematology and Oncology

## 2017-02-09 ENCOUNTER — Ambulatory Visit (HOSPITAL_BASED_OUTPATIENT_CLINIC_OR_DEPARTMENT_OTHER): Payer: Medicare Other

## 2017-02-09 VITALS — BP 134/53 | HR 68 | Temp 97.1°F | Resp 18

## 2017-02-09 DIAGNOSIS — D696 Thrombocytopenia, unspecified: Secondary | ICD-10-CM

## 2017-02-09 DIAGNOSIS — Z5112 Encounter for antineoplastic immunotherapy: Secondary | ICD-10-CM

## 2017-02-09 DIAGNOSIS — C9110 Chronic lymphocytic leukemia of B-cell type not having achieved remission: Secondary | ICD-10-CM

## 2017-02-09 DIAGNOSIS — D801 Nonfamilial hypogammaglobulinemia: Secondary | ICD-10-CM

## 2017-02-09 DIAGNOSIS — I10 Essential (primary) hypertension: Secondary | ICD-10-CM

## 2017-02-09 LAB — COMPREHENSIVE METABOLIC PANEL
ALT: 20 U/L (ref 0–55)
AST: 22 U/L (ref 5–34)
Albumin: 4.3 g/dL (ref 3.5–5.0)
Alkaline Phosphatase: 62 U/L (ref 40–150)
Anion Gap: 11 mEq/L (ref 3–11)
BUN: 13.3 mg/dL (ref 7.0–26.0)
CHLORIDE: 101 meq/L (ref 98–109)
CO2: 24 mEq/L (ref 22–29)
Calcium: 10.2 mg/dL (ref 8.4–10.4)
Creatinine: 0.8 mg/dL (ref 0.6–1.1)
EGFR: 72 mL/min/{1.73_m2} — ABNORMAL LOW (ref >90)
GLUCOSE: 87 mg/dL (ref 70–140)
POTASSIUM: 4.2 meq/L (ref 3.5–5.1)
SODIUM: 136 meq/L (ref 136–145)
Total Bilirubin: 0.56 mg/dL (ref 0.20–1.20)
Total Protein: 6.5 g/dL (ref 6.4–8.3)

## 2017-02-09 LAB — CBC WITH DIFFERENTIAL/PLATELET
BASO%: 0.6 % (ref 0.0–2.0)
BASOS ABS: 0.1 10*3/uL (ref 0.0–0.1)
EOS%: 3 % (ref 0.0–7.0)
Eosinophils Absolute: 0.3 10*3/uL (ref 0.0–0.5)
HCT: 38.6 % (ref 34.8–46.6)
HGB: 13 g/dL (ref 11.6–15.9)
LYMPH%: 64.4 % — ABNORMAL HIGH (ref 14.0–49.7)
MCH: 29 pg (ref 25.1–34.0)
MCHC: 33.7 g/dL (ref 31.5–36.0)
MCV: 86.2 fL (ref 79.5–101.0)
MONO#: 0.7 10*3/uL (ref 0.1–0.9)
MONO%: 6.4 % (ref 0.0–14.0)
NEUT#: 2.9 10*3/uL (ref 1.5–6.5)
NEUT%: 25.6 % — AB (ref 38.4–76.8)
Platelets: 124 10*3/uL — ABNORMAL LOW (ref 145–400)
RBC: 4.48 10*6/uL (ref 3.70–5.45)
RDW: 13.4 % (ref 11.2–14.5)
WBC: 11.5 10*3/uL — ABNORMAL HIGH (ref 3.9–10.3)
lymph#: 7.4 10*3/uL — ABNORMAL HIGH (ref 0.9–3.3)

## 2017-02-09 LAB — LACTATE DEHYDROGENASE: LDH: 156 U/L (ref 125–245)

## 2017-02-09 MED ORDER — DIPHENHYDRAMINE HCL 25 MG PO CAPS
ORAL_CAPSULE | ORAL | Status: AC
  Filled 2017-02-09: qty 2

## 2017-02-09 MED ORDER — DIPHENHYDRAMINE HCL 25 MG PO CAPS
50.0000 mg | ORAL_CAPSULE | Freq: Once | ORAL | Status: AC
  Administered 2017-02-09: 50 mg via ORAL

## 2017-02-09 MED ORDER — SODIUM CHLORIDE 0.9 % IV SOLN
Freq: Once | INTRAVENOUS | Status: AC
  Administered 2017-02-09: 13:00:00 via INTRAVENOUS

## 2017-02-09 MED ORDER — ACETAMINOPHEN 325 MG PO TABS
ORAL_TABLET | ORAL | Status: AC
  Filled 2017-02-09: qty 2

## 2017-02-09 MED ORDER — ACETAMINOPHEN 325 MG PO TABS
650.0000 mg | ORAL_TABLET | Freq: Once | ORAL | Status: AC
  Administered 2017-02-09: 650 mg via ORAL

## 2017-02-09 MED ORDER — SODIUM CHLORIDE 0.9% FLUSH
10.0000 mL | Freq: Once | INTRAVENOUS | Status: AC
Start: 2017-02-09 — End: 2017-02-09
  Administered 2017-02-09: 10 mL
  Filled 2017-02-09: qty 10

## 2017-02-09 MED ORDER — SODIUM CHLORIDE 0.9 % IV SOLN
500.0000 mg/m2 | Freq: Once | INTRAVENOUS | Status: AC
  Administered 2017-02-09: 800 mg via INTRAVENOUS
  Filled 2017-02-09: qty 50

## 2017-02-09 MED ORDER — SODIUM CHLORIDE 0.9% FLUSH
10.0000 mL | INTRAVENOUS | Status: DC | PRN
  Administered 2017-02-09: 10 mL
  Filled 2017-02-09: qty 10

## 2017-02-09 MED ORDER — HEPARIN SOD (PORK) LOCK FLUSH 100 UNIT/ML IV SOLN
500.0000 [IU] | Freq: Once | INTRAVENOUS | Status: AC | PRN
  Administered 2017-02-09: 500 [IU]
  Filled 2017-02-09: qty 5

## 2017-02-09 MED ORDER — MUPIROCIN 2 % EX OINT: 1.0000 "application " | TOPICAL_OINTMENT | Freq: Two times a day (BID) | CUTANEOUS | 2 refills | Status: DC

## 2017-02-09 NOTE — Patient Instructions (Signed)
Implanted Port Home Guide An implanted port is a type of central line that is placed under the skin. Central lines are used to provide IV access when treatment or nutrition needs to be given through a person's veins. Implanted ports are used for long-term IV access. An implanted port may be placed because:  You need IV medicine that would be irritating to the small veins in your hands or arms.  You need long-term IV medicines, such as antibiotics.  You need IV nutrition for a long period.  You need frequent blood draws for lab tests.  You need dialysis.  Implanted ports are usually placed in the chest area, but they can also be placed in the upper arm, the abdomen, or the leg. An implanted port has two main parts:  Reservoir. The reservoir is round and will appear as a small, raised area under your skin. The reservoir is the part where a needle is inserted to give medicines or draw blood.  Catheter. The catheter is a thin, flexible tube that extends from the reservoir. The catheter is placed into a large vein. Medicine that is inserted into the reservoir goes into the catheter and then into the vein.  How will I care for my incision site? Do not get the incision site wet. Bathe or shower as directed by your health care provider. How is my port accessed? Special steps must be taken to access the port:  Before the port is accessed, a numbing cream can be placed on the skin. This helps numb the skin over the port site.  Your health care provider uses a sterile technique to access the port. ? Your health care provider must put on a mask and sterile gloves. ? The skin over your port is cleaned carefully with an antiseptic and allowed to dry. ? The port is gently pinched between sterile gloves, and a needle is inserted into the port.  Only "non-coring" port needles should be used to access the port. Once the port is accessed, a blood return should be checked. This helps ensure that the port  is in the vein and is not clogged.  If your port needs to remain accessed for a constant infusion, a clear (transparent) bandage will be placed over the needle site. The bandage and needle will need to be changed every week, or as directed by your health care provider.  Keep the bandage covering the needle clean and dry. Do not get it wet. Follow your health care provider's instructions on how to take a shower or bath while the port is accessed.  If your port does not need to stay accessed, no bandage is needed over the port.  What is flushing? Flushing helps keep the port from getting clogged. Follow your health care provider's instructions on how and when to flush the port. Ports are usually flushed with saline solution or a medicine called heparin. The need for flushing will depend on how the port is used.  If the port is used for intermittent medicines or blood draws, the port will need to be flushed: ? After medicines have been given. ? After blood has been drawn. ? As part of routine maintenance.  If a constant infusion is running, the port may not need to be flushed.  How long will my port stay implanted? The port can stay in for as long as your health care provider thinks it is needed. When it is time for the port to come out, surgery will be   done to remove it. The procedure is similar to the one performed when the port was put in. When should I seek immediate medical care? When you have an implanted port, you should seek immediate medical care if:  You notice a bad smell coming from the incision site.  You have swelling, redness, or drainage at the incision site.  You have more swelling or pain at the port site or the surrounding area.  You have a fever that is not controlled with medicine.  This information is not intended to replace advice given to you by your health care provider. Make sure you discuss any questions you have with your health care provider. Document  Released: 10/20/2005 Document Revised: 03/27/2016 Document Reviewed: 06/27/2013 Elsevier Interactive Patient Education  2017 [employer]  

## 2017-02-09 NOTE — Progress Notes (Signed)
[facility] OFFICE PROGRESS NOTE  Patient Care Team: Shon Baton, MD as PCP - General (Internal Medicine) Heath Lark, MD as Consulting Physician (Hematology and Oncology) Eustace Quail Tat, DO as Consulting Physician (Neurology)  SUMMARY OF ONCOLOGIC HISTORY: Oncology History   CLL stage IV, deletion 13q     CLL (chronic lymphocytic leukemia) ([place])   12/12/2013 Bone Marrow Biopsy    Bone marrow biopsy show significant involvement of CLL      12/13/2013 Procedure    Patient had placement of a port      12/14/2013 Imaging    CT scan of the chest, abdomen and pelvis showed significant lymphadenopathy and splenomegaly      12/20/2013 - 12/22/2013 Hospital Admission    The patient was admitted to the hospital to begin cycle 1 day 1 of chemotherapy due to risk of infusion reaction. She tolerated treatment well without major side effects.      12/20/2013 - 03/13/2014 Chemotherapy    started on cycle 1 of Obinutuzumab, chlorambucil and prednisone. All treatment placed on hold on 03/13/2014 due to persistent pancytopenia.      03/28/2014 Imaging    Repeat imaging study showed excellent response to treatment.      10/25/2014 Imaging    She has persistent mild lymphadenopathy and splenomegaly, improved compared to previous exam      04/23/2015 Imaging    Ct scan showed disease progression      05/09/2015 - 11/26/2015 Chemotherapy    She started taking ibrutinib.      11/26/2015 Adverse Reaction    Treatment  was placed on hold due to neutropenia and recent infection      02/11/2016 - 10/10/2016 Chemotherapy    She is started on monthly IVIG Rx for recurrent infection and acquired panhypogammaglobulinemia      11/17/2016 -  Chemotherapy    The patient is started on Rituximab. Idelisib was started on 2/1      01/06/2017 PET scan    Significant partial treatment response. Persistent mild hypermetabolism within solitary right paratracheal and solitary right hilar lymph nodes,  which both demonstrate decreased metabolism. Otherwise complete metabolic response. Residual mildly enlarged bilateral neck, axillary, retroperitoneal and pelvic lymph nodes have all decreased in size and demonstrate no hypermetabolism. 2. Additional findings include aortic atherosclerosis, three-vessel coronary atherosclerosis and mosaic attenuation in both lungs most commonly due to air trapping from small airways disease      01/12/2017 Adverse Reaction    Idelisib is placed on hold due to neutropenia       INTERVAL HISTORY: Please see below for problem oriented charting. She returns for further treatment. She feels well. She continues to have new allergy symptoms. Her mild skin bruises. She has intermittent diarrhea, relieved by Imodium.  REVIEW OF SYSTEMS:   Constitutional: Denies fevers, chills or abnormal weight loss Eyes: Denies blurriness of vision Ears, nose, mouth, throat, and face: Denies mucositis or sore throat Respiratory: Denies cough, dyspnea or wheezes Cardiovascular: Denies palpitation, chest discomfort or lower extremity swelling Skin: Denies abnormal skin rashes Lymphatics: Denies new lymphadenopathy Neurological:Denies numbness, tingling or new weaknesses Behavioral/Psych: Mood is stable, no new changes  All other systems were reviewed with the patient and are negative.  I have reviewed the past medical history, past surgical history, social history and family history with the patient and they are unchanged from previous note.  ALLERGIES:  is allergic to bactrim [sulfamethoxazole w/trimethoprim (co-trimoxazole)].  MEDICATIONS:  Current Outpatient Prescriptions  Medication Sig Dispense  Refill  . acyclovir (ZOVIRAX) 400 MG tablet Take 1 tablet (400 mg total) by mouth daily. 90 tablet 3  . atorvastatin (LIPITOR) 20 MG tablet Take 20 mg by mouth every evening.     . calcium citrate-vitamin D (CITRACAL+D) 315-200 MG-UNIT per tablet Take 1 tablet by mouth daily.      . Cholecalciferol (VITAMIN D) 400 UNITS capsule Take 1,000 Units by mouth at bedtime.     . diphenhydrAMINE (BENADRYL) 25 mg capsule Take 50 mg by mouth at bedtime as needed for allergies (allergies).     Adora Fridge VAGINAL 0.1 MG/GM vaginal cream Place 1 Applicatorful vaginally 2 (two) times a week.     . folic acid (FOLVITE) 710 MCG tablet Take 400 mcg by mouth daily.    Marland Kitchen glucosamine-chondroitin 500-400 MG tablet Take 1 tablet by mouth daily.      . idelalisib (ZYDELIG) 150 MG tablet Take 1 tablet (150 mg total) by mouth 2 (two) times daily. 60 tablet 11  . levETIRAcetam (KEPPRA XR) 500 MG 24 hr tablet TAKE 2 TABLETS BY MOUTH  DAILY 180 tablet 1  . lidocaine-prilocaine (EMLA) cream Apply 1 application topically as needed. 30 g 6  . loratadine (CLARITIN) 10 MG tablet Take 10 mg by mouth daily.      . montelukast (SINGULAIR) 10 MG tablet Take 10 mg by mouth daily.     . Multiple Vitamin (MULTIVITAMIN) tablet Take 1 tablet by mouth daily.      . mupirocin ointment (BACTROBAN) 2 % Place 1 application into the nose 2 (two) times daily. 22 g 2  . ondansetron (ZOFRAN) 8 MG tablet Take 1 tablet (8 mg total) by mouth every 8 (eight) hours as needed for nausea. (Patient not taking: Reported on 01/12/2017) 30 tablet 3  . polyvinyl alcohol (ARTIFICIAL TEARS) 1.4 % ophthalmic solution Place 1 drop into both eyes 3 (three) times daily as needed for dry eyes (dry eyes). Reported on 03/10/2016    . sodium chloride (OCEAN) 0.65 % SOLN nasal spray Place 1 spray into both nostrils 2 (two) times daily.      No current facility-administered medications for this visit.     PHYSICAL EXAMINATION: ECOG PERFORMANCE STATUS: 1 - Symptomatic but completely ambulatory  Vitals:   02/09/17 1233  BP: (!) 156/80  Pulse: 67  Resp: 18  Temp: 97.8 F (36.6 C)   Filed Weights   02/09/17 1233  Weight: 114 lb 6.4 oz (51.9 kg)    GENERAL:alert, no distress and comfortable SKIN: skin color, texture, turgor are normal,  no rashes or significant lesions.  Noted skin bruises EYES: normal, Conjunctiva are pink and non-injected, sclera clear OROPHARYNX:no exudate, no erythema and lips, buccal mucosa, and tongue normal  NECK: supple, thyroid normal size, non-tender, without nodularity LYMPH:  no palpable lymphadenopathy in the cervical, axillary or inguinal LUNGS: clear to auscultation and percussion with normal breathing effort HEART: regular rate & rhythm and no murmurs and no lower extremity edema ABDOMEN:abdomen soft, non-tender and normal bowel sounds Musculoskeletal:no cyanosis of digits and no clubbing  NEURO: alert & oriented x 3 with fluent speech, no focal motor/sensory deficits  LABORATORY DATA:  I have reviewed the data as listed    Component Value Date/Time   NA 141 01/19/2017 1120   K 5.0 01/19/2017 1120   CL 109 05/13/2016 1238   CL 99 04/08/2013 0922   CO2 26 01/19/2017 1120   GLUCOSE 80 01/19/2017 1120   GLUCOSE 92 04/08/2013 0922  BUN 11.2 01/19/2017 1120   CREATININE 0.8 01/19/2017 1120   CALCIUM 9.9 01/19/2017 1120   PROT 6.4 01/19/2017 1120   ALBUMIN 3.8 01/19/2017 1120   AST 21 01/19/2017 1120   ALT 21 01/19/2017 1120   ALKPHOS 84 01/19/2017 1120   BILITOT 0.39 01/19/2017 1120   GFRNONAA 53 (L) 05/13/2016 1238   GFRAA >60 05/13/2016 1238    No results found for: SPEP, UPEP  Lab Results  Component Value Date   WBC 11.5 (H) 02/09/2017   NEUTROABS 2.9 02/09/2017   HGB 13.0 02/09/2017   HCT 38.6 02/09/2017   MCV 86.2 02/09/2017   PLT 124 (L) 02/09/2017      Chemistry      Component Value Date/Time   NA 141 01/19/2017 1120   K 5.0 01/19/2017 1120   CL 109 05/13/2016 1238   CL 99 04/08/2013 0922   CO2 26 01/19/2017 1120   BUN 11.2 01/19/2017 1120   CREATININE 0.8 01/19/2017 1120      Component Value Date/Time   CALCIUM 9.9 01/19/2017 1120   ALKPHOS 84 01/19/2017 1120   AST 21 01/19/2017 1120   ALT 21 01/19/2017 1120   BILITOT 0.39 01/19/2017 1120      ASSESSMENT & PLAN:  CLL (chronic lymphocytic leukemia) Recent PET CT scan show significant disease control with greater than 50% response rate on rituximab alone. She has started on Zydelig. She will return every 4 weeks for rituximab, along with Idelisib I plan to repeat imaging study again in 3 months, next due in June 2018  Thrombocytopenia [facility]) She is not symptomatic apart from minor bruising. I will observe for now. This is likely due to disease and recent treatment.   Essential hypertension she will continue current medical management. I recommend close follow-up with primary care doctor for medication adjustment.    No orders of the defined types were placed in this encounter.  All questions were answered. The patient knows to call the clinic with any problems, questions or concerns. No barriers to learning was detected. I spent 15 minutes counseling the patient face to face. The total time spent in the appointment was 20 minutes and more than 50% was on counseling and review of test results     Heath Lark, MD 02/09/2017 12:58 PM

## 2017-02-09 NOTE — Patient Instructions (Signed)
[facility] Discharge Instructions for Patients Receiving Chemotherapy  Today you received the following chemotherapy agents Rituxan  To help prevent nausea and vomiting after your treatment, we encourage you to take your nausea medication as directed.   If you develop nausea and vomiting that is not controlled by your nausea medication, call the clinic.   BELOW ARE SYMPTOMS THAT SHOULD BE REPORTED IMMEDIATELY:  *FEVER GREATER THAN 100.5 F  *CHILLS WITH OR WITHOUT FEVER  NAUSEA AND VOMITING THAT IS NOT CONTROLLED WITH YOUR NAUSEA MEDICATION  *UNUSUAL SHORTNESS OF BREATH  *UNUSUAL BRUISING OR BLEEDING  TENDERNESS IN MOUTH AND THROAT WITH OR WITHOUT PRESENCE OF ULCERS  *URINARY PROBLEMS  *BOWEL PROBLEMS  UNUSUAL RASH Items with * indicate a potential emergency and should be followed up as soon as possible.  Feel free to call the clinic you have any questions or concerns. The clinic phone number is [phone].    

## 2017-02-09 NOTE — Telephone Encounter (Signed)
Gave patient AVS and calender per 4/9 los. 

## 2017-02-09 NOTE — Assessment & Plan Note (Signed)
she will continue current medical management. I recommend close follow-up with primary care doctor for medication adjustment.  

## 2017-02-09 NOTE — Assessment & Plan Note (Signed)
Recent PET CT scan show significant disease control with greater than 50% response rate on rituximab alone. She has started on Zydelig. She will return every 4 weeks for rituximab, along with Idelisib I plan to repeat imaging study again in 3 months, next due in June 2018

## 2017-02-09 NOTE — Assessment & Plan Note (Signed)
She is not symptomatic apart from minor bruising. I will observe for now. This is likely due to disease and recent treatment.

## 2017-02-12 ENCOUNTER — Other Ambulatory Visit: Payer: Self-pay | Admitting: Neurology

## 2017-03-09 ENCOUNTER — Other Ambulatory Visit (HOSPITAL_BASED_OUTPATIENT_CLINIC_OR_DEPARTMENT_OTHER): Payer: Medicare Other

## 2017-03-09 ENCOUNTER — Ambulatory Visit (HOSPITAL_BASED_OUTPATIENT_CLINIC_OR_DEPARTMENT_OTHER): Payer: Medicare Other

## 2017-03-09 ENCOUNTER — Ambulatory Visit: Payer: Medicare Other

## 2017-03-09 ENCOUNTER — Encounter: Payer: Self-pay | Admitting: Hematology and Oncology

## 2017-03-09 ENCOUNTER — Telehealth: Payer: Self-pay | Admitting: Hematology and Oncology

## 2017-03-09 ENCOUNTER — Ambulatory Visit (HOSPITAL_BASED_OUTPATIENT_CLINIC_OR_DEPARTMENT_OTHER): Payer: Medicare Other | Admitting: Hematology and Oncology

## 2017-03-09 VITALS — BP 119/50 | HR 71 | Temp 98.2°F | Resp 17

## 2017-03-09 DIAGNOSIS — R197 Diarrhea, unspecified: Secondary | ICD-10-CM

## 2017-03-09 DIAGNOSIS — D696 Thrombocytopenia, unspecified: Secondary | ICD-10-CM

## 2017-03-09 DIAGNOSIS — C9110 Chronic lymphocytic leukemia of B-cell type not having achieved remission: Secondary | ICD-10-CM | POA: Diagnosis not present

## 2017-03-09 DIAGNOSIS — Z5112 Encounter for antineoplastic immunotherapy: Secondary | ICD-10-CM | POA: Diagnosis not present

## 2017-03-09 DIAGNOSIS — D801 Nonfamilial hypogammaglobulinemia: Secondary | ICD-10-CM

## 2017-03-09 DIAGNOSIS — J0141 Acute recurrent pansinusitis: Secondary | ICD-10-CM | POA: Diagnosis not present

## 2017-03-09 DIAGNOSIS — R748 Abnormal levels of other serum enzymes: Secondary | ICD-10-CM | POA: Diagnosis not present

## 2017-03-09 LAB — CBC WITH DIFFERENTIAL/PLATELET
BASO%: 1.1 % (ref 0.0–2.0)
Basophils Absolute: 0.1 10*3/uL (ref 0.0–0.1)
EOS ABS: 0.2 10*3/uL (ref 0.0–0.5)
EOS%: 2.6 % (ref 0.0–7.0)
HCT: 37.5 % (ref 34.8–46.6)
HGB: 12.9 g/dL (ref 11.6–15.9)
LYMPH%: 48.4 % (ref 14.0–49.7)
MCH: 28.9 pg (ref 25.1–34.0)
MCHC: 34.3 g/dL (ref 31.5–36.0)
MCV: 84.2 fL (ref 79.5–101.0)
MONO#: 1 10*3/uL — ABNORMAL HIGH (ref 0.1–0.9)
MONO%: 10.8 % (ref 0.0–14.0)
NEUT%: 37.1 % — ABNORMAL LOW (ref 38.4–76.8)
NEUTROS ABS: 3.3 10*3/uL (ref 1.5–6.5)
Platelets: 148 10*3/uL (ref 145–400)
RBC: 4.45 10*6/uL (ref 3.70–5.45)
RDW: 13.3 % (ref 11.2–14.5)
WBC: 9 10*3/uL (ref 3.9–10.3)
lymph#: 4.3 10*3/uL — ABNORMAL HIGH (ref 0.9–3.3)

## 2017-03-09 LAB — COMPREHENSIVE METABOLIC PANEL
ALT: 138 U/L — ABNORMAL HIGH (ref 0–55)
AST: 63 U/L — ABNORMAL HIGH (ref 5–34)
Albumin: 3.9 g/dL (ref 3.5–5.0)
Alkaline Phosphatase: 61 U/L (ref 40–150)
Anion Gap: 9 mEq/L (ref 3–11)
BUN: 12.5 mg/dL (ref 7.0–26.0)
CHLORIDE: 100 meq/L (ref 98–109)
CO2: 25 meq/L (ref 22–29)
Calcium: 9.6 mg/dL (ref 8.4–10.4)
Creatinine: 0.7 mg/dL (ref 0.6–1.1)
EGFR: 78 mL/min/{1.73_m2} — ABNORMAL LOW (ref >90)
GLUCOSE: 98 mg/dL (ref 70–140)
POTASSIUM: 4.5 meq/L (ref 3.5–5.1)
SODIUM: 133 meq/L — AB (ref 136–145)
Total Bilirubin: 0.39 mg/dL (ref 0.20–1.20)
Total Protein: 6.2 g/dL — ABNORMAL LOW (ref 6.4–8.3)

## 2017-03-09 LAB — LACTATE DEHYDROGENASE: LDH: 182 U/L (ref 125–245)

## 2017-03-09 MED ORDER — SODIUM CHLORIDE 0.9% FLUSH
10.0000 mL | INTRAVENOUS | Status: DC | PRN
  Administered 2017-03-09: 10 mL
  Filled 2017-03-09: qty 10

## 2017-03-09 MED ORDER — SODIUM CHLORIDE 0.9 % IV SOLN
Freq: Once | INTRAVENOUS | Status: AC
  Administered 2017-03-09: 11:00:00 via INTRAVENOUS

## 2017-03-09 MED ORDER — ACETAMINOPHEN 325 MG PO TABS
650.0000 mg | ORAL_TABLET | Freq: Once | ORAL | Status: AC
  Administered 2017-03-09: 650 mg via ORAL

## 2017-03-09 MED ORDER — SODIUM CHLORIDE 0.9% FLUSH
10.0000 mL | Freq: Once | INTRAVENOUS | Status: AC
  Administered 2017-03-09: 10 mL
  Filled 2017-03-09: qty 10

## 2017-03-09 MED ORDER — HEPARIN SOD (PORK) LOCK FLUSH 100 UNIT/ML IV SOLN
500.0000 [IU] | Freq: Once | INTRAVENOUS | Status: AC | PRN
  Administered 2017-03-09: 500 [IU]
  Filled 2017-03-09: qty 5

## 2017-03-09 MED ORDER — SODIUM CHLORIDE 0.9 % IV SOLN
500.0000 mg/m2 | Freq: Once | INTRAVENOUS | Status: AC
  Administered 2017-03-09: 800 mg via INTRAVENOUS
  Filled 2017-03-09: qty 50

## 2017-03-09 MED ORDER — DEXAMETHASONE SODIUM PHOSPHATE 10 MG/ML IJ SOLN
10.0000 mg | Freq: Once | INTRAMUSCULAR | Status: AC
  Administered 2017-03-09: 10 mg via INTRAVENOUS

## 2017-03-09 MED ORDER — HEPARIN SOD (PORK) LOCK FLUSH 100 UNIT/ML IV SOLN
500.0000 [IU] | Freq: Once | INTRAVENOUS | Status: DC
  Filled 2017-03-09: qty 5

## 2017-03-09 MED ORDER — DEXAMETHASONE SODIUM PHOSPHATE 10 MG/ML IJ SOLN
INTRAMUSCULAR | Status: AC
  Filled 2017-03-09: qty 1

## 2017-03-09 MED ORDER — SODIUM CHLORIDE 0.9 % IV SOLN
10.0000 mg | Freq: Once | INTRAVENOUS | Status: DC
  Filled 2017-03-09: qty 1

## 2017-03-09 MED ORDER — ACETAMINOPHEN 325 MG PO TABS
ORAL_TABLET | ORAL | Status: AC
Start: 2017-03-09 — End: 2017-03-09
  Filled 2017-03-09: qty 2

## 2017-03-09 MED ORDER — DIPHENHYDRAMINE HCL 25 MG PO CAPS
ORAL_CAPSULE | ORAL | Status: AC
  Filled 2017-03-09: qty 2

## 2017-03-09 MED ORDER — DIPHENHYDRAMINE HCL 25 MG PO CAPS
50.0000 mg | ORAL_CAPSULE | Freq: Once | ORAL | Status: AC
  Administered 2017-03-09: 50 mg via ORAL

## 2017-03-09 NOTE — Telephone Encounter (Signed)
Appointments scheduled per 5.7.18 LOS. Patient given AVS report and calendars with future scheduled appointments. °

## 2017-03-09 NOTE — Assessment & Plan Note (Signed)
Her liver enzymes could be due to chemotherapy. We will proceed with rituximab I will recommend holding off Zydelig for 1 week

## 2017-03-09 NOTE — Assessment & Plan Note (Signed)
Recent PET CT scan show significant disease control with greater than 50% response rate on rituximab alone. She has started on Zydelig. She will return every 4 weeks for rituximab, along with Idelisib I plan to repeat imaging study again in 3 months, next due in June 2018 With recent elevated liver enzymes, recommend she holds idyllic for 1 week but we will proceed with rituximab today.

## 2017-03-09 NOTE — Assessment & Plan Note (Signed)
She has recurrence of sinus congestion I would give her some dexamethasone given his treatment today along with her other medications, to be managed conservatively I do not believe she warrants antibiotic treatment

## 2017-03-09 NOTE — Assessment & Plan Note (Signed)
The diarrhea could be triggered by chemotherapy or unrelated I recommend conservative management and Imodium as needed for now

## 2017-03-09 NOTE — Patient Instructions (Signed)

## 2017-03-09 NOTE — Progress Notes (Signed)
[place] OFFICE PROGRESS NOTE  Patient Care Team: Shon Baton, MD as PCP - General (Internal Medicine) Heath Lark, MD as Consulting Physician (Hematology and Oncology) Tat, Eustace Quail, DO as Consulting Physician (Neurology)  SUMMARY OF ONCOLOGIC HISTORY: Oncology History   CLL stage IV, deletion 13q     CLL (chronic lymphocytic leukemia) ([place])   12/12/2013 Bone Marrow Biopsy    Bone marrow biopsy show significant involvement of CLL      12/13/2013 Procedure    Patient had placement of a port      12/14/2013 Imaging    CT scan of the chest, abdomen and pelvis showed significant lymphadenopathy and splenomegaly      12/20/2013 - 12/22/2013 Hospital Admission    The patient was admitted to the hospital to begin cycle 1 day 1 of chemotherapy due to risk of infusion reaction. She tolerated treatment well without major side effects.      12/20/2013 - 03/13/2014 Chemotherapy    started on cycle 1 of Obinutuzumab, chlorambucil and prednisone. All treatment placed on hold on 03/13/2014 due to persistent pancytopenia.      03/28/2014 Imaging    Repeat imaging study showed excellent response to treatment.      10/25/2014 Imaging    She has persistent mild lymphadenopathy and splenomegaly, improved compared to previous exam      04/23/2015 Imaging    Ct scan showed disease progression      05/09/2015 - 11/26/2015 Chemotherapy    She started taking ibrutinib.      11/26/2015 Adverse Reaction    Treatment  was placed on hold due to neutropenia and recent infection      02/11/2016 - 10/10/2016 Chemotherapy    She is started on monthly IVIG Rx for recurrent infection and acquired panhypogammaglobulinemia      11/17/2016 -  Chemotherapy    The patient is started on Rituximab. Idelisib was started on 2/1      01/06/2017 PET scan    Significant partial treatment response. Persistent mild hypermetabolism within solitary right paratracheal and solitary right hilar lymph nodes,  which both demonstrate decreased metabolism. Otherwise complete metabolic response. Residual mildly enlarged bilateral neck, axillary, retroperitoneal and pelvic lymph nodes have all decreased in size and demonstrate no hypermetabolism. 2. Additional findings include aortic atherosclerosis, three-vessel coronary atherosclerosis and mosaic attenuation in both lungs most commonly due to air trapping from small airways disease      01/12/2017 Adverse Reaction    Idelisib is placed on hold due to neutropenia       INTERVAL HISTORY: Please see below for problem oriented charting. She returns for further follow-up She continues to have mild sinus congestion No new lymphadenopathy No recent fever or chills She had mild intermittent diarrhea She complain of skin bruising  REVIEW OF SYSTEMS:   Constitutional: Denies fevers, chills or abnormal weight loss Eyes: Denies blurriness of vision Ears, nose, mouth, throat, and face: Denies mucositis or sore throat Respiratory: Denies cough, dyspnea or wheezes Cardiovascular: Denies palpitation, chest discomfort or lower extremity swelling Skin: Denies abnormal skin rashes Lymphatics: Denies new lymphadenopathy  Neurological:Denies numbness, tingling or new weaknesses Behavioral/Psych: Mood is stable, no new changes  All other systems were reviewed with the patient and are negative.  I have reviewed the past medical history, past surgical history, social history and family history with the patient and they are unchanged from previous note.  ALLERGIES:  is allergic to bactrim [sulfamethoxazole w/trimethoprim (co-trimoxazole)].  MEDICATIONS:  Current Outpatient  Prescriptions  Medication Sig Dispense Refill  . acyclovir (ZOVIRAX) 400 MG tablet Take 1 tablet (400 mg total) by mouth daily. 90 tablet 3  . calcium citrate-vitamin D (CITRACAL+D) 315-200 MG-UNIT per tablet Take 1 tablet by mouth daily.     . Cholecalciferol (VITAMIN D) 400 UNITS capsule  Take 1,000 Units by mouth at bedtime.     . diphenhydrAMINE (BENADRYL) 25 mg capsule Take 50 mg by mouth at bedtime as needed for allergies (allergies).     Adora Fridge VAGINAL 0.1 MG/GM vaginal cream Place 1 Applicatorful vaginally 2 (two) times a week.     . folic acid (FOLVITE) 993 MCG tablet Take 400 mcg by mouth daily.    Marland Kitchen glucosamine-chondroitin 500-400 MG tablet Take 1 tablet by mouth daily.      . idelalisib (ZYDELIG) 150 MG tablet Take 1 tablet (150 mg total) by mouth 2 (two) times daily. 60 tablet 11  . levETIRAcetam (KEPPRA XR) 500 MG 24 hr tablet TAKE 2 TABLETS BY MOUTH  DAILY 180 tablet 1  . lidocaine-prilocaine (EMLA) cream Apply 1 application topically as needed. 30 g 6  . loratadine (CLARITIN) 10 MG tablet Take 10 mg by mouth daily.      . montelukast (SINGULAIR) 10 MG tablet Take 10 mg by mouth daily.     . Multiple Vitamin (MULTIVITAMIN) tablet Take 1 tablet by mouth daily.      . mupirocin ointment (BACTROBAN) 2 % Place 1 application into the nose 2 (two) times daily. (Patient taking differently: Place 1 application into the nose 2 (two) times daily. Puts on cuts to skin) 22 g 2  . polyvinyl alcohol (ARTIFICIAL TEARS) 1.4 % ophthalmic solution Place 1 drop into both eyes 3 (three) times daily as needed for dry eyes (dry eyes). Reported on 03/10/2016    . sodium chloride (OCEAN) 0.65 % SOLN nasal spray Place 1 spray into both nostrils 2 (two) times daily.     Marland Kitchen atorvastatin (LIPITOR) 20 MG tablet Take 20 mg by mouth every evening.     . ondansetron (ZOFRAN) 8 MG tablet Take 1 tablet (8 mg total) by mouth every 8 (eight) hours as needed for nausea. (Patient not taking: Reported on 01/12/2017) 30 tablet 3   No current facility-administered medications for this visit.    Facility-Administered Medications Ordered in Other Visits  Medication Dose Route Frequency Provider Last Rate Last Dose  . dexamethasone (DECADRON) 10 mg in sodium chloride 0.9 % 50 mL IVPB  10 mg Intravenous Once  Alvy Bimler, Chapin Arduini, MD      . heparin lock flush 100 unit/mL  500 Units Intracatheter Once PRN Alvy Bimler, Dmari Schubring, MD      . sodium chloride flush (NS) 0.9 % injection 10 mL  10 mL Intracatheter PRN Alvy Bimler, Audley Hinojos, MD        PHYSICAL EXAMINATION: ECOG PERFORMANCE STATUS: 1 - Symptomatic but completely ambulatory  Vitals:   03/09/17 0926  BP: (!) 153/64  Pulse: 70  Resp: 18  Temp: 97.7 F (36.5 C)   Filed Weights   03/09/17 0926  Weight: 115 lb 12.8 oz (52.5 kg)    GENERAL:alert, no distress and comfortable SKIN: skin color, texture, turgor are normal, no rashes or significant lesions.  Noted skin bruising EYES: normal, Conjunctiva are pink and non-injected, sclera clear OROPHARYNX:no exudate, no erythema and lips, buccal mucosa, and tongue normal  NECK: supple, thyroid normal size, non-tender, without nodularity LYMPH:  no palpable lymphadenopathy in the cervical, axillary or inguinal LUNGS: clear  to auscultation and percussion with normal breathing effort HEART: regular rate & rhythm and no murmurs and no lower extremity edema ABDOMEN:abdomen soft, non-tender and normal bowel sounds Musculoskeletal:no cyanosis of digits and no clubbing  NEURO: alert & oriented x 3 with fluent speech, no focal motor/sensory deficits  LABORATORY DATA:  I have reviewed the data as listed    Component Value Date/Time   NA 133 (L) 03/09/2017 0859   K 4.5 03/09/2017 0859   CL 109 05/13/2016 1238   CL 99 04/08/2013 0922   CO2 25 03/09/2017 0859   GLUCOSE 98 03/09/2017 0859   GLUCOSE 92 04/08/2013 0922   BUN 12.5 03/09/2017 0859   CREATININE 0.7 03/09/2017 0859   CALCIUM 9.6 03/09/2017 0859   PROT 6.2 (L) 03/09/2017 0859   ALBUMIN 3.9 03/09/2017 0859   AST 63 (H) 03/09/2017 0859   ALT 138 (H) 03/09/2017 0859   ALKPHOS 61 03/09/2017 0859   BILITOT 0.39 03/09/2017 0859   GFRNONAA 53 (L) 05/13/2016 1238   GFRAA >60 05/13/2016 1238    No results found for: SPEP, UPEP  Lab Results  Component Value Date    WBC 9.0 03/09/2017   NEUTROABS 3.3 03/09/2017   HGB 12.9 03/09/2017   HCT 37.5 03/09/2017   MCV 84.2 03/09/2017   PLT 148 03/09/2017      Chemistry      Component Value Date/Time   NA 133 (L) 03/09/2017 0859   K 4.5 03/09/2017 0859   CL 109 05/13/2016 1238   CL 99 04/08/2013 0922   CO2 25 03/09/2017 0859   BUN 12.5 03/09/2017 0859   CREATININE 0.7 03/09/2017 0859      Component Value Date/Time   CALCIUM 9.6 03/09/2017 0859   ALKPHOS 61 03/09/2017 0859   AST 63 (H) 03/09/2017 0859   ALT 138 (H) 03/09/2017 0859   BILITOT 0.39 03/09/2017 0859       ASSESSMENT & PLAN:  CLL (chronic lymphocytic leukemia) Recent PET CT scan show significant disease control with greater than 50% response rate on rituximab alone. She has started on Zydelig. She will return every 4 weeks for rituximab, along with Idelisib I plan to repeat imaging study again in 3 months, next due in June 2018 With recent elevated liver enzymes, recommend she holds idyllic for 1 week but we will proceed with rituximab today.  Acute recurrent sinusitis She has recurrence of sinus congestion I would give her some dexamethasone given his treatment today along with her other medications, to be managed conservatively I do not believe she warrants antibiotic treatment  Elevated liver enzymes Her liver enzymes could be due to chemotherapy. We will proceed with rituximab I will recommend holding off Zydelig for 1 week  Diarrhea The diarrhea could be triggered by chemotherapy or unrelated I recommend conservative management and Imodium as needed for now   No orders of the defined types were placed in this encounter.  All questions were answered. The patient knows to call the clinic with any problems, questions or concerns. No barriers to learning was detected. I spent 25 minutes counseling the patient face to face. The total time spent in the appointment was 30 minutes and more than 50% was on counseling and  review of test results     Heath Lark, MD 03/09/2017 11:08 AM

## 2017-03-09 NOTE — Patient Instructions (Signed)
[facility] Discharge Instructions for Patients Receiving Chemotherapy  Today you received the following chemotherapy agents: Rituxan   To help prevent nausea and vomiting after your treatment, we encourage you to take your nausea medication as directed.    If you develop nausea and vomiting that is not controlled by your nausea medication, call the clinic.   BELOW ARE SYMPTOMS THAT SHOULD BE REPORTED IMMEDIATELY:  *FEVER GREATER THAN 100.5 F  *CHILLS WITH OR WITHOUT FEVER  NAUSEA AND VOMITING THAT IS NOT CONTROLLED WITH YOUR NAUSEA MEDICATION  *UNUSUAL SHORTNESS OF BREATH  *UNUSUAL BRUISING OR BLEEDING  TENDERNESS IN MOUTH AND THROAT WITH OR WITHOUT PRESENCE OF ULCERS  *URINARY PROBLEMS  *BOWEL PROBLEMS  UNUSUAL RASH Items with * indicate a potential emergency and should be followed up as soon as possible.  Feel free to call the clinic you have any questions or concerns. The clinic phone number is [phone].  Please show the CHEMO ALERT CARD at check-in to the Emergency Department and triage nurse.   

## 2017-03-12 ENCOUNTER — Telehealth: Payer: Self-pay

## 2017-03-12 NOTE — Telephone Encounter (Signed)
The cause of diarrhea is not from Rituxan.It's from [employer] Take 2 imodium each time, 4 times a day Stay on liquid diet for now until diarrhea < 3X, then increase diet as tolerated

## 2017-03-12 NOTE — Telephone Encounter (Signed)
Called and given below information. Verbalized understanding. Instructed to call with further problems.

## 2017-03-12 NOTE — Telephone Encounter (Signed)
Husband called that pt had tx on Monday of cycle 7 rituxan. She has diarrhea yesterday and today. She has taken imodium. diarrhea 2x yesterday am. She has diarrhea 5x this morning. Taken 4 imodiums today. They are in [place], at daughter's house.  No fever, no nausea.   A local pharmacy is [facility], [place], [address]. [phone].

## 2017-03-13 ENCOUNTER — Encounter (HOSPITAL_COMMUNITY): Payer: Self-pay | Admitting: *Deleted

## 2017-03-13 ENCOUNTER — Emergency Department (HOSPITAL_COMMUNITY)
Admission: EM | Admit: 2017-03-13 | Discharge: 2017-03-13 | Disposition: A | Discharge status: Home / Self Care | Payer: Medicare Other | Attending: Emergency Medicine | Admitting: Emergency Medicine

## 2017-03-13 ENCOUNTER — Telehealth: Payer: Self-pay | Admitting: *Deleted

## 2017-03-13 DIAGNOSIS — Z8673 Personal history of transient ischemic attack (TIA), and cerebral infarction without residual deficits: Secondary | ICD-10-CM | POA: Diagnosis not present

## 2017-03-13 DIAGNOSIS — Z79899 Other long term (current) drug therapy: Secondary | ICD-10-CM | POA: Insufficient documentation

## 2017-03-13 DIAGNOSIS — R197 Diarrhea, unspecified: Secondary | ICD-10-CM | POA: Insufficient documentation

## 2017-03-13 DIAGNOSIS — I10 Essential (primary) hypertension: Secondary | ICD-10-CM | POA: Diagnosis not present

## 2017-03-13 LAB — URINALYSIS, ROUTINE W REFLEX MICROSCOPIC
Bilirubin Urine: NEGATIVE
Glucose, UA: NEGATIVE mg/dL
Hgb urine dipstick: NEGATIVE
KETONES UR: 5 mg/dL — AB
Nitrite: NEGATIVE
PH: 5 (ref 5.0–8.0)
PROTEIN: NEGATIVE mg/dL
Specific Gravity, Urine: 1.005 (ref 1.005–1.030)

## 2017-03-13 LAB — COMPREHENSIVE METABOLIC PANEL
ALBUMIN: 3.5 g/dL (ref 3.5–5.0)
ALT: 71 U/L — ABNORMAL HIGH (ref 14–54)
ANION GAP: 8 (ref 5–15)
AST: 28 U/L (ref 15–41)
Alkaline Phosphatase: 64 U/L (ref 38–126)
BUN: 17 mg/dL (ref 6–20)
CO2: 23 mmol/L (ref 22–32)
Calcium: 9 mg/dL (ref 8.9–10.3)
Chloride: 99 mmol/L — ABNORMAL LOW (ref 101–111)
Creatinine, Ser: 0.79 mg/dL (ref 0.44–1.00)
GFR calc non Af Amer: >60 mL/min (ref >60)
GLUCOSE: 103 mg/dL — AB (ref 65–99)
POTASSIUM: 3.8 mmol/L (ref 3.5–5.1)
SODIUM: 130 mmol/L — AB (ref 135–145)
Total Bilirubin: 1.1 mg/dL (ref 0.3–1.2)
Total Protein: 5.7 g/dL — ABNORMAL LOW (ref 6.5–8.1)

## 2017-03-13 LAB — CBC
HEMATOCRIT: 36.5 % (ref 36.0–46.0)
Hemoglobin: 12.4 g/dL (ref 12.0–15.0)
MCH: 28.2 pg (ref 26.0–34.0)
MCHC: 34 g/dL (ref 30.0–36.0)
MCV: 83 fL (ref 78.0–100.0)
Platelets: 119 10*3/uL — ABNORMAL LOW (ref 150–400)
RBC: 4.4 MIL/uL (ref 3.87–5.11)
RDW: 13.4 % (ref 11.5–15.5)
WBC: 4.5 10*3/uL (ref 4.0–10.5)

## 2017-03-13 LAB — LIPASE, BLOOD: LIPASE: 18 U/L (ref 11–51)

## 2017-03-13 MED ORDER — SODIUM CHLORIDE 0.9 % IV BOLUS (SEPSIS)
1000.0000 mL | Freq: Once | INTRAVENOUS | Status: AC
  Administered 2017-03-13: 1000 mL via INTRAVENOUS

## 2017-03-13 MED ORDER — HEPARIN SOD (PORK) LOCK FLUSH 100 UNIT/ML IV SOLN
500.0000 [IU] | Freq: Once | INTRAVENOUS | Status: AC
  Administered 2017-03-13: 500 [IU]
  Filled 2017-03-13: qty 5

## 2017-03-13 NOTE — ED Notes (Signed)
Pt given sprite to drink. 

## 2017-03-13 NOTE — Telephone Encounter (Signed)
I have over 25+ patients including hospital patients today I cannot work her in today, I apologize I recommend she contact her PCP office to see if she can be evaluated there or urgent care

## 2017-03-13 NOTE — Telephone Encounter (Signed)
Called patient back and given below message. Patient states that she feels so bad that she is going to ER after she takes a shower.

## 2017-03-13 NOTE — ED Notes (Signed)
Patient would like her port accessed.

## 2017-03-13 NOTE — Telephone Encounter (Signed)
Pt's husband called stating continued diarrhea post onset yesterday - " we are back in town now and I think she needs to see Dr Alvy Bimler ".  Mr Brockway states they were on the way to [place] and got as far as [place] when diarrhea occurred - " so we turned around and came home ".  Mr Sachdeva states Tammi Klippel took the imodium as recommended per their call yesterday. Bobbi had 6 watery stools yesterday and since midnight has had 2 with last stool approximately 1.5 hours ago.  Pt is not able to hydrate well due to " when ever she drinks anything she immediately has the diarrhea "  Return call number given as [phone].  Note chemo room has no availabilities per this RN contacted them-  This note will be sent to MD and covering nurse for recommendations and return call to Mr Jaster.

## 2017-03-13 NOTE — ED Provider Notes (Signed)
[facility] DEPT Provider Note   CSN: [id number] Arrival date & time: 03/13/17  1214     History   Chief Complaint Chief Complaint  Patient presents with  . Emesis  . Diarrhea    HPI Angelica Sullivan is a 83 y.o. female.  HPI Patient presents with nausea vomiting diarrhea. Primarily diarrhea. Began Tuesday. States everything she eats goes straight through her in the diarrhea. States she feels little weak. Has been on by mouth chemotherapy for CLL but had not tolerated it well. Had IV chemotherapy on Monday. States she normally does not feel bad with chemotherapy. No fevers. No sick contacts. No recent antibiotics. She states she only had one episode of vomiting and that was yesterday.   Past Medical History:  Diagnosis Date  . Confusion 07/03/2015  . GERD (gastroesophageal reflux disease)   . Hyperlipidemia   . Hypertension   . Hypogammaglobulinemia, acquired ([place]) 02/05/2016  . Irritable bowel   . Leukemia ([place]) 08/06/09   CLL  . Leukemia, chronic lymphoid ([place]) 09/15/2011  . Leukopenia 01/16/2014  . Osteoporosis   . Rhinitis, chronic 09/15/2011  . Thrombocytopenia ([employer]) 09/15/2011  . TIA (transient ischemic attack) 07/05/2015    Patient Active Problem List   Diagnosis Date Noted  . Elevated liver enzymes 03/09/2017  . Diarrhea 03/09/2017  . Essential hypertension 01/12/2017  . Dysuria 12/01/2016  . Parotid gland enlargement 12/01/2016  . Goals of care, counseling/discussion 11/04/2016  . Hyperuricemia 11/04/2016  . Acute recurrent sinusitis 08/25/2016  . Skin rash 04/07/2016  . Bilateral cataracts 04/07/2016  . Hypogammaglobulinemia, acquired ([place]) 02/05/2016  . Upper respiratory tract infection 11/27/2015  . Chronic fatigue 09/13/2015  . Seizure disorder ([place]) 09/13/2015  . Osteoarthritis of both hands 09/13/2015  . TIA (transient ischemic attack) 07/05/2015  . Hypoglycemia 07/05/2015  . Confusion 07/03/2015  . Easy bruising 05/30/2015  . Neutropenia ([place])  04/24/2015  . Lung infiltrate on CT 11/01/2014  . Hypotension 05/02/2014  . Skin lesion of face 03/30/2014  . Back pain 03/30/2014  . Leukopenia 01/16/2014  . CLL (chronic lymphocytic leukemia) ([place]) 11/08/2013  . Anemia, unspecified 08/26/2013  . Abnormal chest x-ray 08/17/2012  . Rhinitis, chronic 09/15/2011  . Thrombocytopenia ([place]) 09/15/2011  . NONSPECIFIC ABN FINDING RAD & OTH EXAM GI TRACT 04/09/2010  . GERD 02/12/2010  . IRRITABLE BOWEL SYNDROME 02/12/2010  . FECAL OCCULT BLOOD 02/12/2010    Past Surgical History:  Procedure Laterality Date  . ANTERIOR AND POSTERIOR VAGINAL REPAIR    . ANTERIOR AND POSTERIOR VAGINAL REPAIR     repeat  . broncoscopy    . HEMORRHOID SURGERY    . LAPAROSCOPIC CHOLECYSTECTOMY    . TONSILLECTOMY AND ADENOIDECTOMY    . VAGINAL HYSTERECTOMY      OB History    No data available       Home Medications    Prior to Admission medications   Medication Sig Start Date End Date Taking? Authorizing Provider  acyclovir (ZOVIRAX) 400 MG tablet Take 1 tablet (400 mg total) by mouth daily. 01/12/17  Yes Gorsuch, Ni, MD  atorvastatin (LIPITOR) 20 MG tablet Take 20 mg by mouth every evening.    Yes [provider]  calcium citrate-vitamin D (CITRACAL+D) 315-200 MG-UNIT per tablet Take 1 tablet by mouth daily.    Yes [provider]  cholecalciferol (VITAMIN D) 1000 units tablet Take 1,000 Units by mouth daily.   Yes [provider]  diphenhydrAMINE (BENADRYL) 25 mg capsule Take 50 mg by mouth every  8 (eight) hours as needed for itching or allergies.    Yes [provider]  ESTRACE VAGINAL 0.1 MG/GM vaginal cream Place 1 Applicatorful vaginally 2 (two) times a week.  10/24/13  Yes [provider]  folic acid (FOLVITE) 1 MG tablet Take 1 mg by mouth daily.   Yes [provider]  glucosamine-chondroitin 500-400 MG tablet Take 1 tablet by mouth daily.     Yes [provider]  idelalisib  (ZYDELIG) 150 MG tablet Take 1 tablet (150 mg total) by mouth 2 (two) times daily. 11/17/16  Yes Gorsuch, Ni, MD  levETIRAcetam (KEPPRA XR) 500 MG 24 hr tablet TAKE 2 TABLETS BY MOUTH  DAILY Patient taking differently: TAKE 2 TABLETS BY MOUTH  at night 02/12/17  Yes Tat, Eustace Quail, DO  lidocaine-prilocaine (EMLA) cream Apply 1 application topically as needed. Patient taking differently: Apply 1 application topically as needed (for port access).  11/04/16  Yes Gorsuch, Ni, MD  loratadine (CLARITIN) 10 MG tablet Take 10 mg by mouth daily.     Yes [provider]  montelukast (SINGULAIR) 10 MG tablet Take 10 mg by mouth daily.    Yes [provider]  Multiple Vitamin (MULTIVITAMIN) tablet Take 1 tablet by mouth daily.     Yes [provider]  mupirocin ointment (BACTROBAN) 2 % Place 1 application into the nose 2 (two) times daily. Patient taking differently: Place 1 application into the nose as needed (for cuts on skin).  02/09/17  Yes Gorsuch, Ni, MD  ondansetron (ZOFRAN) 8 MG tablet Take 1 tablet (8 mg total) by mouth every 8 (eight) hours as needed for nausea. 12/01/16  Yes Gorsuch, Ni, MD  polyvinyl alcohol (ARTIFICIAL TEARS) 1.4 % ophthalmic solution Place 1 drop into both eyes 3 (three) times daily. Reported on 03/10/2016   Yes [provider]  sodium chloride (OCEAN) 0.65 % SOLN nasal spray Place 1 spray into both nostrils 2 (two) times daily.    Yes [provider]    Family History Family History  Problem Relation Age of Onset  . Pneumonia Father     Social History Social History  Substance Use Topics  . Smoking status: Never Smoker  . Smokeless tobacco: Never Used  . Alcohol use No     Allergies   Bactrim [sulfamethoxazole w/trimethoprim (co-trimoxazole)]   Review of Systems Review of Systems  Constitutional: Negative for appetite change, fever and unexpected weight change.  HENT: Negative for congestion.   Respiratory: Negative for  shortness of breath.   Cardiovascular: Negative for chest pain.  Gastrointestinal: Positive for diarrhea, nausea and vomiting. Negative for abdominal pain.  Genitourinary: Negative for flank pain.  Musculoskeletal: Negative for back pain.  Skin: Negative for wound.  Neurological: Negative for seizures.  Hematological: Negative for adenopathy.  Psychiatric/Behavioral: Negative for confusion.     Physical Exam Updated Vital Signs BP (!) 115/59   Pulse 91   Temp 97.5 F (36.4 C) (Oral)   Resp 14   Ht 5' (1.524 m)   SpO2 98%   Physical Exam  Constitutional: She appears well-developed.  HENT:  Head: Atraumatic.  Eyes: EOM are normal.  Neck: Neck supple.  Cardiovascular: Normal rate.   Pulmonary/Chest: Effort normal.  Port-A-Cath to right chest wall  Abdominal: Soft. There is no tenderness.  Musculoskeletal: She exhibits no edema.  Neurological: She is alert.  Skin: Skin is warm. Capillary refill takes less than 2 seconds.  Psychiatric: She has a normal mood and affect.  ED Treatments / Results  Labs (all labs ordered are listed, but only abnormal results are displayed) Labs Reviewed  COMPREHENSIVE METABOLIC PANEL - Abnormal; Notable for the following:       Result Value   Sodium 130 (*)    Chloride 99 (*)    Glucose, Bld 103 (*)    Total Protein 5.7 (*)    ALT 71 (*)    All other components within normal limits  CBC - Abnormal; Notable for the following:    Platelets 119 (*)    All other components within normal limits  URINALYSIS, ROUTINE W REFLEX MICROSCOPIC - Abnormal; Notable for the following:    Ketones, ur 5 (*)    Leukocytes, UA TRACE (*)    Bacteria, UA RARE (*)    Squamous Epithelial / LPF 0-5 (*)    All other components within normal limits  LIPASE, BLOOD    EKG  EKG Interpretation None       Radiology No results found.  Procedures Procedures (including critical care time)  Medications Ordered in ED Medications  sodium chloride  0.9 % bolus 1,000 mL (1,000 mLs Intravenous New Bag/Given 03/13/17 1325)     Initial Impression / Assessment and Plan / ED Course  I have reviewed the triage vital signs and the nursing notes.  Pertinent labs & imaging results that were available during my care of the patient were reviewed by me and considered in my medical decision making (see chart for details).     Patient with some nausea but more diarrhea. Labs overall reassuring. Feels better after IV fluids. Will discharge home. No diarrhea here. C. difficile felt less likely but considered. Final Clinical Impressions(s) / ED Diagnoses   Final diagnoses:  Diarrhea, unspecified type    New Prescriptions New Prescriptions   No medications on file     Davonna Belling, MD 03/13/17 1516

## 2017-03-13 NOTE — ED Triage Notes (Signed)
Pt reports she started to feel bad Tuesday after stopping her PO chemo, pt also receives IV chemo once a month, last chemo was Monday.  She states she started to have n/v/d the next day.  Denies having any pain or dizziness.  Pt is A&Ox 4.

## 2017-03-16 ENCOUNTER — Emergency Department (HOSPITAL_COMMUNITY)
Admission: EM | Admit: 2017-03-16 | Discharge: 2017-03-17 | Disposition: A | Discharge status: Home / Self Care | Payer: Medicare Other | Attending: Emergency Medicine | Admitting: Emergency Medicine

## 2017-03-16 ENCOUNTER — Encounter (HOSPITAL_COMMUNITY): Payer: Self-pay | Admitting: Emergency Medicine

## 2017-03-16 ENCOUNTER — Emergency Department (HOSPITAL_COMMUNITY): Payer: Medicare Other

## 2017-03-16 DIAGNOSIS — R197 Diarrhea, unspecified: Secondary | ICD-10-CM | POA: Insufficient documentation

## 2017-03-16 DIAGNOSIS — R112 Nausea with vomiting, unspecified: Secondary | ICD-10-CM | POA: Diagnosis present

## 2017-03-16 DIAGNOSIS — Z79899 Other long term (current) drug therapy: Secondary | ICD-10-CM | POA: Diagnosis not present

## 2017-03-16 DIAGNOSIS — Z8673 Personal history of transient ischemic attack (TIA), and cerebral infarction without residual deficits: Secondary | ICD-10-CM | POA: Diagnosis not present

## 2017-03-16 DIAGNOSIS — I10 Essential (primary) hypertension: Secondary | ICD-10-CM | POA: Diagnosis not present

## 2017-03-16 DIAGNOSIS — E860 Dehydration: Secondary | ICD-10-CM | POA: Insufficient documentation

## 2017-03-16 LAB — COMPREHENSIVE METABOLIC PANEL
ALK PHOS: 62 U/L (ref 38–126)
ALT: 56 U/L — AB (ref 14–54)
AST: 31 U/L (ref 15–41)
Albumin: 3.1 g/dL — ABNORMAL LOW (ref 3.5–5.0)
Anion gap: 8 (ref 5–15)
BUN: 8 mg/dL (ref 6–20)
CALCIUM: 9 mg/dL (ref 8.9–10.3)
CHLORIDE: 102 mmol/L (ref 101–111)
CO2: 23 mmol/L (ref 22–32)
CREATININE: 0.59 mg/dL (ref 0.44–1.00)
GFR calc non Af Amer: >60 mL/min (ref >60)
GLUCOSE: 87 mg/dL (ref 65–99)
Potassium: 3.8 mmol/L (ref 3.5–5.1)
SODIUM: 133 mmol/L — AB (ref 135–145)
Total Bilirubin: 0.7 mg/dL (ref 0.3–1.2)
Total Protein: 5.2 g/dL — ABNORMAL LOW (ref 6.5–8.1)

## 2017-03-16 LAB — CBC
HCT: 36.9 % (ref 36.0–46.0)
Hemoglobin: 12.2 g/dL (ref 12.0–15.0)
MCH: 27.6 pg (ref 26.0–34.0)
MCHC: 33.1 g/dL (ref 30.0–36.0)
MCV: 83.5 fL (ref 78.0–100.0)
PLATELETS: 129 10*3/uL — AB (ref 150–400)
RBC: 4.42 MIL/uL (ref 3.87–5.11)
RDW: 13.3 % (ref 11.5–15.5)
WBC: 5.1 10*3/uL (ref 4.0–10.5)

## 2017-03-16 LAB — AMMONIA: AMMONIA: 20 umol/L (ref 9–35)

## 2017-03-16 LAB — URINALYSIS, ROUTINE W REFLEX MICROSCOPIC
Bilirubin Urine: NEGATIVE
GLUCOSE, UA: NEGATIVE mg/dL
Hgb urine dipstick: NEGATIVE
KETONES UR: NEGATIVE mg/dL
Nitrite: NEGATIVE
PH: 5 (ref 5.0–8.0)
Protein, ur: NEGATIVE mg/dL
Specific Gravity, Urine: 1.009 (ref 1.005–1.030)

## 2017-03-16 LAB — C DIFFICILE QUICK SCREEN W PCR REFLEX
C DIFFICILE (CDIFF) INTERP: NOT DETECTED
C DIFFICILE (CDIFF) TOXIN: NEGATIVE
C DIFFICLE (CDIFF) ANTIGEN: NEGATIVE

## 2017-03-16 LAB — LIPASE, BLOOD: LIPASE: 36 U/L (ref 11–51)

## 2017-03-16 LAB — PROTIME-INR
INR: 1.11
Prothrombin Time: 14.3 seconds (ref 11.4–15.2)

## 2017-03-16 LAB — I-STAT CG4 LACTIC ACID, ED: Lactic Acid, Venous: 0.52 mmol/L (ref 0.5–1.9)

## 2017-03-16 MED ORDER — HEPARIN SOD (PORK) LOCK FLUSH 100 UNIT/ML IV SOLN
500.0000 [IU] | Freq: Once | INTRAVENOUS | Status: AC
  Administered 2017-03-17: 500 [IU]
  Filled 2017-03-16: qty 5

## 2017-03-16 MED ORDER — SODIUM CHLORIDE 0.9 % IV BOLUS (SEPSIS)
1000.0000 mL | Freq: Once | INTRAVENOUS | Status: AC
  Administered 2017-03-16: 1000 mL via INTRAVENOUS

## 2017-03-16 NOTE — ED Notes (Signed)
Patient transported to X-ray 

## 2017-03-16 NOTE — ED Notes (Signed)
ED Provider at bedside. 

## 2017-03-16 NOTE — ED Triage Notes (Signed)
Pt from home with complaints of abdominal pain, nausea and emesis for about 2 weeks. Pt states her PCP would like her checked for C diff and colitis. Pt has hx of cancer and last chemo was 1 week ago Pt states she was here on Friday for the same.

## 2017-03-16 NOTE — ED Provider Notes (Signed)
[facility] DEPT Provider Note   CSN: [id number] Arrival date & time: 03/16/17  1458     History   Chief Complaint Chief Complaint  Patient presents with  . Diarrhea    cancer patient   . Emesis    HPI Angelica Sullivan is a 83 y.o. female.  The history is provided by the patient, the spouse and medical records. No language interpreter was used.  Diarrhea   This is a new problem. The current episode started more than 2 days ago. The problem occurs more than 10 times per day. The problem has not changed since onset.The stool consistency is described as watery. There has been no fever. Associated symptoms include vomiting. Pertinent negatives include no abdominal pain, no chills, no sweats, no headaches, no URI and no cough. She has tried anti-motility drugs for the symptoms. The treatment provided no relief. Past medical history comments: chemotherapy use.    Past Medical History:  Diagnosis Date  . Confusion 07/03/2015  . GERD (gastroesophageal reflux disease)   . Hyperlipidemia   . Hypertension   . Hypogammaglobulinemia, acquired ([place]) 02/05/2016  . Irritable bowel   . Leukemia ([place]) 08/06/09   CLL  . Leukemia, chronic lymphoid ([place]) 09/15/2011  . Leukopenia 01/16/2014  . Osteoporosis   . Rhinitis, chronic 09/15/2011  . Thrombocytopenia ([place]) 09/15/2011  . TIA (transient ischemic attack) 07/05/2015    Patient Active Problem List   Diagnosis Date Noted  . Elevated liver enzymes 03/09/2017  . Diarrhea 03/09/2017  . Essential hypertension 01/12/2017  . Dysuria 12/01/2016  . Parotid gland enlargement 12/01/2016  . Goals of care, counseling/discussion 11/04/2016  . Hyperuricemia 11/04/2016  . Acute recurrent sinusitis 08/25/2016  . Skin rash 04/07/2016  . Bilateral cataracts 04/07/2016  . Hypogammaglobulinemia, acquired ([place]) 02/05/2016  . Upper respiratory tract infection 11/27/2015  . Chronic fatigue 09/13/2015  . Seizure disorder ([place]) 09/13/2015  . Osteoarthritis  of both hands 09/13/2015  . TIA (transient ischemic attack) 07/05/2015  . Hypoglycemia 07/05/2015  . Confusion 07/03/2015  . Easy bruising 05/30/2015  . Neutropenia ([place]) 04/24/2015  . Lung infiltrate on CT 11/01/2014  . Hypotension 05/02/2014  . Skin lesion of face 03/30/2014  . Back pain 03/30/2014  . Leukopenia 01/16/2014  . CLL (chronic lymphocytic leukemia) ([place]) 11/08/2013  . Anemia, unspecified 08/26/2013  . Abnormal chest x-ray 08/17/2012  . Rhinitis, chronic 09/15/2011  . Thrombocytopenia ([place]) 09/15/2011  . NONSPECIFIC ABN FINDING RAD & OTH EXAM GI TRACT 04/09/2010  . GERD 02/12/2010  . IRRITABLE BOWEL SYNDROME 02/12/2010  . FECAL OCCULT BLOOD 02/12/2010    Past Surgical History:  Procedure Laterality Date  . ANTERIOR AND POSTERIOR VAGINAL REPAIR    . ANTERIOR AND POSTERIOR VAGINAL REPAIR     repeat  . broncoscopy    . HEMORRHOID SURGERY    . LAPAROSCOPIC CHOLECYSTECTOMY    . TONSILLECTOMY AND ADENOIDECTOMY    . VAGINAL HYSTERECTOMY      OB History    No data available       Home Medications    Prior to Admission medications   Medication Sig Start Date End Date Taking? Authorizing Provider  acyclovir (ZOVIRAX) 400 MG tablet Take 1 tablet (400 mg total) by mouth daily. 01/12/17   Heath Lark, MD  atorvastatin (LIPITOR) 20 MG tablet Take 20 mg by mouth every evening.     [provider]  calcium citrate-vitamin D (CITRACAL+D) 315-200 MG-UNIT per tablet Take 1 tablet by mouth daily.     [provider]  cholecalciferol (VITAMIN D) 1000 units tablet Take 1,000 Units by mouth daily.    [provider]  diphenhydrAMINE (BENADRYL) 25 mg capsule Take 50 mg by mouth every 8 (eight) hours as needed for itching or allergies.     [provider]  ESTRACE VAGINAL 0.1 MG/GM vaginal cream Place 1 Applicatorful vaginally 2 (two) times a week.  10/24/13   [provider]  folic acid (FOLVITE) 1 MG tablet Take 1 mg by mouth daily.     [provider]  glucosamine-chondroitin 500-400 MG tablet Take 1 tablet by mouth daily.      [provider]  idelalisib (ZYDELIG) 150 MG tablet Take 1 tablet (150 mg total) by mouth 2 (two) times daily. 11/17/16   Heath Lark, MD  levETIRAcetam (KEPPRA XR) 500 MG 24 hr tablet TAKE 2 TABLETS BY MOUTH  DAILY Patient taking differently: TAKE 2 TABLETS BY MOUTH  at night 02/12/17   Tat, Rebecca S, DO  lidocaine-prilocaine (EMLA) cream Apply 1 application topically as needed. Patient taking differently: Apply 1 application topically as needed (for port access).  11/04/16   Heath Lark, MD  loratadine (CLARITIN) 10 MG tablet Take 10 mg by mouth daily.      [provider]  montelukast (SINGULAIR) 10 MG tablet Take 10 mg by mouth daily.     [provider]  Multiple Vitamin (MULTIVITAMIN) tablet Take 1 tablet by mouth daily.      [provider]  mupirocin ointment (BACTROBAN) 2 % Place 1 application into the nose 2 (two) times daily. Patient taking differently: Place 1 application into the nose as needed (for cuts on skin).  02/09/17   Heath Lark, MD  ondansetron (ZOFRAN) 8 MG tablet Take 1 tablet (8 mg total) by mouth every 8 (eight) hours as needed for nausea. 12/01/16   Heath Lark, MD  polyvinyl alcohol (ARTIFICIAL TEARS) 1.4 % ophthalmic solution Place 1 drop into both eyes 3 (three) times daily. Reported on 03/10/2016    [provider]  sodium chloride (OCEAN) 0.65 % SOLN nasal spray Place 1 spray into both nostrils 2 (two) times daily.     [provider]    Family History Family History  Problem Relation Age of Onset  . Pneumonia Father     Social History Social History  Substance Use Topics  . Smoking status: Never Smoker  . Smokeless tobacco: Never Used  . Alcohol use No     Allergies   Bactrim [sulfamethoxazole w/trimethoprim (co-trimoxazole)]   Review of Systems Review of Systems  Constitutional: Positive for  fatigue. Negative for appetite change, chills, diaphoresis and fever.  HENT: Negative for congestion.   Eyes: Negative for visual disturbance.  Respiratory: Negative for cough, chest tightness, shortness of breath and stridor.   Cardiovascular: Negative for chest pain and palpitations.  Gastrointestinal: Positive for diarrhea, nausea and vomiting. Negative for abdominal distention, abdominal pain, blood in stool and constipation.  Genitourinary: Positive for decreased urine volume. Negative for dysuria, flank pain and frequency.  Musculoskeletal: Negative for back pain, neck pain and neck stiffness.  Skin: Negative for rash and wound.  Neurological: Negative for light-headedness, numbness and headaches.  Psychiatric/Behavioral: Negative for agitation.  All other systems reviewed and are negative.    Physical Exam Updated Vital Signs BP (!) 127/51 (BP Location: Right Arm)   Pulse 96   Temp 97.7 F (36.5 C) (Oral)   Resp 16   Ht 5' (1.524 m)   Wt  115 lb (52.2 kg)   SpO2 97%   BMI 22.46 kg/m   Physical Exam  Constitutional: She is oriented to person, place, and time. She appears well-developed and well-nourished. No distress.  HENT:  Head: Normocephalic and atraumatic.  Mouth/Throat: Oropharynx is clear and moist.  Eyes: Conjunctivae and EOM are normal. Pupils are equal, round, and reactive to light.  Neck: Normal range of motion. Neck supple.  Cardiovascular: Normal rate, regular rhythm and intact distal pulses.   No murmur heard. Pulmonary/Chest: Effort normal. No stridor. No respiratory distress. She has no wheezes. She has rales. She exhibits no tenderness.  Abdominal: Soft. There is no tenderness.  Musculoskeletal: She exhibits no edema.  Neurological: She is alert and oriented to person, place, and time. No cranial nerve deficit or sensory deficit. She exhibits normal muscle tone.  Skin: Skin is warm and dry. Capillary refill takes less than 2 seconds. She is not  diaphoretic. No erythema.  Psychiatric: She has a normal mood and affect.  Nursing note and vitals reviewed.    ED Treatments / Results  Labs (all labs ordered are listed, but only abnormal results are displayed) Labs Reviewed  COMPREHENSIVE METABOLIC PANEL - Abnormal; Notable for the following:       Result Value   Sodium 133 (*)    Total Protein 5.2 (*)    Albumin 3.1 (*)    ALT 56 (*)    All other components within normal limits  CBC - Abnormal; Notable for the following:    Platelets 129 (*)    All other components within normal limits  URINALYSIS, ROUTINE W REFLEX MICROSCOPIC - Abnormal; Notable for the following:    Leukocytes, UA MODERATE (*)    Bacteria, UA MANY (*)    Squamous Epithelial / LPF 0-5 (*)    All other components within normal limits  C DIFFICILE QUICK SCREEN W PCR REFLEX  URINE CULTURE  LIPASE, BLOOD  PROTIME-INR  AMMONIA  I-STAT CG4 LACTIC ACID, ED    EKG  EKG Interpretation None       Radiology Dg Chest 2 View  Result Date: 03/16/2017 CLINICAL DATA:  83 year old female with chronic lymphocytic leukemia with cough. Initial encounter. EXAM: CHEST  2 VIEW COMPARISON:  10/28/2013 chest x-ray.  01/06/2017 chest CT. FINDINGS: Chronic lung changes without segmental consolidation noted. Calcified mediastinal lymph nodes. MediPort catheter tip distal superior vena cava. Calcified mildly tortuous aorta. Heart size within normal limits. Degenerative changes upper lumbar spine. No acute osseous abnormality. Degenerative changes cervical spine. IMPRESSION: Chronic lung changes without segmental consolidation. Calcified mediastinal lymph nodes. Calcified tortuous aorta. Electronically Signed   By: Genia Del M.D.   On: 03/16/2017 17:57    Procedures Procedures (including critical care time)  Medications Ordered in ED Medications  sodium chloride 0.9 % bolus 1,000 mL (0 mLs Intravenous Stopped 03/17/17 0027)  heparin lock flush 100 unit/mL (500 Units  Intracatheter Given 03/17/17 0027)     Initial Impression / Assessment and Plan / ED Course  I have reviewed the triage vital signs and the nursing notes.  Pertinent labs & imaging results that were available during my care of the patient were reviewed by me and considered in my medical decision making (see chart for details).     KAYANI RAPAPORT is a 83 y.o. female with a past medical history significant for CLL on chemotherapy, TIA, hypertension, hyperlipidemia, and GERD who presents at the direction of PCP for further workup and management of fatigue, nausea,  vomiting, diarrhea, decreased oral intake, decreased urination, and reported "liver problems". Patient is a coming in by husband she says that her PCP sent her in to get checked out for C. difficile and dehydration. Patient reports that she had her most recent dose of IV chemotherapy 1 week ago today. She says that the nausea vomiting diarrhea she has had his different than her normal chemotherapy symptoms. She says it has been yellow and watery diarrhea. She also says that she has had intermittent nausea and vomiting and severely decreased oral intake. She says that she has an appetite but feels nauseous and has not been able to keep things down. She reports feeling very fatigued but denies syncope or lightheadedness. She denies fevers or chills. She reports that she has had decreased urination but no dysuria. She was told her "liver function has worsened".          History and exam are seen above.   On exam, abdomen is nontender. Patient appears well. Patient's lungs had slight crackles in the bases but no rhonchi. No tachypnea or hypoxia. No focal neurologic deficits.  Based on patient's symptoms, she will have workup to look for C. difficile as this was the primary concern of her PCP and one reason she was sent here. Patient will be given fluids for rehydration and have labs to look for work and dysfunction or dehydration.  Patient's  laboratory testing was grossly reassuring. C. difficile testing was negative. X-ray shows no significant edema.  She reports feeling better after fluids. Patient encouraged to call her oncologist to discuss further diarrhea management. As she does not appear to be clinically dehydrated anymore and does not have any organ dysfunction, feel patient is safe for discharge. Patient was given strict return precautions and family understood this. Patient had no other questions or concerns and patient was discharged in good condition after rehydration for her diarrhea.   Final Clinical Impressions(s) / ED Diagnoses   Final diagnoses:  Dehydration  Diarrhea, unspecified type     Clinical Impression: 1. Dehydration   2. Diarrhea, unspecified type     Disposition: Discharge  Condition: Good  I have discussed the results, Dx and Tx plan with the pt(& family if present). He/she/they expressed understanding and agree(s) with the plan. Discharge instructions discussed at great length. Strict return precautions discussed and pt &/or family have verbalized understanding of the instructions. No further questions at time of discharge.    Discharge Medication List as of 03/16/2017 11:55 PM      Follow Up: Shon Baton, MD [place] [id number] [phone]  Schedule an appointment as soon as possible for a visit    [facility] DEPT [place] [id number] [place]  If symptoms worsen      Christelle Igoe, Gwenyth Allegra, MD 03/17/17 0140

## 2017-03-16 NOTE — Discharge Instructions (Signed)
Please stay hydrated. Please take your Imodium as previously instructed. Please call to schedule appointment with your oncology team in the next several days for follow-up and reassessment. If symptoms change or worsen, please return to the nearest emergency department.

## 2017-03-17 ENCOUNTER — Encounter: Payer: Self-pay | Admitting: Hematology and Oncology

## 2017-03-17 ENCOUNTER — Telehealth: Payer: Self-pay | Admitting: Hematology and Oncology

## 2017-03-17 ENCOUNTER — Other Ambulatory Visit: Payer: Medicare Other

## 2017-03-17 ENCOUNTER — Ambulatory Visit (HOSPITAL_BASED_OUTPATIENT_CLINIC_OR_DEPARTMENT_OTHER): Payer: Medicare Other

## 2017-03-17 ENCOUNTER — Telehealth: Payer: Self-pay | Admitting: *Deleted

## 2017-03-17 ENCOUNTER — Ambulatory Visit (HOSPITAL_BASED_OUTPATIENT_CLINIC_OR_DEPARTMENT_OTHER): Payer: Medicare Other | Admitting: Hematology and Oncology

## 2017-03-17 VITALS — BP 111/38 | HR 79

## 2017-03-17 VITALS — BP 119/41 | HR 86 | Temp 97.9°F | Resp 17 | Ht 60.0 in | Wt 112.5 lb

## 2017-03-17 DIAGNOSIS — E860 Dehydration: Secondary | ICD-10-CM | POA: Diagnosis not present

## 2017-03-17 DIAGNOSIS — C9110 Chronic lymphocytic leukemia of B-cell type not having achieved remission: Secondary | ICD-10-CM

## 2017-03-17 DIAGNOSIS — R197 Diarrhea, unspecified: Secondary | ICD-10-CM | POA: Diagnosis not present

## 2017-03-17 DIAGNOSIS — R300 Dysuria: Secondary | ICD-10-CM

## 2017-03-17 DIAGNOSIS — D801 Nonfamilial hypogammaglobulinemia: Secondary | ICD-10-CM

## 2017-03-17 MED ORDER — PREDNISONE 20 MG PO TABS: 20.0000 mg | ORAL_TABLET | Freq: Every day | ORAL | 0 refills | Status: DC

## 2017-03-17 MED ORDER — HEPARIN SOD (PORK) LOCK FLUSH 100 UNIT/ML IV SOLN
500.0000 [IU] | Freq: Once | INTRAVENOUS | Status: AC | PRN
  Administered 2017-03-17: 500 [IU]
  Filled 2017-03-17: qty 5

## 2017-03-17 MED ORDER — SODIUM CHLORIDE 0.9 % IV SOLN
Freq: Once | INTRAVENOUS | Status: AC
  Administered 2017-03-17: 16:00:00 via INTRAVENOUS

## 2017-03-17 MED ORDER — SODIUM CHLORIDE 0.9% FLUSH
10.0000 mL | INTRAVENOUS | Status: DC | PRN
  Administered 2017-03-17: 10 mL
  Filled 2017-03-17: qty 10

## 2017-03-17 NOTE — Telephone Encounter (Signed)
Called patient she will be here at 1330 for labs and 2:15 to see Dr. Alvy Bimler.

## 2017-03-17 NOTE — Telephone Encounter (Signed)
I can work her in to be seen at 215 pm today Please get her to come in for labs first and see me after

## 2017-03-17 NOTE — Progress Notes (Signed)
[facility] OFFICE PROGRESS NOTE  Patient Care Team: Shon Baton, MD as PCP - General (Internal Medicine) Heath Lark, MD as Consulting Physician (Hematology and Oncology) Tat, Eustace Quail, DO as Consulting Physician (Neurology)  SUMMARY OF ONCOLOGIC HISTORY: Oncology History   CLL stage IV, deletion 13q     CLL (chronic lymphocytic leukemia) ([place])   12/12/2013 Bone Marrow Biopsy    Bone marrow biopsy show significant involvement of CLL      12/13/2013 Procedure    Patient had placement of a port      12/14/2013 Imaging    CT scan of the chest, abdomen and pelvis showed significant lymphadenopathy and splenomegaly      12/20/2013 - 12/22/2013 Hospital Admission    The patient was admitted to the hospital to begin cycle 1 day 1 of chemotherapy due to risk of infusion reaction. She tolerated treatment well without major side effects.      12/20/2013 - 03/13/2014 Chemotherapy    started on cycle 1 of Obinutuzumab, chlorambucil and prednisone. All treatment placed on hold on 03/13/2014 due to persistent pancytopenia.      03/28/2014 Imaging    Repeat imaging study showed excellent response to treatment.      10/25/2014 Imaging    She has persistent mild lymphadenopathy and splenomegaly, improved compared to previous exam      04/23/2015 Imaging    Ct scan showed disease progression      05/09/2015 - 11/26/2015 Chemotherapy    She started taking ibrutinib.      11/26/2015 Adverse Reaction    Treatment  was placed on hold due to neutropenia and recent infection      02/11/2016 - 10/10/2016 Chemotherapy    She is started on monthly IVIG Rx for recurrent infection and acquired panhypogammaglobulinemia      11/17/2016 -  Chemotherapy    The patient is started on Rituximab. Idelisib was started on 2/1      01/06/2017 PET scan    Significant partial treatment response. Persistent mild hypermetabolism within solitary right paratracheal and solitary right hilar lymph nodes,  which both demonstrate decreased metabolism. Otherwise complete metabolic response. Residual mildly enlarged bilateral neck, axillary, retroperitoneal and pelvic lymph nodes have all decreased in size and demonstrate no hypermetabolism. 2. Additional findings include aortic atherosclerosis, three-vessel coronary atherosclerosis and mosaic attenuation in both lungs most commonly due to air trapping from small airways disease      01/12/2017 Adverse Reaction    Idelisib is placed on hold due to neutropenia       INTERVAL HISTORY: Please see below for problem oriented charting. She is seen at the patient's request She has been to the emergency department twice over the last week due to dehydration and diarrhea Stool culture/PCR test for Clostridium difficile is negative She denies fever or chills She had one episode of vomiting last week, resolved She has poor appetite and poor oral intake She denies dysuria, frequency or urgency Chemotherapy has been placed on hold since last week  REVIEW OF SYSTEMS:   Constitutional: Denies fevers, chills or abnormal weight loss Eyes: Denies blurriness of vision Ears, nose, mouth, throat, and face: Denies mucositis or sore throat Respiratory: Denies cough, dyspnea or wheezes Cardiovascular: Denies palpitation, chest discomfort or lower extremity swelling Skin: Denies abnormal skin rashes Lymphatics: Denies new lymphadenopathy or easy bruising Neurological:Denies numbness, tingling or new weaknesses Behavioral/Psych: Mood is stable, no new changes  All other systems were reviewed with the patient and are negative.  I have reviewed the past medical history, past surgical history, social history and family history with the patient and they are unchanged from previous note.  ALLERGIES:  is allergic to bactrim [sulfamethoxazole w/trimethoprim (co-trimoxazole)].  MEDICATIONS:  Current Outpatient Prescriptions  Medication Sig Dispense Refill  .  acyclovir (ZOVIRAX) 400 MG tablet Take 1 tablet (400 mg total) by mouth daily. 90 tablet 3  . calcium citrate-vitamin D (CITRACAL+D) 315-200 MG-UNIT per tablet Take 1 tablet by mouth daily.     . cholecalciferol (VITAMIN D) 1000 units tablet Take 1,000 Units by mouth daily.    . diphenhydrAMINE (BENADRYL) 25 mg capsule Take 50 mg by mouth every 8 (eight) hours as needed for itching or allergies.     Marland Kitchen ESTRACE VAGINAL 0.1 MG/GM vaginal cream Place 1 Applicatorful vaginally 2 (two) times a week.     . folic acid (FOLVITE) 1 MG tablet Take 1 mg by mouth daily.    Marland Kitchen glucosamine-chondroitin 500-400 MG tablet Take 1 tablet by mouth daily.      Marland Kitchen levETIRAcetam (KEPPRA XR) 500 MG 24 hr tablet TAKE 2 TABLETS BY MOUTH  DAILY (Patient taking differently: TAKE 2 TABLETS BY MOUTH  at night) 180 tablet 1  . lidocaine-prilocaine (EMLA) cream Apply 1 application topically as needed. (Patient taking differently: Apply 1 application topically as needed (for port access). ) 30 g 6  . loratadine (CLARITIN) 10 MG tablet Take 10 mg by mouth daily.      . montelukast (SINGULAIR) 10 MG tablet Take 10 mg by mouth daily.     . Multiple Vitamin (MULTIVITAMIN) tablet Take 1 tablet by mouth daily.      . mupirocin ointment (BACTROBAN) 2 % Place 1 application into the nose 2 (two) times daily. (Patient taking differently: Apply 1 application topically as needed (for cuts on skin). ) 22 g 2  . ondansetron (ZOFRAN) 8 MG tablet Take 1 tablet (8 mg total) by mouth every 8 (eight) hours as needed for nausea. 30 tablet 3  . polyvinyl alcohol (ARTIFICIAL TEARS) 1.4 % ophthalmic solution Place 1 drop into both eyes 3 (three) times daily. Reported on 03/10/2016    . sodium chloride (OCEAN) 0.65 % SOLN nasal spray Place 1 spray into both nostrils 2 (two) times daily.     Marland Kitchen zolpidem (AMBIEN) 10 MG tablet Take 5 mg by mouth as needed for sleep.    Marland Kitchen idelalisib (ZYDELIG) 150 MG tablet Take 1 tablet (150 mg total) by mouth 2 (two) times daily.  (Patient not taking: Reported on 03/17/2017) 60 tablet 11  . predniSONE (DELTASONE) 20 MG tablet Take 1 tablet (20 mg total) by mouth daily with breakfast. 7 tablet 0   No current facility-administered medications for this visit.    Facility-Administered Medications Ordered in Other Visits  Medication Dose Route Frequency Provider Last Rate Last Dose  . sodium chloride flush (NS) 0.9 % injection 10 mL  10 mL Intracatheter PRN Alvy Bimler, Cledis Sohn, MD   10 mL at 03/17/17 1709    PHYSICAL EXAMINATION: ECOG PERFORMANCE STATUS: 1 - Symptomatic but completely ambulatory  Vitals:   03/17/17 1421  BP: (!) 119/41  Pulse: 86  Resp: 17  Temp: 97.9 F (36.6 C)   Filed Weights   03/17/17 1421  Weight: 112 lb 8 oz (51 kg)    GENERAL:alert, no distress and comfortable SKIN: skin color, texture, turgor are normal, no rashes or significant lesions EYES: normal, Conjunctiva are pink and non-injected, sclera clear OROPHARYNX:no exudate, no erythema  and lips, buccal mucosa, and tongue normal  NECK: supple, thyroid normal size, non-tender, without nodularity LYMPH:  no palpable lymphadenopathy in the cervical, axillary or inguinal LUNGS: clear to auscultation and percussion with normal breathing effort HEART: regular rate & rhythm and no murmurs and no lower extremity edema ABDOMEN:abdomen soft, non-tender and normal bowel sounds Musculoskeletal:no cyanosis of digits and no clubbing  NEURO: alert & oriented x 3 with fluent speech, no focal motor/sensory deficits  LABORATORY DATA:  I have reviewed the data as listed    Component Value Date/Time   NA 133 (L) 03/16/2017 1721   NA 133 (L) 03/09/2017 0859   K 3.8 03/16/2017 1721   K 4.5 03/09/2017 0859   CL 102 03/16/2017 1721   CL 99 04/08/2013 0922   CO2 23 03/16/2017 1721   CO2 25 03/09/2017 0859   GLUCOSE 87 03/16/2017 1721   GLUCOSE 98 03/09/2017 0859   GLUCOSE 92 04/08/2013 0922   BUN 8 03/16/2017 1721   BUN 12.5 03/09/2017 0859    CREATININE 0.59 03/16/2017 1721   CREATININE 0.7 03/09/2017 0859   CALCIUM 9.0 03/16/2017 1721   CALCIUM 9.6 03/09/2017 0859   PROT 5.2 (L) 03/16/2017 1721   PROT 6.2 (L) 03/09/2017 0859   ALBUMIN 3.1 (L) 03/16/2017 1721   ALBUMIN 3.9 03/09/2017 0859   AST 31 03/16/2017 1721   AST 63 (H) 03/09/2017 0859   ALT 56 (H) 03/16/2017 1721   ALT 138 (H) 03/09/2017 0859   ALKPHOS 62 03/16/2017 1721   ALKPHOS 61 03/09/2017 0859   BILITOT 0.7 03/16/2017 1721   BILITOT 0.39 03/09/2017 0859   GFRNONAA >60 03/16/2017 1721   GFRAA >60 03/16/2017 1721    No results found for: SPEP, UPEP  Lab Results  Component Value Date   WBC 5.1 03/16/2017   NEUTROABS 3.3 03/09/2017   HGB 12.2 03/16/2017   HCT 36.9 03/16/2017   MCV 83.5 03/16/2017   PLT 129 (L) 03/16/2017      Chemistry      Component Value Date/Time   NA 133 (L) 03/16/2017 1721   NA 133 (L) 03/09/2017 0859   K 3.8 03/16/2017 1721   K 4.5 03/09/2017 0859   CL 102 03/16/2017 1721   CL 99 04/08/2013 0922   CO2 23 03/16/2017 1721   CO2 25 03/09/2017 0859   BUN 8 03/16/2017 1721   BUN 12.5 03/09/2017 0859   CREATININE 0.59 03/16/2017 1721   CREATININE 0.7 03/09/2017 0859      Component Value Date/Time   CALCIUM 9.0 03/16/2017 1721   CALCIUM 9.6 03/09/2017 0859   ALKPHOS 62 03/16/2017 1721   ALKPHOS 61 03/09/2017 0859   AST 31 03/16/2017 1721   AST 63 (H) 03/09/2017 0859   ALT 56 (H) 03/16/2017 1721   ALT 138 (H) 03/09/2017 0859   BILITOT 0.7 03/16/2017 1721   BILITOT 0.39 03/09/2017 0859       RADIOGRAPHIC STUDIES: I have personally reviewed the radiological images as listed and agreed with the findings in the report. Dg Chest 2 View  Result Date: 03/16/2017 CLINICAL DATA:  83 year old female with chronic lymphocytic leukemia with cough. Initial encounter. EXAM: CHEST  2 VIEW COMPARISON:  10/28/2013 chest x-ray.  01/06/2017 chest CT. FINDINGS: Chronic lung changes without segmental consolidation noted. Calcified  mediastinal lymph nodes. MediPort catheter tip distal superior vena cava. Calcified mildly tortuous aorta. Heart size within normal limits. Degenerative changes upper lumbar spine. No acute osseous abnormality. Degenerative changes cervical spine. IMPRESSION: Chronic  lung changes without segmental consolidation. Calcified mediastinal lymph nodes. Calcified tortuous aorta. Electronically Signed   By: Genia Del M.D.   On: 03/16/2017 17:57    ASSESSMENT & PLAN:  CLL (chronic lymphocytic leukemia) Recent PET CT scan show significant disease control with greater than 50% response rate on rituximab alone. She has started on Zydelig. Due to recent profound diarrhea, Zydelig was placed on hold I do not recommend she start chemotherapy until I see her back next month For now, I recommend short-term prednisone course We will treat her CLL and give her some energy and appetite.  She agreed to try  Dehydration She has profound dehydration due to recent diarrhea I recommend daily IV fluids for supportive care I did offer to admit her but the patient declined admission for now  Diarrhea She has recent diarrhea of unknown etiology By history, it appears it could be due to viral gastroenteritis Her chemotherapy has been placed on hold since last week For now, I recommend Imodium as needed  Dysuria Urinalysis drawn yesterday was abnormal She is not symptomatic Awaiting urine culture   Orders Placed This Encounter  Procedures  . Urinalysis, Microscopic - [facility]    Standing Status:   Future    Standing Expiration Date:   04/21/2018   All questions were answered. The patient knows to call the clinic with any problems, questions or concerns. No barriers to learning was detected. I spent 20 minutes counseling the patient face to face. The total time spent in the appointment was 25 minutes and more than 50% was on counseling and review of test results     Heath Lark, MD 03/17/2017 6:01 PM

## 2017-03-17 NOTE — Assessment & Plan Note (Signed)
She has profound dehydration due to recent diarrhea I recommend daily IV fluids for supportive care I did offer to admit her but the patient declined admission for now

## 2017-03-17 NOTE — Assessment & Plan Note (Signed)
Urinalysis drawn yesterday was abnormal She is not symptomatic Awaiting urine culture

## 2017-03-17 NOTE — Telephone Encounter (Signed)
Appointments scheduled per 03/17/17 los. IVFs added per Ubaldo Glassing and [employer]. (verbal). Lab added for today. Patient was given a copy of the AVS report and appointment schedule per 03/17/17 los.

## 2017-03-17 NOTE — Telephone Encounter (Signed)
Husband of patient called stating that PCP/ED visit was initiated due to constant diarrhea. PCP stated there was nothing that they could do on their end. Last visit to the ED was 03/16/17 and they were told to get back in touch with MD Alvy Bimler and continue imodium. C.Diff check was negative. Last loose stool was 10 minutes ago and patient too last imodium at 2 am this morning.

## 2017-03-17 NOTE — Assessment & Plan Note (Signed)
She has recent diarrhea of unknown etiology By history, it appears it could be due to viral gastroenteritis Her chemotherapy has been placed on hold since last week For now, I recommend Imodium as needed

## 2017-03-17 NOTE — Assessment & Plan Note (Addendum)
Recent PET CT scan show significant disease control with greater than 50% response rate on rituximab alone. She has started on Zydelig. Due to recent profound diarrhea, Zydelig was placed on hold I do not recommend she start chemotherapy until I see her back next month For now, I recommend short-term prednisone course We will treat her CLL and give her some energy and appetite.  She agreed to try

## 2017-03-17 NOTE — Patient Instructions (Signed)
Dehydration, Adult Dehydration is a condition in which there is not enough fluid or water in the body. This happens when you lose more fluids than you take in. Important organs, such as the kidneys, brain, and heart, cannot function without a proper amount of fluids. Any loss of fluids from the body can lead to dehydration. Dehydration can range from mild to severe. This condition should be treated right away to prevent it from becoming severe. What are the causes? This condition may be caused by:  Vomiting.  Diarrhea.  Excessive sweating, such as from heat exposure or exercise.  Not drinking enough fluid, especially:  When ill.  While doing activity that requires a lot of energy.  Excessive urination.  Fever.  Infection.  Certain medicines, such as medicines that cause the body to lose excess fluid (diuretics).  Inability to access safe drinking water.  Reduced physical ability to get adequate water and food. What increases the risk? This condition is more likely to develop in people:  Who have a poorly controlled long-term (chronic) illness, such as diabetes, heart disease, or kidney disease.  Who are age 65 or older.  Who are disabled.  Who live in a place with high altitude.  Who play endurance sports. What are the signs or symptoms? Symptoms of mild dehydration may include:   Thirst.  Dry lips.  Slightly dry mouth.  Dry, warm skin.  Dizziness. Symptoms of moderate dehydration may include:   Very dry mouth.  Muscle cramps.  Dark urine. Urine may be the color of tea.  Decreased urine production.  Decreased tear production.  Heartbeat that is irregular or faster than normal (palpitations).  Headache.  Light-headedness, especially when you stand up from a sitting position.  Fainting (syncope). Symptoms of severe dehydration may include:   Changes in skin, such as:  Cold and clammy skin.  Blotchy (mottled) or pale skin.  Skin that does  not quickly return to normal after being lightly pinched and released (poor skin turgor).  Changes in body fluids, such as:  Extreme thirst.  No tear production.  Inability to sweat when body temperature is high, such as in hot weather.  Very little urine production.  Changes in vital signs, such as:  Weak pulse.  Pulse that is more than 100 beats a minute when sitting still.  Rapid breathing.  Low blood pressure.  Other changes, such as:  Sunken eyes.  Cold hands and feet.  Confusion.  Lack of energy (lethargy).  Difficulty waking up from sleep.  Short-term weight loss.  Unconsciousness. How is this diagnosed? This condition is diagnosed based on your symptoms and a physical exam. Blood and urine tests may be done to help confirm the diagnosis. How is this treated? Treatment for this condition depends on the severity. Mild or moderate dehydration can often be treated at home. Treatment should be started right away. Do not wait until dehydration becomes severe. Severe dehydration is an emergency and it needs to be treated in a hospital. Treatment for mild dehydration may include:   Drinking more fluids.  Replacing salts and minerals in your blood (electrolytes) that you may have lost. Treatment for moderate dehydration may include:   Drinking an oral rehydration solution (ORS). This is a drink that helps you replace fluids and electrolytes (rehydrate). It can be found at pharmacies and retail stores. Treatment for severe dehydration may include:   Receiving fluids through an IV tube.  Receiving an electrolyte solution through a feeding tube that is   passed through your nose and into your stomach (nasogastric tube, or NG tube).  Correcting any abnormalities in electrolytes.  Treating the underlying cause of dehydration. Follow these instructions at home:  If directed by your health care provider, drink an ORS:  Make an ORS by following instructions on the  package.  Start by drinking small amounts, about  cup (120 mL) every 5-10 minutes.  Slowly increase how much you drink until you have taken the amount recommended by your health care provider.  Drink enough clear fluid to keep your urine clear or pale yellow. If you were told to drink an ORS, finish the ORS first, then start slowly drinking other clear fluids. Drink fluids such as:  Water. Do not drink only water. Doing that can lead to having too little salt (sodium) in the body (hyponatremia).  Ice chips.  Fruit juice that you have added water to (diluted fruit juice).  Low-calorie sports drinks.  Avoid:  Alcohol.  Drinks that contain a lot of sugar. These include high-calorie sports drinks, fruit juice that is not diluted, and soda.  Caffeine.  Foods that are greasy or contain a lot of fat or sugar.  Take over-the-counter and prescription medicines only as told by your health care provider.  Do not take sodium tablets. This can lead to having too much sodium in the body (hypernatremia).  Eat foods that contain a healthy balance of electrolytes, such as bananas, oranges, potatoes, tomatoes, and spinach.  Keep all follow-up visits as told by your health care provider. This is important. Contact a health care provider if:  You have abdominal pain that:  Gets worse.  Stays in one area (localizes).  You have a rash.  You have a stiff neck.  You are more irritable than usual.  You are sleepier or more difficult to wake up than usual.  You feel weak or dizzy.  You feel very thirsty.  You have urinated only a small amount of very dark urine over 6-8 hours. Get help right away if:  You have symptoms of severe dehydration.  You cannot drink fluids without vomiting.  Your symptoms get worse with treatment.  You have a fever.  You have a severe headache.  You have vomiting or diarrhea that:  Gets worse.  Does not go away.  You have blood or green matter  (bile) in your vomit.  You have blood in your stool. This may cause stool to look black and tarry.  You have not urinated in 6-8 hours.  You faint.  Your heart rate while sitting still is over 100 beats a minute.  You have trouble breathing. This information is not intended to replace advice given to you by your health care provider. Make sure you discuss any questions you have with your health care provider. Document Released: 10/20/2005 Document Revised: 05/16/2016 Document Reviewed: 12/14/2015 Elsevier Interactive Patient Education  2017 [employer]  

## 2017-03-18 ENCOUNTER — Other Ambulatory Visit: Payer: Self-pay | Admitting: Hematology and Oncology

## 2017-03-18 ENCOUNTER — Ambulatory Visit (HOSPITAL_BASED_OUTPATIENT_CLINIC_OR_DEPARTMENT_OTHER): Payer: Medicare Other

## 2017-03-18 ENCOUNTER — Telehealth: Payer: Self-pay | Admitting: Hematology and Oncology

## 2017-03-18 ENCOUNTER — Ambulatory Visit: Payer: Medicare Other

## 2017-03-18 ENCOUNTER — Other Ambulatory Visit: Payer: Self-pay | Admitting: *Deleted

## 2017-03-18 VITALS — BP 114/57 | HR 76 | Temp 97.8°F | Resp 16

## 2017-03-18 DIAGNOSIS — R300 Dysuria: Secondary | ICD-10-CM

## 2017-03-18 DIAGNOSIS — C9110 Chronic lymphocytic leukemia of B-cell type not having achieved remission: Secondary | ICD-10-CM

## 2017-03-18 DIAGNOSIS — D801 Nonfamilial hypogammaglobulinemia: Secondary | ICD-10-CM

## 2017-03-18 DIAGNOSIS — E860 Dehydration: Secondary | ICD-10-CM | POA: Diagnosis not present

## 2017-03-18 DIAGNOSIS — N3000 Acute cystitis without hematuria: Secondary | ICD-10-CM

## 2017-03-18 DIAGNOSIS — N390 Urinary tract infection, site not specified: Secondary | ICD-10-CM | POA: Insufficient documentation

## 2017-03-18 LAB — URINALYSIS, MICROSCOPIC - CHCC
BILIRUBIN (URINE): NEGATIVE
Blood: NEGATIVE
GLUCOSE UR CHCC: NEGATIVE mg/dL
KETONES: 5 mg/dL
NITRITE: NEGATIVE
Specific Gravity, Urine: 1.01 (ref 1.003–1.035)
Urobilinogen, UR: 0.2 mg/dL (ref 0.2–1)
pH: 6 (ref 4.6–8.0)

## 2017-03-18 LAB — URINE CULTURE: Culture: >=100000 — AB

## 2017-03-18 MED ORDER — NITROFURANTOIN MACROCRYSTAL 100 MG PO CAPS: 100.0000 mg | ORAL_CAPSULE | Freq: Two times a day (BID) | ORAL | 0 refills | Status: DC

## 2017-03-18 MED ORDER — SODIUM CHLORIDE 0.9% FLUSH
10.0000 mL | INTRAVENOUS | Status: DC | PRN
  Administered 2017-03-18: 10 mL
  Filled 2017-03-18: qty 10

## 2017-03-18 MED ORDER — SODIUM CHLORIDE 0.9 % IV SOLN
Freq: Once | INTRAVENOUS | Status: AC
  Administered 2017-03-18: 10:00:00 via INTRAVENOUS

## 2017-03-18 MED ORDER — HEPARIN SOD (PORK) LOCK FLUSH 100 UNIT/ML IV SOLN
500.0000 [IU] | Freq: Once | INTRAVENOUS | Status: AC | PRN
  Administered 2017-03-18: 500 [IU]
  Filled 2017-03-18: qty 5

## 2017-03-18 NOTE — Telephone Encounter (Signed)
Urine culture from ER is positive Staff nurse is informed to relay message to patient to start antibiotics (will e-scribe to local pharmacy)

## 2017-03-18 NOTE — Patient Instructions (Signed)
Dehydration, Adult Dehydration is a condition in which there is not enough fluid or water in the body. This happens when you lose more fluids than you take in. Important organs, such as the kidneys, brain, and heart, cannot function without a proper amount of fluids. Any loss of fluids from the body can lead to dehydration. Dehydration can range from mild to severe. This condition should be treated right away to prevent it from becoming severe. What are the causes? This condition may be caused by:  Vomiting.  Diarrhea.  Excessive sweating, such as from heat exposure or exercise.  Not drinking enough fluid, especially:  When ill.  While doing activity that requires a lot of energy.  Excessive urination.  Fever.  Infection.  Certain medicines, such as medicines that cause the body to lose excess fluid (diuretics).  Inability to access safe drinking water.  Reduced physical ability to get adequate water and food. What increases the risk? This condition is more likely to develop in people:  Who have a poorly controlled long-term (chronic) illness, such as diabetes, heart disease, or kidney disease.  Who are age 65 or older.  Who are disabled.  Who live in a place with high altitude.  Who play endurance sports. What are the signs or symptoms? Symptoms of mild dehydration may include:   Thirst.  Dry lips.  Slightly dry mouth.  Dry, warm skin.  Dizziness. Symptoms of moderate dehydration may include:   Very dry mouth.  Muscle cramps.  Dark urine. Urine may be the color of tea.  Decreased urine production.  Decreased tear production.  Heartbeat that is irregular or faster than normal (palpitations).  Headache.  Light-headedness, especially when you stand up from a sitting position.  Fainting (syncope). Symptoms of severe dehydration may include:   Changes in skin, such as:  Cold and clammy skin.  Blotchy (mottled) or pale skin.  Skin that does  not quickly return to normal after being lightly pinched and released (poor skin turgor).  Changes in body fluids, such as:  Extreme thirst.  No tear production.  Inability to sweat when body temperature is high, such as in hot weather.  Very little urine production.  Changes in vital signs, such as:  Weak pulse.  Pulse that is more than 100 beats a minute when sitting still.  Rapid breathing.  Low blood pressure.  Other changes, such as:  Sunken eyes.  Cold hands and feet.  Confusion.  Lack of energy (lethargy).  Difficulty waking up from sleep.  Short-term weight loss.  Unconsciousness. How is this diagnosed? This condition is diagnosed based on your symptoms and a physical exam. Blood and urine tests may be done to help confirm the diagnosis. How is this treated? Treatment for this condition depends on the severity. Mild or moderate dehydration can often be treated at home. Treatment should be started right away. Do not wait until dehydration becomes severe. Severe dehydration is an emergency and it needs to be treated in a hospital. Treatment for mild dehydration may include:   Drinking more fluids.  Replacing salts and minerals in your blood (electrolytes) that you may have lost. Treatment for moderate dehydration may include:   Drinking an oral rehydration solution (ORS). This is a drink that helps you replace fluids and electrolytes (rehydrate). It can be found at pharmacies and retail stores. Treatment for severe dehydration may include:   Receiving fluids through an IV tube.  Receiving an electrolyte solution through a feeding tube that is   passed through your nose and into your stomach (nasogastric tube, or NG tube).  Correcting any abnormalities in electrolytes.  Treating the underlying cause of dehydration. Follow these instructions at home:  If directed by your health care provider, drink an ORS:  Make an ORS by following instructions on the  package.  Start by drinking small amounts, about  cup (120 mL) every 5-10 minutes.  Slowly increase how much you drink until you have taken the amount recommended by your health care provider.  Drink enough clear fluid to keep your urine clear or pale yellow. If you were told to drink an ORS, finish the ORS first, then start slowly drinking other clear fluids. Drink fluids such as:  Water. Do not drink only water. Doing that can lead to having too little salt (sodium) in the body (hyponatremia).  Ice chips.  Fruit juice that you have added water to (diluted fruit juice).  Low-calorie sports drinks.  Avoid:  Alcohol.  Drinks that contain a lot of sugar. These include high-calorie sports drinks, fruit juice that is not diluted, and soda.  Caffeine.  Foods that are greasy or contain a lot of fat or sugar.  Take over-the-counter and prescription medicines only as told by your health care provider.  Do not take sodium tablets. This can lead to having too much sodium in the body (hypernatremia).  Eat foods that contain a healthy balance of electrolytes, such as bananas, oranges, potatoes, tomatoes, and spinach.  Keep all follow-up visits as told by your health care provider. This is important. Contact a health care provider if:  You have abdominal pain that:  Gets worse.  Stays in one area (localizes).  You have a rash.  You have a stiff neck.  You are more irritable than usual.  You are sleepier or more difficult to wake up than usual.  You feel weak or dizzy.  You feel very thirsty.  You have urinated only a small amount of very dark urine over 6-8 hours. Get help right away if:  You have symptoms of severe dehydration.  You cannot drink fluids without vomiting.  Your symptoms get worse with treatment.  You have a fever.  You have a severe headache.  You have vomiting or diarrhea that:  Gets worse.  Does not go away.  You have blood or green matter  (bile) in your vomit.  You have blood in your stool. This may cause stool to look black and tarry.  You have not urinated in 6-8 hours.  You faint.  Your heart rate while sitting still is over 100 beats a minute.  You have trouble breathing. This information is not intended to replace advice given to you by your health care provider. Make sure you discuss any questions you have with your health care provider. Document Released: 10/20/2005 Document Revised: 05/16/2016 Document Reviewed: 12/14/2015 Elsevier Interactive Patient Education  2017 [employer]  

## 2017-03-19 ENCOUNTER — Ambulatory Visit (HOSPITAL_BASED_OUTPATIENT_CLINIC_OR_DEPARTMENT_OTHER): Payer: Medicare Other

## 2017-03-19 ENCOUNTER — Other Ambulatory Visit: Payer: Self-pay | Admitting: Hematology and Oncology

## 2017-03-19 VITALS — BP 129/50 | HR 76 | Temp 97.7°F | Resp 18 | Ht 60.0 in | Wt 114.7 lb

## 2017-03-19 DIAGNOSIS — C9110 Chronic lymphocytic leukemia of B-cell type not having achieved remission: Secondary | ICD-10-CM

## 2017-03-19 DIAGNOSIS — D801 Nonfamilial hypogammaglobulinemia: Secondary | ICD-10-CM

## 2017-03-19 DIAGNOSIS — E860 Dehydration: Secondary | ICD-10-CM | POA: Diagnosis not present

## 2017-03-19 MED ORDER — DIPHENOXYLATE-ATROPINE 2.5-0.025 MG PO TABS: 1.0000 | ORAL_TABLET | Freq: Four times a day (QID) | ORAL | 0 refills | Status: DC | PRN

## 2017-03-19 MED ORDER — SODIUM CHLORIDE 0.9 % IV SOLN
Freq: Once | INTRAVENOUS | Status: AC
  Administered 2017-03-19: 12:00:00 via INTRAVENOUS

## 2017-03-19 MED ORDER — SODIUM CHLORIDE 0.9% FLUSH
10.0000 mL | INTRAVENOUS | Status: DC | PRN
  Administered 2017-03-19: 10 mL
  Filled 2017-03-19: qty 10

## 2017-03-19 MED ORDER — HEPARIN SOD (PORK) LOCK FLUSH 100 UNIT/ML IV SOLN
500.0000 [IU] | Freq: Once | INTRAVENOUS | Status: AC | PRN
  Administered 2017-03-19: 500 [IU]
  Filled 2017-03-19: qty 5

## 2017-03-20 ENCOUNTER — Ambulatory Visit: Payer: Medicare Other

## 2017-03-20 NOTE — Patient Instructions (Signed)
Dehydration, Adult Dehydration is a condition in which there is not enough fluid or water in the body. This happens when you lose more fluids than you take in. Important organs, such as the kidneys, brain, and heart, cannot function without a proper amount of fluids. Any loss of fluids from the body can lead to dehydration. Dehydration can range from mild to severe. This condition should be treated right away to prevent it from becoming severe. What are the causes? This condition may be caused by:  Vomiting.  Diarrhea.  Excessive sweating, such as from heat exposure or exercise.  Not drinking enough fluid, especially:  When ill.  While doing activity that requires a lot of energy.  Excessive urination.  Fever.  Infection.  Certain medicines, such as medicines that cause the body to lose excess fluid (diuretics).  Inability to access safe drinking water.  Reduced physical ability to get adequate water and food. What increases the risk? This condition is more likely to develop in people:  Who have a poorly controlled long-term (chronic) illness, such as diabetes, heart disease, or kidney disease.  Who are age 65 or older.  Who are disabled.  Who live in a place with high altitude.  Who play endurance sports. What are the signs or symptoms? Symptoms of mild dehydration may include:   Thirst.  Dry lips.  Slightly dry mouth.  Dry, warm skin.  Dizziness. Symptoms of moderate dehydration may include:   Very dry mouth.  Muscle cramps.  Dark urine. Urine may be the color of tea.  Decreased urine production.  Decreased tear production.  Heartbeat that is irregular or faster than normal (palpitations).  Headache.  Light-headedness, especially when you stand up from a sitting position.  Fainting (syncope). Symptoms of severe dehydration may include:   Changes in skin, such as:  Cold and clammy skin.  Blotchy (mottled) or pale skin.  Skin that does  not quickly return to normal after being lightly pinched and released (poor skin turgor).  Changes in body fluids, such as:  Extreme thirst.  No tear production.  Inability to sweat when body temperature is high, such as in hot weather.  Very little urine production.  Changes in vital signs, such as:  Weak pulse.  Pulse that is more than 100 beats a minute when sitting still.  Rapid breathing.  Low blood pressure.  Other changes, such as:  Sunken eyes.  Cold hands and feet.  Confusion.  Lack of energy (lethargy).  Difficulty waking up from sleep.  Short-term weight loss.  Unconsciousness. How is this diagnosed? This condition is diagnosed based on your symptoms and a physical exam. Blood and urine tests may be done to help confirm the diagnosis. How is this treated? Treatment for this condition depends on the severity. Mild or moderate dehydration can often be treated at home. Treatment should be started right away. Do not wait until dehydration becomes severe. Severe dehydration is an emergency and it needs to be treated in a hospital. Treatment for mild dehydration may include:   Drinking more fluids.  Replacing salts and minerals in your blood (electrolytes) that you may have lost. Treatment for moderate dehydration may include:   Drinking an oral rehydration solution (ORS). This is a drink that helps you replace fluids and electrolytes (rehydrate). It can be found at pharmacies and retail stores. Treatment for severe dehydration may include:   Receiving fluids through an IV tube.  Receiving an electrolyte solution through a feeding tube that is   passed through your nose and into your stomach (nasogastric tube, or NG tube).  Correcting any abnormalities in electrolytes.  Treating the underlying cause of dehydration. Follow these instructions at home:  If directed by your health care provider, drink an ORS:  Make an ORS by following instructions on the  package.  Start by drinking small amounts, about  cup (120 mL) every 5-10 minutes.  Slowly increase how much you drink until you have taken the amount recommended by your health care provider.  Drink enough clear fluid to keep your urine clear or pale yellow. If you were told to drink an ORS, finish the ORS first, then start slowly drinking other clear fluids. Drink fluids such as:  Water. Do not drink only water. Doing that can lead to having too little salt (sodium) in the body (hyponatremia).  Ice chips.  Fruit juice that you have added water to (diluted fruit juice).  Low-calorie sports drinks.  Avoid:  Alcohol.  Drinks that contain a lot of sugar. These include high-calorie sports drinks, fruit juice that is not diluted, and soda.  Caffeine.  Foods that are greasy or contain a lot of fat or sugar.  Take over-the-counter and prescription medicines only as told by your health care provider.  Do not take sodium tablets. This can lead to having too much sodium in the body (hypernatremia).  Eat foods that contain a healthy balance of electrolytes, such as bananas, oranges, potatoes, tomatoes, and spinach.  Keep all follow-up visits as told by your health care provider. This is important. Contact a health care provider if:  You have abdominal pain that:  Gets worse.  Stays in one area (localizes).  You have a rash.  You have a stiff neck.  You are more irritable than usual.  You are sleepier or more difficult to wake up than usual.  You feel weak or dizzy.  You feel very thirsty.  You have urinated only a small amount of very dark urine over 6-8 hours. Get help right away if:  You have symptoms of severe dehydration.  You cannot drink fluids without vomiting.  Your symptoms get worse with treatment.  You have a fever.  You have a severe headache.  You have vomiting or diarrhea that:  Gets worse.  Does not go away.  You have blood or green matter  (bile) in your vomit.  You have blood in your stool. This may cause stool to look black and tarry.  You have not urinated in 6-8 hours.  You faint.  Your heart rate while sitting still is over 100 beats a minute.  You have trouble breathing. This information is not intended to replace advice given to you by your health care provider. Make sure you discuss any questions you have with your health care provider. Document Released: 10/20/2005 Document Revised: 05/16/2016 Document Reviewed: 12/14/2015 Elsevier Interactive Patient Education  2017 [employer]    Diarrhea, Adult Diarrhea is frequent loose and watery bowel movements. Diarrhea can make you feel weak and cause you to become dehydrated. Dehydration can make you tired and thirsty, cause you to have a dry mouth, and decrease how often you urinate. Diarrhea typically lasts 2-3 days. However, it can last longer if it is a sign of something more serious. It is important to treat your diarrhea as told by your health care provider. Follow these instructions at home: Eating and drinking   Follow these recommendations as told by your health care provider:  Take an  oral rehydration solution (ORS). This is a drink that is sold at pharmacies and retail stores.  Drink clear fluids, such as water, ice chips, diluted fruit juice, and low-calorie sports drinks.  Eat bland, easy-to-digest foods in small amounts as you are able. These foods include bananas, applesauce, rice, lean meats, toast, and crackers.  Avoid drinking fluids that contain a lot of sugar or caffeine, such as energy drinks, sports drinks, and soda.  Avoid alcohol.  Avoid spicy or fatty foods. General instructions   Drink enough fluid to keep your urine clear or pale yellow.  Wash your hands often. If soap and water are not available, use hand sanitizer.  Make sure that all people in your household wash their hands well and often.  Take over-the-counter and  prescription medicines only as told by your health care provider.  Rest at home while you recover.  Watch your condition for any changes.  Take a warm bath to relieve any burning or pain from frequent diarrhea episodes.  Keep all follow-up visits as told by your health care provider. This is important. Contact a health care provider if:  You have a fever.  Your diarrhea gets worse.  You have new symptoms.  You cannot keep fluids down.  You feel light-headed or dizzy.  You have a headache  You have muscle cramps. Get help right away if:  You have chest pain.  You feel extremely weak or you faint.  You have bloody or black stools or stools that look like tar.  You have severe pain, cramping, or bloating in your abdomen.  You have trouble breathing or you are breathing very quickly.  Your heart is beating very quickly.  Your skin feels cold and clammy.  You feel confused.  You have signs of dehydration, such as:  Dark urine, very little urine, or no urine.  Cracked lips.  Dry mouth.  Sunken eyes.  Sleepiness.  Weakness. This information is not intended to replace advice given to you by your health care provider. Make sure you discuss any questions you have with your health care provider. Document Released: 10/10/2002 Document Revised: 02/28/2016 Document Reviewed: 06/26/2015 Elsevier Interactive Patient Education  2017 [employer].

## 2017-03-20 NOTE — Progress Notes (Signed)
Pt arrived for IV fluids today. States she is feeling better. Diarrhea has decreased and is thicker. No loose stools during the night  3-4 this am.  She states she is feeling hungry. Denies nausea. Also noted new ankle and pedal edema today, that was not there yesterday. Discussed with patient increasing po fluids and increasing diet as tolerated at home and not needing IV fluids today. VSS. Denies lightheadedness/dizzyness.   Discussed with Dr. Alvy Bimler. Agreed that pt will do well with home hydration.  Pt and husband in agreement. Pt to call on Monday with an update on her status.  Pt discharged home with instruction for oral hydration and nutrition info.

## 2017-03-22 LAB — URINE CULTURE

## 2017-03-23 ENCOUNTER — Telehealth: Payer: Self-pay

## 2017-03-23 NOTE — Telephone Encounter (Signed)
Dr Alvy Bimler ordered prednisone 20 mg /day for 1 week that ends Tuesday morning. Does she want to order more?   Can leave a message.

## 2017-03-23 NOTE — Telephone Encounter (Signed)
Pt called to update on her diarrhea. Last IVF was Thursday, Friday was cancelled d/t ankle edema No diarrhea since Friday, wt is 110#. A little nausea Thursday but no vomiting, zofran helped. Edema is mostly gone, a little remains in right ankle.   Can she start eating some more food, like lean meats, salad, milk shakes?

## 2017-03-23 NOTE — Telephone Encounter (Signed)
Yes, eat whatever she like Continue prednisone for now

## 2017-03-24 ENCOUNTER — Other Ambulatory Visit: Payer: Self-pay

## 2017-03-24 MED ORDER — PREDNISONE 5 MG PO TABS: 10.0000 mg | ORAL_TABLET | Freq: Every day | ORAL | 0 refills | Status: AC

## 2017-03-24 NOTE — Telephone Encounter (Signed)
pls call in reduced dose prednisone, 10 mg daily PO x 7 days, no refills

## 2017-03-24 NOTE — Telephone Encounter (Signed)
Called with below message. 

## 2017-04-06 ENCOUNTER — Other Ambulatory Visit (HOSPITAL_BASED_OUTPATIENT_CLINIC_OR_DEPARTMENT_OTHER): Payer: Medicare Other

## 2017-04-06 ENCOUNTER — Telehealth: Payer: Self-pay | Admitting: Hematology and Oncology

## 2017-04-06 ENCOUNTER — Ambulatory Visit (HOSPITAL_BASED_OUTPATIENT_CLINIC_OR_DEPARTMENT_OTHER): Payer: Medicare Other | Admitting: Hematology and Oncology

## 2017-04-06 ENCOUNTER — Encounter: Payer: Self-pay | Admitting: Hematology and Oncology

## 2017-04-06 ENCOUNTER — Ambulatory Visit (HOSPITAL_BASED_OUTPATIENT_CLINIC_OR_DEPARTMENT_OTHER): Payer: Medicare Other

## 2017-04-06 ENCOUNTER — Ambulatory Visit: Payer: Medicare Other

## 2017-04-06 DIAGNOSIS — D801 Nonfamilial hypogammaglobulinemia: Secondary | ICD-10-CM

## 2017-04-06 DIAGNOSIS — C9110 Chronic lymphocytic leukemia of B-cell type not having achieved remission: Secondary | ICD-10-CM

## 2017-04-06 DIAGNOSIS — Z5112 Encounter for antineoplastic immunotherapy: Secondary | ICD-10-CM | POA: Diagnosis not present

## 2017-04-06 DIAGNOSIS — D696 Thrombocytopenia, unspecified: Secondary | ICD-10-CM | POA: Diagnosis not present

## 2017-04-06 LAB — COMPREHENSIVE METABOLIC PANEL
ALBUMIN: 3.5 g/dL (ref 3.5–5.0)
ALK PHOS: 61 U/L (ref 40–150)
ALT: 22 U/L (ref 0–55)
ANION GAP: 8 meq/L (ref 3–11)
AST: 19 U/L (ref 5–34)
BILIRUBIN TOTAL: 0.43 mg/dL (ref 0.20–1.20)
BUN: 15.4 mg/dL (ref 7.0–26.0)
CO2: 26 mEq/L (ref 22–29)
Calcium: 9.9 mg/dL (ref 8.4–10.4)
Chloride: 105 mEq/L (ref 98–109)
Creatinine: 0.8 mg/dL (ref 0.6–1.1)
EGFR: 68 mL/min/{1.73_m2} — AB (ref >90)
Glucose: 131 mg/dl (ref 70–140)
POTASSIUM: 3.9 meq/L (ref 3.5–5.1)
Sodium: 139 mEq/L (ref 136–145)
TOTAL PROTEIN: 5.3 g/dL — AB (ref 6.4–8.3)

## 2017-04-06 LAB — CBC WITH DIFFERENTIAL/PLATELET
BASO%: 0.4 % (ref 0.0–2.0)
BASOS ABS: 0 10*3/uL (ref 0.0–0.1)
EOS ABS: 0.2 10*3/uL (ref 0.0–0.5)
EOS%: 3.1 % (ref 0.0–7.0)
HEMATOCRIT: 39.1 % (ref 34.8–46.6)
HEMOGLOBIN: 12.7 g/dL (ref 11.6–15.9)
LYMPH%: 26.2 % (ref 14.0–49.7)
MCH: 27.9 pg (ref 25.1–34.0)
MCHC: 32.5 g/dL (ref 31.5–36.0)
MCV: 85.7 fL (ref 79.5–101.0)
MONO#: 0.7 10*3/uL (ref 0.1–0.9)
MONO%: 9.1 % (ref 0.0–14.0)
NEUT#: 4.4 10*3/uL (ref 1.5–6.5)
NEUT%: 61.2 % (ref 38.4–76.8)
PLATELETS: 114 10*3/uL — AB (ref 145–400)
RBC: 4.56 10*6/uL (ref 3.70–5.45)
RDW: 14.7 % — ABNORMAL HIGH (ref 11.2–14.5)
WBC: 7.1 10*3/uL (ref 3.9–10.3)
lymph#: 1.9 10*3/uL (ref 0.9–3.3)

## 2017-04-06 LAB — LACTATE DEHYDROGENASE: LDH: 132 U/L (ref 125–245)

## 2017-04-06 MED ORDER — HEPARIN SOD (PORK) LOCK FLUSH 100 UNIT/ML IV SOLN
500.0000 [IU] | Freq: Once | INTRAVENOUS | Status: AC | PRN
  Administered 2017-04-06: 500 [IU]
  Filled 2017-04-06: qty 5

## 2017-04-06 MED ORDER — SODIUM CHLORIDE 0.9 % IV SOLN
500.0000 mg/m2 | Freq: Once | INTRAVENOUS | Status: AC
  Administered 2017-04-06: 800 mg via INTRAVENOUS
  Filled 2017-04-06: qty 50

## 2017-04-06 MED ORDER — SODIUM CHLORIDE 0.9% FLUSH
10.0000 mL | INTRAVENOUS | Status: DC | PRN
  Administered 2017-04-06: 10 mL
  Filled 2017-04-06: qty 10

## 2017-04-06 MED ORDER — ACETAMINOPHEN 325 MG PO TABS
ORAL_TABLET | ORAL | Status: AC
  Filled 2017-04-06: qty 2

## 2017-04-06 MED ORDER — SODIUM CHLORIDE 0.9 % IV SOLN
Freq: Once | INTRAVENOUS | Status: AC
  Administered 2017-04-06: 11:00:00 via INTRAVENOUS

## 2017-04-06 MED ORDER — ACETAMINOPHEN 325 MG PO TABS
650.0000 mg | ORAL_TABLET | Freq: Once | ORAL | Status: AC
  Administered 2017-04-06: 650 mg via ORAL

## 2017-04-06 MED ORDER — SODIUM CHLORIDE 0.9% FLUSH
10.0000 mL | Freq: Once | INTRAVENOUS | Status: AC
  Administered 2017-04-06: 10 mL
  Filled 2017-04-06: qty 10

## 2017-04-06 MED ORDER — DIPHENHYDRAMINE HCL 25 MG PO CAPS
ORAL_CAPSULE | ORAL | Status: AC
  Filled 2017-04-06: qty 2

## 2017-04-06 MED ORDER — DIPHENHYDRAMINE HCL 25 MG PO CAPS
50.0000 mg | ORAL_CAPSULE | Freq: Once | ORAL | Status: AC
  Administered 2017-04-06: 50 mg via ORAL

## 2017-04-06 NOTE — Patient Instructions (Addendum)
[facility] Discharge Instructions for Patients Receiving Chemotherapy  Today you received the following chemotherapy agents: Rituxan   To help prevent nausea and vomiting after your treatment, we encourage you to take your nausea medication as directed.    If you develop nausea and vomiting that is not controlled by your nausea medication, call the clinic.   BELOW ARE SYMPTOMS THAT SHOULD BE REPORTED IMMEDIATELY:  *FEVER GREATER THAN 100.5 F  *CHILLS WITH OR WITHOUT FEVER  NAUSEA AND VOMITING THAT IS NOT CONTROLLED WITH YOUR NAUSEA MEDICATION  *UNUSUAL SHORTNESS OF BREATH  *UNUSUAL BRUISING OR BLEEDING  TENDERNESS IN MOUTH AND THROAT WITH OR WITHOUT PRESENCE OF ULCERS  *URINARY PROBLEMS  *BOWEL PROBLEMS  UNUSUAL RASH Items with * indicate a potential emergency and should be followed up as soon as possible.  Feel free to call the clinic you have any questions or concerns. The clinic phone number is [phone].  Please show the CHEMO ALERT CARD at check-in to the Emergency Department and triage nurse.   

## 2017-04-06 NOTE — Assessment & Plan Note (Signed)
She is not symptomatic apart from minor bruising. I will observe for now. This is likely due to recent treatment.

## 2017-04-06 NOTE — Assessment & Plan Note (Signed)
Recent PET CT scan show significant disease control with greater than 50% response rate on rituximab alone. Due to recent profound diarrhea, Zydelig was placed on hold I do not recommend she start chemotherapy until 04/13/17 For now, I recommend Rituxan I plan to see her back in 2 weeks for further review and assessment of side effects

## 2017-04-06 NOTE — Telephone Encounter (Signed)
Appointments scheduled per 04/06/17 los. Patient was given a copy of the AVS report and appointment schedule per 04/06/17 los

## 2017-04-06 NOTE — Progress Notes (Signed)
[facility] OFFICE PROGRESS NOTE  Patient Care Team: Shon Baton, MD as PCP - General (Internal Medicine) Heath Lark, MD as Consulting Physician (Hematology and Oncology) Tat, Eustace Quail, DO as Consulting Physician (Neurology)  SUMMARY OF ONCOLOGIC HISTORY: Oncology History   CLL stage IV, deletion 13q     CLL (chronic lymphocytic leukemia) ([place])   12/12/2013 Bone Marrow Biopsy    Bone marrow biopsy show significant involvement of CLL      12/13/2013 Procedure    Patient had placement of a port      12/14/2013 Imaging    CT scan of the chest, abdomen and pelvis showed significant lymphadenopathy and splenomegaly      12/20/2013 - 12/22/2013 Hospital Admission    The patient was admitted to the hospital to begin cycle 1 day 1 of chemotherapy due to risk of infusion reaction. She tolerated treatment well without major side effects.      12/20/2013 - 03/13/2014 Chemotherapy    started on cycle 1 of Obinutuzumab, chlorambucil and prednisone. All treatment placed on hold on 03/13/2014 due to persistent pancytopenia.      03/28/2014 Imaging    Repeat imaging study showed excellent response to treatment.      10/25/2014 Imaging    She has persistent mild lymphadenopathy and splenomegaly, improved compared to previous exam      04/23/2015 Imaging    Ct scan showed disease progression      05/09/2015 - 11/26/2015 Chemotherapy    She started taking ibrutinib.      11/26/2015 Adverse Reaction    Treatment  was placed on hold due to neutropenia and recent infection      02/11/2016 - 10/10/2016 Chemotherapy    She is started on monthly IVIG Rx for recurrent infection and acquired panhypogammaglobulinemia      11/17/2016 -  Chemotherapy    The patient is started on Rituximab. Idelisib was started on 2/1      01/06/2017 PET scan    Significant partial treatment response. Persistent mild hypermetabolism within solitary right paratracheal and solitary right hilar lymph nodes,  which both demonstrate decreased metabolism. Otherwise complete metabolic response. Residual mildly enlarged bilateral neck, axillary, retroperitoneal and pelvic lymph nodes have all decreased in size and demonstrate no hypermetabolism. 2. Additional findings include aortic atherosclerosis, three-vessel coronary atherosclerosis and mosaic attenuation in both lungs most commonly due to air trapping from small airways disease      01/12/2017 Adverse Reaction    Idelisib is placed on hold due to neutropenia       INTERVAL HISTORY: Please see below for problem oriented charting. She returns for further follow-up. Since the last time I saw her, diarrhea has resolved. She has started to gain weight. She denies nausea. She has easy bruising from chronic thrombocytopenia, stable. She has just completed antibiotic treatment for recent UTI.  She denies further symptoms in that regard such as frequency, urgency or dysuria  REVIEW OF SYSTEMS:   Constitutional: Denies fevers, chills or abnormal weight loss Eyes: Denies blurriness of vision Ears, nose, mouth, throat, and face: Denies mucositis or sore throat Respiratory: Denies cough, dyspnea or wheezes Cardiovascular: Denies palpitation, chest discomfort or lower extremity swelling Gastrointestinal:  Denies nausea, heartburn or change in bowel habits Skin: Denies abnormal skin rashes Lymphatics: Denies new lymphadenopathy  Neurological:Denies numbness, tingling or new weaknesses Behavioral/Psych: Mood is stable, no new changes  All other systems were reviewed with the patient and are negative.  I have reviewed the past  medical history, past surgical history, social history and family history with the patient and they are unchanged from previous note.  ALLERGIES:  is allergic to bactrim [sulfamethoxazole w/trimethoprim (co-trimoxazole)].  MEDICATIONS:  Current Outpatient Prescriptions  Medication Sig Dispense Refill  . acyclovir (ZOVIRAX) 400  MG tablet Take 1 tablet (400 mg total) by mouth daily. 90 tablet 3  . calcium citrate-vitamin D (CITRACAL+D) 315-200 MG-UNIT per tablet Take 1 tablet by mouth daily.     . cholecalciferol (VITAMIN D) 1000 units tablet Take 1,000 Units by mouth daily.    . diphenhydrAMINE (BENADRYL) 25 mg capsule Take 50 mg by mouth every 8 (eight) hours as needed for itching or allergies.     . diphenoxylate-atropine (LOMOTIL) 2.5-0.025 MG tablet Take 1 tablet by mouth 4 (four) times daily as needed for diarrhea or loose stools. 60 tablet 0  . ESTRACE VAGINAL 0.1 MG/GM vaginal cream Place 1 Applicatorful vaginally 2 (two) times a week.     . folic acid (FOLVITE) 1 MG tablet Take 1 mg by mouth daily.    Marland Kitchen glucosamine-chondroitin 500-400 MG tablet Take 1 tablet by mouth daily.      Marland Kitchen levETIRAcetam (KEPPRA XR) 500 MG 24 hr tablet TAKE 2 TABLETS BY MOUTH  DAILY (Patient taking differently: TAKE 2 TABLETS BY MOUTH  at night) 180 tablet 1  . lidocaine-prilocaine (EMLA) cream Apply 1 application topically as needed. (Patient taking differently: Apply 1 application topically as needed (for port access). ) 30 g 6  . loratadine (CLARITIN) 10 MG tablet Take 10 mg by mouth daily.      . montelukast (SINGULAIR) 10 MG tablet Take 10 mg by mouth daily.     . Multiple Vitamin (MULTIVITAMIN) tablet Take 1 tablet by mouth daily.      . ondansetron (ZOFRAN) 8 MG tablet Take 1 tablet (8 mg total) by mouth every 8 (eight) hours as needed for nausea. 30 tablet 3  . polyvinyl alcohol (ARTIFICIAL TEARS) 1.4 % ophthalmic solution Place 1 drop into both eyes 3 (three) times daily. Reported on 03/10/2016    . sodium chloride (OCEAN) 0.65 % SOLN nasal spray Place 1 spray into both nostrils 2 (two) times daily.     . idelalisib (ZYDELIG) 150 MG tablet Take 1 tablet (150 mg total) by mouth 2 (two) times daily. (Patient not taking: Reported on 04/06/2017) 60 tablet 11  . zolpidem (AMBIEN) 10 MG tablet Take 5 mg by mouth as needed for sleep.     No  current facility-administered medications for this visit.    Facility-Administered Medications Ordered in Other Visits  Medication Dose Route Frequency Provider Last Rate Last Dose  . sodium chloride flush (NS) 0.9 % injection 10 mL  10 mL Intracatheter PRN Alvy Bimler, Iley Breeden, MD   10 mL at 04/06/17 1348    PHYSICAL EXAMINATION: ECOG PERFORMANCE STATUS: 1 - Symptomatic but completely ambulatory  Vitals:   04/06/17 0958  BP: (!) 136/48  Pulse: 83  Resp: 17  Temp: 97.8 F (36.6 C)   Filed Weights   04/06/17 0958  Weight: 111 lb 6.4 oz (50.5 kg)    GENERAL:alert, no distress and comfortable SKIN: skin color, texture, turgor are normal, no rashes or significant lesions.  Noted skin bruising EYES: normal, Conjunctiva are pink and non-injected, sclera clear OROPHARYNX:no exudate, no erythema and lips, buccal mucosa, and tongue normal  NECK: supple, thyroid normal size, non-tender, without nodularity LYMPH:  no palpable lymphadenopathy in the cervical, axillary or inguinal LUNGS: clear to  auscultation and percussion with normal breathing effort HEART: regular rate & rhythm and no murmurs and no lower extremity edema ABDOMEN:abdomen soft, non-tender and normal bowel sounds Musculoskeletal:no cyanosis of digits and no clubbing  NEURO: alert & oriented x 3 with fluent speech, no focal motor/sensory deficits  LABORATORY DATA:  I have reviewed the data as listed    Component Value Date/Time   NA 139 04/06/2017 0929   K 3.9 04/06/2017 0929   CL 102 03/16/2017 1721   CL 99 04/08/2013 0922   CO2 26 04/06/2017 0929   GLUCOSE 131 04/06/2017 0929   GLUCOSE 92 04/08/2013 0922   BUN 15.4 04/06/2017 0929   CREATININE 0.8 04/06/2017 0929   CALCIUM 9.9 04/06/2017 0929   PROT 5.3 (L) 04/06/2017 0929   ALBUMIN 3.5 04/06/2017 0929   AST 19 04/06/2017 0929   ALT 22 04/06/2017 0929   ALKPHOS 61 04/06/2017 0929   BILITOT 0.43 04/06/2017 0929   GFRNONAA >60 03/16/2017 1721   GFRAA >60 03/16/2017  1721    No results found for: SPEP, UPEP  Lab Results  Component Value Date   WBC 7.1 04/06/2017   NEUTROABS 4.4 04/06/2017   HGB 12.7 04/06/2017   HCT 39.1 04/06/2017   MCV 85.7 04/06/2017   PLT 114 (L) 04/06/2017      Chemistry      Component Value Date/Time   NA 139 04/06/2017 0929   K 3.9 04/06/2017 0929   CL 102 03/16/2017 1721   CL 99 04/08/2013 0922   CO2 26 04/06/2017 0929   BUN 15.4 04/06/2017 0929   CREATININE 0.8 04/06/2017 0929      Component Value Date/Time   CALCIUM 9.9 04/06/2017 0929   ALKPHOS 61 04/06/2017 0929   AST 19 04/06/2017 0929   ALT 22 04/06/2017 0929   BILITOT 0.43 04/06/2017 0929       RADIOGRAPHIC STUDIES: I have personally reviewed the radiological images as listed and agreed with the findings in the report. Dg Chest 2 View  Result Date: 03/16/2017 CLINICAL DATA:  83 year old female with chronic lymphocytic leukemia with cough. Initial encounter. EXAM: CHEST  2 VIEW COMPARISON:  10/28/2013 chest x-ray.  01/06/2017 chest CT. FINDINGS: Chronic lung changes without segmental consolidation noted. Calcified mediastinal lymph nodes. MediPort catheter tip distal superior vena cava. Calcified mildly tortuous aorta. Heart size within normal limits. Degenerative changes upper lumbar spine. No acute osseous abnormality. Degenerative changes cervical spine. IMPRESSION: Chronic lung changes without segmental consolidation. Calcified mediastinal lymph nodes. Calcified tortuous aorta. Electronically Signed   By: Genia Del M.D.   On: 03/16/2017 17:57    ASSESSMENT & PLAN:  CLL (chronic lymphocytic leukemia) Recent PET CT scan show significant disease control with greater than 50% response rate on rituximab alone. Due to recent profound diarrhea, Zydelig was placed on hold I do not recommend she start chemotherapy until 04/13/17 For now, I recommend Rituxan I plan to see her back in 2 weeks for further review and assessment of side  effects  Thrombocytopenia ([place]) She is not symptomatic apart from minor bruising. I will observe for now. This is likely due to recent treatment.    No orders of the defined types were placed in this encounter.  All questions were answered. The patient knows to call the clinic with any problems, questions or concerns. No barriers to learning was detected. I spent 15 minutes counseling the patient face to face. The total time spent in the appointment was 20 minutes and more than 50%  was on counseling and review of test results     Heath Lark, MD 04/06/2017 3:21 PM

## 2017-04-22 ENCOUNTER — Other Ambulatory Visit (HOSPITAL_BASED_OUTPATIENT_CLINIC_OR_DEPARTMENT_OTHER): Payer: Medicare Other

## 2017-04-22 ENCOUNTER — Encounter: Payer: Self-pay | Admitting: Hematology and Oncology

## 2017-04-22 ENCOUNTER — Telehealth: Payer: Self-pay | Admitting: Hematology and Oncology

## 2017-04-22 ENCOUNTER — Ambulatory Visit (HOSPITAL_BASED_OUTPATIENT_CLINIC_OR_DEPARTMENT_OTHER): Payer: Medicare Other | Admitting: Hematology and Oncology

## 2017-04-22 VITALS — BP 139/61 | HR 72 | Temp 98.6°F | Resp 18 | Ht 60.0 in | Wt 111.2 lb

## 2017-04-22 DIAGNOSIS — D696 Thrombocytopenia, unspecified: Secondary | ICD-10-CM | POA: Diagnosis not present

## 2017-04-22 DIAGNOSIS — C9110 Chronic lymphocytic leukemia of B-cell type not having achieved remission: Secondary | ICD-10-CM | POA: Diagnosis not present

## 2017-04-22 DIAGNOSIS — R748 Abnormal levels of other serum enzymes: Secondary | ICD-10-CM

## 2017-04-22 LAB — COMPREHENSIVE METABOLIC PANEL
ALT: 126 U/L — AB (ref 0–55)
ANION GAP: 8 meq/L (ref 3–11)
AST: 86 U/L — AB (ref 5–34)
Albumin: 3.7 g/dL (ref 3.5–5.0)
Alkaline Phosphatase: 71 U/L (ref 40–150)
BILIRUBIN TOTAL: 0.42 mg/dL (ref 0.20–1.20)
BUN: 8.2 mg/dL (ref 7.0–26.0)
CALCIUM: 9.5 mg/dL (ref 8.4–10.4)
CHLORIDE: 104 meq/L (ref 98–109)
CO2: 26 meq/L (ref 22–29)
CREATININE: 0.8 mg/dL (ref 0.6–1.1)
EGFR: 73 mL/min/{1.73_m2} — ABNORMAL LOW (ref >90)
Glucose: 104 mg/dl (ref 70–140)
Potassium: 4.8 mEq/L (ref 3.5–5.1)
Sodium: 137 mEq/L (ref 136–145)
TOTAL PROTEIN: 5.8 g/dL — AB (ref 6.4–8.3)

## 2017-04-22 LAB — CBC WITH DIFFERENTIAL/PLATELET
BASO%: 1.5 % (ref 0.0–2.0)
Basophils Absolute: 0.1 10*3/uL (ref 0.0–0.1)
EOS%: 3.4 % (ref 0.0–7.0)
Eosinophils Absolute: 0.3 10*3/uL (ref 0.0–0.5)
HEMATOCRIT: 37.8 % (ref 34.8–46.6)
HEMOGLOBIN: 12.6 g/dL (ref 11.6–15.9)
LYMPH#: 4.4 10*3/uL — AB (ref 0.9–3.3)
LYMPH%: 58.5 % — ABNORMAL HIGH (ref 14.0–49.7)
MCH: 27.9 pg (ref 25.1–34.0)
MCHC: 33.4 g/dL (ref 31.5–36.0)
MCV: 83.7 fL (ref 79.5–101.0)
MONO#: 0.6 10*3/uL (ref 0.1–0.9)
MONO%: 8.3 % (ref 0.0–14.0)
NEUT%: 28.3 % — ABNORMAL LOW (ref 38.4–76.8)
NEUTROS ABS: 2.1 10*3/uL (ref 1.5–6.5)
PLATELETS: 120 10*3/uL — AB (ref 145–400)
RBC: 4.52 10*6/uL (ref 3.70–5.45)
RDW: 15.6 % — AB (ref 11.2–14.5)
WBC: 7.6 10*3/uL (ref 3.9–10.3)

## 2017-04-22 MED ORDER — MIRTAZAPINE 15 MG PO TABS: 15.0000 mg | ORAL_TABLET | Freq: Every day | ORAL | 1 refills | Status: DC

## 2017-04-22 NOTE — Telephone Encounter (Signed)
Appointments scheduled per 04/22/17 los. Patient was given a copy of the AVS report and appointment schedule, per 04/04/17 los.

## 2017-04-23 NOTE — Assessment & Plan Note (Signed)
She is not symptomatic apart from minor bruising. I will observe for now. This is likely due to recent treatment.

## 2017-04-23 NOTE — Assessment & Plan Note (Addendum)
Recent PET CT scan show significant disease control with greater than 50% response rate on rituximab alone. Due to recent profound elevated liver enzymes, Zydelig is again placed on hold I plan to restage her in 2 weeks and bring her back to review test results before resumption of treatment

## 2017-04-23 NOTE — Assessment & Plan Note (Signed)
Her liver enzymes could be due to chemotherapy. I will recommend holding off Zydelig until her return visit

## 2017-04-23 NOTE — Progress Notes (Signed)
[facility] OFFICE PROGRESS NOTE  Patient Care Team: Shon Baton, MD as PCP - General (Internal Medicine) Heath Lark, MD as Consulting Physician (Hematology and Oncology) Tat, Eustace Quail, DO as Consulting Physician (Neurology)  SUMMARY OF ONCOLOGIC HISTORY: Oncology History   CLL stage IV, deletion 13q     CLL (chronic lymphocytic leukemia) ([place])   12/12/2013 Bone Marrow Biopsy    Bone marrow biopsy show significant involvement of CLL      12/13/2013 Procedure    Patient had placement of a port      12/14/2013 Imaging    CT scan of the chest, abdomen and pelvis showed significant lymphadenopathy and splenomegaly      12/20/2013 - 12/22/2013 Hospital Admission    The patient was admitted to the hospital to begin cycle 1 day 1 of chemotherapy due to risk of infusion reaction. She tolerated treatment well without major side effects.      12/20/2013 - 03/13/2014 Chemotherapy    started on cycle 1 of Obinutuzumab, chlorambucil and prednisone. All treatment placed on hold on 03/13/2014 due to persistent pancytopenia.      03/28/2014 Imaging    Repeat imaging study showed excellent response to treatment.      10/25/2014 Imaging    She has persistent mild lymphadenopathy and splenomegaly, improved compared to previous exam      04/23/2015 Imaging    Ct scan showed disease progression      05/09/2015 - 11/26/2015 Chemotherapy    She started taking ibrutinib.      11/26/2015 Adverse Reaction    Treatment  was placed on hold due to neutropenia and recent infection      02/11/2016 - 10/10/2016 Chemotherapy    She is started on monthly IVIG Rx for recurrent infection and acquired panhypogammaglobulinemia      11/17/2016 -  Chemotherapy    The patient is started on Rituximab. Idelisib was started on 2/1      01/06/2017 PET scan    Significant partial treatment response. Persistent mild hypermetabolism within solitary right paratracheal and solitary right hilar lymph nodes,  which both demonstrate decreased metabolism. Otherwise complete metabolic response. Residual mildly enlarged bilateral neck, axillary, retroperitoneal and pelvic lymph nodes have all decreased in size and demonstrate no hypermetabolism. 2. Additional findings include aortic atherosclerosis, three-vessel coronary atherosclerosis and mosaic attenuation in both lungs most commonly due to air trapping from small airways disease      01/12/2017 Adverse Reaction    Idelisib is placed on hold due to neutropenia       INTERVAL HISTORY: Please see below for problem oriented charting. She returns with her husband for further follow-up She resume treatment a week ago Since then, she had poor energy and some nausea Denies vomiting, fever or chills The patient denies any recent signs or symptoms of bleeding such as spontaneous epistaxis, hematuria or hematochezia.  REVIEW OF SYSTEMS:   Constitutional: Denies fevers, chills or abnormal weight loss Eyes: Denies blurriness of vision Ears, nose, mouth, throat, and face: Denies mucositis or sore throat Respiratory: Denies cough, dyspnea or wheezes Cardiovascular: Denies palpitation, chest discomfort or lower extremity swelling Skin: Denies abnormal skin rashes Lymphatics: Denies new lymphadenopathy or easy bruising Neurological:Denies numbness, tingling or new weaknesses Behavioral/Psych: Mood is stable, no new changes  All other systems were reviewed with the patient and are negative.  I have reviewed the past medical history, past surgical history, social history and family history with the patient and they are unchanged from  previous note.  ALLERGIES:  is allergic to bactrim [sulfamethoxazole w/trimethoprim (co-trimoxazole)].  MEDICATIONS:  Current Outpatient Prescriptions  Medication Sig Dispense Refill  . acyclovir (ZOVIRAX) 400 MG tablet Take 1 tablet (400 mg total) by mouth daily. 90 tablet 3  . calcium citrate-vitamin D (CITRACAL+D) 315-200  MG-UNIT per tablet Take 1 tablet by mouth daily.     . cholecalciferol (VITAMIN D) 1000 units tablet Take 1,000 Units by mouth daily.    . diphenhydrAMINE (BENADRYL) 25 mg capsule Take 50 mg by mouth every 8 (eight) hours as needed for itching or allergies.     . diphenoxylate-atropine (LOMOTIL) 2.5-0.025 MG tablet Take 1 tablet by mouth 4 (four) times daily as needed for diarrhea or loose stools. 60 tablet 0  . ESTRACE VAGINAL 0.1 MG/GM vaginal cream Place 1 Applicatorful vaginally 2 (two) times a week.     . folic acid (FOLVITE) 1 MG tablet Take 1 mg by mouth daily.    Marland Kitchen glucosamine-chondroitin 500-400 MG tablet Take 1 tablet by mouth daily.      . idelalisib (ZYDELIG) 150 MG tablet Take 1 tablet (150 mg total) by mouth 2 (two) times daily. (Patient not taking: Reported on 04/06/2017) 60 tablet 11  . levETIRAcetam (KEPPRA XR) 500 MG 24 hr tablet TAKE 2 TABLETS BY MOUTH  DAILY (Patient taking differently: TAKE 2 TABLETS BY MOUTH  at night) 180 tablet 1  . lidocaine-prilocaine (EMLA) cream Apply 1 application topically as needed. (Patient taking differently: Apply 1 application topically as needed (for port access). ) 30 g 6  . loratadine (CLARITIN) 10 MG tablet Take 10 mg by mouth daily.      . mirtazapine (REMERON) 15 MG tablet Take 1 tablet (15 mg total) by mouth at bedtime. 30 tablet 1  . montelukast (SINGULAIR) 10 MG tablet Take 10 mg by mouth daily.     . Multiple Vitamin (MULTIVITAMIN) tablet Take 1 tablet by mouth daily.      . ondansetron (ZOFRAN) 8 MG tablet Take 1 tablet (8 mg total) by mouth every 8 (eight) hours as needed for nausea. 30 tablet 3  . polyvinyl alcohol (ARTIFICIAL TEARS) 1.4 % ophthalmic solution Place 1 drop into both eyes 3 (three) times daily. Reported on 03/10/2016    . sodium chloride (OCEAN) 0.65 % SOLN nasal spray Place 1 spray into both nostrils 2 (two) times daily.     Marland Kitchen zolpidem (AMBIEN) 10 MG tablet Take 5 mg by mouth as needed for sleep.     No current  facility-administered medications for this visit.     PHYSICAL EXAMINATION: ECOG PERFORMANCE STATUS: 1 - Symptomatic but completely ambulatory  Vitals:   04/22/17 1156  BP: 139/61  Pulse: 72  Resp: 18  Temp: 98.6 F (37 C)   Filed Weights   04/22/17 1156  Weight: 111 lb 3.2 oz (50.4 kg)    GENERAL:alert, no distress and comfortable SKIN: skin color, texture, turgor are normal, no rashes or significant lesions EYES: normal, Conjunctiva are pink and non-injected, sclera clear OROPHARYNX:no exudate, no erythema and lips, buccal mucosa, and tongue normal  NECK: supple, thyroid normal size, non-tender, without nodularity LYMPH:  no palpable lymphadenopathy in the cervical, axillary or inguinal LUNGS: clear to auscultation and percussion with normal breathing effort HEART: regular rate & rhythm and no murmurs and no lower extremity edema ABDOMEN:abdomen soft, non-tender and normal bowel sounds Musculoskeletal:no cyanosis of digits and no clubbing  NEURO: alert & oriented x 3 with fluent speech, no focal motor/sensory  deficits  LABORATORY DATA:  I have reviewed the data as listed    Component Value Date/Time   NA 137 04/22/2017 1132   K 4.8 04/22/2017 1132   CL 102 03/16/2017 1721   CL 99 04/08/2013 0922   CO2 26 04/22/2017 1132   GLUCOSE 104 04/22/2017 1132   GLUCOSE 92 04/08/2013 0922   BUN 8.2 04/22/2017 1132   CREATININE 0.8 04/22/2017 1132   CALCIUM 9.5 04/22/2017 1132   PROT 5.8 (L) 04/22/2017 1132   ALBUMIN 3.7 04/22/2017 1132   AST 86 (H) 04/22/2017 1132   ALT 126 (H) 04/22/2017 1132   ALKPHOS 71 04/22/2017 1132   BILITOT 0.42 04/22/2017 1132   GFRNONAA >60 03/16/2017 1721   GFRAA >60 03/16/2017 1721    No results found for: SPEP, UPEP  Lab Results  Component Value Date   WBC 7.6 04/22/2017   NEUTROABS 2.1 04/22/2017   HGB 12.6 04/22/2017   HCT 37.8 04/22/2017   MCV 83.7 04/22/2017   PLT 120 (L) 04/22/2017      Chemistry      Component Value  Date/Time   NA 137 04/22/2017 1132   K 4.8 04/22/2017 1132   CL 102 03/16/2017 1721   CL 99 04/08/2013 0922   CO2 26 04/22/2017 1132   BUN 8.2 04/22/2017 1132   CREATININE 0.8 04/22/2017 1132      Component Value Date/Time   CALCIUM 9.5 04/22/2017 1132   ALKPHOS 71 04/22/2017 1132   AST 86 (H) 04/22/2017 1132   ALT 126 (H) 04/22/2017 1132   BILITOT 0.42 04/22/2017 1132       ASSESSMENT & PLAN:  CLL (chronic lymphocytic leukemia) Recent PET CT scan show significant disease control with greater than 50% response rate on rituximab alone. Due to recent profound elevated liver enzymes, Zydelig is again placed on hold I plan to restage her in 2 weeks and bring her back to review test results before resumption of treatment  Elevated liver enzymes Her liver enzymes could be due to chemotherapy. I will recommend holding off Zydelig until her return visit  Thrombocytopenia ([place]) She is not symptomatic apart from minor bruising. I will observe for now. This is likely due to recent treatment.    Orders Placed This Encounter  Procedures  . NM PET Image Restag (PS) Skull Base To Thigh    Standing Status:   Future    Standing Expiration Date:   06/22/2018    Order Specific Question:   Reason for Exam (SYMPTOM  OR DIAGNOSIS REQUIRED)    Answer:   staging lymphoma, assess response to Rx    Order Specific Question:   Preferred imaging location?    Answer:   [facility]  . TSH    Standing Status:   Future    Standing Expiration Date:   05/27/2018   All questions were answered. The patient knows to call the clinic with any problems, questions or concerns. No barriers to learning was detected. I spent 15 minutes counseling the patient face to face. The total time spent in the appointment was 20 minutes and more than 50% was on counseling and review of test results     Heath Lark, MD 04/23/2017 3:25 PM

## 2017-05-13 ENCOUNTER — Ambulatory Visit (HOSPITAL_BASED_OUTPATIENT_CLINIC_OR_DEPARTMENT_OTHER): Payer: Medicare Other

## 2017-05-13 ENCOUNTER — Other Ambulatory Visit: Payer: Medicare Other

## 2017-05-13 ENCOUNTER — Other Ambulatory Visit (HOSPITAL_BASED_OUTPATIENT_CLINIC_OR_DEPARTMENT_OTHER): Payer: Medicare Other

## 2017-05-13 ENCOUNTER — Ambulatory Visit (HOSPITAL_COMMUNITY)
Admission: RE | Admit: 2017-05-13 | Discharge: 2017-05-13 | Disposition: A | Discharge status: Home / Self Care | Payer: Medicare Other | Source: Ambulatory Visit | Attending: Hematology and Oncology | Admitting: Hematology and Oncology

## 2017-05-13 VITALS — BP 109/45 | HR 77 | Temp 97.9°F | Resp 18

## 2017-05-13 DIAGNOSIS — D696 Thrombocytopenia, unspecified: Secondary | ICD-10-CM | POA: Diagnosis not present

## 2017-05-13 DIAGNOSIS — R591 Generalized enlarged lymph nodes: Secondary | ICD-10-CM | POA: Insufficient documentation

## 2017-05-13 DIAGNOSIS — C9110 Chronic lymphocytic leukemia of B-cell type not having achieved remission: Secondary | ICD-10-CM

## 2017-05-13 DIAGNOSIS — Z452 Encounter for adjustment and management of vascular access device: Secondary | ICD-10-CM | POA: Diagnosis not present

## 2017-05-13 DIAGNOSIS — J320 Chronic maxillary sinusitis: Secondary | ICD-10-CM | POA: Diagnosis not present

## 2017-05-13 DIAGNOSIS — Z79899 Other long term (current) drug therapy: Secondary | ICD-10-CM | POA: Diagnosis not present

## 2017-05-13 DIAGNOSIS — I700 Atherosclerosis of aorta: Secondary | ICD-10-CM | POA: Diagnosis not present

## 2017-05-13 DIAGNOSIS — D801 Nonfamilial hypogammaglobulinemia: Secondary | ICD-10-CM

## 2017-05-13 LAB — CBC WITH DIFFERENTIAL/PLATELET
BASO%: 1.1 % (ref 0.0–2.0)
Basophils Absolute: 0.1 10*3/uL (ref 0.0–0.1)
EOS%: 4.3 % (ref 0.0–7.0)
Eosinophils Absolute: 0.4 10*3/uL (ref 0.0–0.5)
HEMATOCRIT: 38.9 % (ref 34.8–46.6)
HGB: 13 g/dL (ref 11.6–15.9)
LYMPH#: 2.1 10*3/uL (ref 0.9–3.3)
LYMPH%: 23.7 % (ref 14.0–49.7)
MCH: 28.4 pg (ref 25.1–34.0)
MCHC: 33.5 g/dL (ref 31.5–36.0)
MCV: 84.7 fL (ref 79.5–101.0)
MONO#: 0.9 10*3/uL (ref 0.1–0.9)
MONO%: 10.3 % (ref 0.0–14.0)
NEUT%: 60.6 % (ref 38.4–76.8)
NEUTROS ABS: 5.3 10*3/uL (ref 1.5–6.5)
PLATELETS: 128 10*3/uL — AB (ref 145–400)
RBC: 4.59 10*6/uL (ref 3.70–5.45)
RDW: 15.6 % — ABNORMAL HIGH (ref 11.2–14.5)
WBC: 8.7 10*3/uL (ref 3.9–10.3)

## 2017-05-13 LAB — COMPREHENSIVE METABOLIC PANEL
ALBUMIN: 3.6 g/dL (ref 3.5–5.0)
ALK PHOS: 247 U/L — AB (ref 40–150)
ALT: 68 U/L — AB (ref 0–55)
AST: 35 U/L — ABNORMAL HIGH (ref 5–34)
Anion Gap: 9 mEq/L (ref 3–11)
BILIRUBIN TOTAL: 0.67 mg/dL (ref 0.20–1.20)
BUN: 9.7 mg/dL (ref 7.0–26.0)
CO2: 23 meq/L (ref 22–29)
CREATININE: 0.7 mg/dL (ref 0.6–1.1)
Calcium: 9.6 mg/dL (ref 8.4–10.4)
Chloride: 104 mEq/L (ref 98–109)
EGFR: 75 mL/min/{1.73_m2} — AB (ref >90)
GLUCOSE: 100 mg/dL (ref 70–140)
Potassium: 4.1 mEq/L (ref 3.5–5.1)
SODIUM: 136 meq/L (ref 136–145)
TOTAL PROTEIN: 5.3 g/dL — AB (ref 6.4–8.3)

## 2017-05-13 LAB — TSH: TSH: 1.498 m[IU]/L (ref 0.308–3.960)

## 2017-05-13 LAB — GLUCOSE, CAPILLARY: Glucose-Capillary: 90 mg/dL (ref 65–99)

## 2017-05-13 MED ORDER — FLUDEOXYGLUCOSE F - 18 (FDG) INJECTION
5.4100 | Freq: Once | INTRAVENOUS | Status: AC | PRN
  Administered 2017-05-13: 5.41 via INTRAVENOUS

## 2017-05-13 MED ORDER — SODIUM CHLORIDE 0.9% FLUSH
10.0000 mL | INTRAVENOUS | Status: DC | PRN
  Administered 2017-05-13: 10 mL
  Filled 2017-05-13: qty 10

## 2017-05-13 NOTE — Patient Instructions (Signed)
Implanted Port Home Guide An implanted port is a type of central line that is placed under the skin. Central lines are used to provide IV access when treatment or nutrition needs to be given through a person's veins. Implanted ports are used for long-term IV access. An implanted port may be placed because:  You need IV medicine that would be irritating to the small veins in your hands or arms.  You need long-term IV medicines, such as antibiotics.  You need IV nutrition for a long period.  You need frequent blood draws for lab tests.  You need dialysis.  Implanted ports are usually placed in the chest area, but they can also be placed in the upper arm, the abdomen, or the leg. An implanted port has two main parts:  Reservoir. The reservoir is round and will appear as a small, raised area under your skin. The reservoir is the part where a needle is inserted to give medicines or draw blood.  Catheter. The catheter is a thin, flexible tube that extends from the reservoir. The catheter is placed into a large vein. Medicine that is inserted into the reservoir goes into the catheter and then into the vein.  How will I care for my incision site? Do not get the incision site wet. Bathe or shower as directed by your health care provider. How is my port accessed? Special steps must be taken to access the port:  Before the port is accessed, a numbing cream can be placed on the skin. This helps numb the skin over the port site.  Your health care provider uses a sterile technique to access the port. ? Your health care provider must put on a mask and sterile gloves. ? The skin over your port is cleaned carefully with an antiseptic and allowed to dry. ? The port is gently pinched between sterile gloves, and a needle is inserted into the port.  Only "non-coring" port needles should be used to access the port. Once the port is accessed, a blood return should be checked. This helps ensure that the port  is in the vein and is not clogged.  If your port needs to remain accessed for a constant infusion, a clear (transparent) bandage will be placed over the needle site. The bandage and needle will need to be changed every week, or as directed by your health care provider.  Keep the bandage covering the needle clean and dry. Do not get it wet. Follow your health care provider's instructions on how to take a shower or bath while the port is accessed.  If your port does not need to stay accessed, no bandage is needed over the port.  What is flushing? Flushing helps keep the port from getting clogged. Follow your health care provider's instructions on how and when to flush the port. Ports are usually flushed with saline solution or a medicine called heparin. The need for flushing will depend on how the port is used.  If the port is used for intermittent medicines or blood draws, the port will need to be flushed: ? After medicines have been given. ? After blood has been drawn. ? As part of routine maintenance.  If a constant infusion is running, the port may not need to be flushed.  How long will my port stay implanted? The port can stay in for as long as your health care provider thinks it is needed. When it is time for the port to come out, surgery will be   done to remove it. The procedure is similar to the one performed when the port was put in. When should I seek immediate medical care? When you have an implanted port, you should seek immediate medical care if:  You notice a bad smell coming from the incision site.  You have swelling, redness, or drainage at the incision site.  You have more swelling or pain at the port site or the surrounding area.  You have a fever that is not controlled with medicine.  This information is not intended to replace advice given to you by your health care provider. Make sure you discuss any questions you have with your health care provider. Document  Released: 10/20/2005 Document Revised: 03/27/2016 Document Reviewed: 06/27/2013 Elsevier Interactive Patient Education  2017 [employer]  

## 2017-05-14 ENCOUNTER — Ambulatory Visit (HOSPITAL_BASED_OUTPATIENT_CLINIC_OR_DEPARTMENT_OTHER): Payer: Medicare Other | Admitting: Hematology and Oncology

## 2017-05-14 ENCOUNTER — Telehealth: Payer: Self-pay | Admitting: Hematology and Oncology

## 2017-05-14 DIAGNOSIS — C9110 Chronic lymphocytic leukemia of B-cell type not having achieved remission: Secondary | ICD-10-CM

## 2017-05-14 DIAGNOSIS — D696 Thrombocytopenia, unspecified: Secondary | ICD-10-CM

## 2017-05-14 DIAGNOSIS — R748 Abnormal levels of other serum enzymes: Secondary | ICD-10-CM | POA: Diagnosis not present

## 2017-05-14 MED ORDER — PREDNISONE 5 MG PO TABS: 5.0000 mg | ORAL_TABLET | Freq: Every day | ORAL | 1 refills | Status: DC

## 2017-05-14 NOTE — Telephone Encounter (Signed)
Gv pt appt for 8/9 @ 1.15pm.

## 2017-05-15 ENCOUNTER — Encounter: Payer: Self-pay | Admitting: Hematology and Oncology

## 2017-05-15 NOTE — Assessment & Plan Note (Addendum)
PET CT scan show persistent residual disease She did not tolerate Zydelig well I think it is prudent to consider switching treatment in the near future She has lost some weight so I do not think she is ready to begin new treatment I recommend she starts prednisone 5 mg daily to help her gain some appetite, stabilize chronic sinusitis and for disease control for CLL She would take Zydelig at half a dose twice a day and I suspect she will finish her treatment by early August I plan to see her back at the time for further discussion about treatment options

## 2017-05-15 NOTE — Assessment & Plan Note (Signed)
Her elevated liver enzymes are likely related to recent chemotherapy I recommend she resume treatment at half a dose along with prednisone therapy and a plan to recheck next month

## 2017-05-15 NOTE — Progress Notes (Signed)
[facility] OFFICE PROGRESS NOTE  Patient Care Team: Shon Baton, MD as PCP - General (Internal Medicine) Heath Lark, MD as Consulting Physician (Hematology and Oncology) Tat, Eustace Quail, DO as Consulting Physician (Neurology)  SUMMARY OF ONCOLOGIC HISTORY: Oncology History   CLL stage IV, deletion 13q     CLL (chronic lymphocytic leukemia) ([place])   12/12/2013 Bone Marrow Biopsy    Bone marrow biopsy show significant involvement of CLL      12/13/2013 Procedure    Patient had placement of a port      12/14/2013 Imaging    CT scan of the chest, abdomen and pelvis showed significant lymphadenopathy and splenomegaly      12/20/2013 - 12/22/2013 Hospital Admission    The patient was admitted to the hospital to begin cycle 1 day 1 of chemotherapy due to risk of infusion reaction. She tolerated treatment well without major side effects.      12/20/2013 - 03/13/2014 Chemotherapy    started on cycle 1 of Obinutuzumab, chlorambucil and prednisone. All treatment placed on hold on 03/13/2014 due to persistent pancytopenia.      03/28/2014 Imaging    Repeat imaging study showed excellent response to treatment.      10/25/2014 Imaging    She has persistent mild lymphadenopathy and splenomegaly, improved compared to previous exam      04/23/2015 Imaging    Ct scan showed disease progression      05/09/2015 - 11/26/2015 Chemotherapy    She started taking ibrutinib.      11/26/2015 Adverse Reaction    Treatment  was placed on hold due to neutropenia and recent infection      02/11/2016 - 10/10/2016 Chemotherapy    She is started on monthly IVIG Rx for recurrent infection and acquired panhypogammaglobulinemia      11/17/2016 -  Chemotherapy    The patient is started on Rituximab. Idelisib was started on 2/1      01/06/2017 PET scan    Significant partial treatment response. Persistent mild hypermetabolism within solitary right paratracheal and solitary right hilar lymph nodes,  which both demonstrate decreased metabolism. Otherwise complete metabolic response. Residual mildly enlarged bilateral neck, axillary, retroperitoneal and pelvic lymph nodes have all decreased in size and demonstrate no hypermetabolism. 2. Additional findings include aortic atherosclerosis, three-vessel coronary atherosclerosis and mosaic attenuation in both lungs most commonly due to air trapping from small airways disease      01/12/2017 Adverse Reaction    Idelisib is placed on hold due to neutropenia      05/13/2017 PET scan    1. Mild enlargement of nodal disease in the neck, chest, abdomen, and pelvis. Most of the nodes are slightly increased activity although the right hilar node demonstrates the opposite. Current nodal disease is primarily Deauville 3 ; the right paratracheal and right hilar lymph nodes are Deauville 4. 2. Aortic Atherosclerosis (ICD10-I70.0). 3. A large calcified paraesophageal lymph node exert some mass effect on the esophagus. 4. Minimal chronic left maxillary sinusitis.       INTERVAL HISTORY: Please see below for problem oriented charting.. She returns with her husband for further follow-up She continues to have chronic congestion She denies new lymphadenopathy Her appetite is poor and she has lost some weight since the last time I saw her She denies fever or chills No recent nausea or constipation  REVIEW OF SYSTEMS:   Constitutional: Denies fevers, chills or abnormal weight loss Eyes: Denies blurriness of vision Ears, nose, mouth, throat,  and face: Denies mucositis or sore throat Respiratory: Denies cough, dyspnea or wheezes Cardiovascular: Denies palpitation, chest discomfort or lower extremity swelling Gastrointestinal:  Denies nausea, heartburn or change in bowel habits Skin: Denies abnormal skin rashes Lymphatics: Denies new lymphadenopathy or easy bruising Neurological:Denies numbness, tingling or new weaknesses Behavioral/Psych: Mood is stable,  no new changes  All other systems were reviewed with the patient and are negative.  I have reviewed the past medical history, past surgical history, social history and family history with the patient and they are unchanged from previous note.  ALLERGIES:  is allergic to bactrim [sulfamethoxazole w/trimethoprim (co-trimoxazole)].  MEDICATIONS:  Current Outpatient Prescriptions  Medication Sig Dispense Refill  . acyclovir (ZOVIRAX) 400 MG tablet Take 1 tablet (400 mg total) by mouth daily. 90 tablet 3  . calcium citrate-vitamin D (CITRACAL+D) 315-200 MG-UNIT per tablet Take 1 tablet by mouth daily.     . cholecalciferol (VITAMIN D) 1000 units tablet Take 1,000 Units by mouth daily.    . diphenhydrAMINE (BENADRYL) 25 mg capsule Take 50 mg by mouth every 8 (eight) hours as needed for itching or allergies.     . diphenoxylate-atropine (LOMOTIL) 2.5-0.025 MG tablet Take 1 tablet by mouth 4 (four) times daily as needed for diarrhea or loose stools. 60 tablet 0  . ESTRACE VAGINAL 0.1 MG/GM vaginal cream Place 1 Applicatorful vaginally 2 (two) times a week.     . folic acid (FOLVITE) 1 MG tablet Take 1 mg by mouth daily.    Marland Kitchen glucosamine-chondroitin 500-400 MG tablet Take 1 tablet by mouth daily.      . idelalisib (ZYDELIG) 150 MG tablet Take 1 tablet (150 mg total) by mouth 2 (two) times daily. (Patient not taking: Reported on 04/06/2017) 60 tablet 11  . levETIRAcetam (KEPPRA XR) 500 MG 24 hr tablet TAKE 2 TABLETS BY MOUTH  DAILY (Patient taking differently: TAKE 2 TABLETS BY MOUTH  at night) 180 tablet 1  . lidocaine-prilocaine (EMLA) cream Apply 1 application topically as needed. (Patient taking differently: Apply 1 application topically as needed (for port access). ) 30 g 6  . loratadine (CLARITIN) 10 MG tablet Take 10 mg by mouth daily.      . mirtazapine (REMERON) 15 MG tablet Take 1 tablet (15 mg total) by mouth at bedtime. 30 tablet 1  . montelukast (SINGULAIR) 10 MG tablet Take 10 mg by mouth  daily.     . Multiple Vitamin (MULTIVITAMIN) tablet Take 1 tablet by mouth daily.      . ondansetron (ZOFRAN) 8 MG tablet Take 1 tablet (8 mg total) by mouth every 8 (eight) hours as needed for nausea. 30 tablet 3  . polyvinyl alcohol (ARTIFICIAL TEARS) 1.4 % ophthalmic solution Place 1 drop into both eyes 3 (three) times daily. Reported on 03/10/2016    . predniSONE (DELTASONE) 5 MG tablet Take 1 tablet (5 mg total) by mouth daily with breakfast. 30 tablet 1  . sodium chloride (OCEAN) 0.65 % SOLN nasal spray Place 1 spray into both nostrils 2 (two) times daily.     Marland Kitchen zolpidem (AMBIEN) 10 MG tablet Take 5 mg by mouth as needed for sleep.     No current facility-administered medications for this visit.     PHYSICAL EXAMINATION: ECOG PERFORMANCE STATUS: 1 - Symptomatic but completely ambulatory  Vitals:   05/14/17 1306  BP: (!) 112/39  Pulse: 71  Resp: 17  Temp: 98.6 F (37 C)   Filed Weights   05/14/17 1306  Weight: 109  lb 9.6 oz (49.7 kg)    GENERAL:alert, no distress and comfortable.  She looks thin and mildly cachectic SKIN: skin color, texture, turgor are normal, no rashes or significant lesions.  She has extensive skin bruising EYES: normal, Conjunctiva are pink and non-injected, sclera clear OROPHARYNX:no exudate, no erythema and lips, buccal mucosa, and tongue normal  NECK: supple, thyroid normal size, non-tender, without nodularity LYMPH:  no palpable lymphadenopathy in the cervical, axillary or inguinal LUNGS: clear to auscultation and percussion with normal breathing effort HEART: regular rate & rhythm and no murmurs and no lower extremity edema ABDOMEN:abdomen soft, non-tender and normal bowel sounds Musculoskeletal:no cyanosis of digits and no clubbing  NEURO: alert & oriented x 3 with fluent speech, no focal motor/sensory deficits  LABORATORY DATA:  I have reviewed the data as listed    Component Value Date/Time   NA 136 05/13/2017 0824   K 4.1 05/13/2017 0824    CL 102 03/16/2017 1721   CL 99 04/08/2013 0922   CO2 23 05/13/2017 0824   GLUCOSE 100 05/13/2017 0824   GLUCOSE 92 04/08/2013 0922   BUN 9.7 05/13/2017 0824   CREATININE 0.7 05/13/2017 0824   CALCIUM 9.6 05/13/2017 0824   PROT 5.3 (L) 05/13/2017 0824   ALBUMIN 3.6 05/13/2017 0824   AST 35 (H) 05/13/2017 0824   ALT 68 (H) 05/13/2017 0824   ALKPHOS 247 (H) 05/13/2017 0824   BILITOT 0.67 05/13/2017 0824   GFRNONAA >60 03/16/2017 1721   GFRAA >60 03/16/2017 1721    No results found for: SPEP, UPEP  Lab Results  Component Value Date   WBC 8.7 05/13/2017   NEUTROABS 5.3 05/13/2017   HGB 13.0 05/13/2017   HCT 38.9 05/13/2017   MCV 84.7 05/13/2017   PLT 128 (L) 05/13/2017      Chemistry      Component Value Date/Time   NA 136 05/13/2017 0824   K 4.1 05/13/2017 0824   CL 102 03/16/2017 1721   CL 99 04/08/2013 0922   CO2 23 05/13/2017 0824   BUN 9.7 05/13/2017 0824   CREATININE 0.7 05/13/2017 0824      Component Value Date/Time   CALCIUM 9.6 05/13/2017 0824   ALKPHOS 247 (H) 05/13/2017 0824   AST 35 (H) 05/13/2017 0824   ALT 68 (H) 05/13/2017 0824   BILITOT 0.67 05/13/2017 0824       RADIOGRAPHIC STUDIES: I have reviewed imaging study with the patient and her husband I have personally reviewed the radiological images as listed and agreed with the findings in the report. Nm Pet Image Restag (ps) Skull Base To Thigh  Result Date: 05/13/2017 CLINICAL DATA:  Subsequent treatment strategy for chronic lymphocytic leukemia. EXAM: NUCLEAR MEDICINE PET SKULL BASE TO THIGH TECHNIQUE: 90 mCi F-18 FDG was injected intravenously. Full-ring PET imaging was performed from the skull base to thigh after the radiotracer. CT data was obtained and used for attenuation correction and anatomic localization. FASTING BLOOD GLUCOSE:  Value: 5.4 mg/dl COMPARISON:  Multiple exams, including 01/06/2017 FINDINGS: NECK There is evidence of chronic ischemic microvascular white matter disease. Minimal  chronic left maxillary sinusitis. Bilateral but left greater than right level IIb, III, IV, V, and supraclavicular fossa lymph nodes are present better not hypermetabolic. A left supraclavicular fossa lymph node measures 1 cm in short axis on image 46/4, formerly 0.8 cm, with maximum SUV 2.6. CHEST Bilateral enlarged axillary lymph nodes. Index right axillary lymph node 2.0 cm in short axis (formerly 1.7 cm) with maximum SUV  2.4 (formerly 1.3). A right paratracheal lymph node measuring 0.8 cm in short axis on image 65/4 (formerly 0.7 cm) has a maximum SUV of 4.3 (formerly 3.8). A right hilar lymph node which is difficult to measure with respect to size has a maximum SUV of 3.9 (formerly 4.2). There some mild mosaic attenuation in the lungs with appearance suggesting air trapping. Densely calcified posterior paraesophageal lymph node exert some mass effect on the esophagus. Coronary, aortic arch, and branch vessel atherosclerotic vascular disease. Right Port-A-Cath tip:  Cavoatrial junction. Background mediastinal blood pool activity:  2.4 ABDOMEN/PELVIS The spleen measures 9.4 by 5.9 by 9.5 cm (volume = 280 cm^3). No focal splenic lesion. Background hepatic activity SUV 3.0. Pathologic retroperitoneal adenopathy is observed. A reference aortocaval lymph node measures 2.1 cm in short axis on image 130/4 (formerly 1.7 cm by my measurements) and has maximum SUV 2.9 (formerly 1.6). There is some right mesenteric and pericecal adenopathy along with common iliac and external iliac adenopathy. A reference right external iliac node measures 1.9 cm in short axis on image 156/4 (formerly 1.5 cm) and has a maximum SUV of 2.8 (formerly 1.8). Aortoiliac atherosclerotic vascular disease. SKELETON No focal hypermetabolic activity to suggest skeletal metastasis. IMPRESSION: 1. Mild enlargement of nodal disease in the neck, chest, abdomen, and pelvis. Most of the nodes are slightly increased activity although the right hilar node  demonstrates the opposite. Current nodal disease is primarily Deauville 3 ; the right paratracheal and right hilar lymph nodes are Deauville 4. 2.  Aortic Atherosclerosis (ICD10-I70.0). 3. A large calcified paraesophageal lymph node exert some mass effect on the esophagus. 4. Minimal chronic left maxillary sinusitis. Electronically Signed   By: Van Clines M.D.   On: 05/13/2017 11:08    ASSESSMENT & PLAN:  CLL (chronic lymphocytic leukemia) PET CT scan show persistent residual disease She did not tolerate Zydelig well I think it is prudent to consider switching treatment in the near future She has lost some weight so I do not think she is ready to begin new treatment I recommend she starts prednisone 5 mg daily to help her gain some appetite, stabilize chronic sinusitis and for disease control for CLL She would take Zydelig at half a dose twice a day and I suspect she will finish her treatment by early August I plan to see her back at the time for further discussion about treatment options  Thrombocytopenia ([place]) She is not symptomatic apart from minor bruising. I will observe for now. This is likely due to recent treatment.   Elevated liver enzymes Her elevated liver enzymes are likely related to recent chemotherapy I recommend she resume treatment at half a dose along with prednisone therapy and a plan to recheck next month   No orders of the defined types were placed in this encounter.  All questions were answered. The patient knows to call the clinic with any problems, questions or concerns. No barriers to learning was detected. I spent 20 minutes counseling the patient face to face. The total time spent in the appointment was 25 minutes and more than 50% was on counseling and review of test results     Heath Lark, MD 05/15/2017 4:19 PM

## 2017-05-15 NOTE — Assessment & Plan Note (Signed)
She is not symptomatic apart from minor bruising. I will observe for now. This is likely due to recent treatment.

## 2017-06-11 ENCOUNTER — Other Ambulatory Visit: Payer: Self-pay | Admitting: Hematology and Oncology

## 2017-06-11 ENCOUNTER — Telehealth: Payer: Self-pay | Admitting: Hematology and Oncology

## 2017-06-11 ENCOUNTER — Ambulatory Visit (HOSPITAL_BASED_OUTPATIENT_CLINIC_OR_DEPARTMENT_OTHER): Payer: Medicare Other | Admitting: Hematology and Oncology

## 2017-06-11 ENCOUNTER — Other Ambulatory Visit (HOSPITAL_BASED_OUTPATIENT_CLINIC_OR_DEPARTMENT_OTHER): Payer: Medicare Other

## 2017-06-11 ENCOUNTER — Ambulatory Visit (HOSPITAL_BASED_OUTPATIENT_CLINIC_OR_DEPARTMENT_OTHER): Payer: Medicare Other

## 2017-06-11 VITALS — BP 158/53 | HR 80 | Temp 97.6°F | Resp 18 | Ht 60.0 in | Wt 103.4 lb

## 2017-06-11 DIAGNOSIS — D801 Nonfamilial hypogammaglobulinemia: Secondary | ICD-10-CM

## 2017-06-11 DIAGNOSIS — Z452 Encounter for adjustment and management of vascular access device: Secondary | ICD-10-CM | POA: Diagnosis not present

## 2017-06-11 DIAGNOSIS — R300 Dysuria: Secondary | ICD-10-CM

## 2017-06-11 DIAGNOSIS — D696 Thrombocytopenia, unspecified: Secondary | ICD-10-CM

## 2017-06-11 DIAGNOSIS — R748 Abnormal levels of other serum enzymes: Secondary | ICD-10-CM | POA: Diagnosis not present

## 2017-06-11 DIAGNOSIS — C9110 Chronic lymphocytic leukemia of B-cell type not having achieved remission: Secondary | ICD-10-CM

## 2017-06-11 LAB — URINALYSIS, MICROSCOPIC - CHCC
BILIRUBIN (URINE): NEGATIVE
Blood: NEGATIVE
Glucose: NEGATIVE mg/dL
KETONES: NEGATIVE mg/dL
NITRITE: NEGATIVE
PH: 6 (ref 4.6–8.0)
Protein: NEGATIVE mg/dL
RBC / HPF: NEGATIVE (ref 0–2)
Specific Gravity, Urine: 1.005 (ref 1.003–1.035)
Urobilinogen, UR: 0.2 mg/dL (ref 0.2–1)

## 2017-06-11 LAB — CBC WITH DIFFERENTIAL/PLATELET
BASO%: 0.7 % (ref 0.0–2.0)
BASOS ABS: 0.1 10*3/uL (ref 0.0–0.1)
EOS%: 1.3 % (ref 0.0–7.0)
Eosinophils Absolute: 0.1 10*3/uL (ref 0.0–0.5)
HCT: 39.7 % (ref 34.8–46.6)
HGB: 13.2 g/dL (ref 11.6–15.9)
LYMPH%: 46.7 % (ref 14.0–49.7)
MCH: 28.7 pg (ref 25.1–34.0)
MCHC: 33.2 g/dL (ref 31.5–36.0)
MCV: 86.3 fL (ref 79.5–101.0)
MONO#: 0.3 10*3/uL (ref 0.1–0.9)
MONO%: 3.9 % (ref 0.0–14.0)
NEUT#: 3.3 10*3/uL (ref 1.5–6.5)
NEUT%: 47.4 % (ref 38.4–76.8)
Platelets: 87 10*3/uL — ABNORMAL LOW (ref 145–400)
RBC: 4.6 10*6/uL (ref 3.70–5.45)
RDW: 15.5 % — ABNORMAL HIGH (ref 11.2–14.5)
WBC: 7 10*3/uL (ref 3.9–10.3)
lymph#: 3.3 10*3/uL (ref 0.9–3.3)

## 2017-06-11 LAB — COMPREHENSIVE METABOLIC PANEL
ALBUMIN: 4 g/dL (ref 3.5–5.0)
ALK PHOS: 81 U/L (ref 40–150)
ALT: 56 U/L — ABNORMAL HIGH (ref 0–55)
AST: 35 U/L — AB (ref 5–34)
Anion Gap: 7 mEq/L (ref 3–11)
BUN: 10.7 mg/dL (ref 7.0–26.0)
CHLORIDE: 104 meq/L (ref 98–109)
CO2: 28 meq/L (ref 22–29)
Calcium: 10.1 mg/dL (ref 8.4–10.4)
Creatinine: 0.8 mg/dL (ref 0.6–1.1)
EGFR: 72 mL/min/{1.73_m2} — ABNORMAL LOW (ref >90)
GLUCOSE: 108 mg/dL (ref 70–140)
POTASSIUM: 4.8 meq/L (ref 3.5–5.1)
Sodium: 138 mEq/L (ref 136–145)
Total Bilirubin: 0.45 mg/dL (ref 0.20–1.20)
Total Protein: 5.8 g/dL — ABNORMAL LOW (ref 6.4–8.3)

## 2017-06-11 MED ORDER — HEPARIN SOD (PORK) LOCK FLUSH 100 UNIT/ML IV SOLN
500.0000 [IU] | Freq: Once | INTRAVENOUS | Status: AC
  Administered 2017-06-11: 500 [IU]
  Filled 2017-06-11: qty 5

## 2017-06-11 MED ORDER — AMOXICILLIN 500 MG PO TABS: 500.0000 mg | ORAL_TABLET | Freq: Two times a day (BID) | ORAL | 0 refills | Status: DC

## 2017-06-11 MED ORDER — SODIUM CHLORIDE 0.9% FLUSH
10.0000 mL | Freq: Once | INTRAVENOUS | Status: AC
  Administered 2017-06-11: 10 mL
  Filled 2017-06-11: qty 10

## 2017-06-11 MED ORDER — ALTEPLASE 2 MG IJ SOLR
2.0000 mg | Freq: Once | INTRAMUSCULAR | Status: DC | PRN
  Filled 2017-06-11: qty 2

## 2017-06-11 MED ORDER — MIRTAZAPINE 15 MG PO TABS: 15.0000 mg | ORAL_TABLET | Freq: Every day | ORAL | 1 refills | Status: DC

## 2017-06-11 MED ORDER — PREDNISONE 10 MG PO TABS: 10.0000 mg | ORAL_TABLET | Freq: Every day | ORAL | 1 refills | Status: DC

## 2017-06-11 NOTE — Telephone Encounter (Signed)
Gave calendar to patient. Did not want avs.

## 2017-06-11 NOTE — Progress Notes (Signed)
Pt port flushed several times with no blood return. Labs were drawn peripherally. Desk nurse to Dr. Alvy Bimler was called to ask to administer cathflow. Per Alvy Bimler nurse, "cathflow does not have to be administered if pt is not getting treatment." Administered heparin and deaccessed pt. Porsche Cates LPN

## 2017-06-12 ENCOUNTER — Encounter: Payer: Self-pay | Admitting: Hematology and Oncology

## 2017-06-12 NOTE — Assessment & Plan Note (Signed)
Her elevated liver enzymes are likely related to recent chemotherapy I recommend she resume treatment at half a dose along with prednisone therapy and a plan to recheck again in her next visit

## 2017-06-12 NOTE — Progress Notes (Signed)
[facility] progress notes  Patient Care Team: Shon Baton, MD as PCP - General (Internal Medicine) Heath Lark, MD as Consulting Physician (Hematology and Oncology) Tat, Eustace Quail, DO as Consulting Physician (Neurology)  CHIEF COMPLAINTS/PURPOSE OF VISIT:  CLL  HISTORY OF PRESENTING ILLNESS:  Angelica Sullivan 83 y.o. female returns for follow-up The patient has recently relocated to [facility] but drove all the way to keep her appointment today. She complain of dysuria and urinary frequency She continues to have chronic sinus congestion She denies fever or chills.  She tolerated reduced dose chemotherapy along with steroid well.  Apart from minor bruising, she denies bleeding complication from thrombocytopenia. The patient denies any recent signs or symptoms of bleeding such as spontaneous epistaxis, hematuria or hematochezia. She complain of fatigue She denies new lymphadenopathy Summary of her history is as follows: Oncology History   CLL stage IV, deletion 13q     CLL (chronic lymphocytic leukemia) ([place])   12/12/2013 Bone Marrow Biopsy    Bone marrow biopsy show significant involvement of CLL      12/13/2013 Procedure    Patient had placement of a port      12/14/2013 Imaging    CT scan of the chest, abdomen and pelvis showed significant lymphadenopathy and splenomegaly      12/20/2013 - 12/22/2013 Hospital Admission    The patient was admitted to the hospital to begin cycle 1 day 1 of chemotherapy due to risk of infusion reaction. She tolerated treatment well without major side effects.      12/20/2013 - 03/13/2014 Chemotherapy    started on cycle 1 of Obinutuzumab, chlorambucil and prednisone. All treatment placed on hold on 03/13/2014 due to persistent pancytopenia.      03/28/2014 Imaging    Repeat imaging study showed excellent response to treatment.      10/25/2014 Imaging    She has persistent mild lymphadenopathy and splenomegaly, improved  compared to previous exam      04/23/2015 Imaging    Ct scan showed disease progression      05/09/2015 - 11/26/2015 Chemotherapy    She started taking ibrutinib.      11/26/2015 Adverse Reaction    Treatment  was placed on hold due to neutropenia and recent infection      02/11/2016 - 10/10/2016 Chemotherapy    She is started on monthly IVIG Rx for recurrent infection and acquired panhypogammaglobulinemia      11/17/2016 -  Chemotherapy    The patient is started on Rituximab. Idelisib was started on 2/1      01/06/2017 PET scan    Significant partial treatment response. Persistent mild hypermetabolism within solitary right paratracheal and solitary right hilar lymph nodes, which both demonstrate decreased metabolism. Otherwise complete metabolic response. Residual mildly enlarged bilateral neck, axillary, retroperitoneal and pelvic lymph nodes have all decreased in size and demonstrate no hypermetabolism. 2. Additional findings include aortic atherosclerosis, three-vessel coronary atherosclerosis and mosaic attenuation in both lungs most commonly due to air trapping from small airways disease      01/12/2017 Adverse Reaction    Idelisib is placed on hold due to neutropenia and resumed at reduced dose      05/13/2017 PET scan    1. Mild enlargement of nodal disease in the neck, chest, abdomen, and pelvis. Most of the nodes are slightly increased activity although the right hilar node demonstrates the opposite. Current nodal disease is primarily Deauville 3 ; the right paratracheal and right hilar lymph  nodes are Deauville 4. 2. Aortic Atherosclerosis (ICD10-I70.0). 3. A large calcified paraesophageal lymph node exert some mass effect on the esophagus. 4. Minimal chronic left maxillary sinusitis.       MEDICAL HISTORY:  Past Medical History:  Diagnosis Date  . Confusion 07/03/2015  . GERD (gastroesophageal reflux disease)   . Hyperlipidemia   . Hypertension   .  Hypogammaglobulinemia, acquired (HCC) 02/05/2016  . Irritable bowel   . Leukemia (HCC) 08/06/09   CLL  . Leukemia, chronic lymphoid (HCC) 09/15/2011  . Leukopenia 01/16/2014  . Osteoporosis   . Rhinitis, chronic 09/15/2011  . Thrombocytopenia (HCC) 09/15/2011  . TIA (transient ischemic attack) 07/05/2015    SURGICAL HISTORY: Past Surgical History:  Procedure Laterality Date  . ANTERIOR AND POSTERIOR VAGINAL REPAIR    . ANTERIOR AND POSTERIOR VAGINAL REPAIR     repeat  . broncoscopy    . HEMORRHOID SURGERY    . LAPAROSCOPIC CHOLECYSTECTOMY    . TONSILLECTOMY AND ADENOIDECTOMY    . VAGINAL HYSTERECTOMY      SOCIAL HISTORY: Social History   Social History  . Marital status: Married    Spouse name: N/A  . Number of children: N/A  . Years of education: N/A   Occupational History  . Not on file.   Social History Main Topics  . Smoking status: Never Smoker  . Smokeless tobacco: Never Used  . Alcohol use No  . Drug use: No  . Sexual activity: No   Other Topics Concern  . Not on file   Social History Narrative  . No narrative on file    FAMILY HISTORY: Family History  Problem Relation Age of Onset  . Pneumonia Father     ALLERGIES:  is allergic to bactrim [sulfamethoxazole w/trimethoprim (co-trimoxazole)].  MEDICATIONS:  Current Outpatient Prescriptions  Medication Sig Dispense Refill  . acyclovir (ZOVIRAX) 400 MG tablet Take 1 tablet (400 mg total) by mouth daily. 90 tablet 3  . amoxicillin (AMOXIL) 500 MG tablet Take 1 tablet (500 mg total) by mouth 2 (two) times daily. 14 tablet 0  . calcium citrate-vitamin D (CITRACAL+D) 315-200 MG-UNIT per tablet Take 1 tablet by mouth daily.     . cholecalciferol (VITAMIN D) 1000 units tablet Take 1,000 Units by mouth daily.    . diphenhydrAMINE (BENADRYL) 25 mg capsule Take 50 mg by mouth every 8 (eight) hours as needed for itching or allergies.     . diphenoxylate-atropine (LOMOTIL) 2.5-0.025 MG tablet Take 1 tablet by  mouth 4 (four) times daily as needed for diarrhea or loose stools. 60 tablet 0  . ESTRACE VAGINAL 0.1 MG/GM vaginal cream Place 1 Applicatorful vaginally 2 (two) times a week.     . folic acid (FOLVITE) 1 MG tablet Take 1 mg by mouth daily.    . glucosamine-chondroitin 500-400 MG tablet Take 1 tablet by mouth daily.      . idelalisib (ZYDELIG) 150 MG tablet Take 1 tablet (150 mg total) by mouth 2 (two) times daily. (Patient not taking: Reported on 04/06/2017) 60 tablet 11  . levETIRAcetam (KEPPRA XR) 500 MG 24 hr tablet TAKE 2 TABLETS BY MOUTH  DAILY (Patient taking differently: TAKE 2 TABLETS BY MOUTH  at night) 180 tablet 1  . lidocaine-prilocaine (EMLA) cream Apply 1 application topically as needed. (Patient taking differently: Apply 1 application topically as needed (for port access). ) 30 g 6  . loratadine (CLARITIN) 10 MG tablet Take 10 mg by mouth daily.      .   mirtazapine (REMERON) 15 MG tablet Take 1 tablet (15 mg total) by mouth at bedtime. 30 tablet 1  . montelukast (SINGULAIR) 10 MG tablet Take 10 mg by mouth daily.     . Multiple Vitamin (MULTIVITAMIN) tablet Take 1 tablet by mouth daily.      . ondansetron (ZOFRAN) 8 MG tablet Take 1 tablet (8 mg total) by mouth every 8 (eight) hours as needed for nausea. 30 tablet 3  . polyvinyl alcohol (ARTIFICIAL TEARS) 1.4 % ophthalmic solution Place 1 drop into both eyes 3 (three) times daily. Reported on 03/10/2016    . predniSONE (DELTASONE) 10 MG tablet Take 1 tablet (10 mg total) by mouth daily with breakfast. 30 tablet 1  . sodium chloride (OCEAN) 0.65 % SOLN nasal spray Place 1 spray into both nostrils 2 (two) times daily.     Marland Kitchen zolpidem (AMBIEN) 10 MG tablet Take 5 mg by mouth as needed for sleep.     No current facility-administered medications for this visit.     REVIEW OF SYSTEMS:   Constitutional: Denies fevers, chills or abnormal night sweats Eyes: Denies blurriness of vision, double vision or watery eyes Ears, nose, mouth, throat,  and face: Denies mucositis or sore throat Respiratory: Denies cough, dyspnea or wheezes Cardiovascular: Denies palpitation, chest discomfort or lower extremity swelling Gastrointestinal:  Denies nausea, heartburn or change in bowel habits Skin: Denies abnormal skin rashes Lymphatics: Denies new lymphadenopathy  Neurological:Denies numbness, tingling or new weaknesses Behavioral/Psych: Mood is stable, no new changes  All other systems were reviewed with the patient and are negative.  PHYSICAL EXAMINATION: ECOG PERFORMANCE STATUS: 2 - Symptomatic, <50% confined to bed  Vitals:   06/11/17 1319  BP: (!) 158/53  Pulse: 80  Resp: 18  Temp: 97.6 F (36.4 C)  SpO2: 100%   Filed Weights   06/11/17 1319  Weight: 103 lb 6.4 oz (46.9 kg)    GENERAL:alert, no distress and comfortable SKIN: Noted skin bruising. EYES: normal, conjunctiva are pink and non-injected, sclera clear OROPHARYNX:no exudate, normal lips, buccal mucosa, and tongue  NECK: supple, thyroid normal size, non-tender, without nodularity LYMPH:  no palpable lymphadenopathy in the cervical, axillary or inguinal LUNGS: clear to auscultation and percussion with normal breathing effort HEART: regular rate & rhythm and no murmurs without lower extremity edema ABDOMEN:abdomen soft, non-tender and normal bowel sounds Musculoskeletal:no cyanosis of digits and no clubbing  PSYCH: alert & oriented x 3 with fluent speech NEURO: no focal motor/sensory deficits  LABORATORY DATA:  I have reviewed the data as listed Lab Results  Component Value Date   WBC 7.0 06/11/2017   HGB 13.2 06/11/2017   HCT 39.7 06/11/2017   MCV 86.3 06/11/2017   PLT 87 (L) 06/11/2017    Recent Labs  03/13/17 1309 03/16/17 1721  04/22/17 1132 05/13/17 0824 06/11/17 1256  NA 130* 133*  < > 137 136 138  K 3.8 3.8  < > 4.8 4.1 4.8  CL 99* 102  --   --   --   --   CO2 23 23  < > _0 GLUCOSE 103* 87  < > 104 100 108  BUN 17 8  < > 8.2 9.7 10.7   CREATININE 0.79 0.59  < > 0.8 0.7 0.8  CALCIUM 9.0 9.0  < > 9.5 9.6 10.1  GFRNONAA >60 >60  --   --   --   --   GFRAA >60 >60  --   --   --   --  PROT 5.7* 5.2*  < > 5.8* 5.3* 5.8*  ALBUMIN 3.5 3.1*  < > 3.7 3.6 4.0  AST 28 31  < > 86* 35* 35*  ALT 71* 56*  < > 126* 68* 56*  ALKPHOS 64 62  < > 71 247* 81  BILITOT 1.1 0.7  < > 0.42 0.67 0.45  < > = values in this interval not displayed. ASSESSMENT & PLAN:  CLL (chronic lymphocytic leukemia) She tolerated reduced dose steroids along with Zydelig well She has mild pancytopenia with abnormal liver enzymes but overall stable We have discussed previously to consider changing her treatment to different regimen However, the patient has recently relocated to [place] She will return here in 10 days for another appointment and I will see her again for repeat blood work I will get referral sent to [facility] for her to be followed locally in the future  Thrombocytopenia (HCC) She is not symptomatic apart from minor bruising. I will observe for now. This is likely due to recent treatment.   Dysuria Her urinalysis is suspicious for urinary tract infection I would proceed to prescribe antibiotic treatment Urine culture is pending and I will call her with test results  Elevated liver enzymes Her elevated liver enzymes are likely related to recent chemotherapy I recommend she resume treatment at half a dose along with prednisone therapy and a plan to recheck again in her next visit   Orders Placed This Encounter  Procedures  . Urine Culture    Standing Status:   Future    Number of Occurrences:   1    Standing Expiration Date:   07/16/2018  . Urinalysis, Microscopic - [facility]    Standing Status:   Future    Number of Occurrences:   1    Standing Expiration Date:   07/16/2018    All questions were answered. The patient knows to call the clinic with any problems, questions or concerns. I spent 25 minutes counseling the patient face to  face. The total time spent in the appointment was 30 minutes and more than 50% was on counseling.     Ni Gorsuch, MD 06/12/2017 5:08 PM    

## 2017-06-12 NOTE — Assessment & Plan Note (Signed)
Her urinalysis is suspicious for urinary tract infection I would proceed to prescribe antibiotic treatment Urine culture is pending and I will call her with test results

## 2017-06-12 NOTE — Assessment & Plan Note (Signed)
She tolerated reduced dose steroids along with Zydelig well She has mild pancytopenia with abnormal liver enzymes but overall stable We have discussed previously to consider changing her treatment to different regimen However, the patient has recently relocated to [facility] [address] She will return here in 10 days for another appointment and I will see her again for repeat blood work I will get referral sent to [facility] for her to be followed locally in the future

## 2017-06-12 NOTE — Assessment & Plan Note (Signed)
She is not symptomatic apart from minor bruising. I will observe for now. This is likely due to recent treatment.

## 2017-06-15 ENCOUNTER — Telehealth: Payer: Self-pay

## 2017-06-15 LAB — URINE CULTURE

## 2017-06-15 NOTE — Telephone Encounter (Signed)
Patient called back and left message to call her. Called patient back. States that she is doing well, with no problems.

## 2017-06-15 NOTE — Telephone Encounter (Signed)
-----   Message from Heath Lark, MD sent at 06/15/2017  3:48 PM EDT ----- Regarding: UTI Urine culture is sensitive to cipro Can you call how she is? I gave her the script last week ----- Message ----- From: Interface, Lab In Three Zero One Sent: 06/11/2017   1:21 PM To: Heath Lark, MD

## 2017-06-15 NOTE — Telephone Encounter (Signed)
Referral called to [facility] Hematology/ Oncology.

## 2017-06-15 NOTE — Telephone Encounter (Signed)
Called and left message regarding below message. Asking patient how she is doing with her UTI? Ask her to call the nurse back.

## 2017-06-15 NOTE — Telephone Encounter (Signed)
-----   Message from Heath Lark, MD sent at 06/12/2017  5:09 PM EDT ----- Regarding: referral to [facility] in [place], [place] Please refer her to department of oncology/hematology to establish care for CLL

## 2017-06-16 ENCOUNTER — Telehealth: Payer: Self-pay | Admitting: Hematology and Oncology

## 2017-06-16 ENCOUNTER — Other Ambulatory Visit: Payer: Self-pay | Admitting: Hematology and Oncology

## 2017-06-16 NOTE — Telephone Encounter (Signed)
Faxed records to [facility] [phone]

## 2017-06-17 ENCOUNTER — Telehealth: Payer: Self-pay | Admitting: *Deleted

## 2017-06-17 NOTE — Telephone Encounter (Signed)
"  Angelica Sullivan [phone] with [facility]. Received referral with records from Dr. Alvy Bimler however there is a gap from 2010 through 2015 in her care.  Was she under observation during this time?  We need more information/records.  There are CT scan image reports needed for 12-14-2013 and 03-28-2014.  Please fax any 2010 to 2015 notes and image reports to [phone]."  Shared information within provider encounter notes from 09-15-2011 through 08-24-2013 'no treatment needed, Current Therapy: Observation'.  Angelica Sullivan request these along with 11-08-2013 first encounter with Dr.Gorsuch.  Records faxed as requested.

## 2017-06-19 NOTE — Progress Notes (Signed)
Angelica Sullivan was seen today in neurologic consultation at the request of Shon Baton, MD.  The patient is accompanied by her husband who supplements the history.  The patient experienced a fall on August 25; she had been outside cleaning in the heat and was going up an incline and she fell and she hit her head and hit her tailbone.  No LOC.  Her husband helped her up.  A few days later, she went to take a shower and her husband noted she didn't follow the normal routine (she didn't get her shower cap, towel, etc).  When she went to get out of the shower she grabbed a towel but asked her husband where the towel was.  She then walked over to a stack of towels and pulled the phone on top of her foot.  She felt a bit confused but her husband states that was the "end" of the event. Her speech was clear.  Just a few days later on August 29, she had just got up from her nap.   She told her husband that she didn't feel well (nausea).  She got up and before she even got into the bedroom door, she had pulled down her pants to go to the bathroom even though she was no where near the bathroom.  Her husband then heard a thump and she was in her closet and sat on the floor with pants down.  He asked her if she fell and she said "no, I have to use the bathroom."  She urinated on the floor and she asked for toilet paper.  She was then back to normal.  She states today that she remembers the entire event but her husband states that she didn't remember it until he relayed the story a few times.  No history of similar previously.  No events since that time.  That same day she had a CT of the head without contrast which I reviewed.  There was nothing acute on this examination.  There was atrophy and small vessel disease.  Her chemotherapy dose was subsequently decreased.  She was sent here for evaluation of possible TIA.  She had an EKG that was unremarkable for atrial fibrillation.  08/09/15 update:  The patient returns today  for follow-up, accompanied by her husband who supplements the history.  The patient had an MRI of the brain since last visit on 07/19/2015.  There is evidence of very significant cerebral small vessel disease, but it was otherwise unremarkable and nonacute.  She had a routine EEG, followed by a 24 hour ambulatory EEG.  Both of these demonstrated evidence of left temporal slowing, without evidence of epileptiform activity.  The patient has had no further events since our last visit.  12/11/15 update:  The patient follows up today, accompanied by her husband who supplements the history.  I have reviewed prior records made available to me.  Last visit, I started the patient on Keppra for what sounded like complex partial seizures.  She is on 500 mg twice a day.  She has had no further events.  She has complained of fatigue and thinks that it could be due to the [place].  States that she initially felt that things were "not clear" and not cognitively right but she feels better now.   However, she has been neutropenic and recently chemotherapy was held because of this.  She states that she also had the flu and had an "infection" of the bone marrow.  05/23/16 update:  The patient follows up today, accompanied by her husband who supplements the history.  I have reviewed prior records made available to me.  She is now on Keppra XR, 500 mg, 2 tablets at night.  This was changed because the patient complained of fatigue, but I told her I doubted that was from [place].  She does think she is doing better with this but she has had no further spells.   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  She has been struggling with skin rash and dry skin.  She has been moisturizing with vaseline and that has helped some.  She saw derm for the rash but the med was $650 for 8oz tube so she didn't use it.  Planning to have cataract surgery in [facility] in mid august.  11/24/16 update:  Pt f/u today,  accompanied by her husband who supplements the history.  The records that were made available to me were reviewed.  Still on keppra XR, 500 mg, 2 tablet at night. No further episodes of alteration in awareness.  Now on Rituxan/Idelalisib combo treatment for her CLL.  Only started the rituxan part as awaiting prior auth for idelalisib.  Overall feels well.      06/22/17 update:  Patient seen today in follow-up for probable seizure, accompanied by her husband who supplements the history.  I have reviewed records since last visit.  She is on Keppra XR, 500 mg, 2 tablets daily.  She is doing well and sleeping well and no further spells since being on this medication. She has recently moved to [facility] and is transferring her care there.  They are still building the home in [place] in a retirement community.  They are currently living in an apartment with their daughter in [place].     ALLERGIES:   Allergies  Allergen Reactions  . Bactrim [Sulfamethoxazole W/Trimethoprim (Co-Trimoxazole)] Diarrhea and Other (See Comments)    Abdominal cramping    CURRENT MEDICATIONS:  Outpatient Encounter Prescriptions as of 06/22/2017  Medication Sig  . acyclovir (ZOVIRAX) 400 MG tablet Take 1 tablet (400 mg total) by mouth daily.  Marland Kitchen amoxicillin (AMOXIL) 500 MG tablet Take 1 tablet (500 mg total) by mouth 2 (two) times daily.  . calcium citrate-vitamin D (CITRACAL+D) 315-200 MG-UNIT per tablet Take 1 tablet by mouth daily.   . cholecalciferol (VITAMIN D) 1000 units tablet Take 1,000 Units by mouth daily.  . diphenhydrAMINE (BENADRYL) 25 mg capsule Take 50 mg by mouth every 8 (eight) hours as needed for itching or allergies.   . diphenoxylate-atropine (LOMOTIL) 2.5-0.025 MG tablet Take 1 tablet by mouth 4 (four) times daily as needed for diarrhea or loose stools.  Marland Kitchen ESTRACE VAGINAL 0.1 MG/GM vaginal cream Place 1 Applicatorful vaginally 2 (two) times a week.   . folic acid (FOLVITE) 1 MG tablet Take 1 mg by mouth  daily.  Marland Kitchen glucosamine-chondroitin 500-400 MG tablet Take 1 tablet by mouth daily.    . idelalisib (ZYDELIG) 150 MG tablet Take 1 tablet (150 mg total) by mouth 2 (two) times daily. (Patient taking differently: Take 75 mg by mouth 2 (two) times daily. )  . levETIRAcetam (KEPPRA XR) 500 MG 24 hr tablet TAKE 2 TABLETS BY MOUTH  DAILY (Patient taking differently: TAKE 2 TABLETS BY MOUTH  at night)  . lidocaine-prilocaine (EMLA) cream Apply 1 application topically as needed. (Patient taking differently: Apply 1 application topically as needed (for port access). )  . loratadine (  CLARITIN) 10 MG tablet Take 10 mg by mouth daily.    . mirtazapine (REMERON) 15 MG tablet Take 1 tablet (15 mg total) by mouth at bedtime.  . montelukast (SINGULAIR) 10 MG tablet Take 10 mg by mouth daily.   . Multiple Vitamin (MULTIVITAMIN) tablet Take 1 tablet by mouth daily.    . ondansetron (ZOFRAN) 8 MG tablet Take 1 tablet (8 mg total) by mouth every 8 (eight) hours as needed for nausea.  . polyvinyl alcohol (ARTIFICIAL TEARS) 1.4 % ophthalmic solution Place 1 drop into both eyes 3 (three) times daily. Reported on 03/10/2016  . predniSONE (DELTASONE) 10 MG tablet Take 1 tablet (10 mg total) by mouth daily with breakfast.  . sodium chloride (OCEAN) 0.65 % SOLN nasal spray Place 1 spray into both nostrils 2 (two) times daily.   . [DISCONTINUED] zolpidem (AMBIEN) 10 MG tablet Take 5 mg by mouth as needed for sleep.   No facility-administered encounter medications on file as of 06/22/2017.     PAST MEDICAL HISTORY:   Past Medical History:  Diagnosis Date  . Confusion 07/03/2015  . GERD (gastroesophageal reflux disease)   . Hyperlipidemia   . Hypertension   . Hypogammaglobulinemia, acquired ([place]) 02/05/2016  . Irritable bowel   . Leukemia ([place]) 08/06/09   CLL  . Leukemia, chronic lymphoid ([place]) 09/15/2011  . Leukopenia 01/16/2014  . Osteoporosis   . Rhinitis, chronic 09/15/2011  . Thrombocytopenia ([place]) 09/15/2011  .  TIA (transient ischemic attack) 07/05/2015    PAST SURGICAL HISTORY:   Past Surgical History:  Procedure Laterality Date  . ANTERIOR AND POSTERIOR VAGINAL REPAIR    . ANTERIOR AND POSTERIOR VAGINAL REPAIR     repeat  . broncoscopy    . HEMORRHOID SURGERY    . LAPAROSCOPIC CHOLECYSTECTOMY    . TONSILLECTOMY AND ADENOIDECTOMY    . VAGINAL HYSTERECTOMY      SOCIAL HISTORY:   Social History   Social History  . Marital status: Married    Spouse name: N/A  . Number of children: N/A  . Years of education: N/A   Occupational History  . Not on file.   Social History Main Topics  . Smoking status: Never Smoker  . Smokeless tobacco: Never Used  . Alcohol use No  . Drug use: No  . Sexual activity: No   Other Topics Concern  . Not on file   Social History Narrative  . No narrative on file    FAMILY HISTORY:   Family Status  Relation Status  . Mother Deceased       pneumonia  . Father Deceased       unknown  . Sister Alive       lung cancer, mitral valve  . Sister Alive       healthy  . Brother Deceased at age 38  . Brother Deceased       memory issues    ROS:  C/o weight loss.  A complete 10 system review of systems was obtained and was unremarkable apart from what is mentioned above.  PHYSICAL EXAMINATION:    VITALS:   Vitals:   06/22/17 1058  BP: (!) 108/58  Pulse: 82  SpO2: 97%  Weight: 105 lb (47.6 kg)  Height: 5' (1.524 m)   Wt Readings from Last 3 Encounters:  06/22/17 105 lb (47.6 kg)  06/11/17 103 lb 6.4 oz (46.9 kg)  05/14/17 109 lb 9.6 oz (49.7 kg)     GEN:  Normal appears female  in no acute distress.  Appears stated age. HEENT:  Normocephalic.  The mucous membranes are moist. The superficial temporal arteries are without ropiness or tenderness. Cardiovascular: Regular rate and rhythm. Lungs: Clear to auscultation bilaterally. Neck/Heme: There are no carotid bruits noted bilaterally.  NEUROLOGICAL: Orientation:  The patient is alert and  oriented x 3.  Looks to her husband to answer detailed questions Cranial nerves: There is good facial symmetry.  Speech is fluent and clear. Soft palate rises symmetrically and there is no tongue deviation. Hearing is intact to conversational tone. Tone: Tone is good throughout. Sensation: Sensation is intact to light touch  Coordination:  The patient has no difficulty with RAM's or FNF bilaterally. DTR's:  2/4 at the bilateral biceps, triceps, brachioradialis and patella.   Motor: Strength is 5/5 in the bilateral upper and lower extremities.  Shoulder shrug is equal and symmetric. There is no pronator drift.  There are no fasciculations noted. Gait and Station: The patient is able to ambulate without difficulty.     IMPRESSION/PLAN  1.  Transient alteration of awareness  -The patient has now had 2 events that are suspicious for complex partial seizures and has had no further episodes since starting keppra  -continue Keppra XR 500 mg - 2 po daily.  No events since starting this and would recommend continuing since no SE either. 2. They have moved to [place] and will continue care there.  Much greater than 50% of this visit was spent in counseling and coordinating care.  Total face to face time:  20 min

## 2017-06-22 ENCOUNTER — Telehealth: Payer: Self-pay | Admitting: Hematology and Oncology

## 2017-06-22 ENCOUNTER — Encounter: Payer: Self-pay | Admitting: Neurology

## 2017-06-22 ENCOUNTER — Ambulatory Visit (HOSPITAL_BASED_OUTPATIENT_CLINIC_OR_DEPARTMENT_OTHER): Payer: Medicare Other

## 2017-06-22 ENCOUNTER — Ambulatory Visit (HOSPITAL_BASED_OUTPATIENT_CLINIC_OR_DEPARTMENT_OTHER): Payer: Medicare Other | Admitting: Hematology and Oncology

## 2017-06-22 ENCOUNTER — Ambulatory Visit (INDEPENDENT_AMBULATORY_CARE_PROVIDER_SITE_OTHER): Payer: Medicare Other | Admitting: Neurology

## 2017-06-22 ENCOUNTER — Other Ambulatory Visit (HOSPITAL_BASED_OUTPATIENT_CLINIC_OR_DEPARTMENT_OTHER): Payer: Medicare Other

## 2017-06-22 ENCOUNTER — Telehealth: Payer: Self-pay | Admitting: *Deleted

## 2017-06-22 VITALS — BP 108/58 | HR 82 | Ht 60.0 in | Wt 105.0 lb

## 2017-06-22 DIAGNOSIS — R748 Abnormal levels of other serum enzymes: Secondary | ICD-10-CM | POA: Diagnosis not present

## 2017-06-22 DIAGNOSIS — R404 Transient alteration of awareness: Secondary | ICD-10-CM

## 2017-06-22 DIAGNOSIS — G40209 Localization-related (focal) (partial) symptomatic epilepsy and epileptic syndromes with complex partial seizures, not intractable, without status epilepticus: Secondary | ICD-10-CM

## 2017-06-22 DIAGNOSIS — C9110 Chronic lymphocytic leukemia of B-cell type not having achieved remission: Secondary | ICD-10-CM

## 2017-06-22 DIAGNOSIS — Z452 Encounter for adjustment and management of vascular access device: Secondary | ICD-10-CM

## 2017-06-22 DIAGNOSIS — D696 Thrombocytopenia, unspecified: Secondary | ICD-10-CM

## 2017-06-22 DIAGNOSIS — D801 Nonfamilial hypogammaglobulinemia: Secondary | ICD-10-CM

## 2017-06-22 LAB — CBC WITH DIFFERENTIAL/PLATELET
BASO%: 0.3 % (ref 0.0–2.0)
Basophils Absolute: 0 10*3/uL (ref 0.0–0.1)
EOS%: 0.6 % (ref 0.0–7.0)
Eosinophils Absolute: 0 10*3/uL (ref 0.0–0.5)
HCT: 41.6 % (ref 34.8–46.6)
HEMOGLOBIN: 13.7 g/dL (ref 11.6–15.9)
LYMPH%: 61.3 % — AB (ref 14.0–49.7)
MCH: 28.7 pg (ref 25.1–34.0)
MCHC: 32.9 g/dL (ref 31.5–36.0)
MCV: 87.2 fL (ref 79.5–101.0)
MONO#: 0.3 10*3/uL (ref 0.1–0.9)
MONO%: 3.9 % (ref 0.0–14.0)
NEUT%: 33.9 % — ABNORMAL LOW (ref 38.4–76.8)
NEUTROS ABS: 2.4 10*3/uL (ref 1.5–6.5)
Platelets: 108 10*3/uL — ABNORMAL LOW (ref 145–400)
RBC: 4.77 10*6/uL (ref 3.70–5.45)
RDW: 15.8 % — AB (ref 11.2–14.5)
WBC: 7 10*3/uL (ref 3.9–10.3)
lymph#: 4.3 10*3/uL — ABNORMAL HIGH (ref 0.9–3.3)

## 2017-06-22 LAB — COMPREHENSIVE METABOLIC PANEL
ALBUMIN: 3.9 g/dL (ref 3.5–5.0)
ALT: 42 U/L (ref 0–55)
AST: 29 U/L (ref 5–34)
Alkaline Phosphatase: 64 U/L (ref 40–150)
Anion Gap: 5 mEq/L (ref 3–11)
BUN: 15.2 mg/dL (ref 7.0–26.0)
CHLORIDE: 103 meq/L (ref 98–109)
CO2: 30 mEq/L — ABNORMAL HIGH (ref 22–29)
Calcium: 10.2 mg/dL (ref 8.4–10.4)
Creatinine: 0.9 mg/dL (ref 0.6–1.1)
EGFR: 62 mL/min/{1.73_m2} — ABNORMAL LOW (ref >90)
GLUCOSE: 102 mg/dL (ref 70–140)
Potassium: 4.9 mEq/L (ref 3.5–5.1)
SODIUM: 138 meq/L (ref 136–145)
Total Bilirubin: 0.48 mg/dL (ref 0.20–1.20)
Total Protein: 5.7 g/dL — ABNORMAL LOW (ref 6.4–8.3)

## 2017-06-22 MED ORDER — HEPARIN SOD (PORK) LOCK FLUSH 100 UNIT/ML IV SOLN
500.0000 [IU] | Freq: Once | INTRAVENOUS | Status: AC
  Administered 2017-06-22: 500 [IU]
  Filled 2017-06-22: qty 5

## 2017-06-22 MED ORDER — ALTEPLASE 2 MG IJ SOLR
2.0000 mg | Freq: Once | INTRAMUSCULAR | Status: AC | PRN
  Administered 2017-06-22: 2 mg
  Filled 2017-06-22: qty 2

## 2017-06-22 MED ORDER — SODIUM CHLORIDE 0.9% FLUSH
10.0000 mL | Freq: Once | INTRAVENOUS | Status: AC
  Administered 2017-06-22: 10 mL
  Filled 2017-06-22: qty 10

## 2017-06-22 NOTE — Telephone Encounter (Signed)
Notified of results below 

## 2017-06-22 NOTE — Telephone Encounter (Signed)
Per 8/20 los - no additional appt added. - Return for No new orders.

## 2017-06-22 NOTE — Telephone Encounter (Signed)
-----   Message from Heath Lark, MD sent at 06/22/2017  3:42 PM EDT ----- Regarding: labs Pls call her: platelets are better. Kidney and liver tests are stable Continue the same dose prednisone and Idelisib until she sees her new doctor. They may decide to switch her Rx or repeat scans soon but I will defer to her new doctor ----- Message ----- From: Interface, Lab In Three Zero One Sent: 06/22/2017   2:35 PM To: Heath Lark, MD

## 2017-06-22 NOTE — Progress Notes (Signed)
R Port-A-Cath accessed, site flushes well with no swelling or pain noted upon flushing with NS. Pt stated that the last time she was here, they did not get blood return. Cath-Flo instilled per policy...site marked. Will monitor for blood return.

## 2017-06-23 ENCOUNTER — Encounter: Payer: Self-pay | Admitting: Hematology and Oncology

## 2017-06-23 NOTE — Assessment & Plan Note (Signed)
Unfortunately, the patient is in the transition of care and had relocated to [place]. CBC today show mild lymphocytosis, could be due to disease progression She has an appointment to establish care in [place] this week I have requested the patient to stay on low-dose prednisone along with Idelisib She may need repeat imaging study again before starting her on new treatment Alternatively, she can be observed closely until there is a need to restart treatment Her CBC show mild thrombocytopenia but overall she is not symptomatic except for bruising which I think is related to prednisone. Previously, she have responded well to anti-CD20 treatment It is conceivable she can be retreated with rituximab or Obinutuzumab again in the future but I would defer to her new oncologist to discuss this with her

## 2017-06-23 NOTE — Assessment & Plan Note (Signed)
Her recent elevated liver enzymes are likely related to recent chemotherapy Repeat blood work showed it has improved She will continue low-dose chemotherapy until she is established with new oncologist

## 2017-06-23 NOTE — Assessment & Plan Note (Addendum)
She is not symptomatic apart from minor bruising. I will observe for now. This is likely due to recent treatment and her CLL

## 2017-06-23 NOTE — Progress Notes (Signed)
[facility] OFFICE PROGRESS NOTE  Patient Care Team: Shon Baton, MD as PCP - General (Internal Medicine) Heath Lark, MD as Consulting Physician (Hematology and Oncology) Tat, Eustace Quail, DO as Consulting Physician (Neurology)  SUMMARY OF ONCOLOGIC HISTORY: Oncology History   CLL stage IV, deletion 13q     CLL (chronic lymphocytic leukemia) ([place])   12/12/2013 Bone Marrow Biopsy    Bone marrow biopsy show significant involvement of CLL      12/13/2013 Procedure    Patient had placement of a port      12/14/2013 Imaging    CT scan of the chest, abdomen and pelvis showed significant lymphadenopathy and splenomegaly      12/20/2013 - 12/22/2013 Hospital Admission    The patient was admitted to the hospital to begin cycle 1 day 1 of chemotherapy due to risk of infusion reaction. She tolerated treatment well without major side effects.      12/20/2013 - 03/13/2014 Chemotherapy    started on cycle 1 of Obinutuzumab, chlorambucil and prednisone. All treatment placed on hold on 03/13/2014 due to persistent pancytopenia.      03/28/2014 Imaging    Repeat imaging study showed excellent response to treatment.      10/25/2014 Imaging    She has persistent mild lymphadenopathy and splenomegaly, improved compared to previous exam      04/23/2015 Imaging    Ct scan showed disease progression      05/09/2015 - 11/26/2015 Chemotherapy    She started taking ibrutinib.      11/26/2015 Adverse Reaction    Treatment  was placed on hold due to neutropenia and recent infection      02/11/2016 - 10/10/2016 Chemotherapy    She is started on monthly IVIG Rx for recurrent infection and acquired panhypogammaglobulinemia      11/17/2016 -  Chemotherapy    The patient is started on Rituximab. Idelisib was started on 2/1      01/06/2017 PET scan    Significant partial treatment response. Persistent mild hypermetabolism within solitary right paratracheal and solitary right hilar lymph nodes,  which both demonstrate decreased metabolism. Otherwise complete metabolic response. Residual mildly enlarged bilateral neck, axillary, retroperitoneal and pelvic lymph nodes have all decreased in size and demonstrate no hypermetabolism. 2. Additional findings include aortic atherosclerosis, three-vessel coronary atherosclerosis and mosaic attenuation in both lungs most commonly due to air trapping from small airways disease      01/12/2017 Adverse Reaction    Idelisib is placed on hold due to neutropenia and resumed at reduced dose      05/13/2017 PET scan    1. Mild enlargement of nodal disease in the neck, chest, abdomen, and pelvis. Most of the nodes are slightly increased activity although the right hilar node demonstrates the opposite. Current nodal disease is primarily [place] 3 ; the right paratracheal and right hilar lymph nodes are [place] 4. 2. Aortic Atherosclerosis (ICD10-I70.0). 3. A large calcified paraesophageal lymph node exert some mass effect on the esophagus. 4. Minimal chronic left maxillary sinusitis.       INTERVAL HISTORY: Please see below for problem oriented charting. She returns with her husband for further follow-up She complain of bruising The patient denies any recent signs or symptoms of bleeding such as spontaneous epistaxis, hematuria or hematochezia. Her appetite is good No recent nausea vomiting No new lymphadenopathy She has completed recent treatment with antibiotics for UTI  REVIEW OF SYSTEMS:   Constitutional: Denies fevers, chills or abnormal weight loss  Eyes: Denies blurriness of vision Ears, nose, mouth, throat, and face: Denies mucositis or sore throat Respiratory: Denies cough, dyspnea or wheezes Cardiovascular: Denies palpitation, chest discomfort or lower extremity swelling Gastrointestinal:  Denies nausea, heartburn or change in bowel habits Skin: Denies abnormal skin rashes Lymphatics: Denies new lymphadenopathy Neurological:Denies  numbness, tingling or new weaknesses Behavioral/Psych: Mood is stable, no new changes  All other systems were reviewed with the patient and are negative.  I have reviewed the past medical history, past surgical history, social history and family history with the patient and they are unchanged from previous note.  ALLERGIES:  is allergic to bactrim [sulfamethoxazole w/trimethoprim (co-trimoxazole)].  MEDICATIONS:  Current Outpatient Prescriptions  Medication Sig Dispense Refill  . acyclovir (ZOVIRAX) 400 MG tablet Take 1 tablet (400 mg total) by mouth daily. 90 tablet 3  . amoxicillin (AMOXIL) 500 MG tablet Take 1 tablet (500 mg total) by mouth 2 (two) times daily. 14 tablet 0  . calcium citrate-vitamin D (CITRACAL+D) 315-200 MG-UNIT per tablet Take 1 tablet by mouth daily.     . cholecalciferol (VITAMIN D) 1000 units tablet Take 1,000 Units by mouth daily.    . diphenhydrAMINE (BENADRYL) 25 mg capsule Take 50 mg by mouth every 8 (eight) hours as needed for itching or allergies.     . diphenoxylate-atropine (LOMOTIL) 2.5-0.025 MG tablet Take 1 tablet by mouth 4 (four) times daily as needed for diarrhea or loose stools. 60 tablet 0  . ESTRACE VAGINAL 0.1 MG/GM vaginal cream Place 1 Applicatorful vaginally 2 (two) times a week.     . folic acid (FOLVITE) 1 MG tablet Take 1 mg by mouth daily.    Marland Kitchen glucosamine-chondroitin 500-400 MG tablet Take 1 tablet by mouth daily.      . idelalisib (ZYDELIG) 150 MG tablet Take 1 tablet (150 mg total) by mouth 2 (two) times daily. (Patient taking differently: Take 75 mg by mouth 2 (two) times daily. ) 60 tablet 11  . levETIRAcetam (KEPPRA XR) 500 MG 24 hr tablet TAKE 2 TABLETS BY MOUTH  DAILY (Patient taking differently: TAKE 2 TABLETS BY MOUTH  at night) 180 tablet 1  . lidocaine-prilocaine (EMLA) cream Apply 1 application topically as needed. (Patient taking differently: Apply 1 application topically as needed (for port access). ) 30 g 6  . loratadine  (CLARITIN) 10 MG tablet Take 10 mg by mouth daily.      . mirtazapine (REMERON) 15 MG tablet Take 1 tablet (15 mg total) by mouth at bedtime. 30 tablet 1  . montelukast (SINGULAIR) 10 MG tablet Take 10 mg by mouth daily.     . Multiple Vitamin (MULTIVITAMIN) tablet Take 1 tablet by mouth daily.      . ondansetron (ZOFRAN) 8 MG tablet Take 1 tablet (8 mg total) by mouth every 8 (eight) hours as needed for nausea. 30 tablet 3  . polyvinyl alcohol (ARTIFICIAL TEARS) 1.4 % ophthalmic solution Place 1 drop into both eyes 3 (three) times daily. Reported on 03/10/2016    . predniSONE (DELTASONE) 10 MG tablet Take 1 tablet (10 mg total) by mouth daily with breakfast. 30 tablet 1  . sodium chloride (OCEAN) 0.65 % SOLN nasal spray Place 1 spray into both nostrils 2 (two) times daily.      No current facility-administered medications for this visit.     PHYSICAL EXAMINATION: ECOG PERFORMANCE STATUS: 1 - Symptomatic but completely ambulatory  Vitals:   06/22/17 1332  BP: (!) 128/56  Pulse: 70  Resp: 18  Temp: 97.9 F (36.6 C)  SpO2: 99%   Filed Weights   06/22/17 1332  Weight: 105 lb (47.6 kg)    GENERAL:alert, no distress and comfortable SKIN: Noted significant bruising EYES: normal, Conjunctiva are pink and non-injected, sclera clear OROPHARYNX:no exudate, no erythema and lips, buccal mucosa, and tongue normal  NECK: supple, thyroid normal size, non-tender, without nodularity LYMPH:  no palpable lymphadenopathy in the cervical, axillary or inguinal LUNGS: clear to auscultation and percussion with normal breathing effort HEART: regular rate & rhythm and no murmurs and no lower extremity edema ABDOMEN:abdomen soft, non-tender and normal bowel sounds Musculoskeletal:no cyanosis of digits and no clubbing  NEURO: alert & oriented x 3 with fluent speech, no focal motor/sensory deficits  LABORATORY DATA:  I have reviewed the data as listed    Component Value Date/Time   NA 138 06/22/2017  1222   K 4.9 06/22/2017 1222   CL 102 03/16/2017 1721   CL 99 04/08/2013 0922   CO2 30 (H) 06/22/2017 1222   GLUCOSE 102 06/22/2017 1222   GLUCOSE 92 04/08/2013 0922   BUN 15.2 06/22/2017 1222   CREATININE 0.9 06/22/2017 1222   CALCIUM 10.2 06/22/2017 1222   PROT 5.7 (L) 06/22/2017 1222   ALBUMIN 3.9 06/22/2017 1222   AST 29 06/22/2017 1222   ALT 42 06/22/2017 1222   ALKPHOS 64 06/22/2017 1222   BILITOT 0.48 06/22/2017 1222   GFRNONAA >60 03/16/2017 1721   GFRAA >60 03/16/2017 1721    No results found for: SPEP, UPEP  Lab Results  Component Value Date   WBC 7.0 06/22/2017   NEUTROABS 2.4 06/22/2017   HGB 13.7 06/22/2017   HCT 41.6 06/22/2017   MCV 87.2 06/22/2017   PLT 108 (L) 06/22/2017      Chemistry      Component Value Date/Time   NA 138 06/22/2017 1222   K 4.9 06/22/2017 1222   CL 102 03/16/2017 1721   CL 99 04/08/2013 0922   CO2 30 (H) 06/22/2017 1222   BUN 15.2 06/22/2017 1222   CREATININE 0.9 06/22/2017 1222      Component Value Date/Time   CALCIUM 10.2 06/22/2017 1222   ALKPHOS 64 06/22/2017 1222   AST 29 06/22/2017 1222   ALT 42 06/22/2017 1222   BILITOT 0.48 06/22/2017 1222       ASSESSMENT & PLAN:  CLL (chronic lymphocytic leukemia) Unfortunately, the patient is in the transition of care and had relocated to [place]. CBC today show mild lymphocytosis, could be due to disease progression She has an appointment to establish care in [place] this week I have requested the patient to stay on low-dose prednisone along with Idelisib She may need repeat imaging study again before starting her on new treatment Alternatively, she can be observed closely until there is a need to restart treatment Her CBC show mild thrombocytopenia but overall she is not symptomatic except for bruising which I think is related to prednisone. Previously, she have responded well to anti-CD20 treatment It is conceivable she can be retreated with rituximab or Obinutuzumab  again in the future but I would defer to her new oncologist to discuss this with her   Thrombocytopenia ([place]) She is not symptomatic apart from minor bruising. I will observe for now. This is likely due to recent treatment and her CLL  Elevated liver enzymes Her recent elevated liver enzymes are likely related to recent chemotherapy Repeat blood work showed it has improved She will continue low-dose chemotherapy until she is  established with new oncologist    No orders of the defined types were placed in this encounter.  All questions were answered. The patient knows to call the clinic with any problems, questions or concerns. No barriers to learning was detected. I spent 15 minutes counseling the patient face to face. The total time spent in the appointment was 20 minutes and more than 50% was on counseling and review of test results     Heath Lark, MD 06/23/2017 9:05 AM

## 2017-07-07 ENCOUNTER — Other Ambulatory Visit: Payer: Self-pay | Admitting: Hematology and Oncology

## 2017-07-13 ENCOUNTER — Other Ambulatory Visit: Payer: Self-pay | Admitting: Neurology

## 2017-08-16 ENCOUNTER — Other Ambulatory Visit: Payer: Self-pay | Admitting: Hematology and Oncology

## 2017-08-18 IMAGING — CT NM PET TUM IMG INITIAL (PI) SKULL BASE T - THIGH
8 of 10 series · 18 of 25 positions shown · non-contrast
Comparison: None.

CLINICAL DATA: Initial treatment strategy for chronic lymphocytic
leukemia.

EXAM:
NUCLEAR MEDICINE PET SKULL BASE TO THIGH
TECHNIQUE: 6.85 mCi F-18 FDG was injected intravenously. Full-ring PET imaging
was performed from the skull base to thigh after the radiotracer. CT
data was obtained and used for attenuation correction and anatomic
localization.
FASTING BLOOD GLUCOSE:  Value: 94 mg/dl

[Series 2: ac ct sk_thigh 5.0 hd_fov · axial · 5.0mm · 1.52mm/px · z∈[-766,+14]mm · 3 of 196 slices shown]
[im 1/196]
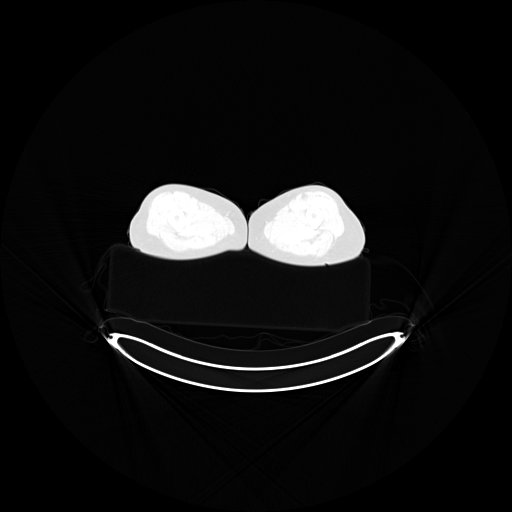
[im 131/196]
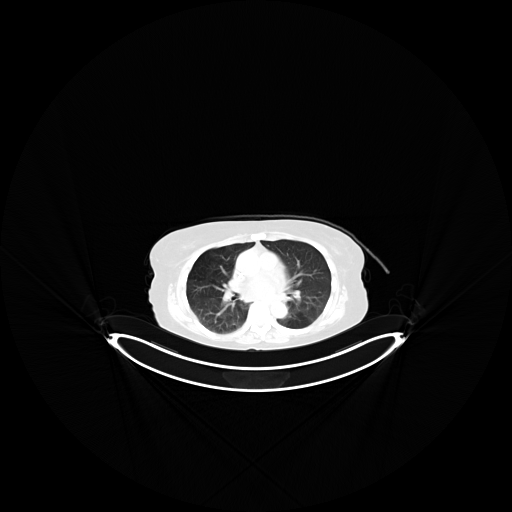
[im 196/196]
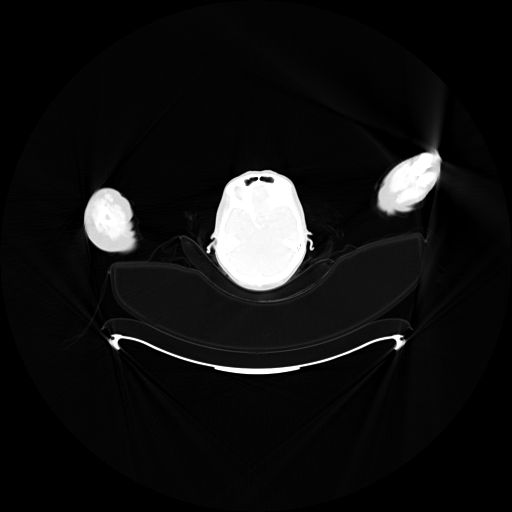

[Series 3: pet sk_thigh ac · axial · 5.0mm · 4.07mm/px · z∈[-766,+14]mm · 3 of 196 slices shown]
[im 1/196]
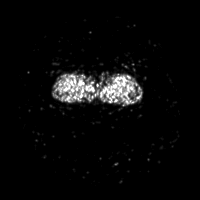
[im 131/196]
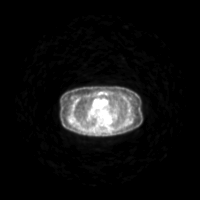
[im 196/196]
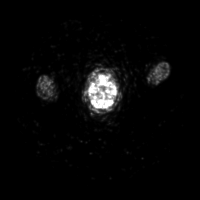

[Series 4: ct sk_thigh 5.0 b31f · axial · 5.0mm · 0.87mm/px · z∈[-506,+14]mm · 3 of 196 slices shown]
[im 66/196]
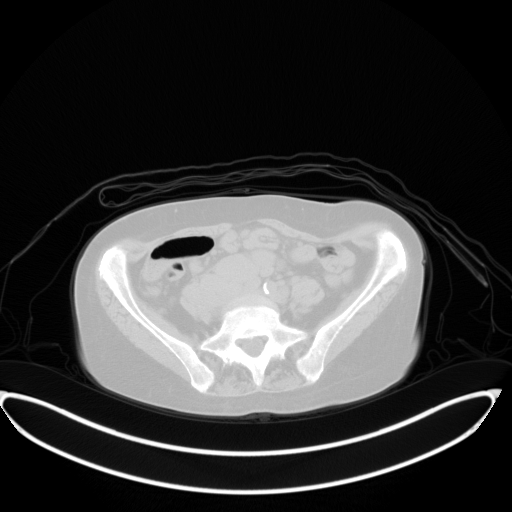
[im 131/196]
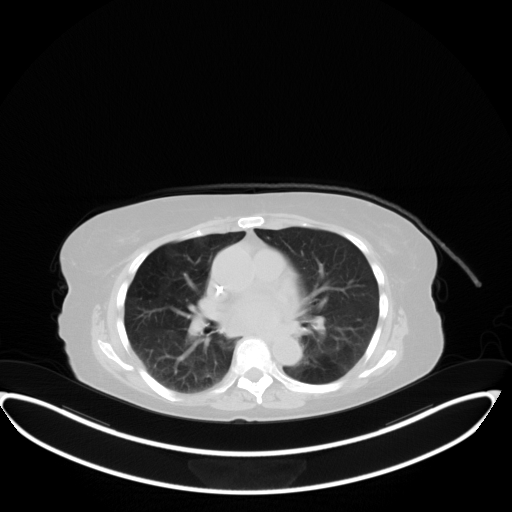
[im 196/196  brain]
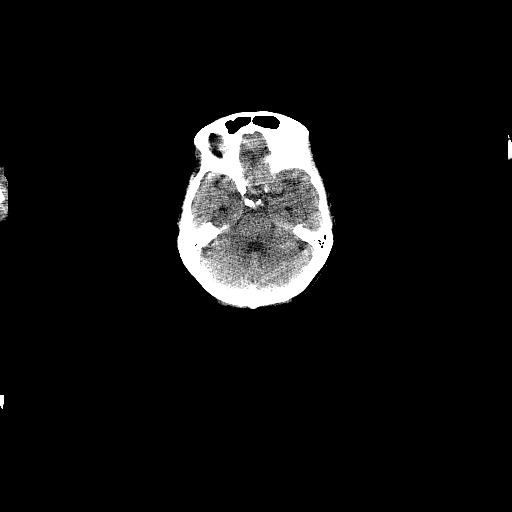

[Series 7: pet sk_thigh nac · axial · 5.0mm · 4.07mm/px · z∈[-506,+14]mm · 3 of 196 slices shown]
[im 66/196]
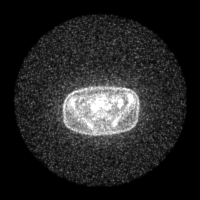
[im 131/196]
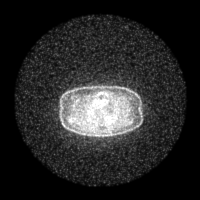
[im 196/196]
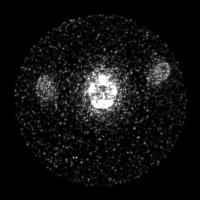

[Series 604: range-ct sk_thigh 5.0 (id)<alpha range> · 1 of 68 slices shown (1 of 2)]
[im 1/68]
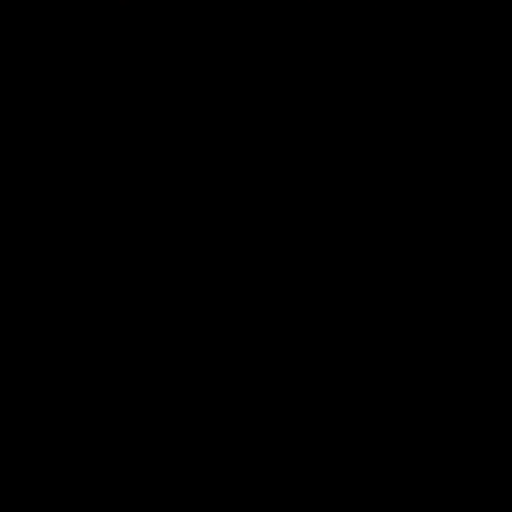

[Series 605: mip collection · coronal · 1.68mm/px · 1 of 32 slices shown]
[im 1/32]
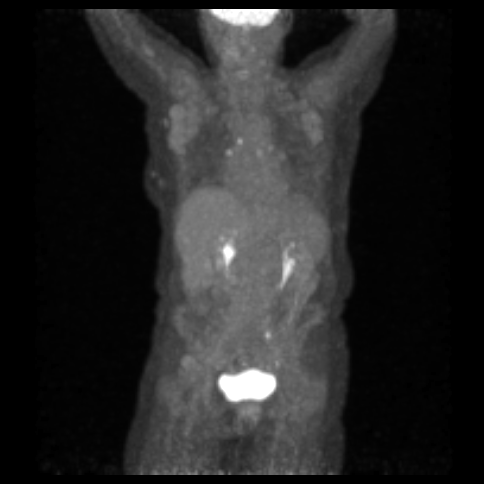

[Series 606: range-ct sk_thigh 5.0 (id)<alpha range> · 3 of 192 slices shown (2 of 2)]
[im 64/192]
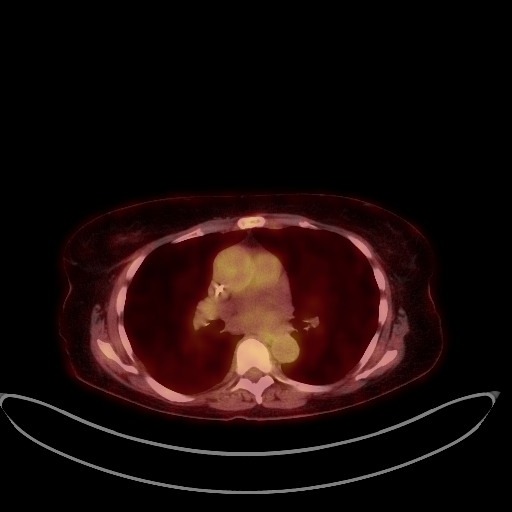
[im 128/192]
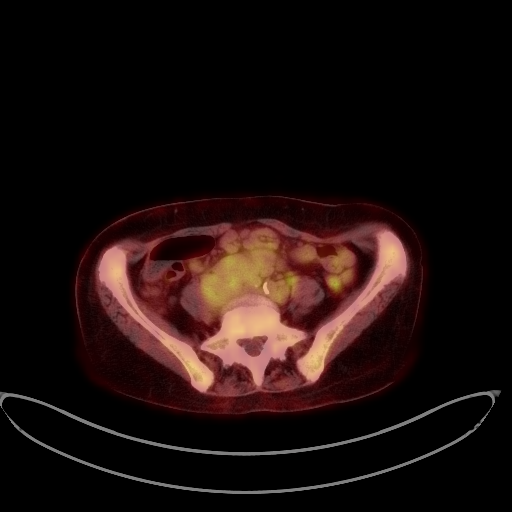
[im 192/192]
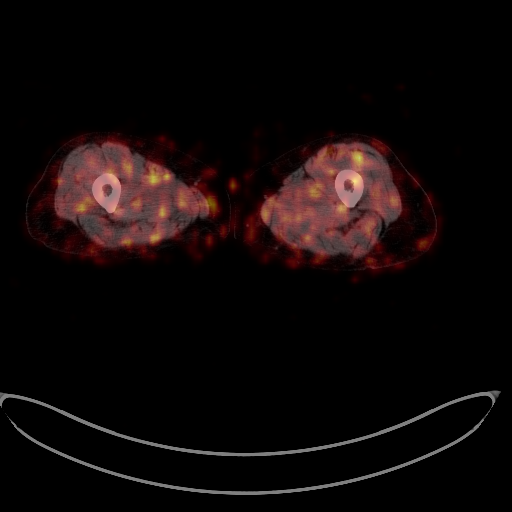

[Series 1070: results mm oncology reading · 0.52mm/px · 1 of 2 slices shown]
[im 1/2]
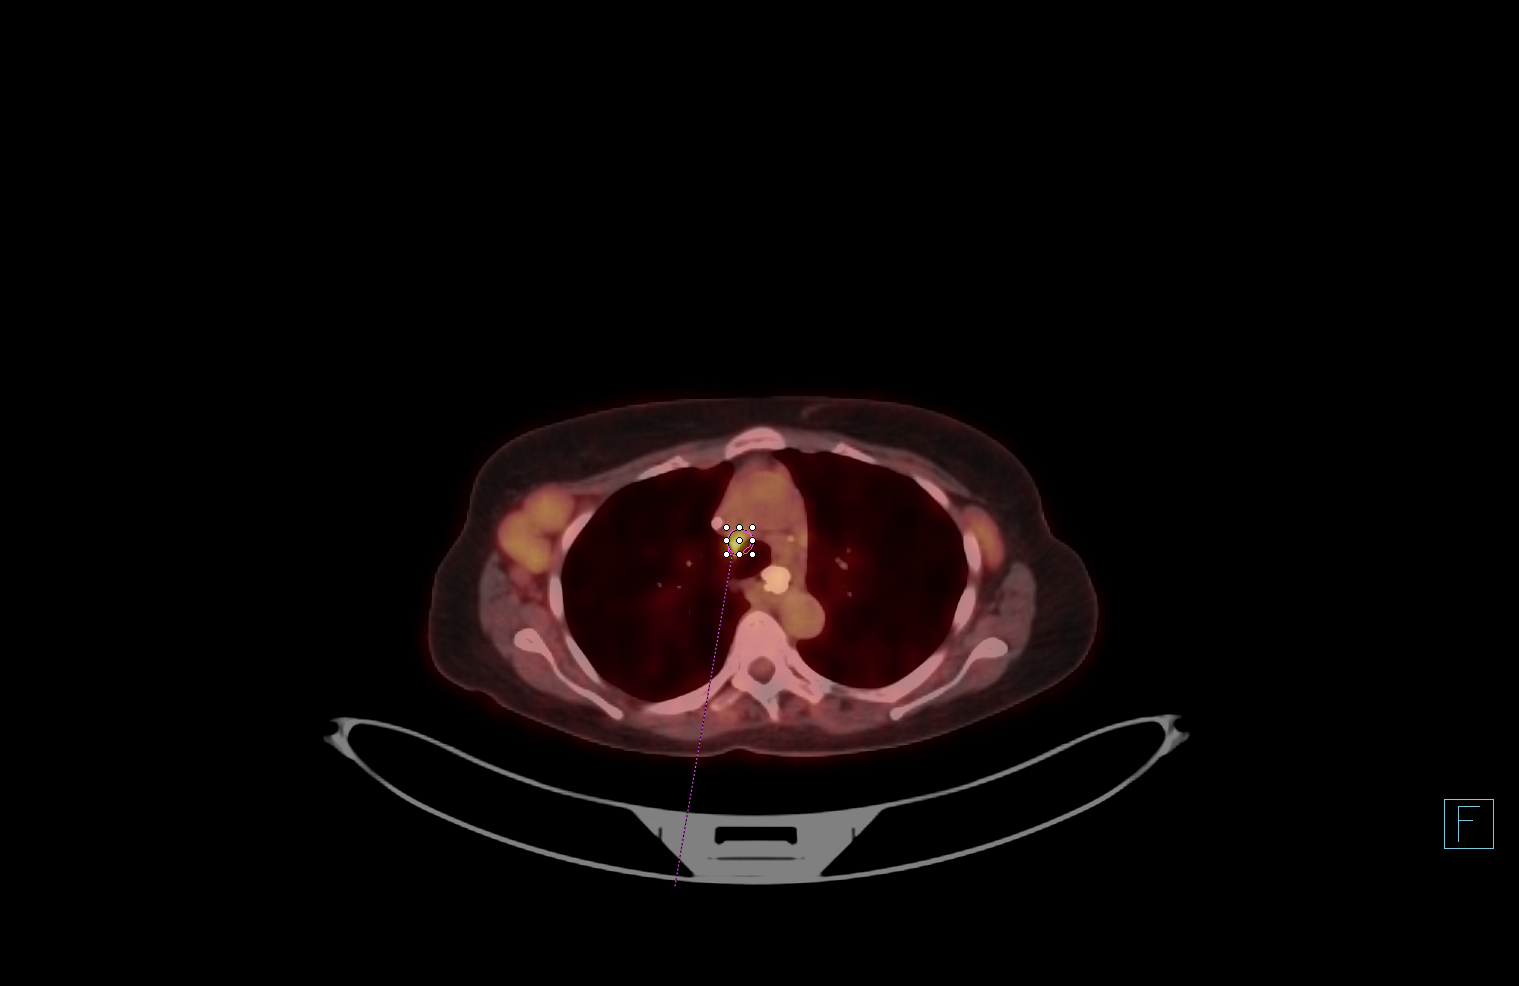

[18 of 25 positions shown; findings below may reference images not displayed]

FINDINGS: NECK

Prominent bilateral cervical lymph nodes are identified. Index right
level 3 lymph node measures 1 cm and has an SUV max equal to 2.8,
image 30 of series 4. Right level 3 lymph node measures 1 cm and has
an SUV max equal to 3.13.

CHEST

Bulky bilateral axillary adenopathy is identified index right
axillary node measures 2.8 cm and has an SUV max equal to 3.46,
image 52 of series 4 peer left axillary lymph node measures 1.5 cm
equal to 3.2. Hypermetabolic mediastinal and hilar lymph nodes are
identified. Index right paratracheal node measures 8 mm and has an
SUV max equal to 6.38. SUV max associated with the right hilar
region is equal to 5.58. Enlarged and hypermetabolic left internal
mammary lymph nodes identified. Index left internal mammary lymph
node measures 8 mm and has an SUV max equal to 2.57.

There is no pleural fluid identified. No suspicious nodules
identified.

ABDOMEN/PELVIS

No abnormal uptake identified within the liver or pancreas. The
adrenal glands are unremarkable. The kidneys appear normal. No mass
or hydronephrosis involving the kidneys. The urinary bladder is
unremarkable. The spleen measures 10 cm in length. No focal areas of
abnormal increased uptake identified within the spleen.

Bulky adenopathy is identified within the abdomen and pelvis. Index
periaortic lymph node measures 2.5 cm and has an SUV max equal to
3.2 just above the aortic bifurcation there is an aortocaval node
measuring 2.5 cm with an SUV max equal to 3.5. Bilateral iliac
adenopathy is identified. Index right common iliac node measures
cm and has an SUV max equal to 3.44. Index left pelvic sidewall
lymph node measures 2.3 cm and has an SUV max equal to 3.55. Right
pelvic sidewall lymph node Measures 2.3 cm and has an SUV max equal
to 3.7. Enlarged bilateral inguinal lymph nodes are identified.
Right inguinal node measures 2.2 cm and has an SUV max equal to 3.8.

SKELETON

No focal hypermetabolic activity to suggest skeletal metastasis.
IMPRESSION: 1. Examination is positive for bulky adenopathy within the neck,
chest, abdomen and pelvis compatible with the clinical history of
chronic lymphocytic leukemia.

## 2017-09-14 ENCOUNTER — Other Ambulatory Visit: Payer: Self-pay | Admitting: Neurology

## 2017-12-03 ENCOUNTER — Telehealth: Payer: Self-pay | Admitting: Neurology

## 2017-12-03 MED ORDER — LEVETIRACETAM ER 500 MG PO TB24: 1000.0000 mg | ORAL_TABLET | Freq: Every day | ORAL | 1 refills | Status: DC

## 2017-12-03 NOTE — Telephone Encounter (Signed)
Pt called and wanted to give you the number to [facility] Fax# [phone]

## 2017-12-03 NOTE — Telephone Encounter (Signed)
Spoke with patient and made her aware that we could send in refills for 6 months, but she needs to establish care in [place]. She has yet to establish care at this time.  Refills sent in, but patient aware we can not refill when these are done.

## 2017-12-03 NOTE — Telephone Encounter (Signed)
Pt said that [place] has moved out of [place] and Dr Tat was going to cover her refills and wanted to give you the pharmacy fax number where her prescriptions can be faxed now,  I called pt to find out if they needed any prescriptions refilled now and pt needs a 90 day supply of Keppra

## 2017-12-03 NOTE — Telephone Encounter (Signed)
Did she need something?

## 2018-01-01 IMAGING — CR DG CHEST 2V
2 series · 2 of 2 positions shown · non-contrast
Comparison: 10/28/2013 chest x-ray.  01/06/2017 chest CT.

CLINICAL DATA: 84-year-old female with chronic lymphocytic leukemia
with cough. Initial encounter.

EXAM:
CHEST  2 VIEW

[w chest pa]
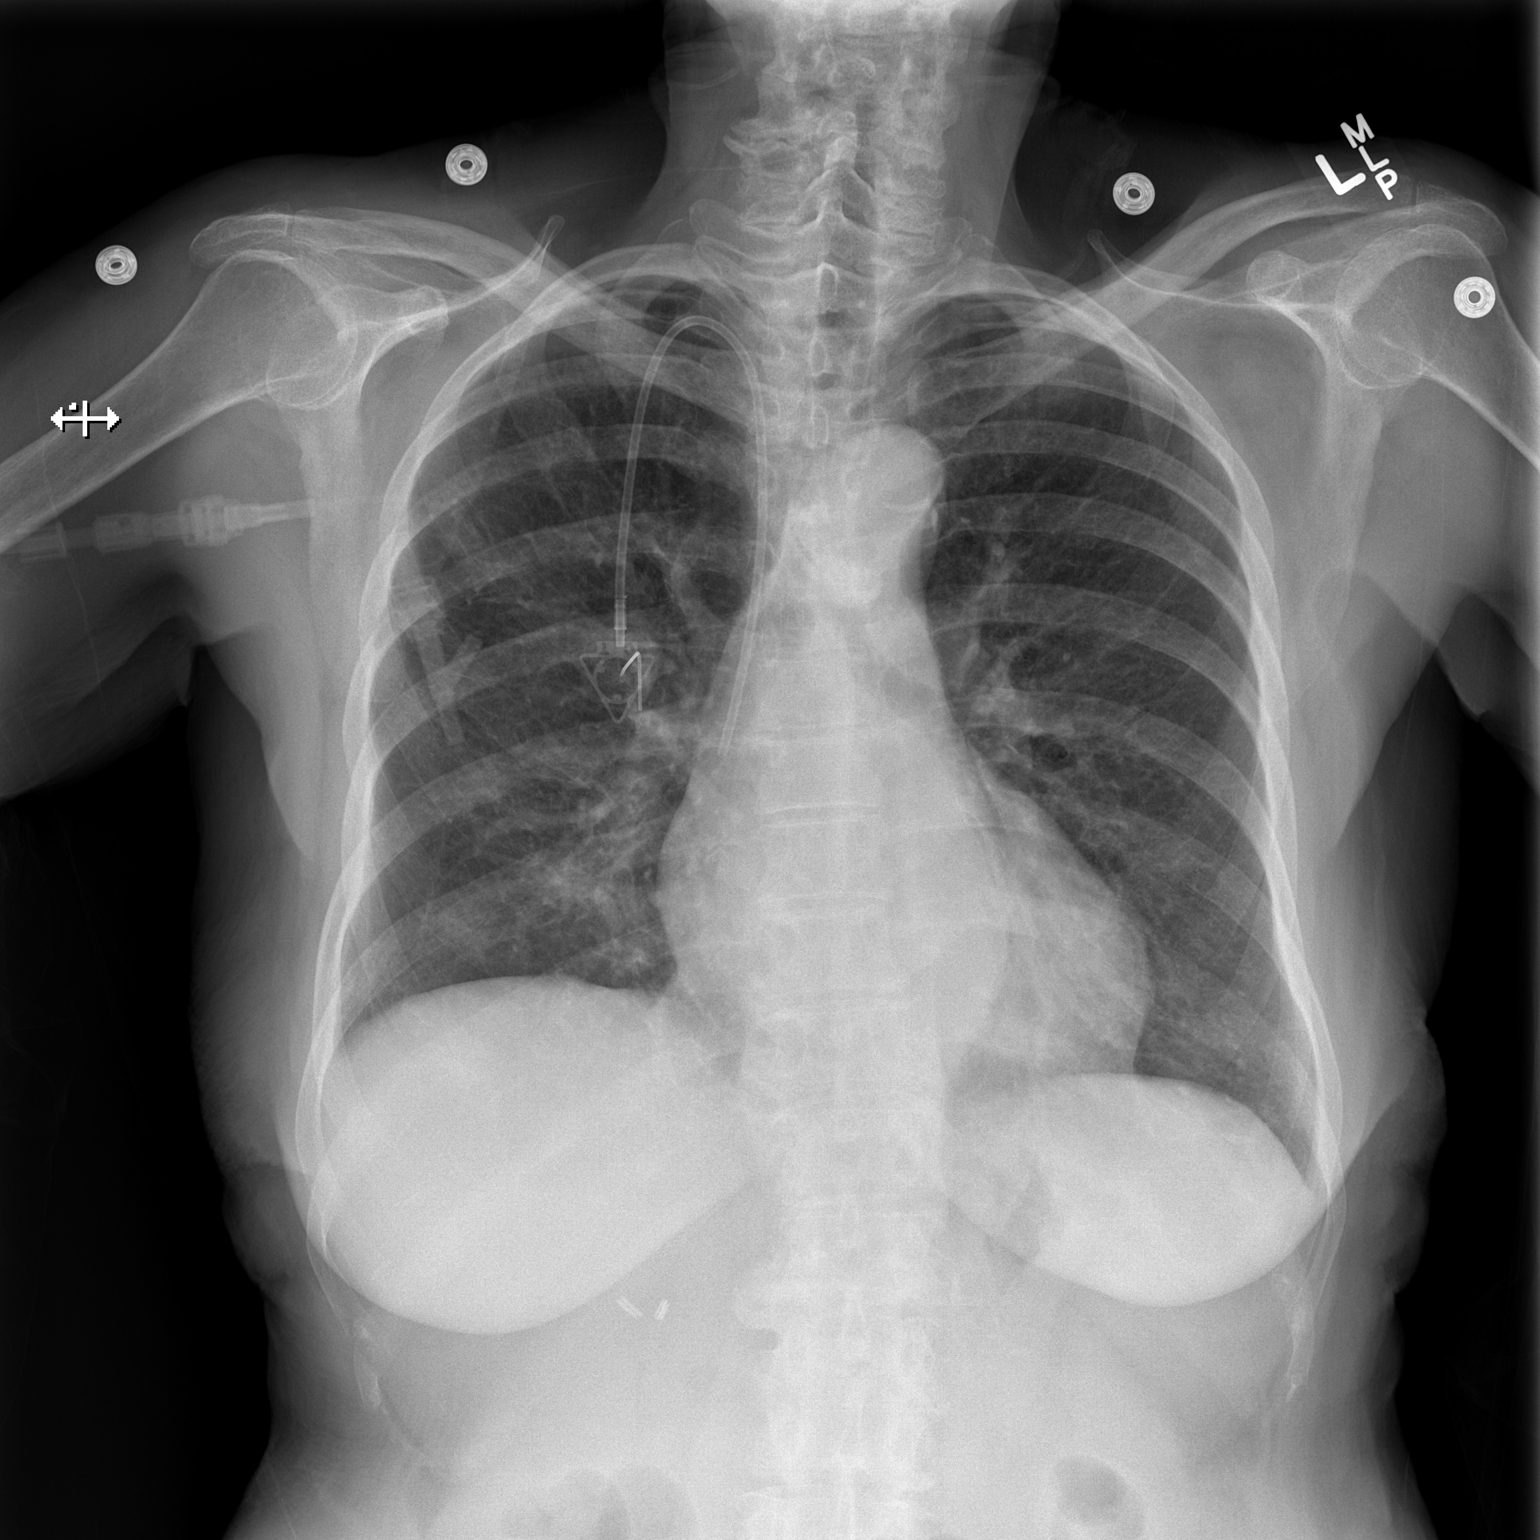

[w chest lat]
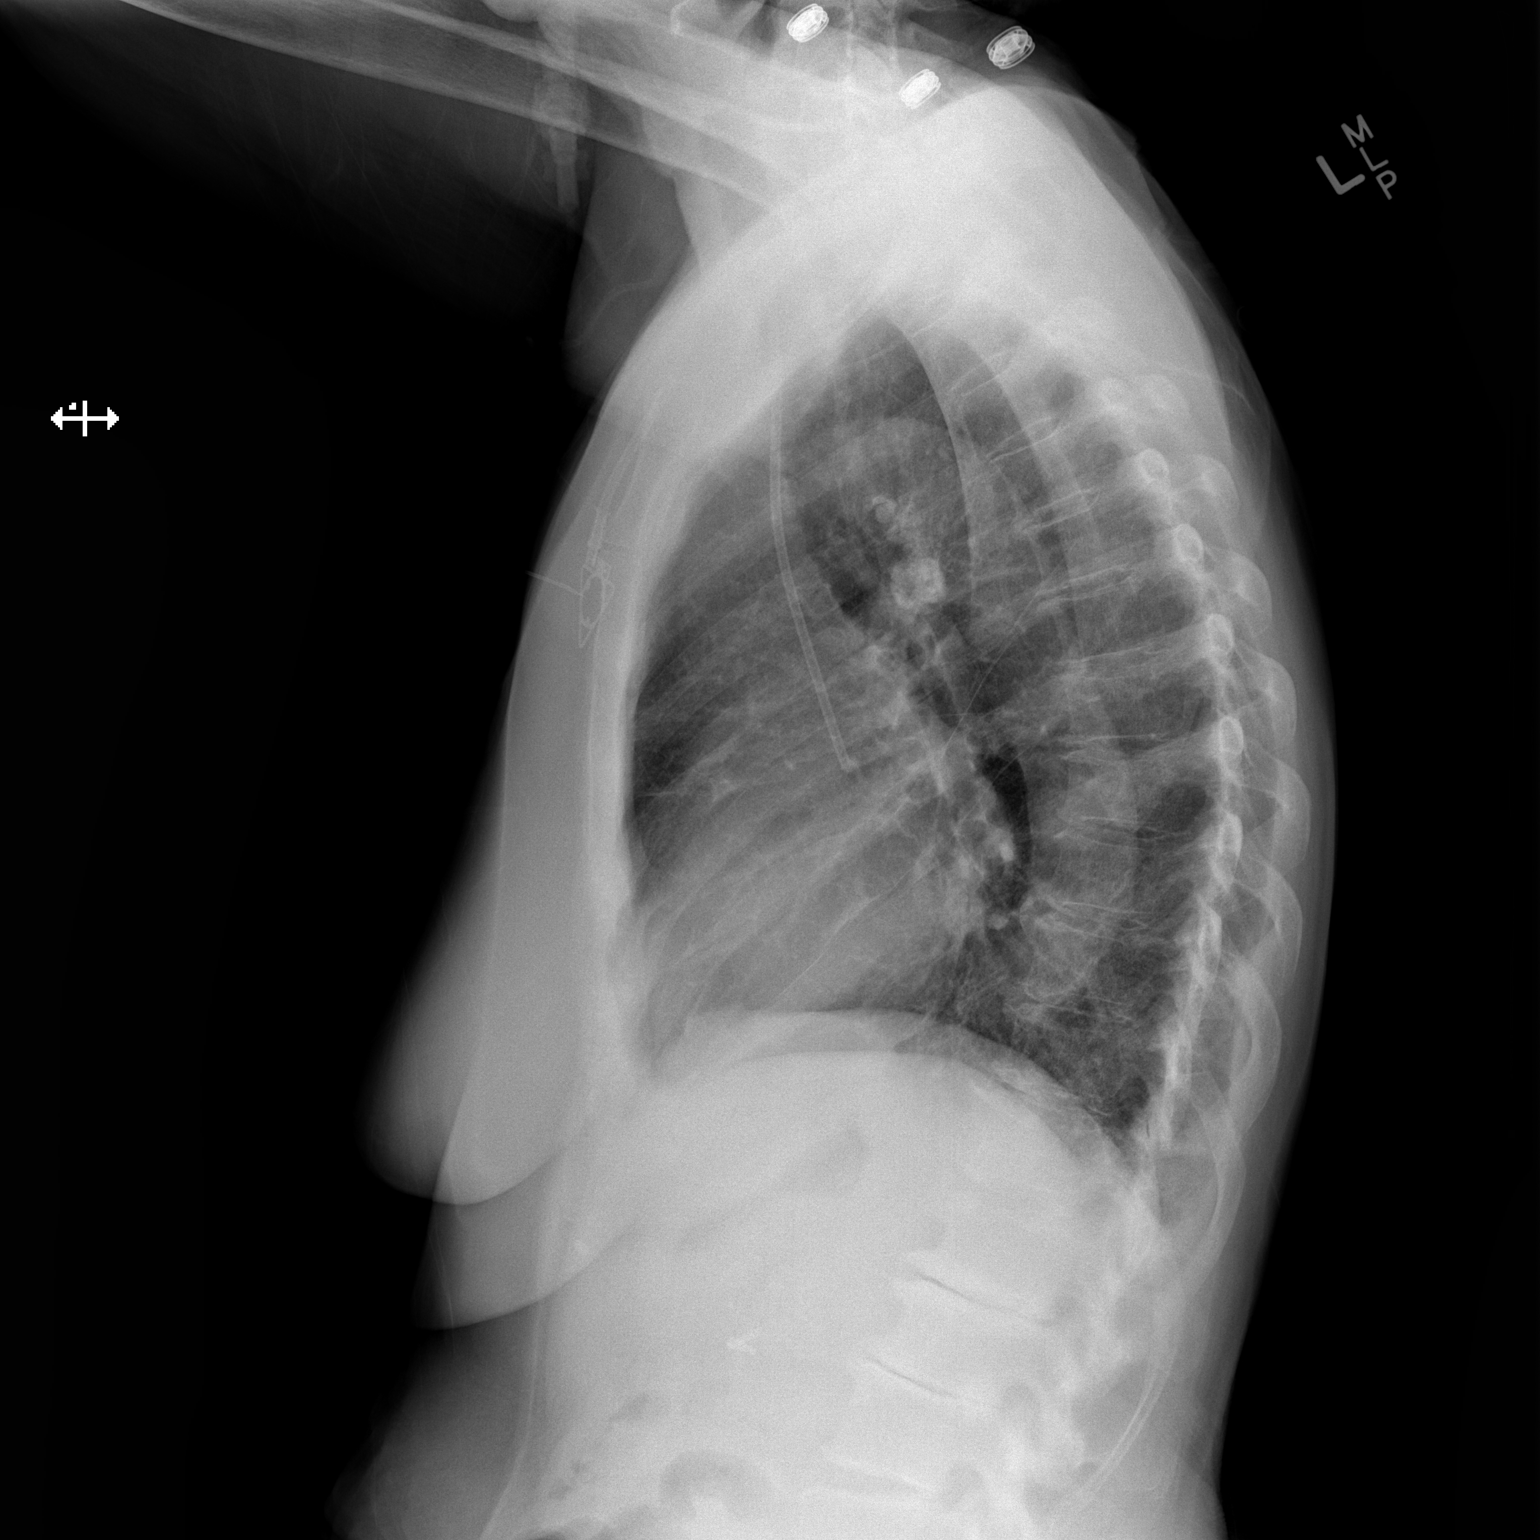

[2 of 2 positions shown; findings below may reference images not displayed]

FINDINGS: Chronic lung changes without segmental consolidation noted.

Calcified mediastinal lymph nodes.

MediPort catheter tip distal superior vena cava.

Calcified mildly tortuous aorta.

Heart size within normal limits.

Degenerative changes upper lumbar spine. No acute osseous
abnormality. Degenerative changes cervical spine.
IMPRESSION: Chronic lung changes without segmental consolidation.

Calcified mediastinal lymph nodes.

Calcified tortuous aorta.

## 2018-01-30 ENCOUNTER — Other Ambulatory Visit: Payer: Self-pay | Admitting: Hematology and Oncology

## 2018-01-30 DIAGNOSIS — C9110 Chronic lymphocytic leukemia of B-cell type not having achieved remission: Secondary | ICD-10-CM

## 2018-01-30 DIAGNOSIS — D696 Thrombocytopenia, unspecified: Secondary | ICD-10-CM

## 2018-02-03 ENCOUNTER — Other Ambulatory Visit: Payer: Self-pay | Admitting: Hematology and Oncology

## 2018-02-03 DIAGNOSIS — D696 Thrombocytopenia, unspecified: Secondary | ICD-10-CM

## 2018-02-03 DIAGNOSIS — C9110 Chronic lymphocytic leukemia of B-cell type not having achieved remission: Secondary | ICD-10-CM

## 2018-07-28 ENCOUNTER — Other Ambulatory Visit: Payer: Self-pay | Admitting: Neurology

## 2022-08-26 ENCOUNTER — Other Ambulatory Visit (HOSPITAL_COMMUNITY): Payer: Self-pay

## 2024-04-03 DEATH — deceased
